# Patient Record
Sex: Male | Born: 1940
Health system: Southern US, Community
[De-identification: ages and names within clinical notes are randomized; demographics above are authoritative.]

## PROBLEM LIST (undated history)

## (undated) DIAGNOSIS — K219 Gastro-esophageal reflux disease without esophagitis: Secondary | ICD-10-CM

## (undated) DIAGNOSIS — M1711 Unilateral primary osteoarthritis, right knee: Secondary | ICD-10-CM

## (undated) DIAGNOSIS — C649 Malignant neoplasm of unspecified kidney, except renal pelvis: Secondary | ICD-10-CM

## (undated) DIAGNOSIS — E119 Type 2 diabetes mellitus without complications: Secondary | ICD-10-CM

## (undated) DIAGNOSIS — I219 Acute myocardial infarction, unspecified: Secondary | ICD-10-CM

## (undated) DIAGNOSIS — K5790 Diverticulosis of intestine, part unspecified, without perforation or abscess without bleeding: Secondary | ICD-10-CM

## (undated) DIAGNOSIS — G629 Polyneuropathy, unspecified: Secondary | ICD-10-CM

## (undated) DIAGNOSIS — Z7901 Long term (current) use of anticoagulants: Secondary | ICD-10-CM

## (undated) DIAGNOSIS — I480 Paroxysmal atrial fibrillation: Secondary | ICD-10-CM

## (undated) DIAGNOSIS — I499 Cardiac arrhythmia, unspecified: Secondary | ICD-10-CM

## (undated) DIAGNOSIS — Z9289 Personal history of other medical treatment: Secondary | ICD-10-CM

## (undated) DIAGNOSIS — M1990 Unspecified osteoarthritis, unspecified site: Secondary | ICD-10-CM

## (undated) DIAGNOSIS — I10 Essential (primary) hypertension: Secondary | ICD-10-CM

## (undated) DIAGNOSIS — I2510 Atherosclerotic heart disease of native coronary artery without angina pectoris: Secondary | ICD-10-CM

## (undated) DIAGNOSIS — M779 Enthesopathy, unspecified: Secondary | ICD-10-CM

## (undated) DIAGNOSIS — M1712 Unilateral primary osteoarthritis, left knee: Secondary | ICD-10-CM

## (undated) DIAGNOSIS — E785 Hyperlipidemia, unspecified: Secondary | ICD-10-CM

## (undated) DIAGNOSIS — D649 Anemia, unspecified: Secondary | ICD-10-CM

## (undated) DIAGNOSIS — J189 Pneumonia, unspecified organism: Secondary | ICD-10-CM

## (undated) DIAGNOSIS — I214 Non-ST elevation (NSTEMI) myocardial infarction: Secondary | ICD-10-CM

## (undated) DIAGNOSIS — R0602 Shortness of breath: Secondary | ICD-10-CM

## (undated) HISTORY — DX: Unilateral primary osteoarthritis, left knee: M17.12

## (undated) HISTORY — PX: OTHER SURGICAL HISTORY: SHX169

## (undated) HISTORY — DX: Hyperlipidemia, unspecified: E78.5

## (undated) HISTORY — PX: PILONIDAL CYST DRAINAGE: SHX743

## (undated) HISTORY — PX: JOINT REPLACEMENT: SHX530

## (undated) HISTORY — DX: Unspecified osteoarthritis, unspecified site: M19.90

---
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
## 1995-05-04 DIAGNOSIS — I219 Acute myocardial infarction, unspecified: Secondary | ICD-10-CM

## 1995-05-04 HISTORY — PX: CORONARY ANGIOPLASTY WITH STENT PLACEMENT: SHX49

## 1995-05-04 HISTORY — DX: Acute myocardial infarction, unspecified: I21.9

## 1995-05-04 HISTORY — PX: TOTAL SHOULDER REPLACEMENT: SUR1217

## 1999-04-16 ENCOUNTER — Ambulatory Visit (HOSPITAL_BASED_OUTPATIENT_CLINIC_OR_DEPARTMENT_OTHER): Admission: RE | Admit: 1999-04-16 | Discharge: 1999-04-16 | Payer: Self-pay | Admitting: General Surgery

## 2000-03-14 ENCOUNTER — Encounter: Payer: Self-pay | Admitting: Orthopedic Surgery

## 2000-03-16 ENCOUNTER — Encounter: Payer: Self-pay | Admitting: Orthopedic Surgery

## 2000-03-16 ENCOUNTER — Inpatient Hospital Stay (HOSPITAL_COMMUNITY): Admission: RE | Admit: 2000-03-16 | Discharge: 2000-03-17 | Payer: Self-pay | Admitting: Orthopedic Surgery

## 2001-07-20 ENCOUNTER — Ambulatory Visit (HOSPITAL_COMMUNITY): Admission: RE | Admit: 2001-07-20 | Discharge: 2001-07-20 | Payer: Self-pay | Admitting: Gastroenterology

## 2003-12-09 ENCOUNTER — Ambulatory Visit (HOSPITAL_COMMUNITY): Admission: RE | Admit: 2003-12-09 | Discharge: 2003-12-09 | Payer: Self-pay | Admitting: Orthopedic Surgery

## 2003-12-09 ENCOUNTER — Ambulatory Visit (HOSPITAL_BASED_OUTPATIENT_CLINIC_OR_DEPARTMENT_OTHER): Admission: RE | Admit: 2003-12-09 | Discharge: 2003-12-09 | Payer: Self-pay | Admitting: Orthopedic Surgery

## 2003-12-16 ENCOUNTER — Ambulatory Visit (HOSPITAL_COMMUNITY): Admission: RE | Admit: 2003-12-16 | Discharge: 2003-12-16 | Payer: Self-pay | Admitting: Orthopedic Surgery

## 2009-05-03 HISTORY — PX: CARDIAC CATHETERIZATION: SHX172

## 2009-05-03 HISTORY — PX: LUNG BIOPSY: SHX232

## 2009-07-18 ENCOUNTER — Encounter: Admission: RE | Admit: 2009-07-18 | Discharge: 2009-07-18 | Payer: Self-pay | Admitting: Internal Medicine

## 2009-08-13 ENCOUNTER — Ambulatory Visit (HOSPITAL_COMMUNITY): Admission: RE | Admit: 2009-08-13 | Discharge: 2009-08-13 | Payer: Self-pay | Admitting: Urology

## 2009-08-14 ENCOUNTER — Inpatient Hospital Stay (HOSPITAL_COMMUNITY): Admission: RE | Admit: 2009-08-14 | Discharge: 2009-08-19 | Payer: Self-pay | Admitting: Urology

## 2009-08-14 ENCOUNTER — Ambulatory Visit: Payer: Self-pay | Admitting: Surgery

## 2009-08-14 ENCOUNTER — Encounter: Payer: Self-pay | Admitting: Urology

## 2009-08-14 HISTORY — PX: NEPHRECTOMY: SHX65

## 2009-11-17 ENCOUNTER — Ambulatory Visit (HOSPITAL_COMMUNITY): Admission: RE | Admit: 2009-11-17 | Discharge: 2009-11-17 | Payer: Self-pay | Admitting: Urology

## 2010-03-05 ENCOUNTER — Ambulatory Visit: Payer: Self-pay | Admitting: Oncology

## 2010-03-23 LAB — URINALYSIS, MICROSCOPIC - CHCC
Bilirubin (Urine): NEGATIVE
Glucose: NEGATIVE g/dL
Ketones: NEGATIVE mg/dL
Specific Gravity, Urine: 1.02 (ref 1.003–1.035)
WBC, UA: NEGATIVE (ref 0–2)

## 2010-05-11 ENCOUNTER — Encounter
Admission: RE | Admit: 2010-05-11 | Discharge: 2010-05-11 | Payer: Self-pay | Source: Home / Self Care | Attending: Oncology | Admitting: Oncology

## 2010-05-12 ENCOUNTER — Ambulatory Visit: Payer: Self-pay | Admitting: Oncology

## 2010-05-22 ENCOUNTER — Other Ambulatory Visit: Payer: Self-pay | Admitting: Dermatology

## 2010-05-26 ENCOUNTER — Ambulatory Visit
Admission: RE | Admit: 2010-05-26 | Discharge: 2010-05-26 | Payer: Self-pay | Source: Home / Self Care | Attending: Oncology | Admitting: Oncology

## 2010-05-28 ENCOUNTER — Ambulatory Visit (HOSPITAL_COMMUNITY)
Admission: RE | Admit: 2010-05-28 | Discharge: 2010-05-29 | Payer: Self-pay | Source: Home / Self Care | Attending: Interventional Radiology | Admitting: Interventional Radiology

## 2010-05-28 LAB — CBC
HCT: 38.2 % — ABNORMAL LOW (ref 39.0–52.0)
Hemoglobin: 13.2 g/dL (ref 13.0–17.0)
MCV: 87.8 fL (ref 78.0–100.0)
RBC: 4.35 MIL/uL (ref 4.22–5.81)
WBC: 6 10*3/uL (ref 4.0–10.5)

## 2010-05-28 LAB — PROTIME-INR: INR: 1.03 (ref 0.00–1.49)

## 2010-05-28 LAB — APTT: aPTT: 35 seconds (ref 24–37)

## 2010-07-21 LAB — BASIC METABOLIC PANEL
CO2: 28 mEq/L (ref 19–32)
CO2: 28 mEq/L (ref 19–32)
Calcium: 7.2 mg/dL — ABNORMAL LOW (ref 8.4–10.5)
Calcium: 7.3 mg/dL — ABNORMAL LOW (ref 8.4–10.5)
Creatinine, Ser: 1.19 mg/dL (ref 0.4–1.5)
GFR calc Af Amer: >60 mL/min (ref >60)
GFR calc Af Amer: >60 mL/min (ref >60)
GFR calc non Af Amer: >60 mL/min (ref >60)
GFR calc non Af Amer: >60 mL/min (ref >60)
Glucose, Bld: 126 mg/dL — ABNORMAL HIGH (ref 70–99)
Potassium: 3.9 mEq/L (ref 3.5–5.1)
Sodium: 134 mEq/L — ABNORMAL LOW (ref 135–145)
Sodium: 138 mEq/L (ref 135–145)

## 2010-07-21 LAB — CBC
HCT: 24.9 % — ABNORMAL LOW (ref 39.0–52.0)
Hemoglobin: 8.1 g/dL — ABNORMAL LOW (ref 13.0–17.0)
Hemoglobin: 8.8 g/dL — ABNORMAL LOW (ref 13.0–17.0)
Hemoglobin: 9.3 g/dL — ABNORMAL LOW (ref 13.0–17.0)
MCHC: 32.5 g/dL (ref 30.0–36.0)
MCHC: 33 g/dL (ref 30.0–36.0)
MCV: 82.2 fL (ref 78.0–100.0)
RBC: 3.01 MIL/uL — ABNORMAL LOW (ref 4.22–5.81)
RBC: 3.28 MIL/uL — ABNORMAL LOW (ref 4.22–5.81)
RBC: 3.47 MIL/uL — ABNORMAL LOW (ref 4.22–5.81)
RDW: 17.4 % — ABNORMAL HIGH (ref 11.5–15.5)
RDW: 17.6 % — ABNORMAL HIGH (ref 11.5–15.5)
RDW: 17.7 % — ABNORMAL HIGH (ref 11.5–15.5)

## 2010-07-22 LAB — COMPREHENSIVE METABOLIC PANEL
ALT: 58 U/L — ABNORMAL HIGH (ref 0–53)
AST: 24 U/L (ref 0–37)
Albumin: 2.6 g/dL — ABNORMAL LOW (ref 3.5–5.2)
CO2: 27 mEq/L (ref 19–32)
Calcium: 9.3 mg/dL (ref 8.4–10.5)
Creatinine, Ser: 0.79 mg/dL (ref 0.4–1.5)
GFR calc Af Amer: >60 mL/min (ref >60)
GFR calc non Af Amer: >60 mL/min (ref >60)
Sodium: 138 mEq/L (ref 135–145)
Total Protein: 7.7 g/dL (ref 6.0–8.3)

## 2010-07-22 LAB — TYPE AND SCREEN

## 2010-07-22 LAB — CBC
Hemoglobin: 10.9 g/dL — ABNORMAL LOW (ref 13.0–17.0)
MCHC: 32.3 g/dL (ref 30.0–36.0)
MCHC: 32.9 g/dL (ref 30.0–36.0)
MCV: 79 fL (ref 78.0–100.0)
MCV: 81.8 fL (ref 78.0–100.0)
Platelets: 465 10*3/uL — ABNORMAL HIGH (ref 150–400)
RBC: 4.04 MIL/uL — ABNORMAL LOW (ref 4.22–5.81)
RDW: 17.9 % — ABNORMAL HIGH (ref 11.5–15.5)

## 2010-07-22 LAB — BASIC METABOLIC PANEL
CO2: 25 mEq/L (ref 19–32)
Calcium: 7.4 mg/dL — ABNORMAL LOW (ref 8.4–10.5)
Chloride: 105 mEq/L (ref 96–112)
GFR calc Af Amer: >60 mL/min (ref >60)
Potassium: 4.8 mEq/L (ref 3.5–5.1)
Sodium: 135 mEq/L (ref 135–145)

## 2010-07-22 LAB — POCT I-STAT 4, (NA,K, GLUC, HGB,HCT)
Glucose, Bld: 147 mg/dL — ABNORMAL HIGH (ref 70–99)
HCT: 28 % — ABNORMAL LOW (ref 39.0–52.0)
Sodium: 136 mEq/L (ref 135–145)

## 2010-07-22 LAB — ABO/RH: ABO/RH(D): A POS

## 2010-08-20 ENCOUNTER — Ambulatory Visit (HOSPITAL_COMMUNITY)
Admission: RE | Admit: 2010-08-20 | Discharge: 2010-08-20 | Disposition: A | Discharge status: Home / Self Care | Payer: Medicare Other | Source: Ambulatory Visit

## 2010-08-20 ENCOUNTER — Other Ambulatory Visit: Payer: Self-pay

## 2010-08-20 ENCOUNTER — Ambulatory Visit (HOSPITAL_COMMUNITY)
Admission: RE | Admit: 2010-08-20 | Discharge: 2010-08-20 | Disposition: A | Discharge status: Home / Self Care | Payer: Medicare Other | Source: Ambulatory Visit | Attending: Internal Medicine | Admitting: Internal Medicine

## 2010-08-20 DIAGNOSIS — C801 Malignant (primary) neoplasm, unspecified: Secondary | ICD-10-CM | POA: Insufficient documentation

## 2010-08-20 DIAGNOSIS — I080 Rheumatic disorders of both mitral and aortic valves: Secondary | ICD-10-CM | POA: Insufficient documentation

## 2010-08-20 DIAGNOSIS — Z01810 Encounter for preprocedural cardiovascular examination: Secondary | ICD-10-CM | POA: Insufficient documentation

## 2010-09-18 NOTE — Op Note (Signed)
[place]. [facility]  Patient:    Donald Gray, Donald Gray Visit Number: [id number] MRN: [id number]          Service Type: END Location: ENDO Attending Physician:  Dennison Bulla Ii Dictated by:   Verlin Grills, M.D. Proc. Date: 07/20/01 Admit Date:  07/20/2001 Discharge Date: 07/20/2001   CC:         Thora Lance, M.D.   Operative Report  PROCEDURE:  Screening colonoscopy.  INDICATIONS:  Donald Gray is a 70 year old male born 01/29/1941. Donald Gray is referred for his first screening colonoscopy with polypectomy to prevent colon cancer.  ENDOSCOPIST:  Charolett Bumpers III, M.D.  MEDICATIONS:  Versed 10 mg, Fentanyl 50 mcg.  ENDOSCOPE:  Olympus pediatric colonoscope.  DESCRIPTION OF PROCEDURE:  After obtaining informed consent, Donald Gray was placed in the left lateral decubitus position.  I administered intravenous Fentanyl and intravenous Versed to achieve conscious sedation for the procedure.  The patients blood pressure, oxygen saturation and cardiac rhythm were monitored throughout the procedure and documented in the medical record. Anal inspection was normal.  Digital rectal exam was normal.  The Olympus pediatric video colonoscope was introduced into the rectum and easily advanced to the cecum.  Colonic preparation for the exam today was excellent.  Rectum was normal.  Sigmoid colon and descending colon with left colonic diverticulosis.  Splenic flexure normal.  Transverse colon normal.  Hepatic flexure normal.  Ascending colon normal.  Cecum and ileocecal valve normal.  ASSESSMENT:  Left colonic diverticulosis; otherwise normal proctocolonoscopy of the cecum.  No endoscopic evidence for the presence of colorectal neoplasia.  RECOMMENDATIONS:  Repeat colonoscopy in 10 years. Dictated by:   Verlin Grills, M.D. Attending Physician:  Dennison Bulla Ii DD:  07/20/01 TD:  07/22/01 Job:  [id number]

## 2010-09-18 NOTE — Op Note (Signed)
Donald Gray, Donald Gray                     ACCOUNT NO.:  [id number]   MEDICAL RECORD NO.:  [id number]                   PATIENT TYPE:  AMB   LOCATION:  DSC                                  FACILITY:  [facility]   PHYSICIAN:  Robert A. Thurston Hole, M.D.              DATE OF BIRTH:  [birth year]   DATE OF PROCEDURE:  12/09/2003  DATE OF DISCHARGE:                                 OPERATIVE REPORT   PREOPERATIVE DIAGNOSIS:  Right knee medial meniscal tear.   POSTOPERATIVE DIAGNOSIS:  Right knee medial and lateral meniscal tears with  chondromalacia and synovitis.   OPERATION PERFORMED:  1. Right knee examination under anesthesia followed by arthroscopic partial     medial and lateral meniscectomies.  2. Right knee chondroplasty with partial synovectomy.   SURGEON:  Elana Alm. Thurston Hole, M.D.   ANESTHESIA:  Local with MAC.   OPERATIVE TIME:  30 minutes.   COMPLICATIONS:  None.   INDICATIONS FOR PROCEDURE:  Mr. Canizalez is a 70 year old gentleman who  has had significant pain in his knee for the past two months secondary to a  twisting injury.  Pain on exam consistent with medial meniscal tear  documented by MRI, who has failed conservative care and is now to undergo  arthroscopy.   DESCRIPTION OF PROCEDURE:  Mr. Gunby was brought to the operating room  on December 09, 2003 after a knee block was placed in the holding room.  He  was placed on the operating table in supine position.  His right knee was  examined under anesthesia.  Range of motion 0 to 125 degrees, 1 to 2+  crepitation, knee stable to ligamentous exam with normal patellar tracking.  The right knee was prepped using sterile DuraPrep and draped using sterile  technique.  Originally through an anterolateral portal, the arthroscope with  a pump attached was placed and through an anteromedial portal an  arthroscopic probe was placed.  On initial inspection of the medial  compartment, he had 25% grade 3 chondromalacia which  was debrided.  Medial  meniscus showed tearing of the posterior medial horn of which 50% was  resected back to a stable rim.  ACL and PCL were normal.  Lateral  compartment showed 15% grade 3 chondromalacia which was debrided.  Lateral  meniscal tear showed a posterior medial root tear 20% which was resected  back to a stable rim.  Patellofemoral joint showed 30 to 40% grade 3  chondromalacia on the femoral groove which was debrided, 25% changes on the  patella which was debrided.  The patella tracked normally.  Significant  synovitis in the medial and lateral gutters was debrided.  Otherwise they  were free of pathology.  After this was done, it was felt that all pathology  had been satisfactorily addressed.  The instruments were removed.  Portals  were closed with 3-0 nylon suture and injected with 0.25% Marcaine with  epinephrine and 4 mg of morphine.  Sterile dressing was applied.  The  patient was awakened and taken to recovery room in stable condition.   FOLLOW UP:  Mr. Pudlo will be followed as a outpatient on Vicodin and  Naprosyn.  See him back in the office in a week for suture removal anf  follow-up.                                               Robert A. Thurston Hole, M.D.    RAW/MEDQ  D:  12/09/2003  T:  12/09/2003  Job:  [id number]

## 2010-09-18 NOTE — Op Note (Signed)
[place]. [facility]  Patient:    Donald Gray, Donald Gray                  MRN: [id number] Proc. Date: 03/16/00 Adm. Date:  [id number] Attending:  Twana First                           Operative Report  PREOPERATIVE DIAGNOSIS:  Right shoulder degenerative joint disease.  POSTOPERATIVE DIAGNOSIS:  Right shoulder degenerative joint disease.  PROCEDURE:  Right total shoulder replacement using [employer] system with humeral component, 10 mm distal tip cemented with 45 mm x 15 mm/offset humeral head with #9 glenoid component - cemented.  SURGEON:  Elana Alm. Thurston Hole, M.D.  ASSISTANT:  Loreta Ave, M.D., Kirstin A. Shepperson, P.A.  ANESTHESIA:  General.  OPERATIVE TIME:  Two hours and 15 minutes.  COMPLICATIONS:  None.  DESCRIPTION OF PROCEDURE:  Mr. Bergland was brought to the operating room on March 16, 2000, placed on the operative table in the supine position. Initially prior to being placed under general anesthesia, he had a supraclavicular block placed for postoperative pain control.  He was then placed under general anesthesia.  He received vancomycin 1 g IV preoperatively for prophylaxis.  His right shoulder was examined under anesthesia.  He had forward flexion of 150, abduction of 140, internal and external rotation of 50 degrees with 2+ gravitation.  The shoulder was stable, ligamentous exam.  He was then placed in the beach-chair position and his shoulder and arm were prepped using sterile Betadine and draped using sterile technique.  Initially a 10 cm deltopectoral incision was made.  The underlying subcutaneous tissues were incised in line with the skin incision.  The cephalic vein was found and the delta pectoral interval was exposed carefully protecting the cephalic vein.  The underlying conjoint tendon was retracted, carefully protecting the musculocutaneous nerve and the underlying subscapularis tendon was exposed.  The  tendon was released 1 cm medial to the bicipital groove to developing superior and inferior limbs and tagging these with #1 Ti-Cron suture.  After this was done, then the anterior aspect of the shoulder joint was visualized.  The shoulder was subluxed anteriorly.  He was found to have significant grade 3 and 4 DJD in the humeral head.  Retractors were carefully placed while a humeral neck cut was made using the guide from the shoulder system and the appropriate amount of retroversion and inclination.  The humeral head was then removed.  The glenoid was exposed.  Retractors carefully placed.  Grade 4 changes noted in and around most of the glenoid surface. Sequential reamers were used to ream off the remaining articular surfaces on the glenoid.  The glenoid itself was sized.  A #9 was felt to be the appropriate size and a #9 glenoid jig was placed and these locking holes:  One superior, one inferior and two in the center were made using the standard technique.  After this was done, then the glenoid trial, #9 was placed with the PEGs in the locking holes with excellent fixation.  The proximal humerus was then exposed and sequential hand reamers were used to ream to a 12 mm size and then sequential broaching to a 10 mm size was done and placed with the humeral broach left in place as a trial, and then a 45 mm in diameter x 15 mm thickness with a 4 mm offset humeral head was placed on the  humeral neck.  The shoulder was reduced, taken through a range of motion of 170 degrees of forward flexion, 150 degrees of abduction with 80 degrees of internal and external rotation with excellent articulation with the glenoid trial component.  After this was done, then the humeral trial components were removed as well as the glenoid trial component.  At this point then, retractors were carefully placed back in the glenoid area and then the #9 glenoid component with cement backing was then placed down into  position and secured against the glenoid while the cement hardened with all excess cement being removed from around the edges.  After this was done then the humeral canal was tested and measured for a cement plug.  A #2 was found to be the appropriate size and this was placed and then the humeral canal was jet lavage irrigated with 2 L of saline solution and then filled with cement and then the 10 mm humeral component was placed down the humeral canal with excellent fit in excellent version, retroversion, and held in place again with excess cement being removed from around the edges.  After this was done and the cement hardened, then the humeral head, 45 mm in x 15 mm with a 4 mm offset was placed and hammered into position with an excellent Morse taper fit on the humeral neck and then the shoulder reduced again taken through excellent range of motion and found to be stable.  At this point it was felt that all the components were of excellent size, fit, and stability.  The wound was thoroughly irrigated.  The subscapularis tendon and anterior joint capsule was reattached to its lesser tuberosity attachments with #1 Tycron suture.  After this was done the shoulder could be brought up to 160 to 170 degrees of flexion, 150 degrees of abduction, and up to 60-80 degrees of external rotation and 90 degrees of internal rotation before any excessive tension was found on the subscapularis tendon repair.  The wound was again irrigated and then the delta pectoral fascia closed with 0 Vicryl, subcutaneous tissues closed with 2-0 Vicryl, skin closed with skin staples.  Sterile dressings were applied and a sling and then the patient awakened and taken to recovery room in stable condition.  Needle and sponge counts correct x 2 at the end of the case. DD:  03/16/00 TD:  03/16/00 Job: [id number]

## 2010-10-01 ENCOUNTER — Other Ambulatory Visit: Payer: Self-pay | Admitting: Internal Medicine

## 2010-10-01 ENCOUNTER — Ambulatory Visit (HOSPITAL_COMMUNITY): Payer: Medicare Other | Attending: Internal Medicine

## 2010-10-01 DIAGNOSIS — E871 Hypo-osmolality and hyponatremia: Secondary | ICD-10-CM | POA: Insufficient documentation

## 2010-10-01 LAB — BASIC METABOLIC PANEL
BUN: 22 mg/dL (ref 6–23)
Chloride: 89 mEq/L — ABNORMAL LOW (ref 96–112)
Creatinine, Ser: 1.17 mg/dL (ref 0.4–1.5)
GFR calc non Af Amer: >60 mL/min (ref >60)
Glucose, Bld: 103 mg/dL — ABNORMAL HIGH (ref 70–99)
Potassium: 3.8 mEq/L (ref 3.5–5.1)

## 2011-03-17 ENCOUNTER — Emergency Department (HOSPITAL_COMMUNITY): Payer: Medicare Other

## 2011-03-17 ENCOUNTER — Emergency Department (HOSPITAL_COMMUNITY)
Admission: EM | Admit: 2011-03-17 | Discharge: 2011-03-17 | Disposition: A | Discharge status: Home / Self Care | Payer: Medicare Other | Attending: Emergency Medicine | Admitting: Emergency Medicine

## 2011-03-17 ENCOUNTER — Encounter: Payer: Self-pay | Admitting: *Deleted

## 2011-03-17 DIAGNOSIS — M542 Cervicalgia: Secondary | ICD-10-CM | POA: Insufficient documentation

## 2011-03-17 DIAGNOSIS — Z7982 Long term (current) use of aspirin: Secondary | ICD-10-CM | POA: Insufficient documentation

## 2011-03-17 DIAGNOSIS — M25519 Pain in unspecified shoulder: Secondary | ICD-10-CM

## 2011-03-17 DIAGNOSIS — Z79899 Other long term (current) drug therapy: Secondary | ICD-10-CM | POA: Insufficient documentation

## 2011-03-17 HISTORY — DX: Atherosclerotic heart disease of native coronary artery without angina pectoris: I25.10

## 2011-03-17 HISTORY — DX: Essential (primary) hypertension: I10

## 2011-03-17 MED ORDER — IBUPROFEN 600 MG PO TABS: 600.0000 mg | ORAL_TABLET | Freq: Four times a day (QID) | ORAL | Status: AC | PRN
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
## 2011-03-17 NOTE — Progress Notes (Signed)
Chaplain Note:  Chaplain responded immediately to page received at 16:03 and provided spiritual comfort and support to pt.  At pt's request, chaplain phoned pt's wife to inform her that pt was being treated at [facility].  No further needs were expressed.  Patient was soon discharged.  Verdie Shire, chaplain resident [phone])
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
## 2011-08-02 ENCOUNTER — Encounter (INDEPENDENT_AMBULATORY_CARE_PROVIDER_SITE_OTHER): Payer: Self-pay | Admitting: Surgery

## 2011-08-16 ENCOUNTER — Encounter (INDEPENDENT_AMBULATORY_CARE_PROVIDER_SITE_OTHER): Payer: Self-pay | Admitting: Surgery

## 2011-08-17 ENCOUNTER — Encounter (INDEPENDENT_AMBULATORY_CARE_PROVIDER_SITE_OTHER): Payer: Self-pay | Admitting: Surgery

## 2011-08-17 ENCOUNTER — Ambulatory Visit (INDEPENDENT_AMBULATORY_CARE_PROVIDER_SITE_OTHER): Payer: Medicare Other | Admitting: Surgery

## 2011-08-17 ENCOUNTER — Encounter (INDEPENDENT_AMBULATORY_CARE_PROVIDER_SITE_OTHER): Payer: Self-pay | Admitting: General Surgery

## 2011-08-17 VITALS — BP 140/80 | HR 80 | Temp 98.2°F | Ht 71.0 in | Wt 218.8 lb

## 2011-08-17 DIAGNOSIS — K4020 Bilateral inguinal hernia, without obstruction or gangrene, not specified as recurrent: Secondary | ICD-10-CM | POA: Insufficient documentation

## 2011-08-17 NOTE — Patient Instructions (Signed)
We will obtain cardiac clearance from Dr. Katrinka Blazing and will coordinate scheduling with Dr. Lenoria Chime office.   Stop your aspirin 5 days prior to surgery.

## 2011-08-17 NOTE — Progress Notes (Signed)
Patient ID: Donald Gray, male   DOB: December 25, 1940, 71 y.o.   MRN: [id number]  Chief Complaint  Patient presents with  . Pre-op Exam    eval BIH    HPI Donald Gray is a 71 y.o. male.  Referred by Dr. Marcine Matar for evaluation of bilateral inguinal hernias. HPI Donald Gray is a 71 year old male who is status post right radical nephrectomy in 2011 for a large renal cell carcinoma. He had a pulmonary recurrence of his renal cell carcinoma and has undergone extensive treatment in [place]. Currently he has no evidence of disease. He has had a recent CT scan which showed no sign of recurrent disease. He recently was evaluated by Dr. Retta Diones for left testicular nodularity. He also has a left hydrocele. On examination as well as the recent CT scan he has bilateral inguinal hernias containing only fat. No sign of involvement of the intestine. He is now referred for surgical evaluation. Dr. Retta Diones is hoping to coordinate the procedure so that he can perform a left inguinal exploration and biopsy/possible left orchiectomy and left hydrocelectomy. Past Medical History  Diagnosis Date  . Hypertension   . Renal disorder   . Cancer     pt with left kidney ca with resultant nephrectomy and mets to the lungs.  . Coronary artery disease   . Arthritis   . Blood transfusion   . Hyperlipidemia   . Heart attack     Past Surgical History  Procedure Date  . Nephrectomy   . Cardiac surgery   . Knee surgery   . Pilonidal cyst drainage   . Shoulder surgery 1997    right shoulder  . Kidney surgery 12/14/2009    removed right kidney    Family History  Problem Relation Age of Onset  . Heart disease Father     Social History History  Substance Use Topics  . Smoking status: Former Games developer  . Smokeless tobacco: Former Neurosurgeon    Quit date: 08/16/1956  . Alcohol Use: Yes    Allergies  Allergen Reactions  . Penicillins   . Other     No steroids while on "IL2" therapy     Current Outpatient Prescriptions  Medication Sig Dispense Refill  . aspirin 325 MG tablet Take 325 mg by mouth daily.        Marland Kitchen atorvastatin (LIPITOR) 10 MG tablet Take 10 mg by mouth daily.        Marland Kitchen azelastine (ASTELIN) 137 MCG/SPRAY nasal spray Place 1 spray into the nose 2 (two) times daily as needed. For congestion; Use in each nostril as directed       . Coenzyme Q10 (CO Q 10) 100 MG CAPS Take 1 tablet by mouth daily.        . fexofenadine (ALLEGRA) 180 MG tablet Take 180 mg by mouth daily.        . fluticasone (FLONASE) 50 MCG/ACT nasal spray Place 2 sprays into the nose daily. Do not use while on "IL2"       . glucosamine-chondroitin 500-400 MG tablet Take 2 tablets by mouth daily.        Marland Kitchen lisinopril (PRINIVIL,ZESTRIL) 20 MG tablet Take 20 mg by mouth daily as needed. For raised BP while on "IL2"       . magnesium gluconate (MAGONATE) 500 MG tablet Take 500 mg by mouth 2 (two) times daily.        . metoprolol (TOPROL-XL) 50 MG 24 hr tablet Take 50 mg by  mouth daily.       . Multiple Vitamins-Minerals (MULTIVITAMINS THER. W/MINERALS) TABS Take 1 tablet by mouth daily.        . Omega-3 Fatty Acids (FISH OIL) 1200 MG CAPS Take 1 capsule by mouth daily.        Marland Kitchen oxymetazoline (NASAL) 0.05 % nasal spray Place 2 sprays into the nose 2 (two) times daily.        . vitamin C (ASCORBIC ACID) 500 MG tablet Take 500 mg by mouth daily.          Review of Systems Review of Systems  Constitutional: Negative for fever, chills and unexpected weight change.  HENT: Negative for hearing loss, congestion, sore throat, trouble swallowing and voice change.   Eyes: Negative for visual disturbance.  Respiratory: Negative for cough and wheezing.   Cardiovascular: Negative for chest pain, palpitations and leg swelling.  Gastrointestinal: Negative for nausea, vomiting, abdominal pain, diarrhea, constipation, blood in stool, abdominal distention, anal bleeding and rectal pain.  Genitourinary: Positive for  scrotal swelling and testicular pain. Negative for hematuria and difficulty urinating.  Musculoskeletal: Negative for arthralgias.  Skin: Negative for rash and wound.  Neurological: Negative for seizures, syncope, weakness and headaches.  Hematological: Negative for adenopathy. Does not bruise/bleed easily.  Psychiatric/Behavioral: Negative for confusion.    Blood pressure 140/80, pulse 80, temperature 98.2 F (36.8 C), temperature source Temporal, height 5\' 11"  (1.803 m), weight 218 lb 12.8 oz (99.247 kg), SpO2 98.00%.  Physical Exam Physical Exam WDWN in NAD HEENT:  EOMI, sclera anicteric Neck:  No masses, no thyromegaly Lungs:  CTA bilaterally; normal respiratory effort CV:  Regular rate and rhythm; no murmurs Abd:  +bowel sounds, soft, non-tender, no masses GU:  Bilateral descended testes; nodularity of the left paratesticular structures Bilateral inguinal hernias - left greater than right; reducible; relatively small Ext:  Well-perfused; no edema Skin:  Warm, dry; no sign of jaundice  Data Reviewed CT [facility]  Assessment    Bilateral inguinal hernias - reducible    Plan    Bilateral inguinal hernia repairs with mesh - will coordinate with Dr. Retta Diones for his portion of the procedure.  We will first obtain cardiac clearance from Dr. Katrinka Blazing, and then will coordinate the procedure with Urology.  The surgical procedure has been discussed with the patient.  Potential risks, benefits, alternative treatments, and expected outcomes have been explained.  All of the patient's questions at this time have been answered.  The likelihood of reaching the patient's treatment goal is good.  The patient understand the proposed surgical procedure and wishes to proceed.        Bridgette Wolden K. 08/17/2011, 5:45 PM

## 2011-08-24 ENCOUNTER — Telehealth (INDEPENDENT_AMBULATORY_CARE_PROVIDER_SITE_OTHER): Payer: Self-pay | Admitting: General Surgery

## 2011-08-24 NOTE — Telephone Encounter (Signed)
Called pt today to let him know that I have gotten his cardiac clearance from Dr Katrinka Blazing and he needed to stop his ASA 5 days before surgery and our scheduler will be calling him this week to get him schedule

## 2011-09-03 ENCOUNTER — Other Ambulatory Visit: Payer: Self-pay | Admitting: Urology

## 2011-09-10 ENCOUNTER — Encounter (HOSPITAL_COMMUNITY): Payer: Self-pay | Admitting: Pharmacy Technician

## 2011-09-15 ENCOUNTER — Encounter (HOSPITAL_COMMUNITY): Payer: Self-pay

## 2011-09-15 ENCOUNTER — Encounter (HOSPITAL_COMMUNITY)
Admission: RE | Admit: 2011-09-15 | Discharge: 2011-09-15 | Disposition: A | Discharge status: Home / Self Care | Payer: Medicare Other | Source: Ambulatory Visit | Attending: Surgery | Admitting: Surgery

## 2011-09-15 HISTORY — DX: Diverticulosis of intestine, part unspecified, without perforation or abscess without bleeding: K57.90

## 2011-09-15 HISTORY — DX: Gastro-esophageal reflux disease without esophagitis: K21.9

## 2011-09-15 HISTORY — DX: Shortness of breath: R06.02

## 2011-09-15 HISTORY — DX: Cardiac arrhythmia, unspecified: I49.9

## 2011-09-15 HISTORY — DX: Anemia, unspecified: D64.9

## 2011-09-15 LAB — BASIC METABOLIC PANEL
Calcium: 9 mg/dL (ref 8.4–10.5)
Chloride: 106 mEq/L (ref 96–112)
Creatinine, Ser: 1.63 mg/dL — ABNORMAL HIGH (ref 0.50–1.35)
GFR calc Af Amer: 48 mL/min — ABNORMAL LOW (ref >90)
Sodium: 137 mEq/L (ref 135–145)

## 2011-09-15 LAB — CBC
MCH: 30 pg (ref 26.0–34.0)
MCV: 88.1 fL (ref 78.0–100.0)
Platelets: 222 10*3/uL (ref 150–400)
RDW: 13 % (ref 11.5–15.5)
WBC: 6.1 10*3/uL (ref 4.0–10.5)

## 2011-09-15 LAB — SURGICAL PCR SCREEN
MRSA, PCR: NEGATIVE
Staphylococcus aureus: NEGATIVE

## 2011-09-15 NOTE — Pre-Procedure Instructions (Signed)
09/15/11 Received LOV note of 06/10/10 from [facility] and placed on chart

## 2011-09-15 NOTE — Progress Notes (Signed)
09/15/11 1202  OBSTRUCTIVE SLEEP APNEA  Have you ever been diagnosed with sleep apnea through a sleep study? No  Do you snore loudly (loud enough to be heard through closed doors)?  0  Do you often feel tired, fatigued, or sleepy during the daytime? 0  Has anyone observed you stop breathing during your sleep? 0  Do you have, or are you being treated for high blood pressure? 1  BMI more than 35 kg/m2? 0  Age over 71 years old? 1  Neck circumference greater than 40 cm/18 inches? 1  Gender: 1  Obstructive Sleep Apnea Score 4   Score 4 or greater  Updated health history

## 2011-09-15 NOTE — Pre-Procedure Instructions (Signed)
09/15/11 Correction of CReatinine was 1.63 on preop labs of 09/15/11.  Given to Victorino Dike, nurse at Dr Dahlstedt's office.

## 2011-09-15 NOTE — Patient Instructions (Signed)
[address] [place] [place]  09/15/2011   Your procedure is scheduled on:  09/22/11 1025am-1255pm  Report to [facility] at 0800 AM.  Call this number if you have problems the morning of surgery: [phone]   Remember:   Do not eat food:After Midnight.  May have clear liquids:until Midnight .   Take these medicines the morning of surgery with A SIP OF WATER:    Do not wear jewelry,  Do not wear lotions, powders, or perfumes..  Do not shave 48 hours prior to surgery. Men may shave face and neck.  Do not bring valuables to the hospital.  Contacts, dentures or bridgework may not be worn into surgery.  Leave suitcase in the car. After surgery it may be brought to your room.  For patients admitted to the hospital, checkout time is 11:00 AM the day of discharge.    Special Instructions: CHG Shower Use Special Wash: 1/2 bottle night before surgery and 1/2 bottle morning of surgery. shower chin to toes with CHG.  Wash face and private parts with regular soap.     Please read over the following fact sheets that you were given: MRSA Information, coughing and deep breathing exercises

## 2011-09-15 NOTE — Pre-Procedure Instructions (Signed)
EKG 06/28/11 on chart  LOV note- Dr Verdis Prime 08/18/11 on chart  [facility] Note on chart 07/29/11

## 2011-09-15 NOTE — Pre-Procedure Instructions (Signed)
EKG 06/28/11 on chart CHest CT 08/09/11 on chart and CT of abdomen and pelvis on chart.

## 2011-09-15 NOTE — Pre-Procedure Instructions (Signed)
09/15/11 Requested most recent office visit, ekg, stress test, echo and cxr from [facility].  Sent Medical Release form

## 2011-09-15 NOTE — Pre-Procedure Instructions (Signed)
09/15/11 Victorino Dike, nurse at office of Dr Retta Diones was given results of BMET of 09/15/11 of BUN 31, Creatinine 1.61 and Glucose 162.  Nurse stated she would report to Dr Retta Diones.  Also routed BMET results in EPIC to Dr Corliss Skains.

## 2011-09-17 NOTE — Pre-Procedure Instructions (Signed)
Discharge Summary 06/10/10 from [facility] for cardiac catheterization  Cath report on chart done 06/10/10

## 2011-09-22 ENCOUNTER — Ambulatory Visit (HOSPITAL_COMMUNITY): Payer: Medicare Other | Admitting: Anesthesiology

## 2011-09-22 ENCOUNTER — Encounter (HOSPITAL_COMMUNITY): Payer: Self-pay | Admitting: Anesthesiology

## 2011-09-22 ENCOUNTER — Ambulatory Visit (HOSPITAL_COMMUNITY)
Admission: RE | Admit: 2011-09-22 | Discharge: 2011-09-23 | Disposition: A | Discharge status: Home / Self Care | Payer: Medicare Other | Source: Ambulatory Visit | Attending: Surgery | Admitting: Surgery

## 2011-09-22 ENCOUNTER — Encounter (HOSPITAL_COMMUNITY)
Admission: RE | Disposition: A | Discharge status: Home / Self Care | Payer: Self-pay | Source: Ambulatory Visit | Attending: Surgery

## 2011-09-22 ENCOUNTER — Encounter (HOSPITAL_COMMUNITY): Payer: Self-pay | Admitting: *Deleted

## 2011-09-22 DIAGNOSIS — K4020 Bilateral inguinal hernia, without obstruction or gangrene, not specified as recurrent: Secondary | ICD-10-CM

## 2011-09-22 DIAGNOSIS — I252 Old myocardial infarction: Secondary | ICD-10-CM | POA: Insufficient documentation

## 2011-09-22 DIAGNOSIS — K219 Gastro-esophageal reflux disease without esophagitis: Secondary | ICD-10-CM | POA: Insufficient documentation

## 2011-09-22 DIAGNOSIS — L988 Other specified disorders of the skin and subcutaneous tissue: Secondary | ICD-10-CM | POA: Insufficient documentation

## 2011-09-22 DIAGNOSIS — N508 Other specified disorders of male genital organs: Secondary | ICD-10-CM | POA: Insufficient documentation

## 2011-09-22 DIAGNOSIS — Z905 Acquired absence of kidney: Secondary | ICD-10-CM | POA: Insufficient documentation

## 2011-09-22 DIAGNOSIS — I10 Essential (primary) hypertension: Secondary | ICD-10-CM | POA: Insufficient documentation

## 2011-09-22 DIAGNOSIS — E785 Hyperlipidemia, unspecified: Secondary | ICD-10-CM | POA: Insufficient documentation

## 2011-09-22 DIAGNOSIS — E119 Type 2 diabetes mellitus without complications: Secondary | ICD-10-CM | POA: Insufficient documentation

## 2011-09-22 DIAGNOSIS — Z85528 Personal history of other malignant neoplasm of kidney: Secondary | ICD-10-CM | POA: Insufficient documentation

## 2011-09-22 HISTORY — PX: INGUINAL HERNIA REPAIR: SUR1180

## 2011-09-22 HISTORY — PX: INGUINAL HERNIA REPAIR: SHX194

## 2011-09-22 HISTORY — PX: ORCHIECTOMY: SHX2116

## 2011-09-22 LAB — GLUCOSE, CAPILLARY: Glucose-Capillary: 109 mg/dL — ABNORMAL HIGH (ref 70–99)

## 2011-09-22 SURGERY — REPAIR, HERNIA, INGUINAL, BILATERAL, ADULT
Anesthesia: General | Site: Groin | Laterality: Left | Wound class: Clean

## 2011-09-22 MED ORDER — PROMETHAZINE HCL 25 MG/ML IJ SOLN: 6.2500 mg | INTRAMUSCULAR | Status: DC | PRN

## 2011-09-22 MED ORDER — DOCUSATE SODIUM 100 MG PO CAPS
100.0000 mg | ORAL_CAPSULE | Freq: Two times a day (BID) | ORAL | Status: DC
  Administered 2011-09-22 – 2011-09-23 (×2): 100 mg via ORAL
  Filled 2011-09-22 (×3): qty 1

## 2011-09-22 MED ORDER — EPHEDRINE SULFATE 50 MG/ML IJ SOLN
INTRAMUSCULAR | Status: DC | PRN
  Administered 2011-09-22: 5 mg via INTRAVENOUS
  Administered 2011-09-22 (×2): 7.5 mg via INTRAVENOUS

## 2011-09-22 MED ORDER — AZELASTINE HCL 0.1 % NA SOLN
1.0000 | Freq: Two times a day (BID) | NASAL | Status: DC | PRN
  Filled 2011-09-22: qty 30

## 2011-09-22 MED ORDER — PROPOFOL 10 MG/ML IV EMUL
INTRAVENOUS | Status: DC | PRN
  Administered 2011-09-22: 50 mg via INTRAVENOUS
  Administered 2011-09-22: 100 mg via INTRAVENOUS

## 2011-09-22 MED ORDER — CEFAZOLIN SODIUM 1-5 GM-% IV SOLN
1.0000 g | Freq: Four times a day (QID) | INTRAVENOUS | Status: DC
  Filled 2011-09-22 (×3): qty 50

## 2011-09-22 MED ORDER — OXYCODONE-ACETAMINOPHEN 5-325 MG PO TABS
1.0000 | ORAL_TABLET | ORAL | Status: DC | PRN
  Filled 2011-09-22: qty 2

## 2011-09-22 MED ORDER — BUPIVACAINE-EPINEPHRINE 0.25% -1:200000 IJ SOLN
INTRAMUSCULAR | Status: AC
  Filled 2011-09-22: qty 1

## 2011-09-22 MED ORDER — MAGNESIUM GLUCONATE 500 MG PO TABS
500.0000 mg | ORAL_TABLET | Freq: Two times a day (BID) | ORAL | Status: DC
  Administered 2011-09-22 – 2011-09-23 (×3): 500 mg via ORAL
  Filled 2011-09-22 (×4): qty 1

## 2011-09-22 MED ORDER — MORPHINE SULFATE 2 MG/ML IJ SOLN
2.0000 mg | INTRAMUSCULAR | Status: DC | PRN
  Administered 2011-09-22 – 2011-09-23 (×5): 2 mg via INTRAVENOUS
  Filled 2011-09-22 (×5): qty 1

## 2011-09-22 MED ORDER — VANCOMYCIN HCL IN DEXTROSE 1-5 GM/200ML-% IV SOLN
1000.0000 mg | INTRAVENOUS | Status: AC
  Administered 2011-09-22: 1000 mg via INTRAVENOUS

## 2011-09-22 MED ORDER — DIPHENHYDRAMINE HCL 50 MG/ML IJ SOLN
INTRAMUSCULAR | Status: AC
  Filled 2011-09-22: qty 1

## 2011-09-22 MED ORDER — LACTATED RINGERS IV SOLN: INTRAVENOUS | Status: DC

## 2011-09-22 MED ORDER — FENTANYL CITRATE 0.05 MG/ML IJ SOLN
INTRAMUSCULAR | Status: DC | PRN
  Administered 2011-09-22 (×2): 50 ug via INTRAVENOUS
  Administered 2011-09-22: 100 ug via INTRAVENOUS

## 2011-09-22 MED ORDER — KETOROLAC TROMETHAMINE 15 MG/ML IJ SOLN
15.0000 mg | Freq: Four times a day (QID) | INTRAMUSCULAR | Status: DC
  Administered 2011-09-22 – 2011-09-23 (×4): 15 mg via INTRAVENOUS
  Filled 2011-09-22 (×7): qty 1

## 2011-09-22 MED ORDER — ONDANSETRON HCL 4 MG/2ML IJ SOLN
INTRAMUSCULAR | Status: DC | PRN
  Administered 2011-09-22: 4 mg via INTRAVENOUS

## 2011-09-22 MED ORDER — METOPROLOL SUCCINATE ER 25 MG PO TB24
25.0000 mg | ORAL_TABLET | Freq: Two times a day (BID) | ORAL | Status: DC
  Administered 2011-09-22 – 2011-09-23 (×2): 25 mg via ORAL
  Filled 2011-09-22 (×4): qty 1

## 2011-09-22 MED ORDER — LISINOPRIL 20 MG PO TABS
20.0000 mg | ORAL_TABLET | Freq: Two times a day (BID) | ORAL | Status: DC
  Administered 2011-09-22 – 2011-09-23 (×3): 20 mg via ORAL
  Filled 2011-09-22 (×4): qty 1

## 2011-09-22 MED ORDER — ETOMIDATE 2 MG/ML IV SOLN
INTRAVENOUS | Status: DC | PRN
  Administered 2011-09-22: 10 mg via INTRAVENOUS

## 2011-09-22 MED ORDER — HYDROMORPHONE HCL PF 1 MG/ML IJ SOLN
0.2500 mg | INTRAMUSCULAR | Status: DC | PRN
  Administered 2011-09-22 (×2): 0.5 mg via INTRAVENOUS

## 2011-09-22 MED ORDER — LACTATED RINGERS IV SOLN
INTRAVENOUS | Status: DC
  Administered 2011-09-22: 1000 mL via INTRAVENOUS
  Administered 2011-09-22 (×2): via INTRAVENOUS

## 2011-09-22 MED ORDER — FLUTICASONE PROPIONATE 50 MCG/ACT NA SUSP
2.0000 | Freq: Every day | NASAL | Status: DC
  Administered 2011-09-22 – 2011-09-23 (×2): 2 via NASAL
  Filled 2011-09-22: qty 16

## 2011-09-22 MED ORDER — MEPERIDINE HCL 50 MG/ML IJ SOLN: 6.2500 mg | INTRAMUSCULAR | Status: DC | PRN

## 2011-09-22 MED ORDER — CISATRACURIUM BESYLATE (PF) 10 MG/5ML IV SOLN
INTRAVENOUS | Status: DC | PRN
  Administered 2011-09-22: 2 mg via INTRAVENOUS
  Administered 2011-09-22: 10 mg via INTRAVENOUS

## 2011-09-22 MED ORDER — KETOROLAC TROMETHAMINE 15 MG/ML IJ SOLN
INTRAMUSCULAR | Status: AC
  Filled 2011-09-22: qty 1

## 2011-09-22 MED ORDER — GLYCOPYRROLATE 0.2 MG/ML IJ SOLN
INTRAMUSCULAR | Status: DC | PRN
  Administered 2011-09-22: .8 mg via INTRAVENOUS

## 2011-09-22 MED ORDER — ONDANSETRON HCL 4 MG PO TABS: 4.0000 mg | ORAL_TABLET | Freq: Four times a day (QID) | ORAL | Status: DC | PRN

## 2011-09-22 MED ORDER — NEOSTIGMINE METHYLSULFATE 1 MG/ML IJ SOLN
INTRAMUSCULAR | Status: DC | PRN
  Administered 2011-09-22: 5 mg via INTRAVENOUS

## 2011-09-22 MED ORDER — KCL IN DEXTROSE-NACL 20-5-0.45 MEQ/L-%-% IV SOLN
INTRAVENOUS | Status: DC
  Administered 2011-09-22: 16:00:00 via INTRAVENOUS
  Filled 2011-09-22 (×2): qty 1000

## 2011-09-22 MED ORDER — BUPIVACAINE-EPINEPHRINE PF 0.25-1:200000 % IJ SOLN
INTRAMUSCULAR | Status: DC | PRN
  Administered 2011-09-22 (×2): 20 mL

## 2011-09-22 MED ORDER — KCL IN DEXTROSE-NACL 20-5-0.45 MEQ/L-%-% IV SOLN
INTRAVENOUS | Status: AC
  Filled 2011-09-22: qty 1000

## 2011-09-22 MED ORDER — ONDANSETRON HCL 4 MG/2ML IJ SOLN: 4.0000 mg | Freq: Four times a day (QID) | INTRAMUSCULAR | Status: DC | PRN

## 2011-09-22 MED ORDER — DIPHENHYDRAMINE HCL 50 MG/ML IJ SOLN
25.0000 mg | INTRAMUSCULAR | Status: DC | PRN
  Administered 2011-09-22: 25 mg via INTRAVENOUS

## 2011-09-22 MED ORDER — VANCOMYCIN HCL IN DEXTROSE 1-5 GM/200ML-% IV SOLN
INTRAVENOUS | Status: AC
  Filled 2011-09-22: qty 200

## 2011-09-22 MED ORDER — HYDROMORPHONE HCL PF 1 MG/ML IJ SOLN
INTRAMUSCULAR | Status: AC
  Filled 2011-09-22: qty 1

## 2011-09-22 MED ORDER — OXYMETAZOLINE HCL 0.05 % NA SOLN
2.0000 | Freq: Two times a day (BID) | NASAL | Status: DC
  Filled 2011-09-22: qty 15

## 2011-09-22 SURGICAL SUPPLY — 60 items
BANDAGE GAUZE ELAST BULKY 4 IN (GAUZE/BANDAGES/DRESSINGS) ×4 IMPLANT
BENZOIN TINCTURE PRP APPL 2/3 (GAUZE/BANDAGES/DRESSINGS) ×4 IMPLANT
BLADE HEX COATED 2.75 (ELECTRODE) ×8 IMPLANT
BLADE SURG 15 STRL LF DISP TIS (BLADE) ×3 IMPLANT
BLADE SURG 15 STRL SS (BLADE) ×1
CHLORAPREP W/TINT 26ML (MISCELLANEOUS) ×4 IMPLANT
CLOTH BEACON ORANGE TIMEOUT ST (SAFETY) ×8 IMPLANT
CLSR STERI-STRIP ANTIMIC 1/2X4 (GAUZE/BANDAGES/DRESSINGS) ×4 IMPLANT
COVER SURGICAL LIGHT HANDLE (MISCELLANEOUS) ×8 IMPLANT
DECANTER SPIKE VIAL GLASS SM (MISCELLANEOUS) ×4 IMPLANT
DERMABOND ADVANCED (GAUZE/BANDAGES/DRESSINGS)
DERMABOND ADVANCED .7 DNX12 (GAUZE/BANDAGES/DRESSINGS) IMPLANT
DISSECTOR ROUND CHERRY 3/8 STR (MISCELLANEOUS) ×4 IMPLANT
DRAIN PENROSE 18X1/2 LTX STRL (DRAIN) IMPLANT
DRAPE LAPAROTOMY TRNSV 102X78 (DRAPE) ×4 IMPLANT
DRAPE UTILITY XL STRL (DRAPES) ×4 IMPLANT
DRSG TEGADERM 4X4.75 (GAUZE/BANDAGES/DRESSINGS) ×4 IMPLANT
ELECT REM PT RETURN 9FT ADLT (ELECTROSURGICAL) ×8
ELECTRODE REM PT RTRN 9FT ADLT (ELECTROSURGICAL) ×6 IMPLANT
GAUZE SPONGE 4X4 16PLY XRAY LF (GAUZE/BANDAGES/DRESSINGS) IMPLANT
GLOVE BIO SURGEON STRL SZ7 (GLOVE) ×4 IMPLANT
GLOVE BIOGEL M 8.0 STRL (GLOVE) ×4 IMPLANT
GLOVE BIOGEL PI IND STRL 7.5 (GLOVE) ×3 IMPLANT
GLOVE BIOGEL PI INDICATOR 7.5 (GLOVE) ×1
GOWN PREVENTION PLUS XLARGE (GOWN DISPOSABLE) ×4 IMPLANT
GOWN STRL NON-REIN LRG LVL3 (GOWN DISPOSABLE) ×12 IMPLANT
GOWN STRL REIN XL XLG (GOWN DISPOSABLE) ×8 IMPLANT
KIT BASIN OR (CUSTOM PROCEDURE TRAY) ×8 IMPLANT
MESH ULTRAPRO 6X6 15CM15CM (Mesh General) ×4 IMPLANT
NEEDLE HYPO 22GX1.5 SAFETY (NEEDLE) IMPLANT
NEEDLE HYPO 25X1 1.5 SAFETY (NEEDLE) ×4 IMPLANT
NS IRRIG 1000ML POUR BTL (IV SOLUTION) ×8 IMPLANT
PACK BASIC VI WITH GOWN DISP (CUSTOM PROCEDURE TRAY) ×4 IMPLANT
PACK GENERAL/GYN (CUSTOM PROCEDURE TRAY) ×4 IMPLANT
PENCIL BUTTON HOLSTER BLD 10FT (ELECTRODE) ×4 IMPLANT
SPONGE GAUZE 4X4 12PLY (GAUZE/BANDAGES/DRESSINGS) ×4 IMPLANT
SPONGE LAP 18X18 X RAY DECT (DISPOSABLE) ×4 IMPLANT
STRIP CLOSURE SKIN 1/2X4 (GAUZE/BANDAGES/DRESSINGS) IMPLANT
SUPPORT SCROTAL LG STRP (MISCELLANEOUS) ×4 IMPLANT
SUT CHROMIC 3 0 SH 27 (SUTURE) ×8 IMPLANT
SUT MNCRL AB 4-0 PS2 18 (SUTURE) ×8 IMPLANT
SUT PROLENE 2 0 SH DA (SUTURE) ×8 IMPLANT
SUT SILK 2 0 SH (SUTURE) IMPLANT
SUT SILK 3 0 (SUTURE)
SUT SILK 3-0 18XBRD TIE 12 (SUTURE) IMPLANT
SUT VIC AB 0 CT1 27 (SUTURE) ×1
SUT VIC AB 0 CT1 27XBRD ANTBC (SUTURE) ×3 IMPLANT
SUT VIC AB 2-0 CT2 27 (SUTURE) IMPLANT
SUT VIC AB 2-0 SH 27 (SUTURE) ×2
SUT VIC AB 2-0 SH 27X BRD (SUTURE) ×6 IMPLANT
SUT VIC AB 2-0 UR5 27 (SUTURE) IMPLANT
SUT VIC AB 3-0 SH 27 (SUTURE)
SUT VIC AB 3-0 SH 27X BRD (SUTURE) IMPLANT
SUT VIC AB 3-0 SH 27XBRD (SUTURE) IMPLANT
SUT VICRYL 0 27 CT2 27 ABS (SUTURE) IMPLANT
SUT VICRYL 0 TIES 12 18 (SUTURE) IMPLANT
SUT VICRYL 0 UR6 27IN ABS (SUTURE) IMPLANT
SYR CONTROL 10ML LL (SYRINGE) ×4 IMPLANT
TOWEL OR 17X26 10 PK STRL BLUE (TOWEL DISPOSABLE) ×8 IMPLANT
WATER STERILE IRR 1500ML POUR (IV SOLUTION) ×4 IMPLANT

## 2011-09-22 NOTE — Op Note (Signed)
Hernia, Open, Procedure Note  Indications: The patient presented with a history of a bilateral, reducible inguinal hernia.    Pre-operative Diagnosis: bilateral reducible inguinal hernia Post-operative Diagnosis: same  Surgeon: Wynona Luna.   Assistants: none  Anesthesia: General endotracheal anesthesia  ASA Class: 3  Procedure Details  The patient was seen again in the Holding Room. The risks, benefits, complications, treatment options, and expected outcomes were discussed with the patient. The possibilities of reaction to medication, pulmonary aspiration, perforation of viscus, bleeding, recurrent infection, the need for additional procedures, and development of a complication requiring transfusion or further operation were discussed with the patient and/or family. The likelihood of success in repairing the hernia and returning the patient to their previous functional status is good.  There was concurrence with the proposed plan, and informed consent was obtained. The site of surgery was properly noted/marked. The patient was taken to the Operating Room, identified as Donald Gray, and the procedure verified as bilateral inguinal hernia repair. A Time Out was held and the above information confirmed.  The patient is also scheduled to undergo a left orchiectomy by Dr Marcine Matar of Urology. This will be dictated separately. The patient was placed in the supine position and underwent induction of anesthesia. The lower abdomen and groin was prepped with Chloraprep and draped in the standard fashion.  Dr. Retta Diones began the procedure on the left side.  He made the incision and performed the orchiectomy below the external inguinal ring.  After he completed the procedure, I began my portion of the case.  I opened the external oblique fascia along the direction of its fibers to the external ring.  The patient has a large direct hernia.  We reduced the stump of the spermatic cord  through the internal ring.  The direct hernia was reduced and we closed the floor of the inguinal canal with 0 Vicryl.  Ultrapro mesh was cut into an oval shape and secured with 2-0 Prolene beginning at the pubic tubercle, running it along the shelving edge inferiorly and the external oblique fashion superiorly.  The fascia was closed with 2-0 Vicryl.  The subcutaneous tissues were closed with 3-0 Vicryl and the skin was closed with 4-0 Monocryl.    We then turned our attention to the right side.  0.5% Marcaine with epinephrine was used to anesthetize the skin over the mid-portion of the right inguinal canal. An oblique incision was made. Dissection was carried down through the subcutaneous tissue with cautery to the external oblique fascia.  We opened the external oblique fascia along the direction of its fibers to the external ring.  The spermatic cord was circumferentially dissected bluntly and retracted with a Penrose drain.  The floor of the inguinal canal was inspected and showed a direct hernia.  We skeletonized the spermatic cord and no indirect hernia was noted.  We used the remainder of the piece of Ultrapro mesh, which was cut into a keyhole shape.  This was secured with 2-0 Prolene, beginning at the pubic tubercle, running this along the internal oblique fascia superiorly and the shelving edge inferiorly.  The tails of the mesh were sutured together behind the spermatic cord.  The mesh was tucked underneath the external oblique fascia laterally.  The external oblique fascia was reapproximated with 2-0 Vicryl.  3-0 Vicryl was used to close the subcutaneous tissues and 4-0 Monocryl was used to close the skin in subcuticular fashion.  Benzoin and steri-strips were used to seal the incision.  A clean dressing was applied.  The patient was then extubated and brought to the recovery room in stable condition.  All sponge, instrument, and needle counts were correct prior to closure and at the conclusion of the  case.   Estimated Blood Loss: Minimal                 Complications: None; patient tolerated the procedure well.         Disposition: PACU - hemodynamically stable.         Condition: stable  Wilmon Arms. Corliss Skains, MD, [facility]  09/22/2011 12:57 PM

## 2011-09-22 NOTE — Transfer of Care (Signed)
Immediate Anesthesia Transfer of Care Note  Patient: Donald Gray  Procedure(s) Performed: Procedure(s) (LRB): HERNIA REPAIR INGUINAL ADULT BILATERAL (Bilateral) INSERTION OF MESH (Bilateral) ORCHIECTOMY (Left)  Patient Location: PACU  Anesthesia Type: General  Level of Consciousness: awake, sedated and patient cooperative  Airway & Oxygen Therapy: Patient Spontanous Breathing and Patient connected to face mask oxygen  Post-op Assessment: Report given to PACU RN and Post -op Vital signs reviewed and stable  Post vital signs: Reviewed and stable  Complications: No apparent anesthesia complications

## 2011-09-22 NOTE — H&P (Signed)
Urology History and Physical Exam  CC: left scrotal swelling, bilateral inguinal hernias  HPI: 71 year old male with the following history:   This man underwent right radical nephrectomy 4/14/2011for a large renal cell carcinoma. Pathologic stage TII A. tumor, clear cell type with rhabdoid features, Fuhrman nuclear grade 4/4. His surgery started laparoscopically and was converted to open due to multiple renal veins. He had a posterior vena cava injury which was closed by vascular surgery.  Unfortunately, he did have evidence of pulmonary recurrence of his renal cell carcinoma. He underwent IL-2 therapy in [place] at the [facility]. It was found to be relatively ineffective, and he was then put on targeted therapy, and entered in a study. He has had no radiographic evidence of cancer recurrence.  He was seen in early March by our mid-level for left testicular nodularity. This is fairly new for him. This is worrisome, especially with his history of metastatic carcinoma. He has had no significant pain, but the nodularity remains. There has been slight growth of this. It is uncomfortable for the patient, and is associated with a hydrocele. He also has bilateral inguinal hernias that are symptomatic.  The patient has been followed up by me. I have recommended inguinal exploration, with possible testicular biopsy/orchiectomy. If there is a benign process, the testicle will be left in and the hydrocele will be repaired. He is aware of risks and complications of the procedure and desires to proceed. He has seen Dr. Ashley Royalty Tseui from general surgery, who will perform a bilateral inguinal hernia repair at the same time.      PMH: Past Medical History  Diagnosis Date  . Hypertension   . Cancer     pt with left kidney ca with resultant nephrectomy and mets to the lungs.  . Coronary artery disease   . Hyperlipidemia   . Heart attack     1995   . Dysrhythmia     atrial fib  hx of   . Shortness of breath     related to sutent and anti PD-1( anticancer drugs )   . Anemia     at time of cancer   . Blood transfusion     last one 2011   . GERD (gastroesophageal reflux disease)   . Arthritis     knees   . Diverticulosis   . H/O interleukin-2 treatment     2012 - for tx of metastatic renal cell carcinoma   . Diabetes mellitus     hx of pt not on meds at this time   . Hearing loss     hearing aids   . Renal disorder     hx of kidney cancer , CKD III    PSH: Past Surgical History  Procedure Date  . Nephrectomy   . Cardiac surgery   . Knee surgery   . Pilonidal cyst drainage   . Shoulder surgery 1997    right shoulder  . Kidney surgery 12/14/2009    removed right kidney  . Joint replacement     right shoulder replacement   . Tonsillectomy     and adenoidectomy   . Cardiac catheterization     05/2009   . Coronary angioplasty with stent placement     1997 at [facility] to RCA     Allergies: Allergies  Allergen Reactions  . Penicillins   . Other     No steroids while on "IL2" therapy    Medications: Prescriptions prior to admission  Medication Sig Dispense Refill  . atorvastatin (LIPITOR) 10 MG tablet Take 10 mg by mouth daily with breakfast.       . azelastine (ASTELIN) 137 MCG/SPRAY nasal spray Place 1 spray into the nose 2 (two) times daily as needed. For congestion; Use in each nostril as directed      . cetirizine (ZYRTEC) 10 MG tablet Take 10 mg by mouth daily. Pt takes zyrtec for 30 days then allegra for 30 days , pt alternates      . fluticasone (FLONASE) 50 MCG/ACT nasal spray Place 2 sprays into the nose daily. Do not use while on "IL2"      . lisinopril (PRINIVIL,ZESTRIL) 20 MG tablet Take 20 mg by mouth 2 (two) times daily. For raised BP while on "IL2", if b/p low per pt only takes one per day per MD      . metoprolol (TOPROL-XL) 50 MG 24 hr tablet Take 25 mg by mouth 2 (two) times daily. Pt takes only one per day if pulse < 60      .  aspirin 325 MG tablet Take 325 mg by mouth daily with breakfast.       . ciprofloxacin (CIPRO) 500 MG tablet Take 500 mg by mouth 2 (two) times daily. For 10 days      . Coenzyme Q10 (CO Q 10) 100 MG CAPS Take 1 tablet by mouth daily.       Marland Kitchen esomeprazole (NEXIUM) 20 MG capsule Take 20 mg by mouth daily before breakfast.      . fexofenadine (ALLEGRA) 180 MG tablet Take 180 mg by mouth daily with breakfast. Pt takes allegra for 30 days then zyrtec for 30 days      . glucosamine-chondroitin 500-400 MG tablet Take 2 tablets by mouth daily.       . magnesium gluconate (MAGONATE) 500 MG tablet Take 500 mg by mouth 2 (two) times daily.       . metroNIDAZOLE (FLAGYL) 500 MG tablet Take 500 mg by mouth 3 (three) times daily. For 10 days      . Multiple Vitamins-Minerals (MULTIVITAMINS THER. W/MINERALS) TABS Take 1 tablet by mouth daily.       . Omega-3 Fatty Acids (FISH OIL) 1200 MG CAPS Take 1 capsule by mouth daily.       Marland Kitchen oxymetazoline (NASAL) 0.05 % nasal spray Place 2 sprays into the nose 2 (two) times daily.       . vitamin C (ASCORBIC ACID) 500 MG tablet Take 500 mg by mouth daily.          Social History: History   Social History  . Marital Status: Married    Spouse Name: N/A    Number of Children: N/A  . Years of Education: N/A   Occupational History  . Not on file.   Social History Main Topics  . Smoking status: Former Games developer  . Smokeless tobacco: Never Used  . Alcohol Use: Yes     occasional glass of wine   . Drug Use: No  . Sexually Active: No   Other Topics Concern  . Not on file   Social History Narrative  . No narrative on file    Family History: Family History  Problem Relation Age of Onset  . Heart disease Father     Review of Systems: Positive: left testicular swelling, discomfort. Negative:  A further 10 point review of systems was negative except what is listed in the HPI.  Physical Exam: @VITALS2 @ General:  No acute distress.   Awake. Head:  Normocephalic.  Atraumatic. ENT:  EOMI.  Mucous membranes moist Neck:  Supple.  No lymphadenopathy. CV:  S1 present. S2 present. Regular rate. Pulmonary: Equal effort bilaterally.  Clear to auscultation bilaterally. Abdomen: Soft.  Non tender to palpation. Skin:  Normal turgor.  No visible rash. Extremity: No gross deformity of bilateral upper extremities.  No gross deformity of    bilateral lower extremities. Neurologic: Alert. Appropriate mood.  Penis:  circumcised.  No lesions. Urethra:   Orthotopic meatus. Scrotum: Left-sided hydrocele noted. No right hydrocele. There is significant peritesticular nodularity on the left without significant tenderness.right testicle was normal.   Studies:  No results found for this basename: HGB:2,WBC:2,PLT:2 in the last 72 hours  No results found for this basename: NA:2,K:2,CL:2,CO2:2,BUN:2,CREATININE:2,CALCIUM:2,MAGNESIUM:2,GFRNONAA:2,GFRAA:2 in the last 72 hours   No results found for this basename: PT:2,INR:2,APTT:2 in the last 72 hours   No components found with this basename: ABG:2    Assessment:  1. Left testicular/paratesticular mass. In this man with a history of metastatic renal cell carcinoma, I am worried of recurrence within the scrotum. This may be a benign process, however  2. Bilateral inguinal hernias  3. History of renal cell carcinoma,status post right radical nephrectomy 08/2009. Pathologic stage TII A. With rhabdoid features, Fuhrman nuclear grade 4/. There has been recurrence within his lungs, he has completed IL-2 therapy and is on target therapy at the present time with excellent success.  Plan: 1. Dr. Corliss Skains we will perform bilateral inguinal hernia repair  2. I will proceed with left inguinal exploration, possible hydrocelectomy, possible testicular biopsy, possible left radical orchiectomy.

## 2011-09-22 NOTE — Anesthesia Postprocedure Evaluation (Signed)
  Anesthesia Post-op Note  Patient: Donald Gray  Procedure(s) Performed: Procedure(s) (LRB): HERNIA REPAIR INGUINAL ADULT BILATERAL (Bilateral) INSERTION OF MESH (Bilateral) ORCHIECTOMY (Left)  Patient Location: PACU  Anesthesia Type: General  Level of Consciousness: awake and alert   Airway and Oxygen Therapy: Patient Spontanous Breathing  Post-op Pain: mild  Post-op Assessment: Post-op Vital signs reviewed, Patient's Cardiovascular Status Stable, Respiratory Function Stable, Patent Airway and No signs of Nausea or vomiting  Post-op Vital Signs: stable  Complications: No apparent anesthesia complications

## 2011-09-22 NOTE — Op Note (Signed)
Preoperative diagnosis: History of renal cell carcinoma with left paratesticular mass  Postoperative diagnosis: Same  Principal procedure: Left inguinal exploration, left radical orchiectomy  Surgeon: Hera Celaya  Assistant: Tsuei  Anesthesia: Gen. endotracheal  Specimens: Left testicle and cord  Complications: None  Indications: 71 year old male status post right radical orchiectomy 2 years ago for stage IIA renal cell carcinoma, rhabdoid features, Fuhrman nuclear grade 4/4. He is status post IL-2 therapy, and is currently receiving targeted therapy for pulmonary metastatic disease. He has had excellent response to this. Over the past couple of months he has had increasing size of the left paratesticular mass. This is minimally uncomfortable. There is a worry for neoplasm. Additionally, the patient has bilateral inguinal hernias. He is scheduled for hernia repair by Dr. Corliss Skains. The patient is aware of risks and complications of a possible orchiectomy. He understands these and desires to proceed.  Description of procedure: The patient was identified in the holding area and received preoperative IV antibiotics. The proper surgical site was marked. He was taken the operating room where general anesthetic was administered. He was placed in the recumbent position. Abdomen as well as penis and scrotum were prepped and draped. Timeout was then performed.  A 5 cm incision was made in the left inguinal region and carried down through the fatty layer with electrocautery. The cord was identified exiting the left external inguinal ring. The cord was then skeletonized, and a Penrose drain placed around it for tourniquet purposes. Dissection was carried down along the cord distally. The ilioinguinal nerve was not encountered. The tunica vaginalis was then delivered from the hemiscrotum on the left, and the gubernaculum dissected and cut. It was evident that there was a small hydrocele. This was opened. There was  a normal testicle and epididymis. Superior to this along the cord was a significant, approximate 5 cm rounded mass which was not associated with a hydrocele. At this point, because of the size of the mass in the possibility that this was related to his history of renal neoplasm, I decided not to perform an incisional biopsy, as this has been growing and uncomfortable. It was decided at this point to perform a radical orchiectomy. The hernia sac was dissected off the cord. I then placed approximately 8 cc of quarter percent plain Marcaine in the cord block on the left. The cord was then clamped proximally and 2 separate packages with Tresa Endo clamps. The cord was divided distal to these, and the cord stumps were then ligated with 2 separate #1 Vicryl ties on each tissue package. It was evident that hemostasis was present. The scrotum was everted, and no bleeding was seen. The specimen was sent as "left testicle and cord". The patient tolerated this part of the procedure well. Dr. Corliss Skains then commenced with bilateral hernia repair.

## 2011-09-22 NOTE — Interval H&P Note (Signed)
History and Physical Interval Note:  09/22/2011 9:35 AM  Donald Gray  has presented today for surgery, with the diagnosis of bilateral inguinal hernia  The various methods of treatment have been discussed with the patient and family. After consideration of risks, benefits and other options for treatment, the patient has consented to  Procedure(s) (LRB): HERNIA REPAIR INGUINAL ADULT BILATERAL (Bilateral) INSERTION OF MESH (Bilateral) ORCHIECTOMY (Left) as a surgical intervention .  The patients' history has been reviewed, patient examined, no change in status, stable for surgery.  I have reviewed the patients' chart and labs.  Questions were answered to the patient's satisfaction.     [name]

## 2011-09-22 NOTE — H&P (Signed)
HPI Donald Gray is a 71 y.o. male.  Referred by Dr. Marcine Matar for evaluation of bilateral inguinal hernias. HPI Donald Gray is a 71 year old male who is status post right radical nephrectomy in 2011 for a large renal cell carcinoma. He had a pulmonary recurrence of his renal cell carcinoma and has undergone extensive treatment in [place]. Currently he has no evidence of disease. He has had a recent CT scan which showed no sign of recurrent disease. He recently was evaluated by Dr. Retta Diones for left testicular nodularity. He also has a left hydrocele. On examination as well as the recent CT scan he has bilateral inguinal hernias containing only fat. No sign of involvement of the intestine. He is now referred for surgical evaluation. Dr. Retta Diones is hoping to coordinate the procedure so that he can perform a left inguinal exploration and biopsy/possible left orchiectomy and left hydrocelectomy. Past Medical History   Diagnosis  Date   .  Hypertension     .  Renal disorder     .  Cancer         pt with left kidney ca with resultant nephrectomy and mets to the lungs.   .  Coronary artery disease     .  Arthritis     .  Blood transfusion     .  Hyperlipidemia     .  Heart attack           Past Surgical History   Procedure  Date   .  Nephrectomy     .  Cardiac surgery     .  Knee surgery     .  Pilonidal cyst drainage     .  Shoulder surgery  1997       right shoulder   .  Kidney surgery  12/14/2009       removed right kidney         Family History   Problem  Relation  Age of Onset   .  Heart disease  Father          Social History History   Substance Use Topics   .  Smoking status:  Former Games developer   .  Smokeless tobacco:  Former Neurosurgeon       Quit date:  08/16/1956   .  Alcohol Use:  Yes         Allergies   Allergen  Reactions   .  Penicillins     .  Other         No steroids while on "IL2" therapy         Current Outpatient  Prescriptions   Medication  Sig  Dispense  Refill   .  aspirin 325 MG tablet  Take 325 mg by mouth daily.           Marland Kitchen  atorvastatin (LIPITOR) 10 MG tablet  Take 10 mg by mouth daily.           Marland Kitchen  azelastine (ASTELIN) 137 MCG/SPRAY nasal spray  Place 1 spray into the nose 2 (two) times daily as needed. For congestion; Use in each nostril as directed          .  Coenzyme Q10 (CO Q 10) 100 MG CAPS  Take 1 tablet by mouth daily.           .  fexofenadine (ALLEGRA) 180 MG tablet  Take 180 mg by mouth daily.           Marland Kitchen  fluticasone (FLONASE) 50 MCG/ACT nasal spray  Place 2 sprays into the nose daily. Do not use while on "IL2"          .  glucosamine-chondroitin 500-400 MG tablet  Take 2 tablets by mouth daily.           Marland Kitchen  lisinopril (PRINIVIL,ZESTRIL) 20 MG tablet  Take 20 mg by mouth daily as needed. For raised BP while on "IL2"          .  magnesium gluconate (MAGONATE) 500 MG tablet  Take 500 mg by mouth 2 (two) times daily.           .  metoprolol (TOPROL-XL) 50 MG 24 hr tablet  Take 50 mg by mouth daily.          .  Multiple Vitamins-Minerals (MULTIVITAMINS THER. W/MINERALS) TABS  Take 1 tablet by mouth daily.           .  Omega-3 Fatty Acids (FISH OIL) 1200 MG CAPS  Take 1 capsule by mouth daily.           Marland Kitchen  oxymetazoline (NASAL) 0.05 % nasal spray  Place 2 sprays into the nose 2 (two) times daily.           .  vitamin C (ASCORBIC ACID) 500 MG tablet  Take 500 mg by mouth daily.                Review of Systems Review of Systems  Constitutional: Negative for fever, chills and unexpected weight change.  HENT: Negative for hearing loss, congestion, sore throat, trouble swallowing and voice change.   Eyes: Negative for visual disturbance.  Respiratory: Negative for cough and wheezing.   Cardiovascular: Negative for chest pain, palpitations and leg swelling.  Gastrointestinal: Negative for nausea, vomiting, abdominal pain, diarrhea, constipation, blood in stool, abdominal distention, anal  bleeding and rectal pain.  Genitourinary: Positive for scrotal swelling and testicular pain. Negative for hematuria and difficulty urinating.  Musculoskeletal: Negative for arthralgias.  Skin: Negative for rash and wound.  Neurological: Negative for seizures, syncope, weakness and headaches.  Hematological: Negative for adenopathy. Does not bruise/bleed easily.  Psychiatric/Behavioral: Negative for confusion.      Blood pressure 140/80, pulse 80, temperature 98.2 F (36.8 C), temperature source Temporal, height 5\' 11"  (1.803 m), weight 218 lb 12.8 oz (99.247 kg), SpO2 98.00%.   Physical Exam Physical Exam WDWN in NAD HEENT:  EOMI, sclera anicteric Neck:  No masses, no thyromegaly Lungs:  CTA bilaterally; normal respiratory effort CV:  Regular rate and rhythm; no murmurs Abd:  +bowel sounds, soft, non-tender, no masses GU:  Bilateral descended testes; nodularity of the left paratesticular structures Bilateral inguinal hernias - left greater than right; reducible; relatively small Ext:  Well-perfused; no edema Skin:  Warm, dry; no sign of jaundice   Data Reviewed CT scan [facility]   Assessment    Bilateral inguinal hernias - reducible     Plan    Bilateral inguinal hernia repairs with mesh - will coordinate with Dr. Retta Diones for his portion of the procedure.  We will first obtain cardiac clearance from Dr. Katrinka Blazing, and then will coordinate the procedure with Urology.  The surgical procedure has been discussed with the patient.  Potential risks, benefits, alternative treatments, and expected outcomes have been explained.  All of the patient's questions at this time have been answered.  The likelihood of reaching the patient's treatment goal is good.  The patient understand the proposed surgical  procedure and wishes to proceed.       Wilmon Arms. Corliss Skains, MD, [facility]  09/22/2011 9:34 AM

## 2011-09-22 NOTE — Anesthesia Preprocedure Evaluation (Addendum)
Anesthesia Evaluation  Patient identified by MRN, date of birth, ID band Patient awake    Reviewed: Allergy & Precautions, H&P , NPO status , Patient's Chart, lab work & pertinent test results  Airway Mallampati: II TM Distance: >3 FB Neck ROM: Full    Dental No notable dental hx.    Pulmonary neg pulmonary ROS,  breath sounds clear to auscultation  Pulmonary exam normal       Cardiovascular hypertension, Pt. on medications + CAD and + Past MI negative cardio ROS  + dysrhythmias Atrial Fibrillation Rhythm:Regular Rate:Normal     Neuro/Psych negative neurological ROS  negative psych ROS   GI/Hepatic negative GI ROS, Neg liver ROS,   Endo/Other  negative endocrine ROSDiabetes mellitus-  Renal/GU negative Renal ROSH/o metastatic renal cell cancer. Mets to lungs  negative genitourinary   Musculoskeletal negative musculoskeletal ROS (+)   Abdominal   Peds negative pediatric ROS (+)  Hematology negative hematology ROS (+)   Anesthesia Other Findings   Reproductive/Obstetrics negative OB ROS                          Anesthesia Physical Anesthesia Plan  ASA: II  Anesthesia Plan: General   Post-op Pain Management:    Induction: Intravenous  Airway Management Planned:   Additional Equipment:   Intra-op Plan:   Post-operative Plan: Extubation in OR  Informed Consent: I have reviewed the patients History and Physical, chart, labs and discussed the procedure including the risks, benefits and alternatives for the proposed anesthesia with the patient or authorized representative who has indicated his/her understanding and acceptance.   Dental advisory given  Plan Discussed with: CRNA  Anesthesia Plan Comments:         Anesthesia Quick Evaluation

## 2011-09-23 MED ORDER — HYDROCODONE-ACETAMINOPHEN 5-325 MG PO TABS
1.0000 | ORAL_TABLET | Freq: Once | ORAL | Status: AC
  Administered 2011-09-23: 1 via ORAL
  Filled 2011-09-23: qty 1

## 2011-09-23 MED ORDER — HYDROCODONE-ACETAMINOPHEN 5-325 MG PO TABS: 1.0000 | ORAL_TABLET | ORAL | Status: AC | PRN

## 2011-09-23 NOTE — Discharge Summary (Signed)
Physician Discharge Summary  Patient ID: Donald Gray MRN: [id number] DOB/AGE: [birth year] 71 y.o.  Admit date: 09/22/2011 Discharge date: 09/23/2011  Admission Diagnoses: Bilateral inguinal hernias; left testicular mass  Discharge Diagnoses: Same Active Problems:  * No active hospital problems. *    Discharged Condition: good  Hospital Course: He underwent left orchiectomy and bilateral inguinal hernia repairs with mesh on 5/22.  Due to his medical comorbidities, he was kept overnight, but remained stable.  He is doing well on the day of discharge.  Consults: urology  Significant Diagnostic Studies: none  Treatments: surgery: see above  Discharge Exam: Blood pressure 117/63, pulse 79, temperature 99.1 F (37.3 C), temperature source Oral, resp. rate 18, height 5\' 11"  (1.803 m), weight 216 lb 8 oz (98.204 kg), SpO2 99.00%. Dressings dry - minimal swelling; some bruising in left hemiscrotum  Disposition: 01-Home or Self Care  Discharge Orders    Future Orders Please Complete By Expires   Diet general      Increase activity slowly      May walk up steps      May shower / Bathe      Driving Restrictions      Comments:   Do not drive while taking pain medications   Call MD for:  temperature >100.4      Call MD for:  persistant nausea and vomiting      Call MD for:  severe uncontrolled pain      Call MD for:  redness, tenderness, or signs of infection (pain, swelling, redness, odor or green/yellow discharge around incision site)        Medication List  As of 09/23/2011  8:29 AM   TAKE these medications         aspirin 325 MG tablet   Take 325 mg by mouth daily with breakfast.      atorvastatin 10 MG tablet   Commonly known as: LIPITOR   Take 10 mg by mouth daily with breakfast.      azelastine 137 MCG/SPRAY nasal spray   Commonly known as: ASTELIN   Place 1 spray into the nose 2 (two) times daily as needed. For congestion; Use in each nostril as directed     cetirizine 10 MG tablet   Commonly known as: ZYRTEC   Take 10 mg by mouth daily. Pt takes zyrtec for 30 days then allegra for 30 days , pt alternates      ciprofloxacin 500 MG tablet   Commonly known as: CIPRO   Take 500 mg by mouth 2 (two) times daily. For 10 days      Co Q 10 100 MG Caps   Take 1 tablet by mouth daily.      esomeprazole 20 MG capsule   Commonly known as: NEXIUM   Take 20 mg by mouth daily before breakfast.      fexofenadine 180 MG tablet   Commonly known as: ALLEGRA   Take 180 mg by mouth daily with breakfast. Pt takes allegra for 30 days then zyrtec for 30 days      Fish Oil 1200 MG Caps   Take 1 capsule by mouth daily.      fluticasone 50 MCG/ACT nasal spray   Commonly known as: FLONASE   Place 2 sprays into the nose daily. Do not use while on "IL2"      glucosamine-chondroitin 500-400 MG tablet   Take 2 tablets by mouth daily.      HYDROcodone-acetaminophen 5-325 MG per tablet  Commonly known as: NORCO   Take 1 tablet by mouth every 4 (four) hours as needed for pain.      lisinopril 20 MG tablet   Commonly known as: PRINIVIL,ZESTRIL   Take 20 mg by mouth 2 (two) times daily. For raised BP while on "IL2", if b/p low per pt only takes one per day per MD      magnesium gluconate 500 MG tablet   Commonly known as: MAGONATE   Take 500 mg by mouth 2 (two) times daily.      metoprolol succinate 50 MG 24 hr tablet   Commonly known as: TOPROL-XL   Take 25 mg by mouth 2 (two) times daily. Pt takes only one per day if pulse < 60      metroNIDAZOLE 500 MG tablet   Commonly known as: FLAGYL   Take 500 mg by mouth 3 (three) times daily. For 10 days      multivitamins ther. w/minerals Tabs   Take 1 tablet by mouth daily.      vitamin C 500 MG tablet   Commonly known as: ASCORBIC ACID   Take 500 mg by mouth daily.           Follow-up Information    Follow up with Jera Headings K., MD. Schedule an appointment as soon as possible for a visit in 3  weeks.   Contact information:   [employer] [address] [place] [address] [phone]          Signed: Wynona Luna. 09/23/2011, 8:29 AM

## 2011-09-23 NOTE — Progress Notes (Signed)
1 Day Post-Op  Subjective: Patient has some soreness - prefers to use Vicodin instead of Percocet (hallucinations) Tolerating regular diet  Objective: Vital signs in last 24 hours: Temp:  [96.8 F (36 C)-99.1 F (37.3 C)] 99.1 F (37.3 C) (05/23 0543) Pulse Rate:  [59-84] 79  (05/23 0543) Resp:  [15-20] 18  (05/23 0543) BP: (117-141)/(60-79) 117/63 mmHg (05/23 0543) SpO2:  [98 %-100 %] 99 % (05/23 0543) Weight:  [216 lb 8 oz (98.204 kg)] 216 lb 8 oz (98.204 kg) (05/22 1417) Last BM Date: 09/22/11  Intake/Output from previous day: 05/22 0701 - 05/23 0700 In: 2850 [P.O.:200; I.V.:2650] Out: 1575 [Urine:1575] Intake/Output this shift: Total I/O In: 779.2 [I.V.:779.2] Out: 250 [Urine:250]  General appearance: alert, cooperative and no distress GI: soft, non-tender; bowel sounds normal; no masses,  no organomegaly Dressings - dry, minimal swelling; scrotum with some bruising  Lab Results:  No results found for this basename: WBC:2,HGB:2,HCT:2,PLT:2 in the last 72 hours BMET No results found for this basename: NA:2,K:2,CL:2,CO2:2,GLUCOSE:2,BUN:2,CREATININE:2,CALCIUM:2 in the last 72 hours PT/INR No results found for this basename: LABPROT:2,INR:2 in the last 72 hours ABG No results found for this basename: PHART:2,PCO2:2,PO2:2,HCO3:2 in the last 72 hours  Studies/Results: No results found.  Anti-infectives: Anti-infectives     Start     Dose/Rate Route Frequency Ordered Stop   09/22/11 1400   ceFAZolin (ANCEF) IVPB 1 g/50 mL premix  Status:  Discontinued        1 g 100 mL/hr over 30 Minutes Intravenous Every 6 hours 09/22/11 1305 09/22/11 1343   09/22/11 0822   vancomycin (VANCOCIN) IVPB 1000 mg/200 mL premix        1,000 mg 200 mL/hr over 60 Minutes Intravenous 120 min pre-op 09/22/11 0822 09/22/11 1015          Assessment/Plan: s/p Procedure(s) (LRB): HERNIA REPAIR INGUINAL ADULT BILATERAL (Bilateral) INSERTION OF MESH (Bilateral) ORCHIECTOMY  (Left) Discharge PO Vicodin   LOS: 1 day    Donald Gray K. 09/23/2011

## 2011-09-23 NOTE — Discharge Instructions (Signed)
[facility], [place]   INGUINAL HERNIA REPAIR: POST OP INSTRUCTIONS  Always review your discharge instruction sheet given to you by the facility where your surgery was performed. IF YOU HAVE DISABILITY OR FAMILY LEAVE FORMS, YOU MUST BRING THEM TO THE OFFICE FOR PROCESSING.   DO NOT GIVE THEM TO YOUR DOCTOR.  1. A  prescription for pain medication may be given to you upon discharge.  Take your pain medication as prescribed, if needed.  If narcotic pain medicine is not needed, then you may take acetaminophen (Tylenol) or ibuprofen (Advil) as needed. 2. Take your usually prescribed medications unless otherwise directed. 3. If you need a refill on your pain medication, please contact your pharmacy.  They will contact our office to request authorization. Prescriptions will not be filled after 5 pm or on week-ends. 4. You should follow a light diet the first 24 hours after arrival home, such as soup and crackers, etc.  Be sure to include lots of fluids daily.  Resume your normal diet the day after surgery. 5. Most patients will experience some swelling and bruising in the groin and scrotum.  Ice packs and reclining will help.  Swelling and bruising can take several days to resolve.  Wearing an athletic supporter may decrease the swelling. 6. It is common to experience some constipation if taking pain medication after surgery.  Increasing fluid intake and taking a stool softener (such as Colace) will usually help or prevent this problem from occurring.  A mild laxative (Milk of Magnesia or Miralax) should be taken according to package directions if there are no bowel movements after 48 hours. 7. Unless discharge instructions indicate otherwise, you may remove your bandages 24-48 hours after surgery, and you may shower at that time.  You will have steri-strips (small skin tapes) in place directly over the incision.  These strips should be left on the skin for 7-10 days.  8. ACTIVITIES:  You may  resume regular (light) daily activities beginning the next day--such as daily self-care, walking, climbing stairs--gradually increasing activities as tolerated.  You may have sexual intercourse when it is comfortable.  Refrain from any heavy lifting or straining until approved by your doctor. a. You may drive when you are no longer taking prescription pain medication, you can comfortably wear a seatbelt, and you can safely maneuver your car and apply brakes. b. RETURN TO WORK:  2-3 weeks with light duty - no lifting over 15 lbs. 9. You should see your doctor in the office for a follow-up appointment approximately 2-3 weeks after your surgery.  Make sure that you call for this appointment within a day or two after you arrive home to insure a convenient appointment time. 10. OTHER INSTRUCTIONS:  __________________________________________________________________________________________________________________________________________________________________________________________  WHEN TO CALL YOUR DOCTOR: 1. Fever over 101.0 2. Inability to urinate 3. Nausea and/or vomiting 4. Extreme swelling or bruising 5. Continued bleeding from incision. 6. Increased pain, redness, or drainage from the incision  The clinic staff is available to answer your questions during regular business hours.  Please don't hesitate to call and ask to speak to one of the nurses for clinical concerns.  If you have a medical emergency, go to the nearest emergency room or call 911.  A surgeon from [facility] is always on call at the hospital   [address], [place], [place]  [address] ?  [address] [address], [place], [place]   [address] [phone]  FAX [phone] Web site: [email or web address]

## 2011-09-23 NOTE — Progress Notes (Signed)
Called pt at home and notified him and spouse that pt needs to purchase a Jock strap for scrotal support, and to wear except for when he takes shower per oncall MD, Dr. Luisa Hart.

## 2011-09-24 ENCOUNTER — Telehealth (INDEPENDENT_AMBULATORY_CARE_PROVIDER_SITE_OTHER): Payer: Self-pay | Admitting: General Surgery

## 2011-09-24 ENCOUNTER — Encounter (HOSPITAL_COMMUNITY): Payer: Self-pay | Admitting: Surgery

## 2011-09-24 NOTE — Telephone Encounter (Signed)
Called patient to check on him. He spoke with Dr Ezzard Standing last night, he was running a fever of 100.8 and having burning with urination. His fever is down today to 99. The burning is getting better. He will call us if he needs [place] and follow up at scheduled appt.

## 2011-10-11 ENCOUNTER — Encounter (INDEPENDENT_AMBULATORY_CARE_PROVIDER_SITE_OTHER): Payer: Self-pay | Admitting: Surgery

## 2011-10-11 ENCOUNTER — Ambulatory Visit (INDEPENDENT_AMBULATORY_CARE_PROVIDER_SITE_OTHER): Payer: Medicare Other | Admitting: Surgery

## 2011-10-11 VITALS — BP 130/72 | HR 81 | Temp 96.2°F | Ht 71.0 in | Wt 216.4 lb

## 2011-10-11 DIAGNOSIS — K4020 Bilateral inguinal hernia, without obstruction or gangrene, not specified as recurrent: Secondary | ICD-10-CM

## 2011-10-11 NOTE — Progress Notes (Signed)
Status post open bilateral inguinal hernia repairs with mesh on 09/22/11.  He also underwent a left orchiectomy by Dr. Retta Diones which was fortunately benign. The patient is doing quite well. He has some skin sensitivity between these 2 incisions. Otherwise he has minimal pain. Both incisions are healing well with no sign of infection. No seroma. He does have some firm scar tissue underneath each incision. This showed soften up over time. No sign of recurrent hernia.  Impression: Bilateral inguinal hernias (direct) status post open bilateral repairs with mesh.  Plan: The patient should limit his level of activity for another 3 weeks and then may resume full activity. Followup p.r.n.  Wilmon Arms. Corliss Skains, MD, [facility]  10/11/2011 11:48 AM

## 2011-10-11 NOTE — Patient Instructions (Signed)
Resume full activity in 3 weeks.

## 2011-10-21 ENCOUNTER — Encounter (INDEPENDENT_AMBULATORY_CARE_PROVIDER_SITE_OTHER): Payer: Medicare Other | Admitting: Surgery

## 2011-12-09 ENCOUNTER — Ambulatory Visit (INDEPENDENT_AMBULATORY_CARE_PROVIDER_SITE_OTHER): Payer: Medicare Other | Admitting: Internal Medicine

## 2011-12-09 ENCOUNTER — Encounter: Payer: Self-pay | Admitting: Internal Medicine

## 2011-12-09 VITALS — BP 90/60 | HR 69 | Temp 98.6°F | Ht 71.0 in | Wt 218.4 lb

## 2011-12-09 DIAGNOSIS — R0600 Dyspnea, unspecified: Secondary | ICD-10-CM

## 2011-12-09 DIAGNOSIS — R0609 Other forms of dyspnea: Secondary | ICD-10-CM

## 2011-12-09 DIAGNOSIS — R0989 Other specified symptoms and signs involving the circulatory and respiratory systems: Secondary | ICD-10-CM

## 2011-12-09 NOTE — Patient Instructions (Addendum)
Please have full PFT breathing test  After test done, please give office [phone] a call to review results Based on results, you might need CPST pulmonary stress test

## 2011-12-09 NOTE — Progress Notes (Signed)
Subjective:    Patient ID: Donald Gray, male    DOB: [birth year], 71 y.o.   MRN: [id number]  HPI  PCP is Lillia Mountain, MD Body mass index is 30.46 kg/(m^2).  reports that he quit smoking about 50 years ago. His smoking use included Cigarettes. He has a 2 pack-year smoking history. He has never used smokeless tobacco.  Oncologist - Sherrill and Dr Nelson Chimes at [facility] ([email or web address]) Surgeon - [place] Urologist - [place] Cardiologist - Dr Garnette Scheuermann  IOV 12/09/2011 71 year old male. Has family hx of asthma. Diagnosed with renal cancer right kidney - s/p nephrectomy 9cm mass March 2011. 6 months later noted to have pulmonary mets that grew in Jan 2012. Then in [facility] Jan - March 2012 underwent IL-2 Rx. Then tried Sutent and anti PD1 March - Dec 2012. This helped resolve pulmonary mets. Course Complicated by renal failure of left kidney; now largely resolved. Currently 10/26/11 - no evidence of recurrence on CT.   His main pulmonary complaint is dyspnea. Concern is that anti PD-1 is causing dyspnea. Dyspneic since dec 2012 and since then worse gradually. Dyspneic with 1/4 mile exertion walking dog, or walking 3 flights without stopping or outdoor activities. Relived by resting for 1-2 minutes. Unable to condition himself due to dyspnea. Denies associated chest pain, but does have mild associated ace inhibitor cough (being maintained on it for renal protoection despite cought). Denies, orthopnea, paroxysmal nocturnal dyspnea, sputum, hemoptysis. No wheezing except one time when he climbed 3 flights of stairs. Has hx of cardiac cath [facility] in early 2012 - "2 x 40% blockages" . PFts back then normal.   Has hx of sinus drainage and allergies during pollen season  Currently weight is 216# but was 185# at time of cancer diagnosis but baseline weight is 240#  Note: rash in groin - local derm. Rx prednisone ended few days ago    Walk test 12/09/2011 185 feet x 3 laps  Lab  Hgb 12.5gm% - 11/25/11  CT chest Jan 2012  Comparison is made with CT of the chest performed at at [facility]  urology 02/13/2010. Since that exam, multiple lesions in the right  lung are larger. Small left lung lesions are new. Index lesion in  the right lung apex previously measured 7 mm and now measures 12  mm. Right lateral upper lobe lesion previously measured 5 mm and  now measures 11 mm. Retrocardiac lesion in the right lower lobe  previously measured 10 mm and now measures 11 x 17 mm. Nodules in  the left upper lobe measure 5 mm (image 13), 5 mm (image 26).  Nodule in the left lower lobe measures 4 mm on image 40.  Findings are discussed with Dr. Truett Perna.   Addendum Ends  Clinical Data: Follow-up lung nodules.  CT CHEST WITHOUT CONTRAST  Technique: Multidetector CT imaging of the chest was performed  following the standard protocol without IV contrast.  Comparison: 07/18/2009  Findings: No pathologically enlarged mediastinal or axillary lymph  nodes. Hilar regions are difficult to definitively evaluate  without IV contrast. Extensive coronary artery calcification.  Heart size normal. No pericardial effusion. Bilateral  gynecomastia.  There are new pulmonary nodules bilaterally, measuring up to 1.1 x  1.7 cm in the medial right lower lobe (image 37). A tiny calcified  granuloma in the peripheral left upper lobe is again noted. No  pleural fluid. Airway is unremarkable.  Incidental imaging of the upper abdomen shows a small ventral  hernia containing  fat. Right nephrectomy. No worrisome lytic or  sclerotic lesions.  IMPRESSION:  New pulmonary metastatic disease.  CT chest October 26, 2011 (outside facility, images NA)  - per report - LUL granuloma unchanged from Jan 2012. No other pulmonary nodules. No metastatic disease  CT abd/pelvis October 26, 2011  - s/p nephrectomy. No evidence of cancer recurrence    Past Medical History  Diagnosis Date  . Hypertension   . Cancer       pt with left kidney ca with resultant nephrectomy and mets to the lungs.  . Coronary artery disease   . Hyperlipidemia   . Heart attack     1995   . Dysrhythmia     atrial fib hx of   . Shortness of breath     related to sutent and anti PD-1( anticancer drugs )   . Anemia     at time of cancer   . Blood transfusion     last one 2011   . GERD (gastroesophageal reflux disease)   . Arthritis     knees   . Diverticulosis   . H/O interleukin-2 treatment     2012 - for tx of metastatic renal cell carcinoma   . Diabetes mellitus     hx of pt not on meds at this time   . Hearing loss     hearing aids   . Renal disorder     hx of kidney cancer , CKD III     Family History  Problem Relation Age of Onset  . Heart disease Father      History   Social History  . Marital Status: Married    Spouse Name: N/A    Number of Children: N/A  . Years of Education: N/A   Occupational History  . Not on file.   Social History Main Topics  . Smoking status: Former Smoker -- 1.0 packs/day for 2 years    Types: Cigarettes    Quit date: 05/03/1961  . Smokeless tobacco: Never Used  . Alcohol Use: Yes     occasional glass of wine   . Drug Use: No  . Sexually Active: No   Other Topics Concern  . Not on file   Social History Narrative  . No narrative on file     Allergies  Allergen Reactions  . Penicillins   . Ambien (Zolpidem Tartrate)   . Other     No steroids while on "IL2" therapy     Outpatient Prescriptions Prior to Visit  Medication Sig Dispense Refill  . aspirin 325 MG tablet Take 325 mg by mouth daily with breakfast.       . atorvastatin (LIPITOR) 10 MG tablet Take 10 mg by mouth daily with breakfast.       . azelastine (ASTELIN) 137 MCG/SPRAY nasal spray Place 1 spray into the nose 2 (two) times daily as needed. For congestion; Use in each nostril as directed      . cetirizine (ZYRTEC) 10 MG tablet Take 10 mg by mouth daily. Pt takes zyrtec for 30 days then  allegra for 30 days , pt alternates      . Coenzyme Q10 (CO Q 10) 100 MG CAPS Take 1 tablet by mouth daily.       . fexofenadine (ALLEGRA) 180 MG tablet Take 180 mg by mouth daily with breakfast. Pt takes allegra for 30 days then zyrtec for 30 days      . fluticasone (FLONASE) 50 MCG/ACT nasal spray  Place 2 sprays into the nose daily. Do not use while on "IL2"      . glucosamine-chondroitin 500-400 MG tablet Take 2 tablets by mouth daily.       Marland Kitchen lisinopril (PRINIVIL,ZESTRIL) 20 MG tablet Take 20 mg by mouth 2 (two) times daily. For raised BP while on "IL2", if b/p low per pt only takes one per day per MD      . magnesium gluconate (MAGONATE) 500 MG tablet Take 500 mg by mouth 2 (two) times daily.       . metoprolol (TOPROL-XL) 50 MG 24 hr tablet Take 25 mg by mouth 2 (two) times daily. Pt takes only one per day if pulse < 60      . Multiple Vitamins-Minerals (MULTIVITAMINS THER. W/MINERALS) TABS Take 1 tablet by mouth daily.       . Omega-3 Fatty Acids (FISH OIL) 1200 MG CAPS Take 1 capsule by mouth daily.       . vitamin C (ASCORBIC ACID) 500 MG tablet Take 500 mg by mouth daily.       Marland Kitchen esomeprazole (NEXIUM) 20 MG capsule Take 20 mg by mouth daily before breakfast.          Review of Systems  Constitutional: Negative for fever and unexpected weight change.  HENT: Positive for rhinorrhea. Negative for ear pain, nosebleeds, congestion, sore throat, sneezing, trouble swallowing, dental problem, postnasal drip and sinus pressure.   Eyes: Negative for redness and itching.  Respiratory: Positive for shortness of breath and wheezing. Negative for cough and chest tightness.   Cardiovascular: Negative for palpitations and leg swelling.  Gastrointestinal: Negative for nausea and vomiting.  Genitourinary: Negative for dysuria.  Musculoskeletal: Positive for joint swelling.  Skin: Negative for rash.  Neurological: Negative for headaches.  Hematological: Does not bruise/bleed easily.   Psychiatric/Behavioral: Negative for dysphoric mood. The patient is not nervous/anxious.        Objective:   Physical Exam  Nursing note and vitals reviewed. Constitutional: He is oriented to person, place, and time. He appears well-developed and well-nourished. No distress.       Body mass index is 30.46 kg/(m^2).   HENT:  Head: Normocephalic and atraumatic.  Right Ear: External ear normal.  Left Ear: External ear normal.  Mouth/Throat: Oropharynx is clear and moist. No oropharyngeal exudate.       Mild post nasal drip +  Eyes: Conjunctivae and EOM are normal. Pupils are equal, round, and reactive to light. Right eye exhibits no discharge. Left eye exhibits no discharge. No scleral icterus.  Neck: Normal range of motion. Neck supple. No JVD present. No tracheal deviation present. No thyromegaly present.  Cardiovascular: Normal rate, regular rhythm and intact distal pulses.  Exam reveals no gallop and no friction rub.   No murmur heard. Pulmonary/Chest: Effort normal and breath sounds normal. No respiratory distress. He has no wheezes. He has no rales. He exhibits no tenderness.  Abdominal: Soft. Bowel sounds are normal. He exhibits no distension and no mass. There is no tenderness. There is no rebound and no guarding.  Musculoskeletal: Normal range of motion. He exhibits no edema and no tenderness.  Lymphadenopathy:    He has no cervical adenopathy.  Neurological: He is alert and oriented to person, place, and time. He has normal reflexes. No cranial nerve deficit. Coordination normal.  Skin: Skin is warm and dry. No rash noted. He is not diaphoretic. No erythema. No pallor.  Psychiatric: He has a normal mood and affect. His  behavior is normal. Judgment and thought content normal.          Assessment & Plan:

## 2011-12-19 DIAGNOSIS — R0600 Dyspnea, unspecified: Secondary | ICD-10-CM | POA: Insufficient documentation

## 2011-12-19 NOTE — Assessment & Plan Note (Signed)
Unclear cause of dyspnea. Per him anti PD1 expreimental chemo likely cause of dyspnea but I am not sure what the mechanisim would be because ct does not show fibrosis. HE could still have garden variety causes of dyspnea from deconditioning to asthma to weight issues. I will start with PFTs. If negative, do CPST bike pulmonary stress test. I will call him after PFT. I will at some poiint get in touch with his oncologist Dr Nelson Chimes at [facility] [place]

## 2011-12-23 ENCOUNTER — Ambulatory Visit (INDEPENDENT_AMBULATORY_CARE_PROVIDER_SITE_OTHER): Payer: Medicare Other | Admitting: Internal Medicine

## 2011-12-23 DIAGNOSIS — R0600 Dyspnea, unspecified: Secondary | ICD-10-CM

## 2011-12-23 DIAGNOSIS — R0989 Other specified symptoms and signs involving the circulatory and respiratory systems: Secondary | ICD-10-CM

## 2011-12-23 DIAGNOSIS — R0609 Other forms of dyspnea: Secondary | ICD-10-CM

## 2011-12-23 LAB — PULMONARY FUNCTION TEST

## 2011-12-23 NOTE — Progress Notes (Signed)
PFT done today. 

## 2011-12-26 ENCOUNTER — Telehealth: Payer: Self-pay | Admitting: Internal Medicine

## 2011-12-26 DIAGNOSIS — R0602 Shortness of breath: Secondary | ICD-10-CM

## 2011-12-26 NOTE — Telephone Encounter (Signed)
He was supposed to hae PFTs . What happened ?

## 2011-12-27 NOTE — Telephone Encounter (Signed)
Donald Gray is putting PFT results in your look at stack.

## 2011-12-29 NOTE — Telephone Encounter (Signed)
Can you guys scan it in (see if Dr Maple Hudson or Kriste Basque will sign it) so I can look at results from remote. I am not back in office for a while

## 2011-12-30 ENCOUNTER — Encounter: Payer: Self-pay | Admitting: Internal Medicine

## 2011-12-30 NOTE — Telephone Encounter (Signed)
PFTs normal. Please set him up for CPST bike pulmonary stress stest (I had told him this last ov) with exercise challenge for asthma. Both to be done by MR Chase. I will look at results and call him  Meanwile I have emailed his oncologist about this as well. Please let patient know  Thanks  MR

## 2011-12-30 NOTE — Telephone Encounter (Signed)
I have sent PFT down to be scanned for your review.Carron Curie, CMA

## 2012-01-04 ENCOUNTER — Telehealth: Payer: Self-pay | Admitting: Internal Medicine

## 2012-01-04 NOTE — Telephone Encounter (Signed)
appt for cpst scheduled@[facility]  01/19/12@11 :30am pt aware Tobe Sos

## 2012-01-04 NOTE — Telephone Encounter (Signed)
Pt is aware and order placed. I will forward message to [facility] to make sure they got the order. If [facility] did receive the order then please close the message. Thanks. Carron Curie, CMA

## 2012-01-04 NOTE — Telephone Encounter (Signed)
I do not see where Britt Boozer tried calling pt --  Called, spoke with pt's wife who states she "thinks" pt has already spoken with Victorino Dike.  York Spaniel he was informed of his PFT results and was scheduled for another test on 01/10/12.  Jenn, have you already spoken with pt about this?

## 2012-01-04 NOTE — Telephone Encounter (Signed)
Pt is aware of results. See phone note from 12-26-11.  Carron Curie, CMA

## 2012-01-04 NOTE — Telephone Encounter (Signed)
[email or web address].Jennifer Castillo, CMA  

## 2012-01-10 ENCOUNTER — Ambulatory Visit (HOSPITAL_COMMUNITY): Payer: Medicare Other | Attending: Internal Medicine

## 2012-01-10 DIAGNOSIS — R0602 Shortness of breath: Secondary | ICD-10-CM | POA: Insufficient documentation

## 2012-01-20 ENCOUNTER — Telehealth: Payer: Self-pay | Admitting: Internal Medicine

## 2012-01-20 NOTE — Telephone Encounter (Signed)
Spoke with pt and notified of recs per MR Pt verbalized understanding and denied any questions Ov with MR at 1:30 pm tommorrow

## 2012-01-20 NOTE — Telephone Encounter (Signed)
My apologies for delay. Pleaes apologize on my behalf  You can tell him that Dr Asim his oncologist and I have been in touch  REstult suggest that his metoprolol is likely too strong for him and contributing to shortness of breath but I really need to talk face to face with him. Please give him appt to see me tomorrow.

## 2012-01-20 NOTE — Telephone Encounter (Signed)
I spoke with pt and he is needing his cpst and pft results. He would like to pick these up once MR results on his cpst. This was done last week and results are in EPIC. Pt needs this to take to his doctor next week. Please advise Dr. Marchelle Gearing. thanks

## 2012-01-21 ENCOUNTER — Encounter: Payer: Self-pay | Admitting: Internal Medicine

## 2012-01-21 ENCOUNTER — Ambulatory Visit (INDEPENDENT_AMBULATORY_CARE_PROVIDER_SITE_OTHER): Payer: Medicare Other | Admitting: Internal Medicine

## 2012-01-21 VITALS — BP 132/80 | HR 72 | Temp 97.7°F | Ht 71.0 in | Wt 222.2 lb

## 2012-01-21 DIAGNOSIS — R000 Tachycardia, unspecified: Secondary | ICD-10-CM

## 2012-01-21 DIAGNOSIS — R0989 Other specified symptoms and signs involving the circulatory and respiratory systems: Secondary | ICD-10-CM

## 2012-01-21 DIAGNOSIS — R0609 Other forms of dyspnea: Secondary | ICD-10-CM

## 2012-01-21 DIAGNOSIS — R0600 Dyspnea, unspecified: Secondary | ICD-10-CM

## 2012-01-21 DIAGNOSIS — E669 Obesity, unspecified: Secondary | ICD-10-CM

## 2012-01-21 DIAGNOSIS — R0602 Shortness of breath: Secondary | ICD-10-CM

## 2012-01-21 NOTE — Progress Notes (Signed)
Subjective:    Patient ID: Donald Gray, male    DOB: [birth year], 71 y.o.   MRN: [id number]  HPI PCP is Lillia Mountain, MD Body mass index is 30.46 kg/(m^2).  reports that he quit smoking about 50 years ago. His smoking use included Cigarettes. He has a 2 pack-year smoking history. He has never used smokeless tobacco.  Oncologist - Sherrill and Dr Nelson Chimes at [facility] ([email or web address]) Surgeon - Tsuei Urologist - [place] Cardiologist - Dr Garnette Scheuermann  IOV 12/09/2011 71 year old male. Has family hx of asthma. Diagnosed with renal cancer right kidney - s/p nephrectomy 9cm mass March 2011. 6 months later noted to have pulmonary mets that grew in Jan 2012. Then in [facility] Jan - March 2012 underwent IL-2 Rx. Then tried Sutent and anti PD1 March - Dec 2012. This helped resolve pulmonary mets. Course Complicated by renal failure of left kidney; now largely resolved. Currently 10/26/11 - no evidence of recurrence on CT.   His main pulmonary complaint is dyspnea. Concern is that anti PD-1 is causing dyspnea. Dyspneic since dec 2012 and since then worse gradually. Dyspneic with 1/4 mile exertion walking dog, or walking 3 flights without stopping or outdoor activities. Relived by resting for 1-2 minutes. Unable to condition himself due to dyspnea. Denies associated chest pain, but does have mild associated ace inhibitor cough (being maintained on it for renal protoection despite cought). Denies, orthopnea, paroxysmal nocturnal dyspnea, sputum, hemoptysis. No wheezing except one time when he climbed 3 flights of stairs. Has hx of cardiac cath [facility] in early 2012 - "2 x 40% blockages" . PFts back then normal.   Has hx of sinus drainage and allergies during pollen season  Currently weight is 216# but was 185# at time of cancer diagnosis but baseline weight is 240#  Note: rash in groin - local derm. Rx prednisone ended few days ago    Walk test 12/09/2011 185 feet x 3 laps: no  desats  Lab  Hgb 12.5gm% - 11/25/11  CT chest Jan 2012  Comparison is made with CT of the chest performed at at [facility]  urology 02/13/2010. Since that exam, multiple lesions in the right  lung are larger. Small left lung lesions are new. Index lesion in  the right lung apex previously measured 7 mm and now measures 12  mm. Right lateral upper lobe lesion previously measured 5 mm and  now measures 11 mm. Retrocardiac lesion in the right lower lobe  previously measured 10 mm and now measures 11 x 17 mm. Nodules in  the left upper lobe measure 5 mm (image 13), 5 mm (image 26).  Nodule in the left lower lobe measures 4 mm on image 40.  Findings are discussed with Dr. Truett Perna.   Addendum Ends  Clinical Data: Follow-up lung nodules.  CT CHEST WITHOUT CONTRAST  Technique: Multidetector CT imaging of the chest was performed  following the standard protocol without IV contrast.  Comparison: 07/18/2009  Findings: No pathologically enlarged mediastinal or axillary lymph  nodes. Hilar regions are difficult to definitively evaluate  without IV contrast. Extensive coronary artery calcification.  Heart size normal. No pericardial effusion. Bilateral  gynecomastia.  There are new pulmonary nodules bilaterally, measuring up to 1.1 x  1.7 cm in the medial right lower lobe (image 37). A tiny calcified  granuloma in the peripheral left upper lobe is again noted. No  pleural fluid. Airway is unremarkable.  Incidental imaging of the upper abdomen shows a small ventral  hernia containing fat. Right nephrectomy. No worrisome lytic or  sclerotic lesions.  IMPRESSION:  New pulmonary metastatic disease.  CT chest October 26, 2011 (outside facility, images NA)  - per report - LUL granuloma unchanged from Jan 2012. No other pulmonary nodules. No metastatic disease  CT abd/pelvis October 26, 2011  - s/p nephrectomy. No evidence of cancer recurrence  REC Please have full PFT breathing test  After test  done, please give office [phone] a call to review results  Based on results, you might need CPST pulmonary stress test  OV 01/21/12  FU dyspnea  In interim, no change in dyspnea. No new health issues. I d/with Dr Nelson Chimes his oncologist and per him the PD-1 antibody drug causes pneumonitis which is absent in this patient as of June 2013 CT chest. His PFTs were normal. So, he underwent CPST on bike 01/10/12. Results are reviewed below:  CPX: The RER of 1.16 indicates a maximal effort. The peak VO2 is mildly reduced at 16.8 ml/kg/min (79% of the age/gender/weight matched sedentary norm). When adjusted to the patients ideal body weight of 180 lb (81.8 kg) the peak VO2 is 20.2 ml/kg (ibw)/min (83% of the IBW adjusted predicted).  This tendency to correct to normal suggests obesity might be playing a role in dyspnea. The VO2 a the ventilatory threshold is normal at 53% of the predicted peak VO2. At peak exercise the ventilation was 53% of the measured MVV indicating ventilatory reserve remained. Theree was an inadequate HR response to the exercise with a HR reserve of 28 bpm. The O2pulse (a surrogate for stroke volume) increased throughout the exercise reaching 14 ml/beat (100% of predicted). The results suggests chronotropic incompetence on account of beta blockade and also obesity causing dyspnea   Past, Family, Social reviewed: no change since last visit   Review of Systems  Constitutional: Negative for fever and unexpected weight change.  HENT: Negative for ear pain, nosebleeds, congestion, sore throat, rhinorrhea, sneezing, trouble swallowing, dental problem, postnasal drip and sinus pressure.   Eyes: Negative for redness and itching.  Respiratory: Negative for cough, chest tightness, shortness of breath and wheezing.   Cardiovascular: Negative for palpitations and leg swelling.  Gastrointestinal: Negative for nausea and vomiting.  Genitourinary: Negative for dysuria.  Musculoskeletal: Negative  for joint swelling.  Skin: Negative for rash.  Neurological: Negative for headaches.  Hematological: Does not bruise/bleed easily.  Psychiatric/Behavioral: Negative for dysphoric mood. The patient is not nervous/anxious.        Objective:   Physical Exam Nursing note and vitals reviewed. Constitutional: He is oriented to person, place, and time. He appears well-developed and well-nourished. No distress.       Body mass index is 30.46 kg/(m^2).   HENT:  Head: Normocephalic and atraumatic.  Right Ear: External ear normal.  Left Ear: External ear normal.  Mouth/Throat: Oropharynx is clear and moist. No oropharyngeal exudate.   Eyes: Conjunctivae and EOM are normal. Pupils are equal, round, and reactive to light. Right eye exhibits no discharge. Left eye exhibits no discharge. No scleral icterus.  Neck: Normal range of motion. Neck supple. No JVD present. No tracheal deviation present. No thyromegaly present.  Cardiovascular: Normal rate, regular rhythm and intact distal pulses.  Exam reveals no gallop and no friction rub.   No murmur heard. Pulmonary/Chest: Effort normal and breath sounds normal. No respiratory distress. He has no wheezes. He has no rales. He exhibits no tenderness.  Abdominal: Soft. Bowel sounds are normal. He exhibits no  distension and no mass. There is no tenderness. There is no rebound and no guarding.  Musculoskeletal: Normal range of motion. He exhibits no edema and no tenderness.  Lymphadenopathy:    He has no cervical adenopathy.  Neurological: He is alert and oriented to person, place, and time. He has normal reflexes. No cranial nerve deficit. Coordination normal.  Skin: Skin is warm and dry. No rash noted. He is not diaphoretic. No erythema. No pallor.  Psychiatric: He has a normal mood and affect. His behavior is normal. Judgment and thought content normal.           Assessment & Plan:

## 2012-01-21 NOTE — Patient Instructions (Addendum)
Your shortness of breath is due to weight, fitness and also lopressor being excessive My office will coordinate an appointment with Dr Garnette Scheuermann for you to cut down or eliminate your lopressor Please join your gym and work on fitness Please work on eating low glycemic foods - see the sheet I gave Return in 6 months - aim to lose weigh 1# per month

## 2012-01-23 DIAGNOSIS — E669 Obesity, unspecified: Secondary | ICD-10-CM | POA: Insufficient documentation

## 2012-01-23 NOTE — Assessment & Plan Note (Signed)
His dyspnea is on account of excessive beta blockade from lopressor and secondarily obesity. He will touch base with Dr Garnette Scheuermann of [facility] cardiologist to have his beta blocker down titrated. We discussed dietary management of obesity (Body mass index is 30.99 kg/(m^2).). HE understands this is an issue. I explored his diet and he is largely eating moderate and high glycemic foods. I showed him table of low glycemic foods and he says he will take an effort to eat soley low glycemic foods  > 50% of this 15 min visit spent in face to face counseling

## 2012-02-10 IMAGING — CR DG CHEST 1V PORT
1 series · 1 of 1 positions shown · non-contrast
Comparison: 05/28/2010 and earlier.

CLINICAL DATA: 69-year-old male status post right lung biopsy with
subsequent pneumothorax.

PORTABLE CHEST - 1 VIEW

[AP]
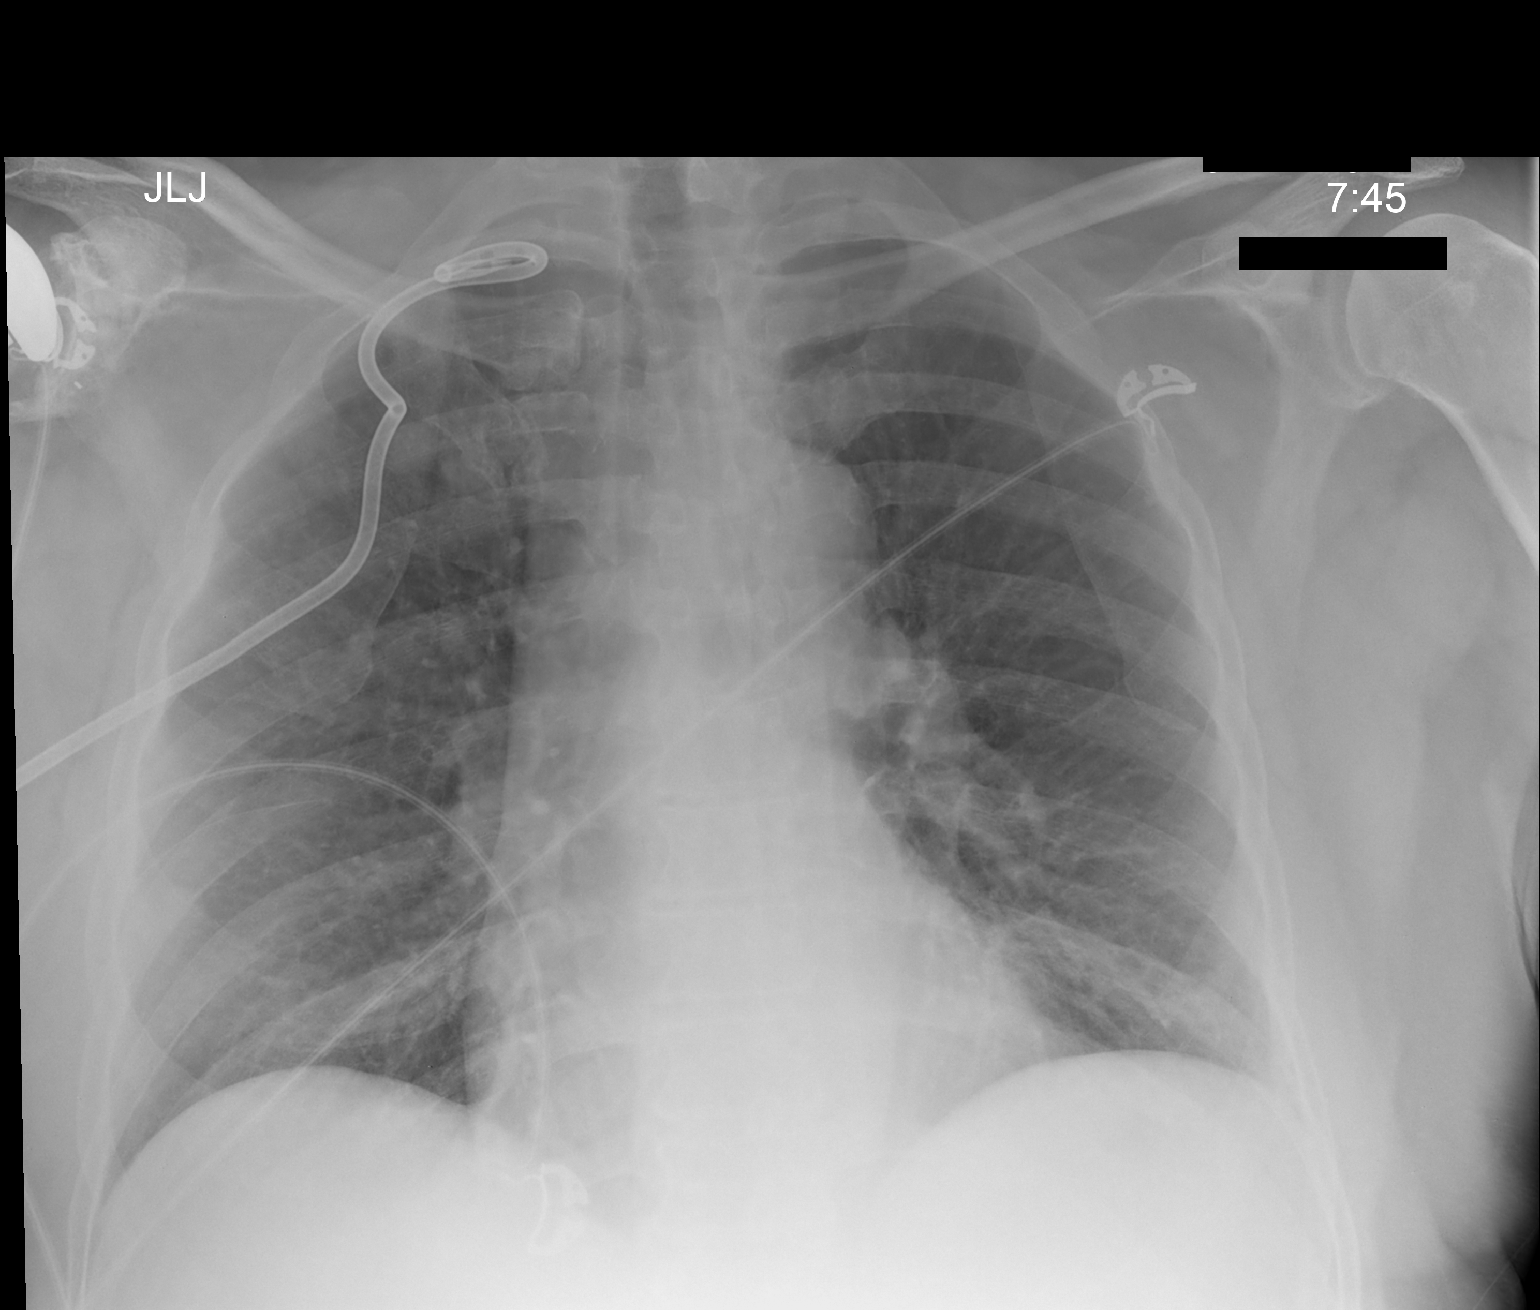

[1 of 1 positions shown; findings below may reference images not displayed]

FINDINGS: Portable semi upright AP view [id number] hours.  Pigtail right
apical pleural tube placed.  No pleural edge identified to suggest
residual pneumothorax, however, there is a symmetric increased
lucency along the medial right lower lung.  Lower lung volumes.
Stable cardiac size and mediastinal contours.  Decreased right
suprahilar opacity.  Left lung is stable.  Postoperative changes at
the right shoulder.
IMPRESSION: 1.  Right apical pigtail chest tube placed.  Significantly reduced
right pneumothorax, asymmetric lucency at the medial right lung
base does raise the possibility of some residual.
2.  Lower lung volumes.
3.  Decreased right suprahilar post-biopsy opacity.

## 2012-10-16 ENCOUNTER — Ambulatory Visit: Payer: Medicare Other | Admitting: Internal Medicine

## 2012-10-25 ENCOUNTER — Other Ambulatory Visit: Payer: Self-pay | Admitting: Gastroenterology

## 2012-10-31 ENCOUNTER — Encounter: Payer: Self-pay | Admitting: Physician Assistant

## 2012-10-31 ENCOUNTER — Other Ambulatory Visit: Payer: Self-pay | Admitting: Physician Assistant

## 2012-10-31 DIAGNOSIS — E119 Type 2 diabetes mellitus without complications: Secondary | ICD-10-CM

## 2012-10-31 DIAGNOSIS — D649 Anemia, unspecified: Secondary | ICD-10-CM

## 2012-10-31 DIAGNOSIS — I10 Essential (primary) hypertension: Secondary | ICD-10-CM

## 2012-10-31 DIAGNOSIS — N289 Disorder of kidney and ureter, unspecified: Secondary | ICD-10-CM

## 2012-10-31 DIAGNOSIS — C801 Malignant (primary) neoplasm, unspecified: Secondary | ICD-10-CM

## 2012-10-31 DIAGNOSIS — K219 Gastro-esophageal reflux disease without esophagitis: Secondary | ICD-10-CM

## 2012-10-31 DIAGNOSIS — K5790 Diverticulosis of intestine, part unspecified, without perforation or abscess without bleeding: Secondary | ICD-10-CM

## 2012-10-31 DIAGNOSIS — I2510 Atherosclerotic heart disease of native coronary artery without angina pectoris: Secondary | ICD-10-CM

## 2012-10-31 DIAGNOSIS — I252 Old myocardial infarction: Secondary | ICD-10-CM | POA: Insufficient documentation

## 2012-10-31 DIAGNOSIS — Z9221 Personal history of antineoplastic chemotherapy: Secondary | ICD-10-CM

## 2012-10-31 DIAGNOSIS — E785 Hyperlipidemia, unspecified: Secondary | ICD-10-CM

## 2012-10-31 DIAGNOSIS — M1990 Unspecified osteoarthritis, unspecified site: Secondary | ICD-10-CM

## 2012-10-31 DIAGNOSIS — M1712 Unilateral primary osteoarthritis, left knee: Secondary | ICD-10-CM

## 2012-10-31 DIAGNOSIS — I25119 Atherosclerotic heart disease of native coronary artery with unspecified angina pectoris: Secondary | ICD-10-CM | POA: Insufficient documentation

## 2012-10-31 DIAGNOSIS — I219 Acute myocardial infarction, unspecified: Secondary | ICD-10-CM

## 2012-10-31 NOTE — H&P (Signed)
TOTAL KNEE ADMISSION H&P  Patient is being admitted for left total knee arthroplasty.  Subjective:  Chief Complaint:left knee pain.  HPI: Donald Gray, 72 y.o. male, has a history of pain and functional disability in the left knee due to arthritis and has failed non-surgical conservative treatments for greater than 12 weeks to includeNSAID's and/or analgesics, corticosteriod injections, viscosupplementation injections, flexibility and strengthening excercises, supervised PT with diminished ADL's post treatment, use of assistive devices, weight reduction as appropriate and activity modification.  Onset of symptoms was gradual, starting 10 years ago with gradually worsening course since that time. The patient noted prior procedures on the knee to include  arthroscopy and menisectomy on the left knee(s).  Patient currently rates pain in the left knee(s) at 10 out of 10 with activity. Patient has night pain, worsening of pain with activity and weight bearing, pain that interferes with activities of daily living, crepitus and joint swelling.  Patient has evidence of subchondral sclerosis, periarticular osteophytes and joint space narrowing by imaging studies.  There is no active infection.  Patient Active Problem List   Diagnosis Date Noted  . Left knee DJD   . Renal disorder   . Diabetes   . H/O interleukin-2 treatment   . Diverticulosis   . Arthritis   . GERD (gastroesophageal reflux disease)   . Anemia   . Heart attack   . Hyperlipidemia   . Coronary artery disease   . Cancer   . Hypertension   . Obesity 01/23/2012  . Dyspnea 12/19/2011  . Bilateral inguinal hernia 08/17/2011   Past Medical History  Diagnosis Date  . Hypertension   . Cancer     pt with left kidney ca with resultant nephrectomy and mets to the lungs.  . Coronary artery disease   . Hyperlipidemia   . Heart attack     1995   . Dysrhythmia     atrial fib hx of   . Shortness of breath     related to sutent  and anti PD-1( anticancer drugs )   . Anemia     at time of cancer   . Blood transfusion     last one 2011   . GERD (gastroesophageal reflux disease)   . Arthritis     knees   . Diverticulosis   . H/O interleukin-2 treatment     2012 - for tx of metastatic renal cell carcinoma   . Diabetes     hx of pt not on meds at this time   . Hearing loss     hearing aids   . Renal disorder     hx of kidney cancer , CKD III  . Left knee DJD     Past Surgical History  Procedure Laterality Date  . Nephrectomy    . Cardiac surgery    . Knee surgery    . Pilonidal cyst drainage    . Shoulder surgery  1997    right shoulder  . Kidney surgery  12/14/2009    removed right kidney  . Joint replacement      right shoulder replacement   . Tonsillectomy      and adenoidectomy   . Cardiac catheterization      05/2009   . Coronary angioplasty with stent placement      1997 at [facility] to RCA   . Inguinal hernia repair  09/22/2011    Procedure: HERNIA REPAIR INGUINAL ADULT BILATERAL;  Surgeon: Matthew K. Tsuei, MD;  Location: [facility]   ORS;  Service: General;  Laterality: Bilateral;  . Orchiectomy  09/22/2011    Procedure: ORCHIECTOMY;  Surgeon: Stephen Dahlstedt, MD;  Location: [facility] ORS;  Service: Urology;  Laterality: Left;  Left Testicular Exploration, Inguinal Approach,  Testicular Orchiectomy,   . Hernia repair  09/22/11    BIH     (Not in a hospital admission) Allergies  Allergen Reactions  . Penicillins   . Ambien (Zolpidem Tartrate)   . Other     No steroids while on "IL2" therapy    Current outpatient prescriptions:aspirin 325 MG tablet, Take 325 mg by mouth daily with breakfast. , Disp: , Rfl: ;  atorvastatin (LIPITOR) 10 MG tablet, Take 10 mg by mouth daily with breakfast. , Disp: , Rfl: ;  azelastine (ASTELIN) 137 MCG/SPRAY nasal spray, Place 1 spray into the nose 2 (two) times daily as needed. For congestion; Use in each nostril as directed, Disp: , Rfl:  cetirizine (ZYRTEC) 10 MG tablet,  Take 10 mg by mouth daily. Pt takes zyrtec for 30 days then allegra for 30 days , pt alternates, Disp: , Rfl: ;  Coenzyme Q10 (CO Q 10) 100 MG CAPS, Take 1 tablet by mouth daily. , Disp: , Rfl: ;  fluticasone (FLONASE) 50 MCG/ACT nasal spray, Place 2 sprays into the nose daily. Do not use while on "IL2", Disp: , Rfl:  lisinopril (PRINIVIL,ZESTRIL) 20 MG tablet, Take 20 mg by mouth 2 (two) times daily. For raised BP while on "IL2", if b/p low per pt only takes one per day per MD, Disp: , Rfl: ;  magnesium gluconate (MAGONATE) 500 MG tablet, Take 500 mg by mouth 2 (two) times daily. , Disp: , Rfl: ;  Omega-3 Fatty Acids (FISH OIL) 1200 MG CAPS, Take 1 capsule by mouth daily. , Disp: , Rfl:  omeprazole (PRILOSEC) 20 MG capsule, Take 20 mg by mouth daily., Disp: , Rfl: ;  pregabalin (LYRICA) 75 MG capsule, Take 75 mg by mouth 2 (two) times daily., Disp: , Rfl: ;  vitamin C (ASCORBIC ACID) 500 MG tablet, Take 500 mg by mouth daily. , Disp: , Rfl: ;  fexofenadine (ALLEGRA) 180 MG tablet, Take 180 mg by mouth daily with breakfast. Pt takes allegra for 30 days then zyrtec for 30 days, Disp: , Rfl:  glucosamine-chondroitin 500-400 MG tablet, Take 2 tablets by mouth daily. , Disp: , Rfl:    History  Substance Use Topics  . Smoking status: Former Smoker -- 1.00 packs/day for 2 years    Types: Cigarettes    Quit date: 05/03/1961  . Smokeless tobacco: Never Used  . Alcohol Use: Yes     Comment: occasional glass of wine     Family History  Problem Relation Age of Onset  . Heart disease Father      Review of Systems  Constitutional: Negative.   HENT: Negative.   Eyes: Negative.   Respiratory: Positive for shortness of breath.   Cardiovascular: Negative.   Gastrointestinal: Negative.   Genitourinary: Negative.   Musculoskeletal: Positive for joint pain.  Skin: Negative.   Neurological: Negative.   Endo/Heme/Allergies: Negative.   Psychiatric/Behavioral: Negative.     Objective:  Physical Exam   Constitutional: He is oriented to person, place, and time. He appears well-developed and well-nourished.  HENT:  Head: Normocephalic and atraumatic.  Eyes: Conjunctivae are normal. Pupils are equal, round, and reactive to light.  Neck: Normal range of motion. Neck supple.  Cardiovascular: Normal rate, regular rhythm and normal heart sounds.     Respiratory: Effort normal and breath sounds normal.  GI: Soft. Bowel sounds are normal.  Genitourinary:  Not pertinent to current symptomatology therefore not examined.  Musculoskeletal:  Examination of his left knee reveals pain on the medial joint line positive medial McMurray's mild varus deformity range of motion from 0-120 degrees knee is stable with normal patella tracking. Exam of his right knee reveals pain medially with moderate varus deformity 1 to 2+ crepitation 1+ synovitis range of motion 0-120 degrees knee is stable with normal patella tracking.  Vascular exam: pulses 2+ and symmetric.  Neurological: He is alert and oriented to person, place, and time.  Skin: Skin is warm and dry.  Psychiatric: He has a normal mood and affect. His behavior is normal.    Vital signs in last 24 hours: Last recorded: 07/01 1100   BP: 133/84 Pulse: 87  Temp: 98.2 F (36.8 C)    Height: 5' 11" (1.803 m) SpO2: 95  Weight: 99.338 kg (219 lb)     Labs:   Estimated body mass index is 30.56 kg/(m^2) as calculated from the following:   Height as of this encounter: 5' 11" (1.803 m).   Weight as of this encounter: 99.338 kg (219 lb).   Imaging Review Plain radiographs demonstrate severe degenerative joint disease of the left knee(s). The overall alignment ismild varus. The bone quality appears to be good for age and reported activity level.  Assessment/Plan:  End stage arthritis, left knee   The patient history, physical examination, clinical judgment of the provider and imaging studies are consistent with end stage degenerative joint disease of  the left knee(s) and total knee arthroplasty is deemed medically necessary. The treatment options including medical management, injection therapy arthroscopy and arthroplasty were discussed at length. The risks and benefits of total knee arthroplasty were presented and reviewed. The risks due to aseptic loosening, infection, stiffness, patella tracking problems, thromboembolic complications and other imponderables were discussed. The patient acknowledged the explanation, agreed to proceed with the plan and consent was signed. Patient is being admitted for inpatient treatment for surgery, pain control, PT, OT, prophylactic antibiotics, VTE prophylaxis, progressive ambulation and ADL's and discharge planning. The patient is planning to be discharged home with home health services  Shondrika Hoque A. Saphia Vanderford, PA-C Physician Assistant [facility] [phone]  10/31/2012, 12:34 PM   

## 2012-11-07 ENCOUNTER — Encounter (HOSPITAL_COMMUNITY)
Admission: RE | Admit: 2012-11-07 | Discharge: 2012-11-07 | Disposition: A | Discharge status: Home / Self Care | Payer: Medicare Other | Source: Ambulatory Visit | Attending: Physician Assistant | Admitting: Physician Assistant

## 2012-11-07 ENCOUNTER — Encounter (HOSPITAL_COMMUNITY)
Admission: RE | Admit: 2012-11-07 | Discharge: 2012-11-07 | Disposition: A | Discharge status: Home / Self Care | Payer: Medicare Other | Source: Ambulatory Visit | Attending: Orthopedic Surgery | Admitting: Orthopedic Surgery

## 2012-11-07 ENCOUNTER — Encounter (HOSPITAL_COMMUNITY): Payer: Self-pay

## 2012-11-07 DIAGNOSIS — Z01812 Encounter for preprocedural laboratory examination: Secondary | ICD-10-CM | POA: Insufficient documentation

## 2012-11-07 DIAGNOSIS — Z01818 Encounter for other preprocedural examination: Secondary | ICD-10-CM | POA: Insufficient documentation

## 2012-11-07 LAB — CBC WITH DIFFERENTIAL/PLATELET
Basophils Absolute: 0 10*3/uL (ref 0.0–0.1)
Eosinophils Absolute: 0.2 10*3/uL (ref 0.0–0.7)
Eosinophils Relative: 4 % (ref 0–5)
HCT: 42 % (ref 39.0–52.0)
Lymphocytes Relative: 31 % (ref 12–46)
MCH: 30 pg (ref 26.0–34.0)
MCV: 86.8 fL (ref 78.0–100.0)
Monocytes Absolute: 0.5 10*3/uL (ref 0.1–1.0)
RDW: 13.8 % (ref 11.5–15.5)
WBC: 5.7 10*3/uL (ref 4.0–10.5)

## 2012-11-07 LAB — PROTIME-INR: INR: 0.98 (ref 0.00–1.49)

## 2012-11-07 LAB — URINALYSIS, ROUTINE W REFLEX MICROSCOPIC
Glucose, UA: NEGATIVE mg/dL
Hgb urine dipstick: NEGATIVE
Ketones, ur: NEGATIVE mg/dL
Protein, ur: NEGATIVE mg/dL

## 2012-11-07 LAB — COMPREHENSIVE METABOLIC PANEL
AST: 22 U/L (ref 0–37)
CO2: 22 mEq/L (ref 19–32)
Calcium: 10.3 mg/dL (ref 8.4–10.5)
Creatinine, Ser: 1.29 mg/dL (ref 0.50–1.35)
GFR calc Af Amer: 62 mL/min — ABNORMAL LOW (ref >90)
GFR calc non Af Amer: 54 mL/min — ABNORMAL LOW (ref >90)
Glucose, Bld: 112 mg/dL — ABNORMAL HIGH (ref 70–99)

## 2012-11-07 LAB — TYPE AND SCREEN: ABO/RH(D): A POS

## 2012-11-07 MED ORDER — CHLORHEXIDINE GLUCONATE 4 % EX LIQD: 60.0000 mL | Freq: Once | CUTANEOUS | Status: DC

## 2012-11-07 MED ORDER — POVIDONE-IODINE 7.5 % EX SOLN: Freq: Once | CUTANEOUS | Status: DC

## 2012-11-07 NOTE — Pre-Procedure Instructions (Signed)
[name]  11/07/2012   Your procedure is scheduled on:  11/13/12  Report to [facility] at 530 AM.  Call this number if you have problems the morning of surgery: [phone]   Remember:   Do not eat food or drink liquids after midnight.   Take these medicines the morning of surgery with A SIP OF WATER: zyrtec,allegra,flonase,   Do not wear jewelry, make-up or nail polish.  Do not wear lotions, powders, or perfumes. You may wear deodorant.  Do not shave 48 hours prior to surgery. Men may shave face and neck.  Do not bring valuables to the hospital.  [facility] is not responsible                   for any belongings or valuables.  Contacts, dentures or bridgework may not be worn into surgery.  Leave suitcase in the car. After surgery it may be brought to your room.  For patients admitted to the hospital, checkout time is 11:00 AM the day of  discharge.   Patients discharged the day of surgery will not be allowed to drive  home.  Name and phone number of your driver: family  Special Instructions: Shower using CHG 2 nights before surgery and the night before surgery.  If you shower the day of surgery use CHG.  Use special wash - you have one bottle of CHG for all showers.  You should use approximately 1/3 of the bottle for each shower.   Please read over the following fact sheets that you were given: Pain Booklet, Coughing and Deep Breathing, MRSA Information and Surgical Site Infection Prevention

## 2012-11-07 NOTE — Pre-Procedure Instructions (Signed)
KENTA LASTER  11/07/2012   Your procedure is scheduled on:  11/13/12  Report to [facility] at 530 AM.  Call this number if you have problems the morning of surgery: [phone]   Remember:   Do not eat food or drink liquids after midnight.   Take these medicines the morning of surgery with A SIP OF WATER: zyrtec,flonase,prilosec   Do not wear jewelry, make-up or nail polish.  Do not wear lotions, powders, or perfumes. You may wear deodorant.  Do not shave 48 hours prior to surgery. Men may shave face and neck.  Do not bring valuables to the hospital.  [facility] is not responsible                   for any belongings or valuables.  Contacts, dentures or bridgework may not be worn into surgery.  Leave suitcase in the car. After surgery it may be brought to your room.  For patients admitted to the hospital, checkout time is 11:00 AM the day of  discharge.   Patients discharged the day of surgery will not be allowed to drive  home.  Name and phone number of your driver: family  Special Instructions: Shower using CHG 2 nights before surgery and the night before surgery.  If you shower the day of surgery use CHG.  Use special wash - you have one bottle of CHG for all showers.  You should use approximately 1/3 of the bottle for each shower.   Please read over the following fact sheets that you were given: Pain Booklet, Coughing and Deep Breathing, Blood Transfusion Information, MRSA Information and Surgical Site Infection Prevention

## 2012-11-07 NOTE — Progress Notes (Signed)
Donald Gray called for ekg and cardiac clearance

## 2012-11-08 ENCOUNTER — Encounter: Payer: Self-pay | Admitting: Internal Medicine

## 2012-11-08 ENCOUNTER — Ambulatory Visit (INDEPENDENT_AMBULATORY_CARE_PROVIDER_SITE_OTHER): Payer: Medicare Other | Admitting: Internal Medicine

## 2012-11-08 VITALS — BP 118/80 | HR 80 | Ht 71.0 in | Wt 218.2 lb

## 2012-11-08 DIAGNOSIS — R0989 Other specified symptoms and signs involving the circulatory and respiratory systems: Secondary | ICD-10-CM

## 2012-11-08 DIAGNOSIS — R0609 Other forms of dyspnea: Secondary | ICD-10-CM

## 2012-11-08 DIAGNOSIS — R0600 Dyspnea, unspecified: Secondary | ICD-10-CM

## 2012-11-08 NOTE — Progress Notes (Signed)
Anesthesia chart review: Patient is a 72 year old male scheduled for left uni-versus total knee replacement on 11/13/2012 by Dr. Thurston Hole. History includes former smoker, CAD/MI 1995 with RCA stent '97 [facility]), paroxysmal afib, hypertension, hyperlipidemia, diet controlled diabetes mellitus type 2, osteoarthritis, diverticulosis, GERD, left renal cancer status post nephrectomy with metastasis to the lung s/p IL-2 therapy followed by targeted therapy '12 in [place], chronic kidney disease stage III, obesity, bilateral inguinal hernia repairs '13, right shoulder replacement.  Pulmonologist is Dr. Marchelle Gearing, last visit 11/08/12 who is aware of plans for surgery.  PCP is listed as Dr. Kirby Funk.  Cardiologist is Dr. Verdis Prime. He recommended a nuclear stress test prior to TKR.  This was performed on 10/27/2012 and showed no scintigraphic evidence of inducible myocardial ischemia. The observed defect is consistent with prior nontransmural infarction and diaphragmatic attenuation. His stress EF 65%. Compared to previous study there is no significant change. Low risk scan. Ultimately patient was cleared for surgery from a cardiology standpoint.  Cardiopulmonary exercise test conclusion on 01/21/2012 showed: Exercise testing with gas exchange demonstrates a mild functional limitation. There is some chronotropic incompetence (likely related to the patient's beta-blockade), there is no other clear cardiac/circulatory limitation. In addition, obesity felt to be playing a role in dyspnea. There is no clear ventilatory limitation to the exercise.  Her last echo was on 04/28/2006 showed mild LV systolic dysfunction with EF 45-55%, normal left atrial size, normal valvular function, and no pericardial effusion.  EKG on 10/24/12 [facility]) showed ST @ 101 bpm.  Chest x-ray on 11/07/2012 showed no worrisome focal or acute cardiopulmonary abnormalities identified.  CT of the chest without contrast on 10/17/12 showed no  suspicious pulmonary nodules. Tiny calcified granuloma in the left upper chest. Pleural spaces are clear. No enlarged thoracic lymph nodes. Normal heart size. Again noted significant three-vessel coronary arterial calcifications. Bony thorax is intact. No evidence of metastatic disease in the chest.  Preoperative labs noted.  Urine culture is still pending.    It appears both his pulmonologist and cardiologist are on board with plans for surgery.  If no acute changes then I would anticipate that he could proceed as planned.  Velna Ochs [facility]/Anesthesiology Phone [phone] 11/08/2012 5:19 PM

## 2012-11-08 NOTE — Patient Instructions (Addendum)
Agree with efforts to condition knee and then start aerobic program Continue dietary efforst at weight loss Return as needed and/or  if still short of breath despite above efforts in few to several months

## 2012-11-08 NOTE — Assessment & Plan Note (Signed)
Agree with efforts to condition knee and then start aerobic program Continue dietary efforst at weight loss Return as needed and/or  if still short of breath despite above efforts in few to several months 

## 2012-11-08 NOTE — Progress Notes (Signed)
Subjective:    Patient ID: Donald Gray, male    DOB: 12/22/1940, 72 y.o.   MRN: [id number]  HPI PCP is Lillia Mountain, MD Body mass index is 30.46 kg/(m^2).  reports that he quit smoking about 50 years ago. His smoking use included Cigarettes. He has a 2 pack-year smoking history. He has never used smokeless tobacco.  Oncologist - Sherrill and Dr Nelson Chimes at [facility] ([email or web address]) Surgeon - Tsuei Urologist - Dahlstedt Cardiologist - Dr Garnette Scheuermann  IOV 12/09/2011 72 year old male. Has family hx of asthma. Diagnosed with renal cancer right kidney - s/p nephrectomy 9cm mass March 2011. 6 months later noted to have pulmonary mets that grew in Jan 2012. Then in [facility] Jan - March 2012 underwent IL-2 Rx. Then tried Sutent and anti PD1 March - Dec 2012. This helped resolve pulmonary mets. Course Complicated by renal failure of left kidney; now largely resolved. Currently 10/26/11 - no evidence of recurrence on CT.   His main pulmonary complaint is dyspnea. Concern is that anti PD-1 is causing dyspnea. Dyspneic since dec 2012 and since then worse gradually. Dyspneic with 1/4 mile exertion walking dog, or walking 3 flights without stopping or outdoor activities. Relived by resting for 1-2 minutes. Unable to condition himself due to dyspnea. Denies associated chest pain, but does have mild associated ace inhibitor cough (being maintained on it for renal protoection despite cought). Denies, orthopnea, paroxysmal nocturnal dyspnea, sputum, hemoptysis. No wheezing except one time when he climbed 3 flights of stairs. Has hx of cardiac cath [facility] in early 2012 - "2 x 40% blockages" . PFts back then normal.   Has hx of sinus drainage and allergies during pollen season  Currently weight is 216# but was 185# at time of cancer diagnosis but baseline weight is 240#  Note: rash in groin - local derm. Rx prednisone ended few days ago    Walk test 12/09/2011 185 feet x 3 laps: no  desats  Lab  Hgb 12.5gm% - 11/25/11  CT chest Jan 2012  Comparison is made with CT of the chest performed at at [facility]  urology 02/13/2010. Since that exam, multiple lesions in the right  lung are larger. Small left lung lesions are new. Index lesion in  the right lung apex previously measured 7 mm and now measures 12  mm. Right lateral upper lobe lesion previously measured 5 mm and  now measures 11 mm. Retrocardiac lesion in the right lower lobe  previously measured 10 mm and now measures 11 x 17 mm. Nodules in  the left upper lobe measure 5 mm (image 13), 5 mm (image 26).  Nodule in the left lower lobe measures 4 mm on image 40.  Findings are discussed with Dr. Truett Perna.   Addendum Ends  Clinical Data: Follow-up lung nodules.  CT CHEST WITHOUT CONTRAST  Technique: Multidetector CT imaging of the chest was performed  following the standard protocol without IV contrast.  Comparison: 07/18/2009  Findings: No pathologically enlarged mediastinal or axillary lymph  nodes. Hilar regions are difficult to definitively evaluate  without IV contrast. Extensive coronary artery calcification.  Heart size normal. No pericardial effusion. Bilateral  gynecomastia.  There are new pulmonary nodules bilaterally, measuring up to 1.1 x  1.7 cm in the medial right lower lobe (image 37). A tiny calcified  granuloma in the peripheral left upper lobe is again noted. No  pleural fluid. Airway is unremarkable.  Incidental imaging of the upper abdomen shows a small ventral  hernia containing fat. Right nephrectomy. No worrisome lytic or  sclerotic lesions.  IMPRESSION:  New pulmonary metastatic disease.  CT chest October 26, 2011 (outside facility, images NA)  - per report - LUL granuloma unchanged from Jan 2012. No other pulmonary nodules. No metastatic disease  CT abd/pelvis October 26, 2011  - s/p nephrectomy. No evidence of cancer recurrence  REC Please have full PFT breathing test  After test  done, please give office [phone] a call to review results  Based on results, you might need CPST pulmonary stress test  OV 01/21/12  FU dyspnea  In interim, no change in dyspnea. No new health issues. I d/with Dr Nelson Chimes his oncologist and per him the PD-1 antibody drug causes pneumonitis which is absent in this patient as of June 2013 CT chest. His PFTs were normal. So, he underwent CPST on bike 01/10/12. Results are reviewed below:  CPX: The RER of 1.16 indicates a maximal effort. The peak VO2 is mildly reduced at 16.8 ml/kg/min (79% of the age/gender/weight matched sedentary norm). When adjusted to the patients ideal body weight of 180 lb (81.8 kg) the peak VO2 is 20.2 ml/kg (ibw)/min (83% of the IBW adjusted predicted).  This tendency to correct to normal suggests obesity might be playing a role in dyspnea. The VO2 a the ventilatory threshold is normal at 53% of the predicted peak VO2. At peak exercise the ventilation was 53% of the measured MVV indicating ventilatory reserve remained. Theree was an inadequate HR response to the exercise with a HR reserve of 28 bpm. The O2pulse (a surrogate for stroke volume) increased throughout the exercise reaching 14 ml/beat (100% of predicted). The results suggests chronotropic incompetence on account of beta blockade and also obesity causing dyspnea   Past, Family, Social reviewed: no change since last visit   REC Your shortness of breath is due to weight, fitness and also lopressor being excessive My office will coordinate an appointment with Dr Garnette Scheuermann for you to cut down or eliminate your lopressor Please join your gym and work on fitness Please work on eating low glycemic foods - see the sheet I gave Return in 6 months - aim to lose weigh 1# per month   OV 11/08/2012  Fu dyspnea and weight mgmt  HE really hsa nnot lost much weight but he says he has lost 2# per month following my dietary advice. He has had knee issues through with left knee  and severe pain and this has impaired ability to exercise. He is due for knee surgery tomorrow. He plans to do rehab and then aerobics once knee is fixed. This will also help him be more compliant with diet. No new issues. Dyspnea stil persists  Estimated body mass index is 30.45 kg/(m^2) as calculated from the following:   Height as of this encounter: 5\' 11"  (1.803 m).   Weight as of this encounter: 218 lb 3.2 oz (98.975 kg).   Past, Family, Social reviewed: no change since last visit  Review of Systems  Constitutional: Negative for fever and unexpected weight change.  HENT: Negative for ear pain, nosebleeds, congestion, sore throat, rhinorrhea, sneezing, trouble swallowing, dental problem, postnasal drip and sinus pressure.   Eyes: Negative for redness and itching.  Respiratory: Positive for shortness of breath. Negative for cough, chest tightness and wheezing.   Cardiovascular: Negative for palpitations and leg swelling.  Gastrointestinal: Negative for nausea and vomiting.  Genitourinary: Negative for dysuria.  Musculoskeletal: Negative for joint swelling.  Skin:  Negative for rash.  Neurological: Negative for headaches.  Hematological: Does not bruise/bleed easily.  Psychiatric/Behavioral: Negative for dysphoric mood. The patient is not nervous/anxious.        Objective:   Physical Exam  Physical Exam Nursing note and vitals reviewed. Constitutional: He is oriented to person, place, and time. He appears well-developed and well-nourished. No distress.  Body mass index is 30.45 kg/(m^2).    HENT:  Head: Normocephalic and atraumatic.  Right Ear: External ear normal.  Left Ear: External ear normal.  Mouth/Throat: Oropharynx is clear and moist. No oropharyngeal exudate.   Eyes: Conjunctivae and EOM are normal. Pupils are equal, round, and reactive to light. Right eye exhibits no discharge. Left eye exhibits no discharge. No scleral icterus.  Neck: Normal range of motion. Neck  supple. No JVD present. No tracheal deviation present. No thyromegaly present.  Cardiovascular: Normal rate, regular rhythm and intact distal pulses.  Exam reveals no gallop and no friction rub.   No murmur heard. Pulmonary/Chest: Effort normal and breath sounds normal. No respiratory distress. He has no wheezes. He has no rales. He exhibits no tenderness.  Abdominal: Soft. Bowel sounds are normal. He exhibits no distension and no mass. There is no tenderness. There is no rebound and no guarding.  Musculoskeletal: Normal range of motion. He exhibits no edema and no tenderness.  Lymphadenopathy:    He has no cervical adenopathy.  Neurological: He is alert and oriented to person, place, and time. He has normal reflexes. No cranial nerve deficit. Coordination normal.  Skin: Skin is warm and dry. No rash noted. He is not diaphoretic. No erythema. No pallor.  Psychiatric: He has a normal mood and affect. His behavior is normal. Judgment and thought content normal.             Assessment & Plan:

## 2012-11-09 LAB — URINE CULTURE
Colony Count: NO GROWTH
Culture: NO GROWTH

## 2012-11-12 MED ORDER — VANCOMYCIN HCL 10 G IV SOLR
1500.0000 mg | INTRAVENOUS | Status: AC
  Administered 2012-11-13: 1500 mg via INTRAVENOUS
  Filled 2012-11-12: qty 1500

## 2012-11-12 MED ORDER — LACTATED RINGERS IV SOLN: INTRAVENOUS | Status: DC

## 2012-11-13 ENCOUNTER — Inpatient Hospital Stay (HOSPITAL_COMMUNITY)
Admission: RE | Admit: 2012-11-13 | Discharge: 2012-11-14 | DRG: 470 | Disposition: A | Discharge status: Home Health | Payer: Medicare Other | Source: Ambulatory Visit | Attending: Orthopedic Surgery | Admitting: Orthopedic Surgery

## 2012-11-13 ENCOUNTER — Encounter (HOSPITAL_COMMUNITY)
Admission: RE | Disposition: A | Discharge status: Home Health | Payer: Self-pay | Source: Ambulatory Visit | Attending: Orthopedic Surgery

## 2012-11-13 ENCOUNTER — Encounter (HOSPITAL_COMMUNITY): Payer: Self-pay | Admitting: *Deleted

## 2012-11-13 ENCOUNTER — Encounter (HOSPITAL_COMMUNITY): Payer: Self-pay | Admitting: Vascular Surgery

## 2012-11-13 ENCOUNTER — Ambulatory Visit (HOSPITAL_COMMUNITY): Payer: Medicare Other | Admitting: Anesthesiology

## 2012-11-13 DIAGNOSIS — Z880 Allergy status to penicillin: Secondary | ICD-10-CM

## 2012-11-13 DIAGNOSIS — E119 Type 2 diabetes mellitus without complications: Secondary | ICD-10-CM | POA: Diagnosis present

## 2012-11-13 DIAGNOSIS — C801 Malignant (primary) neoplasm, unspecified: Secondary | ICD-10-CM | POA: Diagnosis present

## 2012-11-13 DIAGNOSIS — N289 Disorder of kidney and ureter, unspecified: Secondary | ICD-10-CM | POA: Diagnosis present

## 2012-11-13 DIAGNOSIS — Z7982 Long term (current) use of aspirin: Secondary | ICD-10-CM

## 2012-11-13 DIAGNOSIS — E785 Hyperlipidemia, unspecified: Secondary | ICD-10-CM | POA: Diagnosis present

## 2012-11-13 DIAGNOSIS — K219 Gastro-esophageal reflux disease without esophagitis: Secondary | ICD-10-CM | POA: Diagnosis present

## 2012-11-13 DIAGNOSIS — C7800 Secondary malignant neoplasm of unspecified lung: Secondary | ICD-10-CM | POA: Diagnosis present

## 2012-11-13 DIAGNOSIS — K5790 Diverticulosis of intestine, part unspecified, without perforation or abscess without bleeding: Secondary | ICD-10-CM | POA: Diagnosis present

## 2012-11-13 DIAGNOSIS — R0600 Dyspnea, unspecified: Secondary | ICD-10-CM | POA: Diagnosis present

## 2012-11-13 DIAGNOSIS — Z96619 Presence of unspecified artificial shoulder joint: Secondary | ICD-10-CM

## 2012-11-13 DIAGNOSIS — Z85528 Personal history of other malignant neoplasm of kidney: Secondary | ICD-10-CM

## 2012-11-13 DIAGNOSIS — M1710 Unilateral primary osteoarthritis, unspecified knee: Principal | ICD-10-CM | POA: Diagnosis present

## 2012-11-13 DIAGNOSIS — Z9861 Coronary angioplasty status: Secondary | ICD-10-CM

## 2012-11-13 DIAGNOSIS — H9190 Unspecified hearing loss, unspecified ear: Secondary | ICD-10-CM | POA: Diagnosis present

## 2012-11-13 DIAGNOSIS — Z905 Acquired absence of kidney: Secondary | ICD-10-CM

## 2012-11-13 DIAGNOSIS — I25119 Atherosclerotic heart disease of native coronary artery with unspecified angina pectoris: Secondary | ICD-10-CM | POA: Diagnosis present

## 2012-11-13 DIAGNOSIS — I10 Essential (primary) hypertension: Secondary | ICD-10-CM | POA: Diagnosis present

## 2012-11-13 DIAGNOSIS — I252 Old myocardial infarction: Secondary | ICD-10-CM

## 2012-11-13 DIAGNOSIS — I129 Hypertensive chronic kidney disease with stage 1 through stage 4 chronic kidney disease, or unspecified chronic kidney disease: Secondary | ICD-10-CM | POA: Diagnosis present

## 2012-11-13 DIAGNOSIS — Z8249 Family history of ischemic heart disease and other diseases of the circulatory system: Secondary | ICD-10-CM

## 2012-11-13 DIAGNOSIS — K5730 Diverticulosis of large intestine without perforation or abscess without bleeding: Secondary | ICD-10-CM | POA: Diagnosis present

## 2012-11-13 DIAGNOSIS — N183 Chronic kidney disease, stage 3 unspecified: Secondary | ICD-10-CM | POA: Diagnosis present

## 2012-11-13 DIAGNOSIS — M1990 Unspecified osteoarthritis, unspecified site: Secondary | ICD-10-CM | POA: Diagnosis present

## 2012-11-13 DIAGNOSIS — Z79899 Other long term (current) drug therapy: Secondary | ICD-10-CM

## 2012-11-13 DIAGNOSIS — Z87891 Personal history of nicotine dependence: Secondary | ICD-10-CM

## 2012-11-13 DIAGNOSIS — M1712 Unilateral primary osteoarthritis, left knee: Secondary | ICD-10-CM | POA: Diagnosis present

## 2012-11-13 DIAGNOSIS — Z9221 Personal history of antineoplastic chemotherapy: Secondary | ICD-10-CM

## 2012-11-13 DIAGNOSIS — Z6830 Body mass index (BMI) 30.0-30.9, adult: Secondary | ICD-10-CM

## 2012-11-13 DIAGNOSIS — I2510 Atherosclerotic heart disease of native coronary artery without angina pectoris: Secondary | ICD-10-CM | POA: Diagnosis present

## 2012-11-13 DIAGNOSIS — E669 Obesity, unspecified: Secondary | ICD-10-CM | POA: Diagnosis present

## 2012-11-13 DIAGNOSIS — K4020 Bilateral inguinal hernia, without obstruction or gangrene, not specified as recurrent: Secondary | ICD-10-CM | POA: Diagnosis present

## 2012-11-13 HISTORY — PX: TOTAL KNEE ARTHROPLASTY: SHX125

## 2012-11-13 HISTORY — PX: KNEE ARTHROSCOPY: SHX127

## 2012-11-13 LAB — GLUCOSE, CAPILLARY
Glucose-Capillary: 126 mg/dL — ABNORMAL HIGH (ref 70–99)
Glucose-Capillary: 133 mg/dL — ABNORMAL HIGH (ref 70–99)
Glucose-Capillary: 215 mg/dL — ABNORMAL HIGH (ref 70–99)
Glucose-Capillary: 227 mg/dL — ABNORMAL HIGH (ref 70–99)

## 2012-11-13 LAB — HEMOGLOBIN A1C
Hgb A1c MFr Bld: 6.4 % — ABNORMAL HIGH (ref <5.7)
Mean Plasma Glucose: 137 mg/dL — ABNORMAL HIGH (ref <117)

## 2012-11-13 SURGERY — ARTHROSCOPY, KNEE
Anesthesia: General | Site: Knee | Laterality: Left | Wound class: Clean

## 2012-11-13 MED ORDER — DOCUSATE SODIUM 100 MG PO CAPS
100.0000 mg | ORAL_CAPSULE | Freq: Two times a day (BID) | ORAL | Status: DC
  Administered 2012-11-13 – 2012-11-14 (×3): 100 mg via ORAL
  Filled 2012-11-13 (×4): qty 1

## 2012-11-13 MED ORDER — OXYCODONE HCL 5 MG PO TABS
5.0000 mg | ORAL_TABLET | ORAL | Status: DC | PRN
  Administered 2012-11-13 – 2012-11-14 (×3): 10 mg via ORAL
  Filled 2012-11-13 (×3): qty 2

## 2012-11-13 MED ORDER — ONDANSETRON HCL 4 MG/2ML IJ SOLN: 4.0000 mg | Freq: Four times a day (QID) | INTRAMUSCULAR | Status: DC | PRN

## 2012-11-13 MED ORDER — LIDOCAINE HCL (CARDIAC) 20 MG/ML IV SOLN
INTRAVENOUS | Status: DC | PRN
  Administered 2012-11-13: 80 mg via INTRAVENOUS

## 2012-11-13 MED ORDER — DIPHENHYDRAMINE HCL 12.5 MG/5ML PO ELIX
12.5000 mg | ORAL_SOLUTION | ORAL | Status: DC | PRN
  Administered 2012-11-13 (×2): 25 mg via ORAL
  Filled 2012-11-13 (×2): qty 10

## 2012-11-13 MED ORDER — ROCURONIUM BROMIDE 100 MG/10ML IV SOLN
INTRAVENOUS | Status: DC | PRN
  Administered 2012-11-13: 20 mg via INTRAVENOUS
  Administered 2012-11-13: 10 mg via INTRAVENOUS
  Administered 2012-11-13: 40 mg via INTRAVENOUS

## 2012-11-13 MED ORDER — AZELASTINE HCL 0.1 % NA SOLN
1.0000 | Freq: Two times a day (BID) | NASAL | Status: DC | PRN
  Filled 2012-11-13: qty 30

## 2012-11-13 MED ORDER — ASPIRIN EC 325 MG PO TBEC
325.0000 mg | DELAYED_RELEASE_TABLET | Freq: Two times a day (BID) | ORAL | Status: DC
  Administered 2012-11-13 – 2012-11-14 (×2): 325 mg via ORAL
  Filled 2012-11-13 (×4): qty 1

## 2012-11-13 MED ORDER — BISACODYL 5 MG PO TBEC
10.0000 mg | DELAYED_RELEASE_TABLET | Freq: Every day | ORAL | Status: DC
  Administered 2012-11-13: 10 mg via ORAL
  Filled 2012-11-13: qty 2

## 2012-11-13 MED ORDER — PHENOL 1.4 % MT LIQD: 1.0000 | OROMUCOSAL | Status: DC | PRN

## 2012-11-13 MED ORDER — HYDROMORPHONE HCL PF 1 MG/ML IJ SOLN
0.5000 mg | INTRAMUSCULAR | Status: DC | PRN
Start: 2012-11-13 — End: 2012-11-14
  Administered 2012-11-13 (×2): 1 mg via INTRAVENOUS
  Filled 2012-11-13 (×2): qty 1

## 2012-11-13 MED ORDER — DEXAMETHASONE 6 MG PO TABS
10.0000 mg | ORAL_TABLET | Freq: Three times a day (TID) | ORAL | Status: AC
  Administered 2012-11-13 – 2012-11-14 (×3): 10 mg via ORAL
  Filled 2012-11-13 (×3): qty 1

## 2012-11-13 MED ORDER — HYDROMORPHONE HCL PF 1 MG/ML IJ SOLN
INTRAMUSCULAR | Status: AC
  Administered 2012-11-13: 0.5 mg via INTRAVENOUS
  Filled 2012-11-13: qty 1

## 2012-11-13 MED ORDER — GLYCOPYRROLATE 0.2 MG/ML IJ SOLN
INTRAMUSCULAR | Status: DC | PRN
  Administered 2012-11-13 (×2): 0.3 mg via INTRAVENOUS

## 2012-11-13 MED ORDER — ALUM & MAG HYDROXIDE-SIMETH 200-200-20 MG/5ML PO SUSP: 30.0000 mL | ORAL | Status: DC | PRN

## 2012-11-13 MED ORDER — DEXAMETHASONE SODIUM PHOSPHATE 4 MG/ML IJ SOLN
INTRAMUSCULAR | Status: DC | PRN
  Administered 2012-11-13: 8 mg via INTRAVENOUS

## 2012-11-13 MED ORDER — ATORVASTATIN CALCIUM 10 MG PO TABS
10.0000 mg | ORAL_TABLET | Freq: Every day | ORAL | Status: DC
  Administered 2012-11-14: 10 mg via ORAL
  Filled 2012-11-13 (×2): qty 1

## 2012-11-13 MED ORDER — NEOSTIGMINE METHYLSULFATE 1 MG/ML IJ SOLN
INTRAMUSCULAR | Status: DC | PRN
  Administered 2012-11-13 (×2): 2 mg via INTRAVENOUS

## 2012-11-13 MED ORDER — SODIUM CHLORIDE 0.9 % IR SOLN
Status: DC | PRN
  Administered 2012-11-13: 3000 mL

## 2012-11-13 MED ORDER — EPHEDRINE SULFATE 50 MG/ML IJ SOLN
INTRAMUSCULAR | Status: DC | PRN
  Administered 2012-11-13: 10 mg via INTRAVENOUS

## 2012-11-13 MED ORDER — VANCOMYCIN HCL IN DEXTROSE 1-5 GM/200ML-% IV SOLN
1000.0000 mg | Freq: Two times a day (BID) | INTRAVENOUS | Status: AC
  Administered 2012-11-13: 1000 mg via INTRAVENOUS
  Filled 2012-11-13: qty 200

## 2012-11-13 MED ORDER — METOCLOPRAMIDE HCL 5 MG/ML IJ SOLN: 5.0000 mg | Freq: Three times a day (TID) | INTRAMUSCULAR | Status: DC | PRN

## 2012-11-13 MED ORDER — PHENYLEPHRINE HCL 10 MG/ML IJ SOLN
INTRAMUSCULAR | Status: DC | PRN
  Administered 2012-11-13 (×5): 80 ug via INTRAVENOUS

## 2012-11-13 MED ORDER — ONDANSETRON HCL 4 MG/2ML IJ SOLN
INTRAMUSCULAR | Status: DC | PRN
  Administered 2012-11-13: 4 mg via INTRAVENOUS

## 2012-11-13 MED ORDER — INSULIN ASPART 100 UNIT/ML ~~LOC~~ SOLN: 0.0000 [IU] | Freq: Every day | SUBCUTANEOUS | Status: DC

## 2012-11-13 MED ORDER — POTASSIUM CHLORIDE IN NACL 20-0.9 MEQ/L-% IV SOLN
INTRAVENOUS | Status: DC
  Administered 2012-11-13 – 2012-11-14 (×2): via INTRAVENOUS
  Filled 2012-11-13 (×4): qty 1000

## 2012-11-13 MED ORDER — VITAMIN C 500 MG PO TABS
500.0000 mg | ORAL_TABLET | Freq: Every day | ORAL | Status: DC
  Administered 2012-11-13: 500 mg via ORAL
  Filled 2012-11-13 (×2): qty 1

## 2012-11-13 MED ORDER — LACTATED RINGERS IV SOLN
INTRAVENOUS | Status: DC | PRN
  Administered 2012-11-13 (×2): via INTRAVENOUS

## 2012-11-13 MED ORDER — DEXAMETHASONE SODIUM PHOSPHATE 10 MG/ML IJ SOLN
10.0000 mg | Freq: Three times a day (TID) | INTRAMUSCULAR | Status: AC
  Filled 2012-11-13 (×3): qty 1

## 2012-11-13 MED ORDER — WHITE PETROLATUM GEL
Status: AC
  Administered 2012-11-13: 15:00:00
  Filled 2012-11-13: qty 5

## 2012-11-13 MED ORDER — FENTANYL CITRATE 0.05 MG/ML IJ SOLN
INTRAMUSCULAR | Status: DC | PRN
  Administered 2012-11-13: 100 ug via INTRAVENOUS
  Administered 2012-11-13: 50 ug via INTRAVENOUS
  Administered 2012-11-13: 100 ug via INTRAVENOUS
  Administered 2012-11-13: 50 ug via INTRAVENOUS
  Administered 2012-11-13: 100 ug via INTRAVENOUS
  Administered 2012-11-13 (×2): 50 ug via INTRAVENOUS
  Administered 2012-11-13: 100 ug via INTRAVENOUS
  Administered 2012-11-13: 50 ug via INTRAVENOUS

## 2012-11-13 MED ORDER — ACETAMINOPHEN 325 MG PO TABS: 650.0000 mg | ORAL_TABLET | Freq: Four times a day (QID) | ORAL | Status: DC | PRN

## 2012-11-13 MED ORDER — LIDOCAINE HCL 4 % MT SOLN
OROMUCOSAL | Status: DC | PRN
  Administered 2012-11-13: 4 mL via TOPICAL

## 2012-11-13 MED ORDER — VITAMIN D3 25 MCG (1000 UNIT) PO TABS
1000.0000 [IU] | ORAL_TABLET | Freq: Every day | ORAL | Status: DC
  Administered 2012-11-13 – 2012-11-14 (×2): 1000 [IU] via ORAL
  Filled 2012-11-13 (×2): qty 1

## 2012-11-13 MED ORDER — FAMOTIDINE 20 MG PO TABS
20.0000 mg | ORAL_TABLET | Freq: Two times a day (BID) | ORAL | Status: DC
  Administered 2012-11-13 – 2012-11-14 (×3): 20 mg via ORAL
  Filled 2012-11-13 (×4): qty 1

## 2012-11-13 MED ORDER — SODIUM CHLORIDE 0.9 % IV SOLN
INTRAVENOUS | Status: DC | PRN
  Administered 2012-11-13: 07:00:00 via INTRAVENOUS

## 2012-11-13 MED ORDER — ONDANSETRON HCL 4 MG/2ML IJ SOLN: 4.0000 mg | Freq: Once | INTRAMUSCULAR | Status: DC | PRN

## 2012-11-13 MED ORDER — SODIUM CHLORIDE 0.9 % IV SOLN
10.0000 mg | INTRAVENOUS | Status: DC | PRN
  Administered 2012-11-13: 25 ug/min via INTRAVENOUS

## 2012-11-13 MED ORDER — ADULT MULTIVITAMIN W/MINERALS CH
1.0000 | ORAL_TABLET | Freq: Every day | ORAL | Status: DC
  Administered 2012-11-13 – 2012-11-14 (×2): 1 via ORAL
  Filled 2012-11-13 (×2): qty 1

## 2012-11-13 MED ORDER — CELECOXIB 200 MG PO CAPS
200.0000 mg | ORAL_CAPSULE | Freq: Two times a day (BID) | ORAL | Status: DC
  Administered 2012-11-13 – 2012-11-14 (×3): 200 mg via ORAL
  Filled 2012-11-13 (×4): qty 1

## 2012-11-13 MED ORDER — HYDROMORPHONE HCL PF 1 MG/ML IJ SOLN
INTRAMUSCULAR | Status: AC
  Administered 2012-11-13: 1 mg
  Filled 2012-11-13: qty 1

## 2012-11-13 MED ORDER — FLUTICASONE PROPIONATE 50 MCG/ACT NA SUSP
2.0000 | Freq: Every day | NASAL | Status: DC
  Administered 2012-11-13 – 2012-11-14 (×2): 2 via NASAL
  Filled 2012-11-13: qty 16

## 2012-11-13 MED ORDER — MAGNESIUM GLUCONATE 500 MG PO TABS
500.0000 mg | ORAL_TABLET | Freq: Two times a day (BID) | ORAL | Status: DC
  Administered 2012-11-13: 500 mg via ORAL
  Administered 2012-11-13 – 2012-11-14 (×2): via ORAL
  Filled 2012-11-13 (×5): qty 1

## 2012-11-13 MED ORDER — BUPIVACAINE-EPINEPHRINE PF 0.25-1:200000 % IJ SOLN
INTRAMUSCULAR | Status: AC
  Filled 2012-11-13: qty 30

## 2012-11-13 MED ORDER — MIDAZOLAM HCL 5 MG/5ML IJ SOLN
INTRAMUSCULAR | Status: DC | PRN
  Administered 2012-11-13 (×2): 1 mg via INTRAVENOUS

## 2012-11-13 MED ORDER — LISINOPRIL 20 MG PO TABS
20.0000 mg | ORAL_TABLET | Freq: Two times a day (BID) | ORAL | Status: DC
  Administered 2012-11-13 – 2012-11-14 (×2): 20 mg via ORAL
  Filled 2012-11-13 (×4): qty 1

## 2012-11-13 MED ORDER — PROPOFOL 10 MG/ML IV BOLUS
INTRAVENOUS | Status: DC | PRN
  Administered 2012-11-13: 200 mg via INTRAVENOUS

## 2012-11-13 MED ORDER — 0.9 % SODIUM CHLORIDE (POUR BTL) OPTIME
TOPICAL | Status: DC | PRN
  Administered 2012-11-13: 1000 mL

## 2012-11-13 MED ORDER — PREGABALIN 50 MG PO CAPS
75.0000 mg | ORAL_CAPSULE | Freq: Every day | ORAL | Status: DC
  Filled 2012-11-13: qty 1

## 2012-11-13 MED ORDER — MENTHOL 3 MG MT LOZG: 1.0000 | LOZENGE | OROMUCOSAL | Status: DC | PRN

## 2012-11-13 MED ORDER — ZOLPIDEM TARTRATE 5 MG PO TABS: 5.0000 mg | ORAL_TABLET | Freq: Every evening | ORAL | Status: DC | PRN

## 2012-11-13 MED ORDER — METOCLOPRAMIDE HCL 10 MG PO TABS
5.0000 mg | ORAL_TABLET | Freq: Three times a day (TID) | ORAL | Status: DC | PRN
Start: 2012-11-13 — End: 2012-11-14

## 2012-11-13 MED ORDER — LORATADINE 10 MG PO TABS
10.0000 mg | ORAL_TABLET | Freq: Every day | ORAL | Status: DC
  Administered 2012-11-14: 10 mg via ORAL
  Filled 2012-11-13 (×3): qty 1

## 2012-11-13 MED ORDER — INSULIN ASPART 100 UNIT/ML ~~LOC~~ SOLN
0.0000 [IU] | Freq: Three times a day (TID) | SUBCUTANEOUS | Status: DC
  Administered 2012-11-13: 5 [IU] via SUBCUTANEOUS
  Administered 2012-11-14: 3 [IU] via SUBCUTANEOUS
  Administered 2012-11-14: 12:00:00 via SUBCUTANEOUS

## 2012-11-13 MED ORDER — HYDROMORPHONE HCL PF 1 MG/ML IJ SOLN
0.2500 mg | INTRAMUSCULAR | Status: DC | PRN
  Administered 2012-11-13: 0.5 mg via INTRAVENOUS

## 2012-11-13 MED ORDER — ACETAMINOPHEN 650 MG RE SUPP: 650.0000 mg | Freq: Four times a day (QID) | RECTAL | Status: DC | PRN

## 2012-11-13 MED ORDER — BUPIVACAINE-EPINEPHRINE 0.25% -1:200000 IJ SOLN
INTRAMUSCULAR | Status: DC | PRN
  Administered 2012-11-13: 30 mL

## 2012-11-13 MED ORDER — EPINEPHRINE 0.3 MG/0.3ML IJ SOAJ: 0.3000 mg | INTRAMUSCULAR | Status: DC | PRN

## 2012-11-13 MED ORDER — PREGABALIN 25 MG PO CAPS: 75.0000 mg | ORAL_CAPSULE | Freq: Two times a day (BID) | ORAL | Status: DC

## 2012-11-13 MED ORDER — ONDANSETRON HCL 4 MG PO TABS: 4.0000 mg | ORAL_TABLET | Freq: Four times a day (QID) | ORAL | Status: DC | PRN

## 2012-11-13 SURGICAL SUPPLY — 86 items
BANDAGE ELASTIC 4 VELCRO ST LF (GAUZE/BANDAGES/DRESSINGS) ×3 IMPLANT
BANDAGE ELASTIC 6 VELCRO ST LF (GAUZE/BANDAGES/DRESSINGS) ×3 IMPLANT
BANDAGE ESMARK 6X9 LF (GAUZE/BANDAGES/DRESSINGS) ×2 IMPLANT
BLADE CUDA 5.5 (BLADE) IMPLANT
BLADE CUTTER GATOR 3.5 (BLADE) IMPLANT
BLADE GREAT WHITE 4.2 (BLADE) ×3 IMPLANT
BLADE SURG 10 STRL SS (BLADE) ×3 IMPLANT
BLADE SURG 11 STRL SS (BLADE) ×3 IMPLANT
BNDG ESMARK 6X9 LF (GAUZE/BANDAGES/DRESSINGS) ×3
BOWL SMART MIX CTS (DISPOSABLE) ×3 IMPLANT
BUR OVAL 6.0 (BURR) IMPLANT
CAPT RP KNEE ×3 IMPLANT
CEMENT HV SMART SET (Cement) ×6 IMPLANT
CLOTH BEACON ORANGE TIMEOUT ST (SAFETY) ×6 IMPLANT
COVER BACK TABLE 24X17X13 BIG (DRAPES) IMPLANT
COVER SURGICAL LIGHT HANDLE (MISCELLANEOUS) ×3 IMPLANT
CUFF TOURNIQUET SINGLE 34IN LL (TOURNIQUET CUFF) ×3 IMPLANT
CUFF TOURNIQUET SINGLE 44IN (TOURNIQUET CUFF) IMPLANT
DRAPE ARTHROSCOPY W/POUCH 114 (DRAPES) IMPLANT
DRAPE EXTREMITY T 121X128X90 (DRAPE) ×3 IMPLANT
DRAPE INCISE IOBAN 66X45 STRL (DRAPES) IMPLANT
DRAPE PROXIMA HALF (DRAPES) ×3 IMPLANT
DRAPE U-SHAPE 47X51 STRL (DRAPES) ×3 IMPLANT
DRSG ADAPTIC 3X8 NADH LF (GAUZE/BANDAGES/DRESSINGS) ×3 IMPLANT
DRSG PAD ABDOMINAL 8X10 ST (GAUZE/BANDAGES/DRESSINGS) ×3 IMPLANT
DURAPREP 26ML APPLICATOR (WOUND CARE) ×6 IMPLANT
ELECT CAUTERY BLADE 6.4 (BLADE) ×3 IMPLANT
ELECT MENISCUS 165MM 90D (ELECTRODE) IMPLANT
ELECT REM PT RETURN 9FT ADLT (ELECTROSURGICAL) ×3
ELECTRODE REM PT RTRN 9FT ADLT (ELECTROSURGICAL) ×2 IMPLANT
EVACUATOR 1/8 PVC DRAIN (DRAIN) ×3 IMPLANT
FACESHIELD LNG OPTICON STERILE (SAFETY) ×3 IMPLANT
FILTER STRAW FLUID ASPIR (MISCELLANEOUS) IMPLANT
GAUZE XEROFORM 1X8 LF (GAUZE/BANDAGES/DRESSINGS) ×3 IMPLANT
GLOVE BIO SURGEON STRL SZ7 (GLOVE) ×3 IMPLANT
GLOVE BIOGEL PI IND STRL 7.0 (GLOVE) ×4 IMPLANT
GLOVE BIOGEL PI IND STRL 7.5 (GLOVE) ×2 IMPLANT
GLOVE BIOGEL PI IND STRL 8 (GLOVE) ×4 IMPLANT
GLOVE BIOGEL PI INDICATOR 7.0 (GLOVE) ×2
GLOVE BIOGEL PI INDICATOR 7.5 (GLOVE) ×1
GLOVE BIOGEL PI INDICATOR 8 (GLOVE) ×2
GLOVE BIOGEL PI ORTHO PRO 7.5 (GLOVE) ×1
GLOVE PI ORTHO PRO STRL 7.5 (GLOVE) ×2 IMPLANT
GLOVE SS BIOGEL STRL SZ 7.5 (GLOVE) ×2 IMPLANT
GLOVE SUPERSENSE BIOGEL SZ 7.5 (GLOVE) ×1
GOWN PREVENTION PLUS XLARGE (GOWN DISPOSABLE) ×9 IMPLANT
GOWN STRL NON-REIN LRG LVL3 (GOWN DISPOSABLE) ×9 IMPLANT
GOWN STRL REIN XL XLG (GOWN DISPOSABLE) ×3 IMPLANT
HANDPIECE INTERPULSE COAX TIP (DISPOSABLE) ×1
HOOD PEEL AWAY FACE SHEILD DIS (HOOD) ×12 IMPLANT
IMMOBILIZER KNEE 22 UNIV (SOFTGOODS) ×6 IMPLANT
KIT BASIN OR (CUSTOM PROCEDURE TRAY) ×3 IMPLANT
KIT ROOM TURNOVER OR (KITS) ×3 IMPLANT
MANIFOLD NEPTUNE II (INSTRUMENTS) ×3 IMPLANT
NEEDLE 18GX1X1/2 (RX/OR ONLY) (NEEDLE) IMPLANT
NS IRRIG 1000ML POUR BTL (IV SOLUTION) ×3 IMPLANT
PACK ARTHROSCOPY DSU (CUSTOM PROCEDURE TRAY) IMPLANT
PACK TOTAL JOINT (CUSTOM PROCEDURE TRAY) ×3 IMPLANT
PAD ARMBOARD 7.5X6 YLW CONV (MISCELLANEOUS) ×6 IMPLANT
PAD CAST 4YDX4 CTTN HI CHSV (CAST SUPPLIES) ×2 IMPLANT
PADDING CAST COTTON 4X4 STRL (CAST SUPPLIES) ×1
PADDING CAST COTTON 6X4 STRL (CAST SUPPLIES) ×3 IMPLANT
PENCIL BUTTON HOLSTER BLD 10FT (ELECTRODE) IMPLANT
RUBBERBAND STERILE (MISCELLANEOUS) ×3 IMPLANT
SET ARTHROSCOPY TUBING (MISCELLANEOUS) ×1
SET ARTHROSCOPY TUBING LN (MISCELLANEOUS) ×2 IMPLANT
SET HNDPC FAN SPRY TIP SCT (DISPOSABLE) ×2 IMPLANT
SPONGE GAUZE 4X4 12PLY (GAUZE/BANDAGES/DRESSINGS) ×3 IMPLANT
SPONGE LAP 4X18 X RAY DECT (DISPOSABLE) ×3 IMPLANT
STRIP CLOSURE SKIN 1/2X4 (GAUZE/BANDAGES/DRESSINGS) ×3 IMPLANT
SUCTION FRAZIER TIP 10 FR DISP (SUCTIONS) ×3 IMPLANT
SUT ETHIBOND NAB CT1 #1 30IN (SUTURE) ×3 IMPLANT
SUT ETHILON 4 0 PS 2 18 (SUTURE) IMPLANT
SUT MNCRL AB 3-0 PS2 18 (SUTURE) ×3 IMPLANT
SUT VIC AB 0 CT1 27 (SUTURE) ×2
SUT VIC AB 0 CT1 27XBRD ANBCTR (SUTURE) ×4 IMPLANT
SUT VIC AB 2-0 CT1 27 (SUTURE) ×1
SUT VIC AB 2-0 CT1 TAPERPNT 27 (SUTURE) ×2 IMPLANT
SYR 30ML SLIP (SYRINGE) ×3 IMPLANT
TOWEL OR 17X24 6PK STRL BLUE (TOWEL DISPOSABLE) ×3 IMPLANT
TOWEL OR 17X26 10 PK STRL BLUE (TOWEL DISPOSABLE) ×3 IMPLANT
TRAY FOLEY CATH 16FRSI W/METER (SET/KITS/TRAYS/PACK) ×3 IMPLANT
TUBE CONNECTING 12X1/4 (SUCTIONS) IMPLANT
WAND 90 DEG TURBOVAC W/CORD (SURGICAL WAND) IMPLANT
WATER STERILE IRR 1000ML POUR (IV SOLUTION) ×9 IMPLANT
WRAP KNEE MAXI GEL POST OP (GAUZE/BANDAGES/DRESSINGS) ×3 IMPLANT

## 2012-11-13 NOTE — Preoperative (Signed)
Beta Blockers   Reason not to administer Beta Blockers:Not Applicable 

## 2012-11-13 NOTE — Interval H&P Note (Signed)
History and Physical Interval Note:  11/13/2012 7:03 AM  Donald Gray  has presented today for surgery, with the diagnosis of DJD LEFT KNEE  The various methods of treatment have been discussed with the patient and family. After consideration of risks, benefits and other options for treatment, the patient has consented to  Procedure(s): UNICOMPARTMENTAL KNEE (Left) ARTHROSCOPY KNEE (Left) as a surgical intervention .  The patient's history has been reviewed, patient examined, no change in status, stable for surgery.  I have reviewed the patient's chart and labs.  Questions were answered to the patient's satisfaction.     Salvatore Marvel A

## 2012-11-13 NOTE — Anesthesia Preprocedure Evaluation (Addendum)
Anesthesia Evaluation  Patient identified by MRN, date of birth, ID band Patient awake    Reviewed: Allergy & Precautions, H&P , NPO status , Patient's Chart, lab work & pertinent test results  Airway       Dental   Pulmonary          Cardiovascular hypertension, + CAD and + Past MI + dysrhythmias     Neuro/Psych    GI/Hepatic GERD-  ,  Endo/Other  diabetes, Type obesity  Renal/GU Renal disease     Musculoskeletal   Abdominal   Peds  Hematology   Anesthesia Other Findings   Reproductive/Obstetrics                          Anesthesia Physical Anesthesia Plan  ASA: III  Anesthesia Plan: General   Post-op Pain Management:    Induction: Intravenous  Airway Management Planned: Oral ETT  Additional Equipment:   Intra-op Plan:   Post-operative Plan: Extubation in OR  Informed Consent: I have reviewed the patients History and Physical, chart, labs and discussed the procedure including the risks, benefits and alternatives for the proposed anesthesia with the patient or authorized representative who has indicated his/her understanding and acceptance.     Plan Discussed with: CRNA, Anesthesiologist and Surgeon  Anesthesia Plan Comments:         Anesthesia Quick Evaluation

## 2012-11-13 NOTE — Care Management Note (Addendum)
    Page 1 of 2   11/14/2012     2:26:53 PM   CARE MANAGEMENT NOTE 11/14/2012  Patient:  BLAYDEN, CONWELL   Account Number:  [id number]  Date Initiated:  11/13/2012  Documentation initiated by:  [facility]  Subjective/Objective Assessment:   admitted postop left total knee arthroplasty     Action/Plan:   plan return home with HHPT and HHOT   Anticipated DC Date:  11/14/2012   Anticipated DC Plan:  HOME W HOME HEALTH SERVICES      DC Planning Services  CM consult      Choice offered to / List presented to:     DME arranged  CPM  HOSPITAL BED      DME agency  [employer]     HH arranged  HH-2 PT  HH-3 OT      [facility] agency  [facility]   Status of service:  Completed, signed off Medicare Important Message given?   (If response is "NO", the following Medicare IM given date fields will be blank) Date Medicare IM given:   Date Additional Medicare IM given:    Discharge Disposition:  HOME W HOME HEALTH SERVICES  Per UR Regulation:    If discussed at Long Length of Stay Meetings, dates discussed:    Comments:  11/14/12 OT recommended HHOT. Contacted Debbie with [facility] and set up HHOT. Jacquelynn Cree RN, BSN, CCM   11/13/12 Patient set up with HHPT with Gordy Clement by MD office. Spoke with patient and his wife, no change in d/c plan. Requested hospital bed, checked with Harrold Donath from [employer], not covered by [employer] Patient agreeable to pay out of pocket to rent. No order needed for hospital bed due to patient paying out of pocket.Patient's wife able to assist after discharge. Jacquelynn Cree RN, BSN, CCM

## 2012-11-13 NOTE — Progress Notes (Signed)
Orthopedic Tech Progress Note Patient Details:  Donald Gray 10-22-1940 [id number] CPM applied to Left LE with appropriate settings. OHF applied to bed. CPM Left Knee CPM Left Knee: On Left Knee Flexion (Degrees): 60 Left Knee Extension (Degrees): 0   [name] 11/13/2012, 11:10 AM

## 2012-11-13 NOTE — H&P (View-Only) (Signed)
TOTAL KNEE ADMISSION H&P  Patient is being admitted for left total knee arthroplasty.  Subjective:  Chief Complaint:left knee pain.  HPI: Donald Gray, 72 y.o. male, has a history of pain and functional disability in the left knee due to arthritis and has failed non-surgical conservative treatments for greater than 12 weeks to includeNSAID's and/or analgesics, corticosteriod injections, viscosupplementation injections, flexibility and strengthening excercises, supervised PT with diminished ADL's post treatment, use of assistive devices, weight reduction as appropriate and activity modification.  Onset of symptoms was gradual, starting 10 years ago with gradually worsening course since that time. The patient noted prior procedures on the knee to include  arthroscopy and menisectomy on the left knee(s).  Patient currently rates pain in the left knee(s) at 10 out of 10 with activity. Patient has night pain, worsening of pain with activity and weight bearing, pain that interferes with activities of daily living, crepitus and joint swelling.  Patient has evidence of subchondral sclerosis, periarticular osteophytes and joint space narrowing by imaging studies.  There is no active infection.  Patient Active Problem List   Diagnosis Date Noted  . Left knee DJD   . Renal disorder   . Diabetes   . H/O interleukin-2 treatment   . Diverticulosis   . Arthritis   . GERD (gastroesophageal reflux disease)   . Anemia   . Heart attack   . Hyperlipidemia   . Coronary artery disease   . Cancer   . Hypertension   . Obesity 01/23/2012  . Dyspnea 12/19/2011  . Bilateral inguinal hernia 08/17/2011   Past Medical History  Diagnosis Date  . Hypertension   . Cancer     pt with left kidney ca with resultant nephrectomy and mets to the lungs.  . Coronary artery disease   . Hyperlipidemia   . Heart attack     1995   . Dysrhythmia     atrial fib hx of   . Shortness of breath     related to sutent  and anti PD-1( anticancer drugs )   . Anemia     at time of cancer   . Blood transfusion     last one 2011   . GERD (gastroesophageal reflux disease)   . Arthritis     knees   . Diverticulosis   . H/O interleukin-2 treatment     2012 - for tx of metastatic renal cell carcinoma   . Diabetes     hx of pt not on meds at this time   . Hearing loss     hearing aids   . Renal disorder     hx of kidney cancer , CKD III  . Left knee DJD     Past Surgical History  Procedure Laterality Date  . Nephrectomy    . Cardiac surgery    . Knee surgery    . Pilonidal cyst drainage    . Shoulder surgery  1997    right shoulder  . Kidney surgery  12/14/2009    removed right kidney  . Joint replacement      right shoulder replacement   . Tonsillectomy      and adenoidectomy   . Cardiac catheterization      05/2009   . Coronary angioplasty with stent placement      1997 at [facility] to RCA   . Inguinal hernia repair  09/22/2011    Procedure: HERNIA REPAIR INGUINAL ADULT BILATERAL;  Surgeon: Wilmon Arms. Corliss Skains, MD;  Location: [facility]  ORS;  Service: General;  Laterality: Bilateral;  . Orchiectomy  09/22/2011    Procedure: ORCHIECTOMY;  Surgeon: Marcine Matar, MD;  Location: [facility] ORS;  Service: Urology;  Laterality: Left;  Left Testicular Exploration, Inguinal Approach,  Testicular Orchiectomy,   . Hernia repair  09/22/11    BIH     (Not in a hospital admission) Allergies  Allergen Reactions  . Penicillins   . Ambien (Zolpidem Tartrate)   . Other     No steroids while on "IL2" therapy    Current outpatient prescriptions:aspirin 325 MG tablet, Take 325 mg by mouth daily with breakfast. , Disp: , Rfl: ;  atorvastatin (LIPITOR) 10 MG tablet, Take 10 mg by mouth daily with breakfast. , Disp: , Rfl: ;  azelastine (ASTELIN) 137 MCG/SPRAY nasal spray, Place 1 spray into the nose 2 (two) times daily as needed. For congestion; Use in each nostril as directed, Disp: , Rfl:  cetirizine (ZYRTEC) 10 MG tablet,  Take 10 mg by mouth daily. Pt takes zyrtec for 30 days then allegra for 30 days , pt alternates, Disp: , Rfl: ;  Coenzyme Q10 (CO Q 10) 100 MG CAPS, Take 1 tablet by mouth daily. , Disp: , Rfl: ;  fluticasone (FLONASE) 50 MCG/ACT nasal spray, Place 2 sprays into the nose daily. Do not use while on "IL2", Disp: , Rfl:  lisinopril (PRINIVIL,ZESTRIL) 20 MG tablet, Take 20 mg by mouth 2 (two) times daily. For raised BP while on "IL2", if b/p low per pt only takes one per day per MD, Disp: , Rfl: ;  magnesium gluconate (MAGONATE) 500 MG tablet, Take 500 mg by mouth 2 (two) times daily. , Disp: , Rfl: ;  Omega-3 Fatty Acids (FISH OIL) 1200 MG CAPS, Take 1 capsule by mouth daily. , Disp: , Rfl:  omeprazole (PRILOSEC) 20 MG capsule, Take 20 mg by mouth daily., Disp: , Rfl: ;  pregabalin (LYRICA) 75 MG capsule, Take 75 mg by mouth 2 (two) times daily., Disp: , Rfl: ;  vitamin C (ASCORBIC ACID) 500 MG tablet, Take 500 mg by mouth daily. , Disp: , Rfl: ;  fexofenadine (ALLEGRA) 180 MG tablet, Take 180 mg by mouth daily with breakfast. Pt takes allegra for 30 days then zyrtec for 30 days, Disp: , Rfl:  glucosamine-chondroitin 500-400 MG tablet, Take 2 tablets by mouth daily. , Disp: , Rfl:    History  Substance Use Topics  . Smoking status: Former Smoker -- 1.00 packs/day for 2 years    Types: Cigarettes    Quit date: 05/03/1961  . Smokeless tobacco: Never Used  . Alcohol Use: Yes     Comment: occasional glass of wine     Family History  Problem Relation Age of Onset  . Heart disease Father      Review of Systems  Constitutional: Negative.   HENT: Negative.   Eyes: Negative.   Respiratory: Positive for shortness of breath.   Cardiovascular: Negative.   Gastrointestinal: Negative.   Genitourinary: Negative.   Musculoskeletal: Positive for joint pain.  Skin: Negative.   Neurological: Negative.   Endo/Heme/Allergies: Negative.   Psychiatric/Behavioral: Negative.     Objective:  Physical Exam   Constitutional: He is oriented to person, place, and time. He appears well-developed and well-nourished.  HENT:  Head: Normocephalic and atraumatic.  Eyes: Conjunctivae are normal. Pupils are equal, round, and reactive to light.  Neck: Normal range of motion. Neck supple.  Cardiovascular: Normal rate, regular rhythm and normal heart sounds.  Respiratory: Effort normal and breath sounds normal.  GI: Soft. Bowel sounds are normal.  Genitourinary:  Not pertinent to current symptomatology therefore not examined.  Musculoskeletal:  Examination of his left knee reveals pain on the medial joint line positive medial McMurray's mild varus deformity range of motion from 0-120 degrees knee is stable with normal patella tracking. Exam of his right knee reveals pain medially with moderate varus deformity 1 to 2+ crepitation 1+ synovitis range of motion 0-120 degrees knee is stable with normal patella tracking.  Vascular exam: pulses 2+ and symmetric.  Neurological: He is alert and oriented to person, place, and time.  Skin: Skin is warm and dry.  Psychiatric: He has a normal mood and affect. His behavior is normal.    Vital signs in last 24 hours: Last recorded: 07/01 1100   BP: 133/84 Pulse: 87  Temp: 98.2 F (36.8 C)    Height: 5\' 11"  (1.803 m) SpO2: 95  Weight: 99.338 kg (219 lb)     Labs:   Estimated body mass index is 30.56 kg/(m^2) as calculated from the following:   Height as of this encounter: 5\' 11"  (1.803 m).   Weight as of this encounter: 99.338 kg (219 lb).   Imaging Review Plain radiographs demonstrate severe degenerative joint disease of the left knee(s). The overall alignment ismild varus. The bone quality appears to be good for age and reported activity level.  Assessment/Plan:  End stage arthritis, left knee   The patient history, physical examination, clinical judgment of the provider and imaging studies are consistent with end stage degenerative joint disease of  the left knee(s) and total knee arthroplasty is deemed medically necessary. The treatment options including medical management, injection therapy arthroscopy and arthroplasty were discussed at length. The risks and benefits of total knee arthroplasty were presented and reviewed. The risks due to aseptic loosening, infection, stiffness, patella tracking problems, thromboembolic complications and other imponderables were discussed. The patient acknowledged the explanation, agreed to proceed with the plan and consent was signed. Patient is being admitted for inpatient treatment for surgery, pain control, PT, OT, prophylactic antibiotics, VTE prophylaxis, progressive ambulation and ADL's and discharge planning. The patient is planning to be discharged home with home health services  Ineze Serrao A. Gwinda Passe Physician Assistant [facility] [phone]  10/31/2012, 12:34 PM

## 2012-11-13 NOTE — Progress Notes (Signed)
UR COMPLETED  

## 2012-11-13 NOTE — Anesthesia Procedure Notes (Signed)
Anesthesia Regional Block:  Femoral nerve block  Pre-Anesthetic Checklist: ,, timeout performed, Correct Patient, Correct Site, Correct Laterality, Correct Procedure, Correct Position, site marked, Risks and benefits discussed,  Surgical consent,  Pre-op evaluation,  At surgeon's request and post-op pain management  Laterality: Left  Prep: Maximum Sterile Barrier Precautions used, chloraprep and alcohol swabs       Needles:  Injection technique: Single-shot  Needle Type: Stimulator Needle - 80        Needle insertion depth: 7 cm   Additional Needles:  Procedures: nerve stimulator Femoral nerve block  Nerve Stimulator or Paresthesia:  Response: 0.5 mA, 0.1 ms, 7 cm  Additional Responses:   Narrative:  Start time: 11/13/2012 7:00 AM End time: 11/13/2012 7:08 AM Injection made incrementally with aspirations every 5 mL.  Performed by: Personally  Anesthesiologist: Maren Beach MD  Additional Notes: Pt accepts procedure and risks. 25cc 0.5% Marcaine w/ epi w/o difficulty or discomfort. Pt tolerated well. GES

## 2012-11-13 NOTE — Transfer of Care (Signed)
Immediate Anesthesia Transfer of Care Note  Patient: Donald Gray  Procedure(s) Performed: Procedure(s): ARTHROSCOPY KNEE (Left) TOTAL KNEE ARTHROPLASTY (Left)  Patient Location: PACU  Anesthesia Type:General and Regional  Level of Consciousness: awake, oriented, patient cooperative and responds to stimulation  Airway & Oxygen Therapy: Patient Spontanous Breathing and Patient connected to nasal cannula oxygen  Post-op Assessment: Report given to PACU RN and Post -op Vital signs reviewed and stable  Post vital signs: Reviewed and stable  Complications: No apparent anesthesia complications

## 2012-11-13 NOTE — Anesthesia Postprocedure Evaluation (Signed)
  Anesthesia Post-op Note  Patient: Donald Gray  Procedure(s) Performed: Procedure(s): ARTHROSCOPY KNEE (Left) TOTAL KNEE ARTHROPLASTY (Left)  Patient Location: PACU  Anesthesia Type:GA combined with regional for post-op pain  Level of Consciousness: awake, alert , oriented and patient cooperative  Airway and Oxygen Therapy: Patient Spontanous Breathing  Post-op Pain: mild  Post-op Assessment: Post-op Vital signs reviewed, Patient's Cardiovascular Status Stable, Respiratory Function Stable, Patent Airway, No signs of Nausea or vomiting and Pain level controlled  Post-op Vital Signs: stable  Complications: No apparent anesthesia complications

## 2012-11-13 NOTE — Op Note (Signed)
NAMEASEEL, UHDE NO.:  [id number]  MEDICAL RECORD NO.:  [id number]  LOCATION:  [id number]                        FACILITY:  [facility]  PHYSICIAN:  Elana Alm. Thurston Hole, M.D. DATE OF BIRTH:  11-19-1940  DATE OF PROCEDURE:  11/13/2012 DATE OF DISCHARGE:                              OPERATIVE REPORT   PREOPERATIVE DIAGNOSIS:  Left knee degenerative joint disease.  POSTOPERATIVE DIAGNOSIS:  Left knee degenerative joint disease.  PROCEDURES: 1. Left knee diagnostic arthroscopy. 2. Left total knee replacement using DePuy cemented total knee system     with #4 cemented femur, #4 cemented tibia with 12.5-mm polyethylene     RP tibial spacer and 35-mm polyethylene cemented patella.  SURGEON:  Elana Alm. Thurston Hole, M.D.  ASSISTANT:  Eulas Post, MD and Julien Girt, PA-C  ANESTHESIA:  General.  OPERATIVE TIME:  1 hour and 40 minutes.  COMPLICATIONS:  None.  DESCRIPTION OF PROCEDURE:  Mr. Rodenberg was brought to the operating room on November 13, 2012, after a femoral nerve block was placed on the holding room by anesthesia.  He was placed on the operating table in supine position.  He received antibiotics preoperatively for prophylaxis.  After being placed under general anesthesia, he had a Foley catheter placed under sterile conditions.  His left knee was examined.  Range of motion from -5 to 125 degrees, moderate varus deformity, knee stable, ligamentous exam with normal patellar tracking. Left leg was prepped using sterile DuraPrep and draped using sterile technique.  Time-out procedure was called and the correct left knee identified.  Initially, a diagnostic arthroscopy was performed in order to determine whether he was a candidate for a unicompartmental arthroplasty versus a total knee replacement.  Through an anterolateral portal, the arthroscope with a pump attached was placed.  On initial inspection of medial compartment, he was found to have 80-90% grade  3 and 10% grade 4 DJD.  Patellofemoral joint showed 50-75% grade 3 and 10% grade 4 chondromalacia.  Lateral compartment showed minimal damage. Because two of the three compartment showed significant DJD, he was not felt to be a candidate for unicompartment arthroplasty.  At this point, the arthroscope was removed.  The leg was then exsanguinated and a thigh tourniquet was elevated to 365 mm.  Initially, through a 10-12 cm longitudinal incision based over the patella, initial exposure was made. The underlying subcutaneous tissues were incised along with the skin incision.  A median arthrotomy was performed.  On initial inspection, the chondral DJD changes were noted.  The medial and lateral meniscal remnants were removed as well as the anterior cruciate ligament. Intramedullary drill was then drilled up the femoral canal for placement of distal femoral cutting jig, it was placed in the appropriate manner rotation and distal 11-mm cut was made.  The distal femur was incised. The #4 was found to be the appropriate size.  #4 cutting jig was placed in the appropriate anterior, posterior and chamfer cuts were made. Proximal tibia was then exposed.  The tibial spines were removed with an oscillating saw.  Extramedullary tibial alignment guide was then placed and 6 mm was resected off the medial or lower side using this alignment and tibial jig.  After this  was done, spacer blocks were placed in flexion and extension.  The 12.5-mm blocks gave the excellent stability and excellent correction of his flexion and varus deformities.  At this point, the #4 tibial baseplate tray was placed on the cut tibial surface with an excellent fit and the keel cut was made.  The PCL box cutter was placed on the distal femur and these cuts were made.  At this point with the #4 femoral trial placed and the #4 tibial base plate trial, 14.7-WG polyethylene RP tibial spacer was then placed and knee reduced  taken through range of motion from 0-125 degrees with excellent stability and excellent correction of his flexion and varus deformities and normal patellar tracking.  At this point, a resurfacing 9-mm cut was made on the patella and three locking holes placed for a 35-mm polyethylene patellar trial and again, patellofemoral tracking was evaluated and found to be normal.  At this point, it was felt that all trial components were of excellent size, fit, and stability.  The trial components were then removed.  The knee was then jet lavaged, irrigated with saline.  The proximal tibia was then exposed.  The #4 tibial baseplate with cement backing was hammered into position with an excellent fit with excess cement being removed from around the edges. The #4 femoral component with cement backing was hammered in position also with an excellent fit with excess cement being removed from around the edges.  The 12.5-mm polyethylene RP tibial spacer was then placed on the tibial baseplate, the knee reduced, taken through a range of motion. He had full range of motion with excellent stability and excellent correction of his flexion and varus deformities.  The 35-mm polyethylene cement backed patella was then placed in its position and held there with a clamp.  After the cement hardened, again patellofemoral tracking was evaluated and found to be normal.  At this point, it was felt that all the components were of excellent size, fit, and stability.  The wound was further irrigated with saline and then the arthrotomy was closed with 0 Vicryl in the subvastus region of the exposure with a running 0 Vicryl suture and the rest of the arthrotomy was closed with running #1 Ethibond suture.  This was done over two medium Hemovac drains.  Knee was brought through a full range of motion and again found to be stable leg lengths equal.  At this point, subcutaneous tissues were closed with 0 and 2-0 Vicryl,  subcuticular layer closed with 4-0 Monocryl.  Sterile dressings were applied and a long-leg splint. Tourniquet was then released.  The patient was awakened, extubated, and taken to the recovery room in stable condition.  Needle and sponge counts were correct x2 at the end of the case.     Gwendalyn Mcgonagle A. Thurston Hole, M.D.     RAW/MEDQ  D:  11/13/2012  T:  11/13/2012  Job:  [id number]

## 2012-11-13 NOTE — Evaluation (Signed)
Physical Therapy Evaluation Patient Details Name: Donald Gray MRN: [id number] DOB: [birth year] Today's Date: 11/13/2012 Time: 5621-3086 PT Time Calculation (min): 29 min  PT Assessment / Plan / Recommendation History of Present Illness  Patient is a 72 yo male s/p Lt. TKA.  Patient with h/o HOH, CAD, MI, DM.  Clinical Impression  Patient presents with problems listed below.  Will benefit from acute PT to maximize independence prior to return home with wife.    PT Assessment  Patient needs continued PT services    Follow Up Recommendations  Home health PT;Supervision/Assistance - 24 hour    Does the patient have the potential to tolerate intense rehabilitation      Barriers to Discharge        Equipment Recommendations  None recommended by PT    Recommendations for Other Services     Frequency 7X/week    Precautions / Restrictions Precautions Precautions: Knee Precaution Booklet Issued: Yes (comment) Precaution Comments: Reviewed precautions with patient and wife who is a Engineer, civil (consulting). Required Braces or Orthoses: Knee Immobilizer - Left Knee Immobilizer - Left: On when out of bed or walking;Discontinue once straight leg raise with < 10 degree lag Restrictions Weight Bearing Restrictions: Yes RLE Weight Bearing: Weight bearing as tolerated   Pertinent Vitals/Pain       Mobility  Bed Mobility Bed Mobility: Supine to Sit;Sitting - Scoot to Edge of Bed Supine to Sit: 4: Min assist;With rails;HOB flat Sitting - Scoot to Edge of Bed: 4: Min guard Details for Bed Mobility Assistance: Instructed patient and wife on donning KI on LLE.  Verbal cues for technique.  Assist to move LLE off of bed and to raise trunk to sitting.  Patient with good sitting balance. Transfers Transfers: Sit to Stand;Stand to Sit;Stand Pivot Transfers Sit to Stand: 4: Min assist;From elevated surface;With upper extremity assist;From bed Stand to Sit: 4: Min assist;With upper extremity assist;With  armrests;To chair/3-in-1 Stand Pivot Transfers: 4: Min assist Details for Transfer Assistance: Verbal cues for hand placement.  Assist to rise to standing.  Patient able to take several steps to pivot to chair.  Assist to control descent into chair. Ambulation/Gait Ambulation/Gait Assistance: Not tested (comment)    Exercises Total Joint Exercises Ankle Circles/Pumps: AROM;Both;10 reps;Seated   PT Diagnosis: Difficulty walking;Acute pain  PT Problem List: Decreased strength;Decreased range of motion;Decreased activity tolerance;Decreased balance;Decreased mobility;Decreased knowledge of use of DME;Decreased knowledge of precautions;Pain PT Treatment Interventions: DME instruction;Gait training;Stair training;Functional mobility training;Therapeutic exercise;Patient/family education     PT Goals(Current goals can be found in the care plan section) Acute Rehab PT Goals Patient Stated Goal: To be able to go home. PT Goal Formulation: With patient/family Time For Goal Achievement: 11/20/12 Potential to Achieve Goals: Good  Visit Information  Last PT Received On: 11/13/12 Assistance Needed: +1 History of Present Illness: Patient is a 72 yo male s/p Lt. TKA.  Patient with h/o HOH, CAD, MI, DM.       Prior Functioning  Home Living Family/patient expects to be discharged to:: Private residence Living Arrangements: Spouse/significant other Available Help at Discharge: Family;Available 24 hours/day Type of Home: House Home Access: Stairs to enter [employer] of Steps: 2 Entrance Stairs-Rails: None Home Layout: Two level;Able to live on main level with bedroom/bathroom Home Equipment: Dan Humphreys - 2 wheels;Hospital bed;Bedside commode;Shower seat;Cane - single point Prior Function Level of Independence: Independent with assistive device(s) (Used cane pta.) Communication Communication: HOH (hearing aids)    Cognition  Cognition Arousal/Alertness: Awake/alert Behavior During  Therapy: WFL for tasks assessed/performed Overall Cognitive Status: Within Functional Limits for tasks assessed    Extremity/Trunk Assessment Upper Extremity Assessment Upper Extremity Assessment: Overall WFL for tasks assessed Lower Extremity Assessment Lower Extremity Assessment: LLE deficits/detail LLE Deficits / Details: Able to assist with moving LLE off of bed. LLE: Unable to fully assess due to pain   Balance    End of Session PT - End of Session Equipment Utilized During Treatment: Gait belt;Left knee immobilizer Activity Tolerance: Patient tolerated treatment well Patient left: in chair;with call bell/phone within reach;with family/visitor present Nurse Communication: Mobility status CPM Left Knee CPM Left Knee: Off (off at 15:20)  GP     Vena Austria 11/13/2012, 4:30 PM Durenda Hurt. Renaldo Fiddler, [facility] Acute Rehab Services Pager [phone]

## 2012-11-13 NOTE — Brief Op Note (Signed)
11/13/2012  9:23 AM  PATIENT:  Loanne Drilling  72 y.o. male  PRE-OPERATIVE DIAGNOSIS:  DJD LEFT KNEE  POST-OPERATIVE DIAGNOSIS:  DJD left knee  PROCEDURE:  Procedure(s): ARTHROSCOPY KNEE (Left) TOTAL KNEE ARTHROPLASTY (Left)  SURGEON:  Surgeon(s) and Role:    * Nilda Simmer, MD - Primary    * Eulas Post, MD - Assisting  PHYSICIAN ASSISTANT: Julien Girt PA-C  ASSISTANTS: Teryl Lucy MD, Kirstin Shepperson PA-C  ANESTHESIA:   general  EBL:  Total I/O In: 2000 [I.V.:2000] Out: 100 [Urine:100]  BLOOD ADMINISTERED:none  DRAINS: (2) Hemovact drain(s) in the left knee with  Suction Clamped   LOCAL MEDICATIONS USED:  NONE  SPECIMEN:  No Specimen  DISPOSITION OF SPECIMEN:  N/A  COUNTS:  YES  TOURNIQUET:  * Missing tourniquet times found for documented tourniquets in log:  [id number] *  DICTATION: .Note written in EPIC  PLAN OF CARE: Admit to inpatient   PATIENT DISPOSITION:  PACU - hemodynamically stable.   Delay start of Pharmacological VTE agent (>24hrs) due to surgical blood loss or risk of bleeding: no

## 2012-11-13 NOTE — Progress Notes (Signed)
Patient ID: Donald Gray, male   DOB: 08/20/1940, 72 y.o.   MRN: [id number] 11/13/2012 I spoke to his nurse.  [facility] Diabetes.  Sliding scale orderd.  [name] Physician Assistant [facility] [phone]  11/13/2012, 2:45 PM

## 2012-11-13 NOTE — Progress Notes (Signed)
Pt arrived to room 5N14 around 1030.  Pt is alert and oriented.  Pt has CMP to left leg and ace-warp with hemovac in place.  Oriented pt and wife to room and unit.  Call bell is within reach.  Will continue to monitor.

## 2012-11-14 ENCOUNTER — Encounter (HOSPITAL_COMMUNITY): Payer: Self-pay | Admitting: Orthopedic Surgery

## 2012-11-14 LAB — CBC
MCHC: 34 g/dL (ref 30.0–36.0)
Platelets: 158 10*3/uL (ref 150–400)
RDW: 13.9 % (ref 11.5–15.5)
WBC: 9 10*3/uL (ref 4.0–10.5)

## 2012-11-14 LAB — BASIC METABOLIC PANEL
BUN: 26 mg/dL — ABNORMAL HIGH (ref 6–23)
GFR calc Af Amer: 67 mL/min — ABNORMAL LOW (ref >90)
GFR calc non Af Amer: 57 mL/min — ABNORMAL LOW (ref >90)
Potassium: 5.2 mEq/L — ABNORMAL HIGH (ref 3.5–5.1)
Sodium: 135 mEq/L (ref 135–145)

## 2012-11-14 LAB — GLUCOSE, CAPILLARY
Glucose-Capillary: 170 mg/dL — ABNORMAL HIGH (ref 70–99)
Glucose-Capillary: 160 mg/dL — ABNORMAL HIGH (ref 70–99)

## 2012-11-14 MED ORDER — OXYCODONE HCL 5 MG PO TABS: ORAL_TABLET | ORAL | Status: DC

## 2012-11-14 MED ORDER — ACETAMINOPHEN 325 MG PO TABS: 650.0000 mg | ORAL_TABLET | Freq: Four times a day (QID) | ORAL | Status: DC | PRN

## 2012-11-14 MED ORDER — BISACODYL 5 MG PO TBEC: DELAYED_RELEASE_TABLET | ORAL | Status: DC

## 2012-11-14 MED ORDER — ASPIRIN 325 MG PO TABS: ORAL_TABLET | ORAL | Status: DC

## 2012-11-14 NOTE — Progress Notes (Addendum)
Inpatient Diabetes Program Recommendations  AACE/ADA: New Consensus Statement on Inpatient Glycemic Control (2013)  Target Ranges:  Prepandial:   less than 140 mg/dL      Peak postprandial:   less than 180 mg/dL (1-2 hours)      Critically ill patients:  140 - 180 mg/dL   Reason for Visit: Referral received.  Note A1C=6.4% and patient has diet controlled Diabetes.  CBG's were elevated during hospitalization.  Likely due to Decadron that patient received post-op.  Based on A1C, CBG's should improve.  Patient should follow-up with PCP.      Spoke with patient and wife.  He has been on humalog at home prior due to steroids.  He wants to make sure it is available if needed.  Called and left message with Alona Bene at Dr. Jone Baseman office regarding request.  Strips and lancets have been called into [facility].  He states that the scale that he used in the past for the Humalog was 141-190 mg/dL=1 unit, 409-811 mg/dL=2 units, 914-782 mg/dL=3 units, 956-213 mg/dL=4 units.  Asked Dr. Jone Baseman nurse to call patient regarding request.  Discussed with patient and wife.

## 2012-11-14 NOTE — Progress Notes (Addendum)
Physical Therapy Treatment Patient Details Name: Donald Gray MRN: [id number] DOB: 10/11/1940 Today's Date: 11/14/2012 Time: 7829-5621 PT Time Calculation (min): 53 min  PT Assessment / Plan / Recommendation  PT Comments   Pt able to ambulate this session and perform stairs.  Pt continues to have left knee buckling therefore highly recommend to continue with KI until cleared by MD for safe ambulation.  Spent several minutes educating in great detail on proper technique with transfers, step negotiation and (A) OT with shower transfer.  Pt with great detail questions and answered thoroughly.  However HHPT will need to address HEP at this time if pt leaves in AM.  Will review steps with wife if pt not d/c AM.  Pt wanted to speak with CM and PT notified CM.   Follow Up Recommendations  Home health PT;Supervision/Assistance - 24 hour     Does the patient have the potential to tolerate intense rehabilitation     Barriers to Discharge        Equipment Recommendations  None recommended by PT    Recommendations for Other Services    Frequency 7X/week   Progress towards PT Goals Progress towards PT goals: Progressing toward goals  Plan Current plan remains appropriate    Precautions / Restrictions Precautions Precautions: Knee Precaution Booklet Issued: No Precaution Comments: Reviewed precautions with pt. Required Braces or Orthoses: Knee Immobilizer - Left Knee Immobilizer - Left: On when out of bed or walking;Discontinue once straight leg raise with < 10 degree lag Restrictions Weight Bearing Restrictions: Yes RLE Weight Bearing: Weight bearing as tolerated   Pertinent Vitals/Pain Does not rate but c/o chronic nerve pain in left knee   Mobility  Bed Mobility Bed Mobility: Not assessed Transfers Transfers: Sit to Stand;Stand to Sit;Stand Pivot Transfers Sit to Stand: 4: Min guard;With upper extremity assist;From chair/3-in-1 Stand to Sit: 4: Min guard;With upper extremity  assist;To chair/3-in-1 Details for Transfer Assistance: Minguard for safety. Cues for technique. Ambulation/Gait Ambulation/Gait Assistance: 4: Min guard Ambulation Distance (Feet): 80 Feet Assistive device: Rolling walker Ambulation/Gait Assistance Details: Minguard for safety with cues for step squence and continues to have mild left knee buckling with weightbearing on left side Gait Pattern: Step-to pattern;Decreased stance time - left;Antalgic;Trunk flexed Gait velocity: decreased Stairs: Yes Stairs Assistance: 4: Min assist;3: Mod assist Stairs Assistance Details (indicate cue type and reason): (A) to maintain balance due to occasional left sided knee buckling Stair Management Technique: Backwards;With walker Number of Stairs: 2    Exercises     PT Diagnosis:    PT Problem List:   PT Treatment Interventions:     PT Goals (current goals can now be found in the care plan section) Acute Rehab PT Goals Patient Stated Goal: To be able to go home. PT Goal Formulation: With patient/family Time For Goal Achievement: 11/20/12 Potential to Achieve Goals: Good  Visit Information  Last PT Received On: 11/14/12 Assistance Needed: +1 PT/OT Co-Evaluation/Treatment: Yes History of Present Illness: Patient is a 72 yo male s/p Lt. TKA.  Patient with h/o HOH, CAD, MI, DM.    Subjective Data  Subjective: "I think I can get in the shower this way." Patient Stated Goal: To be able to go home.   Cognition  Cognition Arousal/Alertness: Awake/alert Behavior During Therapy: WFL for tasks assessed/performed Overall Cognitive Status: Impaired/Different from baseline Area of Impairment: Safety/judgement Safety/Judgement: Decreased awareness of safety    Balance     End of Session PT - End of Session Equipment Utilized  During Treatment: Gait belt;Left knee immobilizer Activity Tolerance: Patient tolerated treatment well Patient left: in chair;with call bell/phone within reach;with  family/visitor present Nurse Communication: Mobility status CPM Left Knee CPM Left Knee: Off   GP     Donald Gray 11/14/2012, 11:27 AM  Jake Shark, PT DPT [phone]

## 2012-11-14 NOTE — Evaluation (Addendum)
Occupational Therapy Evaluation Patient Details Name: Donald Gray MRN: [id number] DOB: [birth year] Today's Date: 11/14/2012 Time: 4098-1191 OT Time Calculation (min): 52 min  OT Assessment / Plan / Recommendation History of present illness Patient is a 72 yo male s/p Lt. TKA.  Patient with h/o HOH, CAD, MI, DM.   Clinical Impression   Pt presents with below problem list. OT provided education and practiced ADLs during session. OT signing off due to discharge already in. Pt will have 24/7 assist from wife at home. Recommending HHOT upon d/c.     OT Assessment  All further OT needs can be met in the next venue of care    Follow Up Recommendations  Home health OT;Supervision/Assistance - 24 hour    Barriers to Discharge      Equipment Recommendations  3 in 1 bedside comode    Recommendations for Other Services    Frequency       Precautions / Restrictions Precautions Precautions: Knee Precaution Booklet Issued: No Precaution Comments: Reviewed precautions with pt. Required Braces or Orthoses: Knee Immobilizer - Left Knee Immobilizer - Left: On when out of bed or walking;Discontinue once straight leg raise with < 10 degree lag Restrictions Weight Bearing Restrictions: Yes RLE Weight Bearing: Weight bearing as tolerated   Pertinent Vitals/Pain Pain in knee, but not rated. Repositioned. Increased activity.     ADL  Eating/Feeding: Independent Where Assessed - Eating/Feeding: Chair Grooming: Set up Where Assessed - Grooming: Supported sitting Upper Body Bathing: Set up Where Assessed - Upper Body Bathing: Supported sitting Lower Body Bathing: Minimal assistance Where Assessed - Lower Body Bathing: Supported sit to stand Upper Body Dressing: Set up Where Assessed - Upper Body Dressing: Supported sitting Lower Body Dressing: Minimal assistance Where Assessed - Lower Body Dressing: Supported sit to Pharmacist, hospital: Hydrographic surveyor Method: Sit to  Barista: Raised toilet seat with arms (or 3-in-1 over toilet) Tub/Shower Transfer: Simulated;Moderate assistance (very unsafe) Tub/Shower Transfer Method: Science writer: Walk in shower;Other (comment) (3 in 1) Equipment Used: Gait belt;Rolling walker;Sock aid;Reacher;Long-handled sponge;Long-handled shoe horn Transfers/Ambulation Related to ADLs: Minguard for ambulation. Minguard for transfers.  ADL Comments: OT educated on AE to assist with LB dressing as pt is unable to reach left foot. Pt practiced with sockaid and reacher. OT educated to have wife with him when pulling up pants/underwear and walker in front as well as a chair or bed behind him. Also, educated to have walker in front and wife with him when performing hygiene after toileting. Pt attempted to practice shower transfer, but leaving his walker and stepping over threshold to 3 in 1 (NOT SAFE) even with OT telling him not to do so. OT explained NOT to do this at home and to avoid getting in shower at all due to the walker not fitting in sideways or any way according to patient. Explained HHOT will address this, but until then to sponge bathe.     OT Diagnosis: Acute pain  OT Problem List: Decreased strength;Decreased range of motion;Decreased activity tolerance;Impaired balance (sitting and/or standing);Decreased safety awareness;Decreased knowledge of use of DME or AE;Decreased knowledge of precautions;Pain OT Treatment Interventions:     OT Goals(Current goals can be found in the care plan section) Acute Rehab OT Goals Patient Stated Goal: To be able to go home.  Visit Information  Last OT Received On: 11/14/12 Assistance Needed: +1 PT/OT Co-Evaluation/Treatment: Yes History of Present Illness: Patient is a 72 yo male s/p Lt.  TKA.  Patient with h/o HOH, CAD, MI, DM.       Prior Functioning     Home Living Family/patient expects to be discharged to:: Private  residence Living Arrangements: Spouse/significant other Available Help at Discharge: Family;Available 24 hours/day Type of Home: House Home Access: Stairs to enter [employer] of Steps: 2 Entrance Stairs-Rails: None Home Layout: Two level;Able to live on main level with bedroom/bathroom Home Equipment: Dan Humphreys - 2 wheels;Hospital bed;Bedside commode;Shower seat;Cane - single point Prior Function Level of Independence: Independent with assistive device(s) (used cane pta) Communication Communication: HOH (hearing aids)         Vision/Perception     Cognition  Cognition Arousal/Alertness: Awake/alert Behavior During Therapy: WFL for tasks assessed/performed Overall Cognitive Status: Impaired/Different from baseline Area of Impairment: Safety/judgement Safety/Judgement: Decreased awareness of safety    Extremity/Trunk Assessment Upper Extremity Assessment Upper Extremity Assessment: Overall WFL for tasks assessed     Mobility Bed Mobility Bed Mobility: Not assessed Transfers Transfers: Sit to Stand;Stand to Sit Sit to Stand: 4: Min guard;With upper extremity assist;From chair/3-in-1 Stand to Sit: 4: Min guard;With upper extremity assist;To chair/3-in-1 Details for Transfer Assistance: Minguard for safety. Cues for technique.     Exercise     Balance     End of Session OT - End of Session Equipment Utilized During Treatment: Gait belt;Rolling walker Activity Tolerance: Patient tolerated treatment well Patient left: in chair;with call bell/phone within reach CPM Left Knee CPM Left Knee: Off  GO     Earlie Raveling OTR/L [phone] 11/14/2012, 10:44 AM

## 2012-11-14 NOTE — Discharge Summary (Signed)
Patient ID: Donald Gray MRN: [id number] DOB/AGE: 1940-09-27 72 y.o.  Admit date: 11/13/2012 Discharge date: 11/14/2012  Admission Diagnoses:  Principal Problem:   Left knee DJD Active Problems:   Bilateral inguinal hernia   Dyspnea   Obesity   Renal disorder   Diabetes   H/O interleukin-2 treatment   Diverticulosis   Arthritis   GERD (gastroesophageal reflux disease)   Hyperlipidemia   Coronary artery disease   Cancer   Hypertension   Discharge Diagnoses:  Same  Past Medical History  Diagnosis Date  . Hypertension   . Cancer     pt with left kidney ca with resultant nephrectomy and mets to the lungs.  . Coronary artery disease   . Hyperlipidemia   . Heart attack     1995   . Shortness of breath     related to sutent and anti PD-1( anticancer drugs )   . Anemia     at time of cancer   . Blood transfusion     last one 2011   . GERD (gastroesophageal reflux disease)   . Arthritis     knees   . Diverticulosis   . H/O interleukin-2 treatment     2012 - for tx of metastatic renal cell carcinoma   . Diabetes     hx of pt not on meds at this time   . Hearing loss     hearing aids   . Renal disorder     hx of kidney cancer , CKD III  . Left knee DJD   . Dysrhythmia     atrial fib hx of n   hank smith    Surgeries: Procedure(s): ARTHROSCOPY KNEE TOTAL KNEE ARTHROPLASTY on 11/13/2012   Consultants:    Discharged Condition: Improved  Hospital Course: Donald Gray is an 72 y.o. male who was admitted 11/13/2012 for operative treatment ofLeft knee DJD. Patient has severe unremitting pain that affects sleep, daily activities, and work/hobbies. After pre-op clearance the patient was taken to the operating room on 11/13/2012 and underwent  Procedure(s): ARTHROSCOPY KNEE TOTAL KNEE ARTHROPLASTY.    Patient was given perioperative antibiotics: Anti-infectives   Start     Dose/Rate Route Frequency Ordered Stop   11/13/12 1930  vancomycin (VANCOCIN)  IVPB 1000 mg/200 mL premix     1,000 mg 200 mL/hr over 60 Minutes Intravenous Every 12 hours 11/13/12 1103 11/13/12 2132   11/13/12 0600  vancomycin (VANCOCIN) 1,500 mg in sodium chloride 0.9 % 500 mL IVPB     1,500 mg 250 mL/hr over 120 Minutes Intravenous On call to O.R. 11/12/12 1433 11/13/12 0728       Patient was given sequential compression devices, early ambulation, and chemoprophylaxis to prevent DVT.  Patient benefited maximally from hospital stay and there were no complications.    Recent vital signs: Patient Vitals for the past 24 hrs:  BP Temp Pulse Resp SpO2  11/14/12 0509 115/56 mmHg 98.5 F (36.9 C) 84 18 99 %  11/14/12 0400 - - - 17 -  11/14/12 0149 102/53 mmHg 98.3 F (36.8 C) 88 16 97 %  11/14/12 0012 108/60 mmHg - - - -  11/14/12 0000 - - - 17 -  11/13/12 2109 100/53 mmHg 98.6 F (37 C) 93 16 97 %  11/13/12 2000 - - - 17 -  11/13/12 1411 135/69 mmHg 98.1 F (36.7 C) 94 18 98 %  11/13/12 1045 139/75 mmHg 98.1 F (36.7 C) 92 18 97 %  11/13/12  1030 123/66 mmHg 97.8 F (36.6 C) 96 - 99 %  11/13/12 1015 119/69 mmHg - 97 14 97 %  11/13/12 0951 126/72 mmHg 97.6 F (36.4 C) - - -     Recent laboratory studies:  Recent Labs  11/14/12 0545  WBC 9.0  HGB 10.5*  HCT 30.9*  PLT 158  NA 135  K 5.2*  CL 104  CO2 22  BUN 26*  CREATININE 1.22  GLUCOSE 176*  CALCIUM 8.4     Discharge Medications:     Medication List    STOP taking these medications       Co Q 10 100 MG Caps     Fish Oil 1200 MG Caps     glucosamine-chondroitin 500-400 MG tablet      TAKE these medications       acetaminophen 325 MG tablet  Commonly known as:  TYLENOL  Take 2 tablets (650 mg total) by mouth every 6 (six) hours as needed for pain or fever.     aspirin 325 MG tablet  1 tablet 2 times a day for 1 month to prevent blood clots.  After one month OK to resume daily dose     atorvastatin 10 MG tablet  Commonly known as:  LIPITOR  Take 10 mg by mouth daily with  breakfast.     azelastine 137 MCG/SPRAY nasal spray  Commonly known as:  ASTELIN  Place 1 spray into the nose 2 (two) times daily as needed. For congestion; Use in each nostril as directed     bisacodyl 5 MG EC tablet  Commonly known as:  DULCOLAX  Take 2 tablets every night with dinner until bowel movement.  LAXITIVE.  Restart if two days since last bowel movement     cetirizine 10 MG tablet  Commonly known as:  ZYRTEC  Take 10 mg by mouth daily. Pt takes zyrtec for 30 days then allegra for 30 days , pt alternates     cholecalciferol 1000 UNITS tablet  Commonly known as:  VITAMIN D  Take 1,000 Units by mouth daily.     EPINEPHrine 0.3 mg/0.3 mL Soaj  Commonly known as:  EPI-PEN  Inject 0.3 mg into the muscle once.     fexofenadine 180 MG tablet  Commonly known as:  ALLEGRA  Take 180 mg by mouth daily with breakfast. Pt takes allegra for 30 days then zyrtec for 30 days     fluticasone 50 MCG/ACT nasal spray  Commonly known as:  FLONASE  Place 2 sprays into the nose daily. Do not use while on "IL2"     lisinopril 20 MG tablet  Commonly known as:  PRINIVIL,ZESTRIL  Take 20 mg by mouth 2 (two) times daily. For raised BP while on "IL2", if b/p low per pt only takes one per day per MD     magnesium gluconate 500 MG tablet  Commonly known as:  MAGONATE  Take 500 mg by mouth 2 (two) times daily.     multivitamin with minerals Tabs  Take 1 tablet by mouth daily.     oxyCODONE 5 MG immediate release tablet  Commonly known as:  Oxy IR/ROXICODONE  1-2 tablets every 4-6 hrs as needed for pain     pregabalin 75 MG capsule  Commonly known as:  LYRICA  Take 75 mg by mouth at bedtime and may repeat dose one time if needed.     ranitidine 75 MG tablet  Commonly known as:  ZANTAC  Take 75  mg by mouth daily as needed for heartburn.     vitamin C 500 MG tablet  Commonly known as:  ASCORBIC ACID  Take 500 mg by mouth daily.        Diagnostic Studies: Dg Chest 2 View  11/07/2012    *RADIOLOGY REPORT*  Clinical Data: Unicompartmental degenerative disease of the left knee.  Hypertension, coronary artery disease post myocardial infarction 1995.  Shortness of breath.  Diabetes.  CHEST - 2 VIEW  Comparison: 08/20/2010  Findings: Heart and mediastinal contours are within normal limits. The lung fields appear clear with no signs of focal infiltrate or congestive failure.  No pleural fluid or significant peribronchial cuffing is seen. Right pulmonary metastasis seen on the prior exam are no longer apparent.  The patient has undergone right shoulder arthroplasty. Degenerative changes of the lower cervical spine are noted.  IMPRESSION: No worrisome focal or acute cardiopulmonary abnormality identified.   Original Report Authenticated By: Rhodia Albright, M.D.    Disposition: 01-Home or Self Care      Discharge Orders   Future Appointments Provider Department Dept Phone   11/28/2012 10:30 AM Adam Gus Rankin, DO [facility] NEUROLOGY [place] Otto [phone]   Future Orders Complete By Expires     CPM  As directed     Comments:      Continuous passive motion machine (CPM):      Use the CPM from 0 to 90 for 6 hours per day.       You may break it up into 2 or 3 sessions per day.      Use CPM for 2 weeks or until you are told to stop.    Call MD / Call 911  As directed     Comments:      If you experience chest pain or shortness of breath, CALL 911 and be transported to the hospital emergency room.  If you develope a fever above 101 F, pus (white drainage) or increased drainage or redness at the wound, or calf pain, call your surgeon's office.    Change dressing  As directed     Comments:      Change the dressing daily with sterile 4 x 4 inch gauze dressing and apply TED hose.  You may clean the incision with alcohol prior to redressing.    Constipation Prevention  As directed     Comments:      Drink plenty of fluids.  Prune juice may be helpful.  You may use a stool softener, such  as Colace (over the counter) 100 mg twice a day.  Use MiraLax (over the counter) for constipation as needed.    Diet - low sodium heart healthy  As directed     Discharge instructions  As directed     Comments:      Total Knee Replacement Care After Refer to this sheet in the next few weeks. These discharge instructions provide you with general information on caring for yourself after you leave the hospital. Your caregiver may also give you specific instructions. Your treatment has been planned according to the most current medical practices available, but unavoidable complications sometimes occur. If you have any problems or questions after discharge, please call your caregiver. Regaining a near full range of motion of your knee within the first 3 to 6 weeks after surgery is critical. HOME CARE INSTRUCTIONS  You may resume a normal diet and activities as directed.  Perform exercises as directed.  Place yellow foam block, yellow  side up under heel at all times except when in CPM or when walking.  DO NOT modify, tear, cut, or change in any way. You will receive physical therapy daily  Take showers instead of baths until informed otherwise.  Change bandages (dressings)daily Do not take over-the-counter or prescription medicines for pain, discomfort, or fever. Eat a well-balanced diet.  Avoid lifting or driving until you are instructed otherwise.  Make an appointment to see your caregiver for stitches (suture) or staple removal as directed.  If you have been sent home with a continuous passive motion machine (CPM machine), 0-90 degrees 6 hrs a day   2 hrs a shift SEEK MEDICAL CARE IF: You have swelling of your calf or leg.  You develop shortness of breath or chest pain.  You have redness, swelling, or increasing pain in the wound.  There is pus or any unusual drainage coming from the surgical site.  You notice a bad smell coming from the surgical site or dressing.  The surgical site breaks open  after sutures or staples have been removed.  There is persistent bleeding from the suture or staple line.  You are getting worse or are not improving.  You have any other questions or concerns.  SEEK IMMEDIATE MEDICAL CARE IF:  You have a fever.  You develop a rash.  You have difficulty breathing.  You develop any reaction or side effects to medicines given.  Your knee motion is decreasing rather than improving.  MAKE SURE YOU:  Understand these instructions.  Will watch your condition.  Will get help right away if you are not doing well or get worse.    Do not put a pillow under the knee. Place it under the heel.  As directed     Comments:      Place yellow foam block, yellow side up under heel at all times except when in CPM or when walking.  DO NOT modify, tear, cut, or change in any way the yellow foam block.    Increase activity slowly as tolerated  As directed     TED hose  As directed     Comments:      Use stockings (TED hose) for 2 weeks on both leg(s).  You may remove them at night for sleeping.       Follow-up Information   Follow up with Nilda Simmer, MD On 11/28/2012. (appt time 10:15 am)    Contact information:   [address] [place] [address] [phone]        Signed: Pascal Lux 11/14/2012, 8:53 AM

## 2012-11-28 ENCOUNTER — Ambulatory Visit: Payer: Medicare Other | Admitting: Neurology

## 2012-12-06 ENCOUNTER — Other Ambulatory Visit: Payer: Self-pay

## 2013-03-08 ENCOUNTER — Other Ambulatory Visit: Payer: Self-pay

## 2013-04-05 ENCOUNTER — Ambulatory Visit
Admission: RE | Admit: 2013-04-05 | Discharge: 2013-04-05 | Disposition: A | Discharge status: Home / Self Care | Payer: Medicare Other | Source: Ambulatory Visit | Attending: Internal Medicine | Admitting: Internal Medicine

## 2013-04-05 ENCOUNTER — Other Ambulatory Visit: Payer: Self-pay | Admitting: Internal Medicine

## 2013-04-05 DIAGNOSIS — M79609 Pain in unspecified limb: Secondary | ICD-10-CM

## 2013-04-10 ENCOUNTER — Other Ambulatory Visit (HOSPITAL_COMMUNITY): Payer: Self-pay | Admitting: Orthopedic Surgery

## 2013-04-11 NOTE — Pre-Procedure Instructions (Signed)
JARAMIAH BOSSARD  04/11/2013   Your procedure is scheduled on:  04/13/13  Report to [facility]  2 * 3 at 1230 AM.  Call this number if you have problems the morning of surgery: [phone]   Remember:   Do not eat food or drink liquids after midnight.   Take these medicines the morning of surgery with A SIP OF WATER: nasal spray,flonase,oxycotin,zantac   Do not wear jewelry, make-up or nail polish.  Do not wear lotions, powders, or perfumes. You may wear deodorant.  Do not shave 48 hours prior to surgery. Men may shave face and neck.  Do not bring valuables to the hospital.  [facility] is not responsible                  for any belongings or valuables.               Contacts, dentures or bridgework may not be worn into surgery.  Leave suitcase in the car. After surgery it may be brought to your room.  For patients admitted to the hospital, discharge time is determined by your                treatment team.               Patients discharged the day of surgery will not be allowed to drive  home.  Name and phone number of your driver: family  Special Instructions: Shower using CHG 2 nights before surgery and the night before surgery.  If you shower the day of surgery use CHG.  Use special wash - you have one bottle of CHG for all showers.  You should use approximately 1/3 of the bottle for each shower.   Please read over the following fact sheets that you were given: Pain Booklet, Coughing and Deep Breathing and Surgical Site Infection Prevention

## 2013-04-12 ENCOUNTER — Encounter (HOSPITAL_COMMUNITY): Payer: Self-pay | Admitting: Pharmacy Technician

## 2013-04-12 ENCOUNTER — Encounter (HOSPITAL_COMMUNITY)
Admission: RE | Admit: 2013-04-12 | Discharge: 2013-04-12 | Disposition: A | Discharge status: Home / Self Care | Payer: Medicare Other | Source: Ambulatory Visit | Attending: Orthopedic Surgery | Admitting: Orthopedic Surgery

## 2013-04-12 ENCOUNTER — Encounter (HOSPITAL_COMMUNITY): Payer: Self-pay

## 2013-04-12 LAB — COMPREHENSIVE METABOLIC PANEL
ALT: 29 U/L (ref 0–53)
Alkaline Phosphatase: 85 U/L (ref 39–117)
CO2: 23 mEq/L (ref 19–32)
Calcium: 9.6 mg/dL (ref 8.4–10.5)
Creatinine, Ser: 1.24 mg/dL (ref 0.50–1.35)
GFR calc Af Amer: 65 mL/min — ABNORMAL LOW (ref >90)
GFR calc non Af Amer: 56 mL/min — ABNORMAL LOW (ref >90)
Glucose, Bld: 136 mg/dL — ABNORMAL HIGH (ref 70–99)
Potassium: 4.7 mEq/L (ref 3.5–5.1)
Sodium: 134 mEq/L — ABNORMAL LOW (ref 135–145)
Total Protein: 7.1 g/dL (ref 6.0–8.3)

## 2013-04-12 LAB — CBC
HCT: 40.5 % (ref 39.0–52.0)
Hemoglobin: 14 g/dL (ref 13.0–17.0)
MCH: 29 pg (ref 26.0–34.0)
MCHC: 34.6 g/dL (ref 30.0–36.0)
MCV: 84 fL (ref 78.0–100.0)

## 2013-04-12 LAB — PROTIME-INR
INR: 0.9 (ref 0.00–1.49)
Prothrombin Time: 12 seconds (ref 11.6–15.2)

## 2013-04-12 MED ORDER — CLINDAMYCIN PHOSPHATE 900 MG/50ML IV SOLN
900.0000 mg | INTRAVENOUS | Status: AC
  Administered 2013-04-13: 900 mg via INTRAVENOUS
  Filled 2013-04-12: qty 50

## 2013-04-12 NOTE — Progress Notes (Signed)
Anesthesia Chart Review:  See my note from 11/07/12 prior to left TKA.  He is now scheduled for right great toe cheilectomy, debride tendon and abscess, apply Stimulan beads on 04/13/13 by Dr. Lajoyce Corners.  Procedure is posted for GA.  Prior cardiology and oncology notes mentioned in my previous note can be found under the Media tab, under Correspondence from 11/13/12 and under Correspondence entitled [facility].  Preoperative labs noted.  He tolerated TKA earlier this year.  Anticipate that he can proceed as planned.  Velna Ochs [facility]/Anesthesiology Phone [phone] 04/12/2013 2:42 PM

## 2013-04-13 ENCOUNTER — Inpatient Hospital Stay (HOSPITAL_COMMUNITY)
Admission: RE | Admit: 2013-04-13 | Discharge: 2013-04-16 | DRG: 502 | Disposition: A | Discharge status: Home / Self Care | Payer: Medicare Other | Source: Ambulatory Visit | Attending: Orthopedic Surgery | Admitting: Orthopedic Surgery

## 2013-04-13 ENCOUNTER — Encounter (HOSPITAL_COMMUNITY)
Admission: RE | Disposition: A | Discharge status: Home / Self Care | Payer: Self-pay | Source: Ambulatory Visit | Attending: Orthopedic Surgery

## 2013-04-13 ENCOUNTER — Encounter (HOSPITAL_COMMUNITY): Payer: Self-pay | Admitting: Certified Registered"

## 2013-04-13 ENCOUNTER — Ambulatory Visit (HOSPITAL_COMMUNITY): Payer: Medicare Other | Admitting: Anesthesiology

## 2013-04-13 ENCOUNTER — Encounter (HOSPITAL_COMMUNITY): Payer: Medicare Other | Admitting: Vascular Surgery

## 2013-04-13 DIAGNOSIS — L02619 Cutaneous abscess of unspecified foot: Secondary | ICD-10-CM | POA: Diagnosis present

## 2013-04-13 DIAGNOSIS — E119 Type 2 diabetes mellitus without complications: Secondary | ICD-10-CM | POA: Diagnosis present

## 2013-04-13 DIAGNOSIS — Z96619 Presence of unspecified artificial shoulder joint: Secondary | ICD-10-CM

## 2013-04-13 DIAGNOSIS — Z880 Allergy status to penicillin: Secondary | ICD-10-CM

## 2013-04-13 DIAGNOSIS — M2020 Hallux rigidus, unspecified foot: Secondary | ICD-10-CM | POA: Diagnosis present

## 2013-04-13 DIAGNOSIS — L03039 Cellulitis of unspecified toe: Secondary | ICD-10-CM | POA: Diagnosis present

## 2013-04-13 DIAGNOSIS — I252 Old myocardial infarction: Secondary | ICD-10-CM

## 2013-04-13 DIAGNOSIS — Z87891 Personal history of nicotine dependence: Secondary | ICD-10-CM

## 2013-04-13 DIAGNOSIS — H9190 Unspecified hearing loss, unspecified ear: Secondary | ICD-10-CM | POA: Diagnosis present

## 2013-04-13 DIAGNOSIS — I129 Hypertensive chronic kidney disease with stage 1 through stage 4 chronic kidney disease, or unspecified chronic kidney disease: Secondary | ICD-10-CM | POA: Diagnosis present

## 2013-04-13 DIAGNOSIS — E785 Hyperlipidemia, unspecified: Secondary | ICD-10-CM | POA: Diagnosis present

## 2013-04-13 DIAGNOSIS — L02611 Cutaneous abscess of right foot: Secondary | ICD-10-CM

## 2013-04-13 DIAGNOSIS — M66879 Spontaneous rupture of other tendons, unspecified ankle and foot: Principal | ICD-10-CM | POA: Diagnosis present

## 2013-04-13 DIAGNOSIS — Z8249 Family history of ischemic heart disease and other diseases of the circulatory system: Secondary | ICD-10-CM

## 2013-04-13 DIAGNOSIS — K219 Gastro-esophageal reflux disease without esophagitis: Secondary | ICD-10-CM | POA: Diagnosis present

## 2013-04-13 DIAGNOSIS — Z96659 Presence of unspecified artificial knee joint: Secondary | ICD-10-CM

## 2013-04-13 DIAGNOSIS — N183 Chronic kidney disease, stage 3 unspecified: Secondary | ICD-10-CM | POA: Diagnosis present

## 2013-04-13 HISTORY — PX: CHEILECTOMY: SHX1336

## 2013-04-13 LAB — GLUCOSE, CAPILLARY
Glucose-Capillary: 88 mg/dL (ref 70–99)
Glucose-Capillary: 170 mg/dL — ABNORMAL HIGH (ref 70–99)

## 2013-04-13 SURGERY — CHEILECTOMY
Anesthesia: General | Site: Foot | Laterality: Right

## 2013-04-13 MED ORDER — LISINOPRIL 20 MG PO TABS
20.0000 mg | ORAL_TABLET | Freq: Every day | ORAL | Status: DC
Start: 2013-04-13 — End: 2013-04-16
  Administered 2013-04-13 – 2013-04-16 (×4): 20 mg via ORAL
  Filled 2013-04-13 (×4): qty 1

## 2013-04-13 MED ORDER — SODIUM CHLORIDE 0.9 % IV SOLN
INTRAVENOUS | Status: DC | PRN
  Administered 2013-04-13 (×2): via INTRAVENOUS

## 2013-04-13 MED ORDER — VANCOMYCIN HCL 500 MG IV SOLR
INTRAVENOUS | Status: AC
  Filled 2013-04-13: qty 1000

## 2013-04-13 MED ORDER — PANTOPRAZOLE SODIUM 40 MG PO TBEC
40.0000 mg | DELAYED_RELEASE_TABLET | Freq: Every day | ORAL | Status: DC
  Administered 2013-04-13 – 2013-04-16 (×4): 40 mg via ORAL
  Filled 2013-04-13 (×4): qty 1

## 2013-04-13 MED ORDER — OXYCODONE HCL 5 MG PO TABS: 5.0000 mg | ORAL_TABLET | Freq: Once | ORAL | Status: DC | PRN

## 2013-04-13 MED ORDER — LORATADINE 10 MG PO TABS
10.0000 mg | ORAL_TABLET | Freq: Every day | ORAL | Status: DC
  Filled 2013-04-13: qty 1

## 2013-04-13 MED ORDER — CLINDAMYCIN HCL 300 MG PO CAPS
300.0000 mg | ORAL_CAPSULE | Freq: Three times a day (TID) | ORAL | Status: DC
  Filled 2013-04-13 (×2): qty 1

## 2013-04-13 MED ORDER — HYDROCODONE-ACETAMINOPHEN 5-325 MG PO TABS
1.0000 | ORAL_TABLET | ORAL | Status: DC | PRN
  Filled 2013-04-13: qty 2

## 2013-04-13 MED ORDER — MAGNESIUM CITRATE PO SOLN: 1.0000 | Freq: Once | ORAL | Status: AC | PRN

## 2013-04-13 MED ORDER — HYDROMORPHONE HCL PF 1 MG/ML IJ SOLN
0.5000 mg | INTRAMUSCULAR | Status: DC | PRN
  Administered 2013-04-13 – 2013-04-14 (×3): 1 mg via INTRAVENOUS
  Filled 2013-04-13 (×3): qty 1

## 2013-04-13 MED ORDER — ONDANSETRON HCL 4 MG PO TABS: 4.0000 mg | ORAL_TABLET | Freq: Four times a day (QID) | ORAL | Status: DC | PRN

## 2013-04-13 MED ORDER — ONDANSETRON HCL 4 MG/2ML IJ SOLN
INTRAMUSCULAR | Status: DC | PRN
  Administered 2013-04-13: 4 mg via INTRAVENOUS

## 2013-04-13 MED ORDER — VANCOMYCIN HCL 500 MG IV SOLR
INTRAVENOUS | Status: DC | PRN
  Administered 2013-04-13: 500 mg

## 2013-04-13 MED ORDER — PROPOFOL 10 MG/ML IV BOLUS
INTRAVENOUS | Status: DC | PRN
  Administered 2013-04-13: 160 mg via INTRAVENOUS

## 2013-04-13 MED ORDER — OXYCODONE HCL 5 MG/5ML PO SOLN: 5.0000 mg | Freq: Once | ORAL | Status: DC | PRN

## 2013-04-13 MED ORDER — METOCLOPRAMIDE HCL 5 MG/ML IJ SOLN: 5.0000 mg | Freq: Three times a day (TID) | INTRAMUSCULAR | Status: DC | PRN

## 2013-04-13 MED ORDER — ATORVASTATIN CALCIUM 10 MG PO TABS
10.0000 mg | ORAL_TABLET | Freq: Every day | ORAL | Status: DC
  Administered 2013-04-14 – 2013-04-15 (×2): 10 mg via ORAL
  Filled 2013-04-13 (×4): qty 1

## 2013-04-13 MED ORDER — FLUTICASONE PROPIONATE 50 MCG/ACT NA SUSP
2.0000 | Freq: Every day | NASAL | Status: DC | PRN
  Administered 2013-04-14: 2 via NASAL
  Filled 2013-04-13: qty 16

## 2013-04-13 MED ORDER — GENTAMICIN SULFATE 40 MG/ML IJ SOLN
INTRAMUSCULAR | Status: AC
  Filled 2013-04-13: qty 4

## 2013-04-13 MED ORDER — MIDAZOLAM HCL 5 MG/5ML IJ SOLN
INTRAMUSCULAR | Status: DC | PRN
  Administered 2013-04-13: 2 mg via INTRAVENOUS

## 2013-04-13 MED ORDER — HYDROMORPHONE HCL PF 1 MG/ML IJ SOLN
INTRAMUSCULAR | Status: AC
  Administered 2013-04-13: 0.5 mg via INTRAVENOUS
  Filled 2013-04-13: qty 1

## 2013-04-13 MED ORDER — LORATADINE 10 MG PO TABS
10.0000 mg | ORAL_TABLET | Freq: Every day | ORAL | Status: DC
  Administered 2013-04-13 – 2013-04-16 (×4): 10 mg via ORAL
  Filled 2013-04-13 (×4): qty 1

## 2013-04-13 MED ORDER — GENTAMICIN SULFATE 40 MG/ML IJ SOLN
INTRAMUSCULAR | Status: DC | PRN
  Administered 2013-04-13: 160 mg

## 2013-04-13 MED ORDER — LIDOCAINE HCL (CARDIAC) 20 MG/ML IV SOLN
INTRAVENOUS | Status: DC | PRN
  Administered 2013-04-13: 80 mg via INTRAVENOUS

## 2013-04-13 MED ORDER — CLINDAMYCIN PHOSPHATE 600 MG/50ML IV SOLN
600.0000 mg | Freq: Four times a day (QID) | INTRAVENOUS | Status: AC
  Administered 2013-04-13 – 2013-04-16 (×11): 600 mg via INTRAVENOUS
  Filled 2013-04-13 (×12): qty 50

## 2013-04-13 MED ORDER — AZELASTINE HCL 0.1 % NA SOLN
1.0000 | Freq: Two times a day (BID) | NASAL | Status: DC | PRN
  Administered 2013-04-14: 1 via NASAL
  Filled 2013-04-13: qty 30

## 2013-04-13 MED ORDER — ASPIRIN EC 325 MG PO TBEC
325.0000 mg | DELAYED_RELEASE_TABLET | Freq: Every day | ORAL | Status: DC
  Administered 2013-04-13 – 2013-04-16 (×4): 325 mg via ORAL
  Filled 2013-04-13 (×4): qty 1

## 2013-04-13 MED ORDER — METOCLOPRAMIDE HCL 10 MG PO TABS: 5.0000 mg | ORAL_TABLET | Freq: Three times a day (TID) | ORAL | Status: DC | PRN

## 2013-04-13 MED ORDER — OXYCODONE-ACETAMINOPHEN 5-325 MG PO TABS
1.0000 | ORAL_TABLET | ORAL | Status: DC | PRN
  Administered 2013-04-13: 2 via ORAL
  Administered 2013-04-14: 1 via ORAL
  Administered 2013-04-14 (×2): 2 via ORAL
  Administered 2013-04-15 – 2013-04-16 (×3): 1 via ORAL
  Filled 2013-04-13 (×2): qty 1
  Filled 2013-04-13 (×4): qty 2
  Filled 2013-04-13: qty 1

## 2013-04-13 MED ORDER — DOCUSATE SODIUM 100 MG PO CAPS
100.0000 mg | ORAL_CAPSULE | Freq: Two times a day (BID) | ORAL | Status: DC
  Administered 2013-04-13 – 2013-04-16 (×6): 100 mg via ORAL
  Filled 2013-04-13 (×7): qty 1

## 2013-04-13 MED ORDER — SODIUM CHLORIDE 0.9 % IV SOLN
INTRAVENOUS | Status: DC
  Administered 2013-04-13: 20 mL/h via INTRAVENOUS

## 2013-04-13 MED ORDER — FENTANYL CITRATE 0.05 MG/ML IJ SOLN
INTRAMUSCULAR | Status: DC | PRN
  Administered 2013-04-13 (×2): 50 ug via INTRAVENOUS

## 2013-04-13 MED ORDER — ONDANSETRON HCL 4 MG/2ML IJ SOLN: 4.0000 mg | Freq: Four times a day (QID) | INTRAMUSCULAR | Status: DC | PRN

## 2013-04-13 MED ORDER — LACTATED RINGERS IV SOLN
INTRAVENOUS | Status: DC
  Administered 2013-04-13: 13:00:00 via INTRAVENOUS

## 2013-04-13 MED ORDER — MAGNESIUM GLUCONATE 500 MG PO TABS
500.0000 mg | ORAL_TABLET | Freq: Two times a day (BID) | ORAL | Status: DC
  Administered 2013-04-13 – 2013-04-16 (×6): 500 mg via ORAL
  Filled 2013-04-13 (×7): qty 1

## 2013-04-13 MED ORDER — PHENYLEPHRINE HCL 10 MG/ML IJ SOLN
INTRAMUSCULAR | Status: DC | PRN
  Administered 2013-04-13 (×4): 80 ug via INTRAVENOUS

## 2013-04-13 MED ORDER — BISACODYL 5 MG PO TBEC
5.0000 mg | DELAYED_RELEASE_TABLET | Freq: Every day | ORAL | Status: DC | PRN
  Administered 2013-04-14 – 2013-04-15 (×2): 5 mg via ORAL
  Filled 2013-04-13 (×2): qty 1

## 2013-04-13 MED ORDER — HYDROMORPHONE HCL PF 1 MG/ML IJ SOLN
0.2500 mg | INTRAMUSCULAR | Status: DC | PRN
  Administered 2013-04-13: 0.5 mg via INTRAVENOUS

## 2013-04-13 MED ORDER — 0.9 % SODIUM CHLORIDE (POUR BTL) OPTIME
TOPICAL | Status: DC | PRN
  Administered 2013-04-13: 1000 mL

## 2013-04-13 MED ORDER — PROMETHAZINE HCL 25 MG/ML IJ SOLN: 6.2500 mg | INTRAMUSCULAR | Status: DC | PRN

## 2013-04-13 SURGICAL SUPPLY — 52 items
BLADE AVERAGE 25X9 (BLADE) IMPLANT
BLADE MINI RND TIP GREEN BEAV (BLADE) IMPLANT
BNDG COHESIVE 1X5 TAN STRL LF (GAUZE/BANDAGES/DRESSINGS) IMPLANT
BNDG COHESIVE 4X5 TAN STRL (GAUZE/BANDAGES/DRESSINGS) ×2 IMPLANT
BNDG COHESIVE 6X5 TAN STRL LF (GAUZE/BANDAGES/DRESSINGS) IMPLANT
BNDG ESMARK 4X9 LF (GAUZE/BANDAGES/DRESSINGS) ×2 IMPLANT
BNDG GAUZE ELAST 4 BULKY (GAUZE/BANDAGES/DRESSINGS) ×2 IMPLANT
BNDG GAUZE STRTCH 6 (GAUZE/BANDAGES/DRESSINGS) IMPLANT
CLOTH BEACON ORANGE TIMEOUT ST (SAFETY) ×2 IMPLANT
CORDS BIPOLAR (ELECTRODE) ×2 IMPLANT
COTTON STERILE ROLL (GAUZE/BANDAGES/DRESSINGS) IMPLANT
COVER SURGICAL LIGHT HANDLE (MISCELLANEOUS) ×2 IMPLANT
CUFF TOURNIQUET SINGLE 18IN (TOURNIQUET CUFF) IMPLANT
CUFF TOURNIQUET SINGLE 24IN (TOURNIQUET CUFF) IMPLANT
DRAPE OEC MINIVIEW 54X84 (DRAPES) IMPLANT
DRAPE U-SHAPE 47X51 STRL (DRAPES) ×2 IMPLANT
DRSG ADAPTIC 3X8 NADH LF (GAUZE/BANDAGES/DRESSINGS) IMPLANT
DRSG EMULSION OIL 3X3 NADH (GAUZE/BANDAGES/DRESSINGS) ×2 IMPLANT
DURAPREP 26ML APPLICATOR (WOUND CARE) ×2 IMPLANT
ELECT REM PT RETURN 9FT ADLT (ELECTROSURGICAL) ×2
ELECTRODE REM PT RTRN 9FT ADLT (ELECTROSURGICAL) ×1 IMPLANT
GLOVE BIOGEL PI IND STRL 9 (GLOVE) ×1 IMPLANT
GLOVE BIOGEL PI INDICATOR 9 (GLOVE) ×1
GLOVE SURG ORTHO 9.0 STRL STRW (GLOVE) ×2 IMPLANT
GOWN PREVENTION PLUS XLARGE (GOWN DISPOSABLE) ×2 IMPLANT
GOWN SRG XL XLNG 56XLVL 4 (GOWN DISPOSABLE) ×1 IMPLANT
GOWN STRL NON-REIN XL XLG LVL4 (GOWN DISPOSABLE) ×1
KIT BASIN OR (CUSTOM PROCEDURE TRAY) ×2 IMPLANT
KIT ROOM TURNOVER OR (KITS) ×2 IMPLANT
KIT STIMULAN RAPID CURE  10CC (Orthopedic Implant) ×1 IMPLANT
KIT STIMULAN RAPID CURE 10CC (Orthopedic Implant) ×1 IMPLANT
MANIFOLD NEPTUNE II (INSTRUMENTS) ×2 IMPLANT
NEEDLE HYPO 25GX1X1/2 BEV (NEEDLE) IMPLANT
NS IRRIG 1000ML POUR BTL (IV SOLUTION) ×2 IMPLANT
PACK ORTHO EXTREMITY (CUSTOM PROCEDURE TRAY) ×2 IMPLANT
PAD ABD 8X10 STRL (GAUZE/BANDAGES/DRESSINGS) ×2 IMPLANT
PAD ARMBOARD 7.5X6 YLW CONV (MISCELLANEOUS) ×4 IMPLANT
PAD CAST 4YDX4 CTTN HI CHSV (CAST SUPPLIES) IMPLANT
PADDING CAST COTTON 4X4 STRL (CAST SUPPLIES)
SPECIMEN JAR SMALL (MISCELLANEOUS) ×2 IMPLANT
SPONGE GAUZE 4X4 12PLY (GAUZE/BANDAGES/DRESSINGS) ×2 IMPLANT
SUCTION FRAZIER TIP 10 FR DISP (SUCTIONS) IMPLANT
SUT ETHILON 2 0 FS 18 (SUTURE) IMPLANT
SUT PROLENE 2 0 CT 1 (SUTURE) ×2 IMPLANT
SUT VIC AB 2-0 CT1 27 (SUTURE) ×1
SUT VIC AB 2-0 CT1 TAPERPNT 27 (SUTURE) ×1 IMPLANT
SUT VIC AB 2-0 FS1 27 (SUTURE) IMPLANT
SYR CONTROL 10ML LL (SYRINGE) IMPLANT
TOWEL OR 17X24 6PK STRL BLUE (TOWEL DISPOSABLE) ×2 IMPLANT
TOWEL OR 17X26 10 PK STRL BLUE (TOWEL DISPOSABLE) ×2 IMPLANT
TUBE CONNECTING 12X1/4 (SUCTIONS) IMPLANT
WATER STERILE IRR 1000ML POUR (IV SOLUTION) ×2 IMPLANT

## 2013-04-13 NOTE — Anesthesia Postprocedure Evaluation (Signed)
  Anesthesia Post-op Note  Patient: Donald Gray  Procedure(s) Performed: Procedure(s) with comments: Right Great Toe Cheilectomy, Debride Tendon and Abscess, Apply Stimulan Beads (Right) - Right Great Toe Cheilectomy, Debride Tendon and Abscess, Apply Stimulan Beads  Patient Location: PACU  Anesthesia Type:General  Level of Consciousness: awake  Airway and Oxygen Therapy: Patient Spontanous Breathing  Post-op Pain: mild  Post-op Assessment: Post-op Vital signs reviewed  Post-op Vital Signs: stable  Complications: No apparent anesthesia complications

## 2013-04-13 NOTE — Progress Notes (Signed)
Patient underwent debridement for infection of the right great toe MTP joint with chilectomy repair the EHL tendon and placement of antibiotic beads. Will plan to keep the patient in over the weekend for IV antibiotics anticipate discharge to home on Monday.

## 2013-04-13 NOTE — H&P (Signed)
Donald Gray is an 72 y.o. male.   Chief Complaint: Infection right foot with rupture of EHL HPI: Patient is a 72 year old gentleman with diabetes peripheral vascular disease status post blunt trauma to the right foot who sustained an open wound infection and rupture of the EHL. Now has an abscess with the open wound.  Past Medical History  Diagnosis Date  . Hypertension   . Cancer     pt with left kidney ca with resultant nephrectomy and mets to the lungs.  . Coronary artery disease   . Hyperlipidemia   . Heart attack     1995   . Shortness of breath     related to sutent and anti PD-1( anticancer drugs )   . Anemia     at time of cancer   . Blood transfusion     last one 2011   . GERD (gastroesophageal reflux disease)   . Arthritis     knees   . Diverticulosis   . H/O interleukin-2 treatment     2012 - for tx of metastatic renal cell carcinoma   . Diabetes     hx of pt not on meds at this time   . Hearing loss     hearing aids   . Renal disorder     hx of kidney cancer , CKD III  . Left knee DJD   . Dysrhythmia     atrial fib hx of n   hank smith    Past Surgical History  Procedure Laterality Date  . Nephrectomy    . Cardiac surgery    . Knee surgery    . Pilonidal cyst drainage    . Shoulder surgery  1997    right shoulder  . Kidney surgery  12/14/2009    removed right kidney  . Joint replacement      right shoulder replacement   . Tonsillectomy      and adenoidectomy   . Cardiac catheterization      05/2009   . Coronary angioplasty with stent placement      1997 at [facility] to RCA   . Inguinal hernia repair  09/22/2011    Procedure: HERNIA REPAIR INGUINAL ADULT BILATERAL;  Surgeon: Wilmon Arms. Corliss Skains, MD;  Location: [facility] ORS;  Service: General;  Laterality: Bilateral;  . Orchiectomy  09/22/2011    Procedure: ORCHIECTOMY;  Surgeon: Marcine Matar, MD;  Location: [facility] ORS;  Service: Urology;  Laterality: Left;  Left Testicular Exploration, Inguinal Approach,   Testicular Orchiectomy,   . Hernia repair  09/22/11    BIH  . Total knee arthroplasty Left 11/13/2012    Dr Thurston Hole  . Knee arthroscopy Left 11/13/2012    Procedure: ARTHROSCOPY KNEE;  Surgeon: Nilda Simmer, MD;  Location: [facility] OR;  Service: Orthopedics;  Laterality: Left;  . Total knee arthroplasty Left 11/13/2012    Procedure: TOTAL KNEE ARTHROPLASTY;  Surgeon: Nilda Simmer, MD;  Location: [facility] OR;  Service: Orthopedics;  Laterality: Left;    Family History  Problem Relation Age of Onset  . Heart disease Father    Social History:  reports that he quit smoking about 51 years ago. His smoking use included Cigarettes. He has a 2 pack-year smoking history. He has never used smokeless tobacco. He reports that he drinks alcohol. He reports that he does not use illicit drugs.  Allergies:  Allergies  Allergen Reactions  . Penicillins Swelling  . Ambien [Zolpidem Tartrate] Other (See Comments)    Causes  hallucinations.     No prescriptions prior to admission    Results for orders placed during the hospital encounter of 04/12/13 (from the past 48 hour(s))  APTT     Status: None   Collection Time    04/12/13 11:48 AM      Result Value Range   aPTT 33  24 - 37 seconds  CBC     Status: Abnormal   Collection Time    04/12/13 11:48 AM      Result Value Range   WBC 4.7  4.0 - 10.5 K/uL   RBC 4.82  4.22 - 5.81 MIL/uL   Hemoglobin 14.0  13.0 - 17.0 g/dL   HCT 09.8  11.9 - 14.7 %   MCV 84.0  78.0 - 100.0 fL   MCH 29.0  26.0 - 34.0 pg   MCHC 34.6  30.0 - 36.0 g/dL   RDW 82.9 (*) 56.2 - 13.0 %   Platelets 203  150 - 400 K/uL  COMPREHENSIVE METABOLIC PANEL     Status: Abnormal   Collection Time    04/12/13 11:48 AM      Result Value Range   Sodium 134 (*) 135 - 145 mEq/L   Potassium 4.7  3.5 - 5.1 mEq/L   Chloride 100  96 - 112 mEq/L   CO2 23  19 - 32 mEq/L   Glucose, Bld 136 (*) 70 - 99 mg/dL   BUN 27 (*) 6 - 23 mg/dL   Creatinine, Ser 8.65  0.50 - 1.35 mg/dL   Calcium 9.6  8.4  - 78.4 mg/dL   Total Protein 7.1  6.0 - 8.3 g/dL   Albumin 3.8  3.5 - 5.2 g/dL   AST 23  0 - 37 U/L   ALT 29  0 - 53 U/L   Alkaline Phosphatase 85  39 - 117 U/L   Total Bilirubin 0.3  0.3 - 1.2 mg/dL   GFR calc non Af Amer 56 (*) >90 mL/min   GFR calc Af Amer 65 (*) >90 mL/min   Comment: (NOTE)     The eGFR has been calculated using the CKD EPI equation.     This calculation has not been validated in all clinical situations.     eGFR's persistently <90 mL/min signify possible Chronic Kidney     Disease.  PROTIME-INR     Status: None   Collection Time    04/12/13 11:48 AM      Result Value Range   Prothrombin Time 12.0  11.6 - 15.2 seconds   INR 0.90  0.00 - 1.49   No results found.  Review of Systems  All other systems reviewed and are negative.    There were no vitals taken for this visit. Physical Exam  On examination patient has a palpable dorsalis pedis pulse there is an open wound with purulent drainage the EHL is retracted there is no active extension of the great toe. He has hallux rigidus with very minimal range of motion of the great toe. Assessment/Plan Assessment: Open wound with abscess dorsum of the right foot with rupture the EHL and hallux rigidus of the great toe MTP joint.  Plan: Will plan for cheilectomy debridement of the tendon rupture possible tendon repair possible placement of antibiotic beads debridement of the abscess of the open wound. Risks and benefits were discussed including persistent infection nonhealing of the skin need for additional surgery. Patient states he understands and wished to proceed at this time.  DUDA,MARCUS V  04/13/2013, 6:44 AM

## 2013-04-13 NOTE — Anesthesia Procedure Notes (Signed)
Procedure Name: LMA Insertion Date/Time: 04/13/2013 3:00 PM Performed by: Arlice Colt B Pre-anesthesia Checklist: Patient identified, Emergency Drugs available, Suction available, Patient being monitored and Timeout performed Patient Re-evaluated:Patient Re-evaluated prior to inductionOxygen Delivery Method: Circle system utilized Preoxygenation: Pre-oxygenation with 100% oxygen Intubation Type: IV induction LMA: LMA inserted LMA Size: 5.0 Number of attempts: 1 Placement Confirmation: positive ETCO2 and breath sounds checked- equal and bilateral Tube secured with: Tape Dental Injury: Teeth and Oropharynx as per pre-operative assessment

## 2013-04-13 NOTE — Anesthesia Preprocedure Evaluation (Addendum)
Anesthesia Evaluation  Patient identified by MRN, date of birth, ID band Patient awake    Reviewed: Allergy & Precautions, H&P , NPO status , Patient's Chart, lab work & pertinent test results  Airway       Dental   Pulmonary former smoker,  breath sounds clear to auscultation        Cardiovascular hypertension, + CAD and + Past MI + dysrhythmias Rhythm:Regular     Neuro/Psych    GI/Hepatic GERD-  ,  Endo/Other  diabetes  Renal/GU Renal disease     Musculoskeletal   Abdominal   Peds  Hematology   Anesthesia Other Findings   Reproductive/Obstetrics                          Anesthesia Physical Anesthesia Plan  ASA: III  Anesthesia Plan: General   Post-op Pain Management:    Induction: Intravenous  Airway Management Planned: LMA  Additional Equipment:   Intra-op Plan:   Post-operative Plan: Extubation in OR  Informed Consent: I have reviewed the patients History and Physical, chart, labs and discussed the procedure including the risks, benefits and alternatives for the proposed anesthesia with the patient or authorized representative who has indicated his/her understanding and acceptance.   Dental advisory given  Plan Discussed with: CRNA and Surgeon  Anesthesia Plan Comments:        Anesthesia Quick Evaluation

## 2013-04-13 NOTE — Op Note (Signed)
OPERATIVE REPORT  DATE OF SURGERY: 04/13/2013  PATIENT:  Donald Gray,  72 y.o. male  PRE-OPERATIVE DIAGNOSIS:  Abscess, Rupture EHL and Hallux Rigidus Right Great Toe  POST-OPERATIVE DIAGNOSIS:  Abscess, Rupture EHL and Hallux Rigidus Right Great Toe  PROCEDURE:  Procedure(s): Right Great Toe Cheilectomy,  Debride Tendon and Abscess,  Apply Stimulan Beads Repair EHL tendon  SURGEON:  Surgeon(s): Nadara Mustard, MD  ANESTHESIA:   general  EBL:  min ML  SPECIMEN:  No Specimen  TOURNIQUET:  * No tourniquets in log *  PROCEDURE DETAILS: Patient is a 72 year old gentleman with chronic hallux rigidus of the right great toe. Patient presented with ulcer and abscess and drainage from the EHL tendon do to the bony spurs hallux rigidus infection rupture chronically of the EHL patient presents at this time for surgical intervention. Risks and benefits were discussed including persistent infection nonhealing of the skin nonhealing of the tendon need for fusion of the great toe MTP joint. Patient and his wife state understand and wish to proceed at this time. Description of procedure patient was brought to the operating room and underwent a general anesthetic. After adequate levels of anesthesia were obtained patient's right lower extremity was prepped using DuraPrep and draped into a sterile field. A medial longitudinal incision was made over the MTP joint this was carried down to the retinaculum which was elevated a cheilectomy was performed patient had completely eburnated bone of the distal first metatarsal head as well as the base of the proximal phalanx. He underwent a excision of the bony spurs circumferentially around the joint a cheilectomy and ostectomy. There is no purulence no signs of any deep infection. Examination dorsally patient had a chronic rupture of the EHL tendon there was calcification of the tendon and the tendon was debrided and repaired with Prolene suture. The wounds  irrigated throughout the case. Antibiotic beads were then packed in the area of the infection there was no purulent abscess in the trainings sinus tract was debrided this appeared to be coming from the EHL tendon which appeared to be chronically ruptured from the bony spurs from the MTP joint. The antibiotic beads were placed with 5 cc of antibiotic beads with 500 mg vancomycin and 160 mg of gentamicin. The retinaculum was closed using 2-0 Vicryl the skin was closed using 2-0 nylon the wound was covered Adaptic orthopedic sponges AB dressing Kerlix and Coban. Patient was extubated taken the PACU in stable condition.  PLAN OF CARE: Admit to inpatient   PATIENT DISPOSITION:  PACU - hemodynamically stable.   Nadara Mustard, MD 04/13/2013 5:14 PM

## 2013-04-13 NOTE — Progress Notes (Signed)
Orthopedic Tech Progress Note Patient Details:  Donald Gray 27-Oct-1940 [id number]  Ortho Devices Type of Ortho Device: Postop shoe/boot Ortho Device/Splint Location: rle Ortho Device/Splint Interventions: Application   Nikki Dom 04/13/2013, 5:46 PM

## 2013-04-13 NOTE — Transfer of Care (Signed)
Immediate Anesthesia Transfer of Care Note  Patient: Donald Gray  Procedure(s) Performed: Procedure(s) with comments: Right Great Toe Cheilectomy, Debride Tendon and Abscess, Apply Stimulan Beads (Right) - Right Great Toe Cheilectomy, Debride Tendon and Abscess, Apply Stimulan Beads  Patient Location: PACU  Anesthesia Type:General  Level of Consciousness: awake, alert , oriented and patient cooperative  Airway & Oxygen Therapy: Patient Spontanous Breathing  Post-op Assessment: Report given to PACU RN and Post -op Vital signs reviewed and stable  Post vital signs: Reviewed and stable  Complications: No apparent anesthesia complications

## 2013-04-14 LAB — GLUCOSE, CAPILLARY
Glucose-Capillary: 92 mg/dL (ref 70–99)
Glucose-Capillary: 109 mg/dL — ABNORMAL HIGH (ref 70–99)

## 2013-04-14 NOTE — Evaluation (Signed)
Physical Therapy Evaluation Patient Details Name: Donald Gray MRN: [id number] DOB: 1941-04-26 Today's Date: 04/14/2013 Time: 4098-1191 PT Time Calculation (min): 23 min  PT Assessment / Plan / Recommendation History of Present Illness  Pt underwent I&D, tendon repair and antibiotic bead placement R foot due abscess.  Clinical Impression  Patient is s/p I&D R foot surgery resulting in functional limitations due to the deficits listed below (see PT Problem List).  Patient will benefit from skilled PT to increase their independence and safety with mobility to allow discharge to the venue listed below.       PT Assessment  Patient needs continued PT services    Follow Up Recommendations  No PT follow up    Does the patient have the potential to tolerate intense rehabilitation      Barriers to Discharge        Equipment Recommendations  None recommended by PT    Recommendations for Other Services     Frequency Min 3X/week    Precautions / Restrictions Restrictions RLE Weight Bearing: Touchdown weight bearing Other Position/Activity Restrictions: post op shoe   Pertinent Vitals/Pain 6/10      Mobility  Bed Mobility Bed Mobility: Supine to Sit Supine to Sit: 6: Modified independent (Device/Increase time) Transfers Transfers: Sit to Stand;Stand to Sit Sit to Stand: 4: Min guard;From bed;With upper extremity assist Stand to Sit: 5: Supervision;To chair/3-in-1;With upper extremity assist;With armrests Ambulation/Gait Ambulation/Gait Assistance: 4: Min guard Ambulation Distance (Feet): 25 Feet Assistive device: Rolling walker Gait Pattern: Step-to pattern;Antalgic Gait velocity: decreased    Exercises     PT Diagnosis: Difficulty walking;Acute pain  PT Problem List: Decreased activity tolerance;Decreased mobility;Decreased knowledge of precautions;Pain PT Treatment Interventions: DME instruction;Gait training;Stair training;Functional mobility  training;Therapeutic activities;Patient/family education     PT Goals(Current goals can be found in the care plan section) Acute Rehab PT Goals Patient Stated Goal: home PT Goal Formulation: With patient Time For Goal Achievement: 04/21/13 Potential to Achieve Goals: Good  Visit Information  Last PT Received On: 04/14/13 Assistance Needed: +1 History of Present Illness: Pt underwent I&D, tendon repair and antibiotic bead placement R foot due abscess.       Prior Functioning  Home Living Family/patient expects to be discharged to:: Private residence Living Arrangements: Spouse/significant other Available Help at Discharge: Family;Available 24 hours/day Type of Home: House Home Access: Stairs to enter [employer] of Steps: 2 Entrance Stairs-Rails: None Home Layout: Two level;Able to live on main level with bedroom/bathroom Alternate Level Stairs-Rails: Right Home Equipment: Walker - 2 wheels;Bedside commode;Shower seat;Cane - single point Prior Function Level of Independence: Independent with assistive device(s) Communication Communication: No difficulties    Cognition  Cognition Arousal/Alertness: Awake/alert Behavior During Therapy: WFL for tasks assessed/performed Overall Cognitive Status: Within Functional Limits for tasks assessed    Extremity/Trunk Assessment     Balance    End of Session PT - End of Session Equipment Utilized During Treatment: Gait belt Activity Tolerance: Patient tolerated treatment well Patient left: in chair;with call bell/phone within reach Nurse Communication: Mobility status  GP     Ilda Foil 04/14/2013, 12:13 PM  Aida Raider, PT  Office # [phone] Pager [phone]

## 2013-04-14 NOTE — Progress Notes (Signed)
Subjective: 1 Day Post-Op Procedure(s) (LRB): Right Great Toe Cheilectomy, Debride Tendon and Abscess, Apply Stimulan Beads (Right) Patient reports pain as mild.  No complaints.  Objective: Vital signs in last 24 hours: Temp:  [96.1 F (35.6 C)-98.4 F (36.9 C)] 98.3 F (36.8 C) (12/13 0423) Pulse Rate:  [75-90] 80 (12/13 0423) Resp:  [12-19] 18 (12/13 0423) BP: (94-134)/(52-85) 94/52 mmHg (12/13 0423) SpO2:  [90 %-100 %] 99 % (12/13 0423) Weight:  [96.3 kg (212 lb 4.9 oz)] 96.3 kg (212 lb 4.9 oz) (12/12 1811)  Intake/Output from previous day: 12/12 0701 - 12/13 0700 In: 900 [I.V.:900] Out: 20 [Blood:20] Intake/Output this shift: Total I/O In: 420 [P.O.:420] Out: -    Recent Labs  04/12/13 1148  HGB 14.0    Recent Labs  04/12/13 1148  WBC 4.7  RBC 4.82  HCT 40.5  PLT 203    Recent Labs  04/12/13 1148  NA 134*  K 4.7  CL 100  CO2 23  BUN 27*  CREATININE 1.24  GLUCOSE 136*  CALCIUM 9.6    Recent Labs  04/12/13 1148  INR 0.90    Neurovascular intact Incision: moderate drainage and dressing intact moderate dried blood no evidence of active bleeding Compartment soft   Assessment/Plan: 1 Day Post-Op Procedure(s) (LRB): Right Great Toe Cheilectomy, Debride Tendon and Abscess, Apply Stimulan Beads (Right) Up with therapy KVO IV fluids Continue IV and Po antibx  Bryer Cozzolino 04/14/2013, 10:53 AM

## 2013-04-14 NOTE — Progress Notes (Deleted)
Subjective: 1 Day Post-Op Procedure(s) (LRB): Right Great Toe Cheilectomy, Debride Tendon and Abscess, Apply Stimulan Beads (Right) Patient reports pain as moderate.    Objective: Vital signs in last 24 hours: Temp:  [96.1 F (35.6 C)-98.4 F (36.9 C)] 98.3 F (36.8 C) (12/13 0423) Pulse Rate:  [75-90] 80 (12/13 0423) Resp:  [12-19] 18 (12/13 0423) BP: (94-134)/(52-85) 94/52 mmHg (12/13 0423) SpO2:  [90 %-100 %] 99 % (12/13 0423) Weight:  [96.3 kg (212 lb 4.9 oz)] 96.3 kg (212 lb 4.9 oz) (12/12 1811)  Intake/Output from previous day: 12/12 0701 - 12/13 0700 In: 900 [I.V.:900] Out: 20 [Blood:20] Intake/Output this shift: Total I/O In: 420 [P.O.:420] Out: -    Recent Labs  04/12/13 1148  HGB 14.0    Recent Labs  04/12/13 1148  WBC 4.7  RBC 4.82  HCT 40.5  PLT 203    Recent Labs  04/12/13 1148  NA 134*  K 4.7  CL 100  CO2 23  BUN 27*  CREATININE 1.24  GLUCOSE 136*  CALCIUM 9.6    Recent Labs  04/12/13 1148  INR 0.90    Sensation intact distally Intact pulses distally Dorsiflexion/Plantar flexion intact Incision: scant drainage  Assessment/Plan: 1 Day Post-Op Procedure(s) (LRB): Right Great Toe Cheilectomy, Debride Tendon and Abscess, Apply Stimulan Beads (Right) Plan for discharge tomorrow Reports too much pain to go home today.  Inga Noller Y 04/14/2013, 10:49 AM

## 2013-04-15 LAB — GLUCOSE, CAPILLARY
Glucose-Capillary: 100 mg/dL — ABNORMAL HIGH (ref 70–99)
Glucose-Capillary: 91 mg/dL (ref 70–99)

## 2013-04-15 NOTE — Progress Notes (Signed)
Subjective: 2 Days Post-Op Procedure(s) (LRB): Right Great Toe Cheilectomy, Debride Tendon and Abscess, Apply Stimulan Beads (Right) Patient reports pain as mild.    Objective: Vital signs in last 24 hours: Temp:  [97.3 F (36.3 C)-98.3 F (36.8 C)] 97.3 F (36.3 C) (12/14 0603) Pulse Rate:  [73-86] 73 (12/14 0603) Resp:  [17-18] 17 (12/14 0603) BP: (97-133)/(60-65) 108/65 mmHg (12/14 0603) SpO2:  [96 %-100 %] 98 % (12/14 0603)  Intake/Output from previous day: 12/13 0701 - 12/14 0700 In: 1530 [P.O.:1380; IV Piggyback:150] Out: -  Intake/Output this shift:     Recent Labs  04/12/13 1148  HGB 14.0    Recent Labs  04/12/13 1148  WBC 4.7  RBC 4.82  HCT 40.5  PLT 203    Recent Labs  04/12/13 1148  NA 134*  K 4.7  CL 100  CO2 23  BUN 27*  CREATININE 1.24  GLUCOSE 136*  CALCIUM 9.6    Recent Labs  04/12/13 1148  INR 0.90    Incision: scant drainage Toes well perfused  Assessment/Plan: 2 Days Post-Op Procedure(s) (LRB): Right Great Toe Cheilectomy, Debride Tendon and Abscess, Apply Stimulan Beads (Right) Continue IV antibiotics today Possible D/C tomorrow  Doneen Poisson Y 04/15/2013, 8:51 AM

## 2013-04-16 LAB — GLUCOSE, CAPILLARY: Glucose-Capillary: 104 mg/dL — ABNORMAL HIGH (ref 70–99)

## 2013-04-16 MED ORDER — OXYCODONE-ACETAMINOPHEN 5-325 MG PO TABS: 1.0000 | ORAL_TABLET | ORAL | Status: DC | PRN

## 2013-04-16 NOTE — Discharge Summary (Signed)
Physician Discharge Summary  Patient ID: Donald Gray MRN: [id number] DOB/AGE: Feb 27, 1941 72 y.o.  Admit date: 04/13/2013 Discharge date: 04/16/2013  Admission Diagnoses: Hallux rigidus infection EHL rupture right great toe  Discharge Diagnoses: Hallux rigidus infection EHL rupture of right great toe Active Problems:   Abscess, toe   Discharged Condition: stable  Hospital Course: Patient's hospital course was essentially unremarkable. He underwent chilectomy debridement of the infection repair the EHL tendon and placement of antibiotic beads. Postoperatively he progressed well and was discharged to home in stable condition.  Consults: None  Significant Diagnostic Studies: labs: Routine labs  Treatments: surgery: See operative note  Discharge Exam: Blood pressure 109/65, pulse 78, temperature 97.5 F (36.4 C), temperature source Oral, resp. rate 18, height 5\' 11"  (1.803 m), weight 96.3 kg (212 lb 4.9 oz), SpO2 98.00%. Incision/Wound: dressing clean dry and intact  Disposition: 06-Home-Health Care Svc   Future Appointments Provider Department Dept Phone   04/24/2013 10:00 AM Alvan Dame, DPM [facility] [phone]   05/04/2013 9:00 AM Glendale Chard, MD [facility] [place] [phone]       Medication List    ASK your doctor about these medications       aspirin 325 MG EC tablet  Take 325 mg by mouth daily.     atorvastatin 10 MG tablet  Commonly known as:  LIPITOR  Take 10 mg by mouth daily with breakfast. Patient is presently holding this medication til the second week in January per doctor instructions.     azelastine 137 MCG/SPRAY nasal spray  Commonly known as:  ASTELIN  Place 1 spray into the nose 2 (two) times daily as needed. For congestion; Use in each nostril as directed     bisacodyl 5 MG EC tablet  Commonly known as:  DULCOLAX  Take 2 tablets every night with dinner until bowel movement.  LAXITIVE.  Restart if two  days since last bowel movement     cetirizine 10 MG tablet  Commonly known as:  ZYRTEC  Take 10 mg by mouth daily. Pt takes zyrtec for 30 days then allegra for 30 days , pt alternates     cholecalciferol 1000 UNITS tablet  Commonly known as:  VITAMIN D  Take 1,000 Units by mouth daily.     clindamycin 300 MG capsule  Commonly known as:  CLEOCIN  Take 300 mg by mouth 3 (three) times daily.     EPINEPHrine 0.3 mg/0.3 mL Soaj injection  Commonly known as:  EPI-PEN  Inject 0.3 mg into the muscle once.     fexofenadine 180 MG tablet  Commonly known as:  ALLEGRA  Take 180 mg by mouth daily with breakfast. Pt takes allegra for 30 days then zyrtec for 30 days     fluticasone 50 MCG/ACT nasal spray  Commonly known as:  FLONASE  Place 2 sprays into the nose daily as needed for allergies. Do not use while on "IL2"     HYDROcodone-acetaminophen 5-325 MG per tablet  Commonly known as:  NORCO/VICODIN  Take 1 tablet by mouth every 6 (six) hours as needed for moderate pain.     lisinopril 20 MG tablet  Commonly known as:  PRINIVIL,ZESTRIL  Take 20 mg by mouth daily. For raised BP while on "IL2", if b/p low per pt only takes one per day per MD     magnesium gluconate 500 MG tablet  Commonly known as:  MAGONATE  Take 500 mg by mouth 2 (two) times  daily.     multivitamin with minerals Tabs tablet  Take 1 tablet by mouth daily.     omeprazole 20 MG capsule  Commonly known as:  PRILOSEC  Take 20 mg by mouth daily.     ranitidine 75 MG tablet  Commonly known as:  ZANTAC  Take 75 mg by mouth daily as needed for heartburn.     vitamin C 500 MG tablet  Commonly known as:  ASCORBIC ACID  Take 500 mg by mouth daily.           Follow-up Information   Follow up with Adaora Mchaney V, MD In 1 week.   Specialty:  Orthopedic Surgery   Contact information:   [address] [place] [address] [phone]       Signed: Nadara Mustard 04/16/2013, 6:43 AM

## 2013-04-16 NOTE — Care Management Note (Signed)
CARE MANAGEMENT NOTE 04/16/2013  Patient:  Donald Gray, Donald Gray   Account Number:  [id number]  Date Initiated:  04/16/2013  Documentation initiated by:  Vance Peper  Subjective/Objective Assessment:   72 yr old male s/p right great toe cheilectomy , I&D of abscess.     Action/Plan:   No HH needs identified. Patient has own rolling walker.   Anticipated DC Date:  04/16/2013   Anticipated DC Plan:  HOME/SELF CARE      DC Planning Services  CM consult      PAC Choice  NA   Choice offered to / List presented to:          [facility] arranged  NA      HH agency  NA   Status of service:  Completed, signed off Medicare Important Message given?   (If response is "NO", the following Medicare IM given date fields will be blank) Date Medicare IM given:   Date Additional Medicare IM given:    Discharge Disposition:  HOME/SELF CARE

## 2013-04-16 NOTE — Progress Notes (Signed)
Physical Therapy Treatment Patient Details Name: Donald Gray MRN: [id number] DOB: [birth year] Today's Date: 04/16/2013 Time: 7829-5621 PT Time Calculation (min): 23 min  PT Assessment / Plan / Recommendation  History of Present Illness Pt underwent I&D, tendon repair and antibiotic bead placement R foot due abscess.   PT Comments   Session focused on stair training today.  Pt requesting to use crutches due to he is familiar with them as he used them in July when he had LTKA.  Pt requires cueing to reinforce WBing restriction as he appears to be putting more than TDWBing through RLE with activity.     Follow Up Recommendations  No PT follow up     Does the patient have the potential to tolerate intense rehabilitation     Barriers to Discharge        Equipment Recommendations  None recommended by PT    Recommendations for Other Services    Frequency Min 3X/week   Progress towards PT Goals Progress towards PT goals: Progressing toward goals  Plan Current plan remains appropriate    Precautions / Restrictions Restrictions RLE Weight Bearing: Touchdown weight bearing       Mobility  Bed Mobility Bed Mobility: Supine to Sit;Sitting - Scoot to Edge of Bed;Sit to Supine Supine to Sit: 6: Modified independent (Device/Increase time);HOB flat Sitting - Scoot to Edge of Bed: 6: Modified independent (Device/Increase time) Sit to Supine: 6: Modified independent (Device/Increase time);HOB flat Transfers Transfers: Sit to Stand;Stand to Sit Sit to Stand: 4: Min guard;With upper extremity assist;With armrests;From bed;From chair/3-in-1 Stand to Sit: 4: Min guard;With upper extremity assist;With armrests;To bed;To chair/3-in-1 Details for Transfer Assistance: cues to reinforce TDWBing RLE, safe use of crutches with sit<>stand, & safest hand placement Ambulation/Gait Ambulation/Gait Assistance: 4: Min guard Ambulation Distance (Feet): 30 Feet (20' with RW + 10' with  crutches) Assistive device: Rolling walker;Crutches Ambulation/Gait Assistance Details: Ambulated from bed into hallway with RW with cues to reinforce TDWBing however pt appears to be bearing more weight thru RLE than supposed to, then once in ortho gym pt states he would like to use crutches for stair negotiation.  He states he used crutches in July when he had a LTKA.  Pt steady on crutches but still requires cues to reinforce TDWBing.   Gait Pattern: Step-to pattern Gait velocity: decreased General Gait Details: Appears to be putting more than TDWBing RLE Stairs: Yes Stairs Assistance: 4: Min guard Stairs Assistance Details (indicate cue type and reason): cues for sequencing, technique, & to reinforce TDWBing.  Pt appears to be placing more weight than TDWBing Stair Management Technique: Step to pattern;Forwards;With crutches Number of Stairs: 5 Wheelchair Mobility Wheelchair Mobility: No      PT Goals (current goals can now be found in the care plan section) Acute Rehab PT Goals Patient Stated Goal: home PT Goal Formulation: With patient Time For Goal Achievement: 04/21/13 Potential to Achieve Goals: Good  Visit Information  Last PT Received On: 04/16/13 Assistance Needed: +1 History of Present Illness: Pt underwent I&D, tendon repair and antibiotic bead placement R foot due abscess.    Subjective Data  Patient Stated Goal: home   Cognition  Cognition Arousal/Alertness: Awake/alert Behavior During Therapy: WFL for tasks assessed/performed Overall Cognitive Status: Within Functional Limits for tasks assessed    Balance     End of Session PT - End of Session Equipment Utilized During Treatment: Gait belt Activity Tolerance: Patient tolerated treatment well Patient left: with call bell/phone within reach;in bed  Nurse Communication: Mobility status   GP     Lara Mulch 04/16/2013, 2:38 PM   Verdell Face, [place] [phone] 04/16/2013

## 2013-04-16 NOTE — Progress Notes (Signed)
Utilization review completed.  

## 2013-04-17 ENCOUNTER — Encounter (HOSPITAL_COMMUNITY): Payer: Self-pay | Admitting: Orthopedic Surgery

## 2013-04-24 ENCOUNTER — Ambulatory Visit: Payer: Self-pay

## 2013-05-04 ENCOUNTER — Encounter: Payer: Self-pay | Admitting: Neurology

## 2013-05-04 ENCOUNTER — Ambulatory Visit (INDEPENDENT_AMBULATORY_CARE_PROVIDER_SITE_OTHER): Payer: Medicare PPO | Admitting: Neurology

## 2013-05-04 VITALS — BP 138/68 | HR 104 | Temp 97.7°F | Ht 71.0 in | Wt 207.5 lb

## 2013-05-04 DIAGNOSIS — R209 Unspecified disturbances of skin sensation: Secondary | ICD-10-CM

## 2013-05-04 DIAGNOSIS — M2140 Flat foot [pes planus] (acquired), unspecified foot: Secondary | ICD-10-CM

## 2013-05-04 DIAGNOSIS — R29898 Other symptoms and signs involving the musculoskeletal system: Secondary | ICD-10-CM

## 2013-05-04 LAB — TSH: TSH: 0.38 u[IU]/mL (ref 0.35–5.50)

## 2013-05-04 LAB — VITAMIN B12: Vitamin B-12: 1277 pg/mL — ABNORMAL HIGH (ref 211–911)

## 2013-05-04 NOTE — Patient Instructions (Signed)
1.  Check TSH, vitamin B12, vitamin B6, copper, SPEP/UPEP 2.  EMG of the left leg 3.  Return to clinic in 22-month

## 2013-05-04 NOTE — Progress Notes (Signed)
[facility] Clinic Note - Initial Visit   Date: 05/04/2013    Donald Gray MRN: [id number] DOB: [birth year]   Dear Dr Laurann Montana:  Thank you for your kind referral of Donald Gray for consultation of paresthesias. Although his history is well known to you, please allow [place] to reiterate it for the purpose of our medical record. The patient was accompanied to the clinic by self.   History of Present Illness: Donald Gray is a 73 y.o. left-handed Caucasian male with history of hypertension, hyperlipidemia, renal cell carcinoma in remission (completed immunotherapy clinical trial 2011), CAD s/p stent (1995), GERD, diabetes mellitus (diet-controlled) presenting for evaluation of numbness of his feet.  Around 2009, he developed numbness of the soles of his feet which has slowly progressed to involve his ankles.  Symptoms are constant.  Denies any alleviating or exacerbating factors.  In April 2014, Dr. Laroy Apple (non-surgical orthopaedics physician) performed an EMG which per patient's report, was unremakable.  Over the past 47-months, his symptoms worsened because he started noticing that his balance was unsteady.  He has not had any falls.  Around the same time, he developed left lateral knee achy pain which would radiate into the side of his calf.  He also had associated muscle cramps, which resolved.   He reports noticing numbness over the tip of the left index finger which is also constant.  No associated tingling or burning pain.  He tried Lyrica which did not help.  There was also concern that his lipitor may be contributing to symptoms so this was stopped in April, but there has been no change in symptoms.    He has intermittent "hurt" of his low back.  He has previously seen a chiropractor and used a TENS unit.    Denies dry eyes, dry mouth, early satiety, bowel/bladder symptoms, myalgias, or weakness.    Of note, he underwent right foot  surgery in December 2014 for abscess and cellulitis.  Out-side paper records, electronic medical record, and images have been reviewed where available and summarized as:  Lab Results  Component Value Date   HGBA1C 6.4* 11/13/2012     Past Medical History  Diagnosis Date  . Hypertension   . Cancer     pt with left kidney ca with resultant nephrectomy and mets to the lungs.  . Coronary artery disease   . Hyperlipidemia   . Heart attack     1995   . Shortness of breath     related to sutent and anti PD-1( anticancer drugs )   . Anemia     at time of cancer   . Blood transfusion     last one 2011   . GERD (gastroesophageal reflux disease)   . Arthritis     knees   . Diverticulosis   . H/O interleukin-2 treatment     2012 - for tx of metastatic renal cell carcinoma   . Diabetes     hx of pt not on meds at this time   . Hearing loss     hearing aids   . Renal disorder     hx of kidney cancer , CKD III  . Left knee DJD   . Dysrhythmia     atrial fib hx of n   hank smith  . Medical history non-contributory     Past Surgical History  Procedure Laterality Date  . Nephrectomy    . Cardiac surgery    . Knee  surgery    . Pilonidal cyst drainage    . Shoulder surgery  1997    right shoulder  . Kidney surgery  12/14/2009    removed right kidney  . Joint replacement      right shoulder replacement   . Tonsillectomy      and adenoidectomy   . Cardiac catheterization      05/2009   . Coronary angioplasty with stent placement      1997 at [facility] to RCA   . Inguinal hernia repair  09/22/2011    Procedure: HERNIA REPAIR INGUINAL ADULT BILATERAL;  Surgeon: Imogene Burn. Georgette Dover, MD;  Location: [facility];  Service: General;  Laterality: Bilateral;  . Orchiectomy  09/22/2011    Procedure: ORCHIECTOMY;  Surgeon: Franchot Gallo, MD;  Location: [facility];  Service: Urology;  Laterality: Left;  Left Testicular Exploration, Inguinal Approach,  Testicular Orchiectomy,   . Hernia repair  09/22/11      BIH  . Total knee arthroplasty Left 11/13/2012    Dr Noemi Chapel  . Knee arthroscopy Left 11/13/2012    Procedure: ARTHROSCOPY KNEE;  Surgeon: Lorn Junes, MD;  Location: [place];  Service: Orthopedics;  Laterality: Left;  . Total knee arthroplasty Left 11/13/2012    Procedure: TOTAL KNEE ARTHROPLASTY;  Surgeon: Lorn Junes, MD;  Location: [place];  Service: Orthopedics;  Laterality: Left;  . Cheilectomy Right 04/13/2013    Procedure: Right Great Toe Cheilectomy, Debride Tendon and Abscess, Apply Stimulan Beads;  Surgeon: Newt Minion, MD;  Location: [facility];  Service: Orthopedics;  Laterality: Right;  Right Great Toe Cheilectomy, Debride Tendon and Abscess, Apply Stimulan Beads     Medications:  Current Outpatient Prescriptions on File Prior to Visit  Medication Sig Dispense Refill  . aspirin 325 MG EC tablet Take 325 mg by mouth daily.      Marland Kitchen azelastine (ASTELIN) 137 MCG/SPRAY nasal spray Place 1 spray into the nose 2 (two) times daily as needed. For congestion; Use in each nostril as directed      . cetirizine (ZYRTEC) 10 MG tablet Take 10 mg by mouth daily. Pt takes zyrtec for 30 days then allegra for 30 days , pt alternates      . cholecalciferol (VITAMIN D) 1000 UNITS tablet Take 1,000 Units by mouth daily.      . fluticasone (FLONASE) 50 MCG/ACT nasal spray Place 2 sprays into the nose daily as needed for allergies. Do not use while on "IL2"      . lisinopril (PRINIVIL,ZESTRIL) 20 MG tablet Take 20 mg by mouth daily. For raised BP while on "IL2", if b/p low per pt only takes one per day per MD      . magnesium gluconate (MAGONATE) 500 MG tablet Take 500 mg by mouth 2 (two) times daily.       . Multiple Vitamin (MULTIVITAMIN WITH MINERALS) TABS Take 1 tablet by mouth daily.      Marland Kitchen omeprazole (PRILOSEC) 20 MG capsule Take 20 mg by mouth daily.      . vitamin C (ASCORBIC ACID) 500 MG tablet Take 500 mg by mouth daily.       Marland Kitchen atorvastatin (LIPITOR) 10 MG tablet Take 10 mg by mouth daily  with breakfast. Patient is presently holding this medication til the second week in January per doctor instructions.      Marland Kitchen EPINEPHrine (EPI-PEN) 0.3 mg/0.3 mL SOAJ Inject 0.3 mg into the muscle once.      . fexofenadine (ALLEGRA) 180  MG tablet Take 180 mg by mouth daily with breakfast. Pt takes allegra for 30 days then zyrtec for 30 days      . ranitidine (ZANTAC) 75 MG tablet Take 75 mg by mouth daily as needed for heartburn.       No current facility-administered medications on file prior to visit.    Allergies:  Allergies  Allergen Reactions  . Penicillins Swelling  . Ambien [Zolpidem Tartrate] Other (See Comments)    Causes hallucinations.     Family History: Family History  Problem Relation Age of Onset  . Heart disease Father     Social History: History   Social History  . Marital Status: Married    Spouse Name: N/A    Number of Children: N/A  . Years of Education: N/A   Occupational History  . Not on file.   Social History Main Topics  . Smoking status: Former Smoker -- 1.00 packs/day for 2 years    Types: Cigarettes    Quit date: 05/03/1961  . Smokeless tobacco: Never Used  . Alcohol Use: Yes     Comment: occasional glass of wine   . Drug Use: No  . Sexual Activity: No   Other Topics Concern  . Not on file   Social History Narrative  . No narrative on file    Review of Systems:  CONSTITUTIONAL: No fevers, chills, night sweats, or weight loss.   EYES: No visual changes or eye pain ENT: No hearing changes.  No history of nose bleeds.   RESPIRATORY: No cough, wheezing and shortness of breath.   CARDIOVASCULAR: Negative for chest pain, and palpitations.   GI: Negative for abdominal discomfort, blood in stools or black stools.  No recent change in bowel habits.   GU:  No history of incontinence.   MUSCLOSKELETAL: No history of joint pain or swelling.  No myalgias.   SKIN: Negative for lesions, rash, and itching.   HEMATOLOGY/ONCOLOGY: Negative for  prolonged bleeding, bruising easily, and swollen nodes.  +history of cancer.   ENDOCRINE: Negative for cold or heat intolerance, polydipsia or goiter.   PSYCH:  No depression or anxiety symptoms.   NEURO: As Above.   Vital Signs:  BP 138/68  Pulse 104  Temp(Src) 97.7 F (36.5 C) (Oral)  Ht 5\' 11"  (1.803 m)  Wt 207 lb 8 oz (94.121 kg)  BMI 28.95 kg/m2   General Medical Exam:   General:  Well appearing, comfortable.   Eyes/ENT: see cranial nerve examination.   Neck: No masses appreciated.  Full range of motion without tenderness.   Respiratory:  Clear to auscultation, good air entry bilaterally.   Cardiac:  Regular rate and rhythm, no murmur.   GI:  Soft, non-tender, non-distended abdomen.  Bowel sounds normal. No masses, organomegaly.   Back:  No pain to palpation of spinous processes.   Extremities:  No deformities, edema, or skin discoloration. Good capillary refill.   Skin:  Left foot with erythema and mild edema over surgical scar.  Incision is clean, dry, and intact  Neurological Exam: MENTAL STATUS including orientation to time, place, person, recent and remote memory, attention span and concentration, language, and fund of knowledge is normal.  Speech is not dysarthric.  CRANIAL NERVES: II:  No visual field defects.  Unremarkable fundi.   III-IV-VI: Pupils equal round and reactive to light.  Normal conjugate, extra-ocular eye movements in all directions of gaze.  No nystagmus.  No ptosis.   V:  Normal facial sensation.  Marland Kitchen  VII:  Normal facial symmetry and movements.   VIII:  Normal hearing and vestibular function.   IX-X:  Normal palatal movement.   XI:  Normal shoulder shrug and head rotation.   XII:  Normal tongue strength and range of motion, no deviation or fasciculation.  MOTOR:  No atrophy, fasciculations or abnormal movements.  No pronator drift.  Tone is normal.    Right Upper Extremity:    Left Upper Extremity:    Deltoid  5/5   Deltoid  5/5   Biceps  5/5    Biceps  5/5   Triceps  5/5   Triceps  5/5   Wrist extensors  5/5   Wrist extensors  5/5   Wrist flexors  5/5   Wrist flexors  5/5   Finger extensors  5/5   Finger extensors  5/5   Finger flexors  5/5   Finger flexors  5/5   Dorsal interossei  5/5   Dorsal interossei  5/5   Abductor pollicis  5/5   Abductor pollicis  5/5   Tone (Ashworth scale)  0  Tone (Ashworth scale)  0   Right Lower Extremity:    Left Lower Extremity:    Hip flexors  5/5   Hip flexors  5/5   Hip extensors  5/5   Hip extensors  5/5   Knee flexors  5/5   Knee flexors  5/5   Knee extensors  5/5   Knee extensors  5/5   Dorsiflexors  5-/5   Dorsiflexors  5-/5   Plantarflexors  5/5   Plantarflexors  5/5   Inversion 5-/5  Inversion 5-/5  Eversion 5-/5  Eversion 5-/5  Toe extensors  5-/5   Toe extensors  4+/5   Toe flexors  5/5   Toe flexors  5/5   Tone (Ashworth scale)  0  Tone (Ashworth scale)  0   MSRs:  Right                                                                 Left brachioradialis 1+  brachioradialis 1+  biceps 1+  biceps 1+  triceps 1+  triceps 1+  patellar 1+  patellar 1+  ankle jerk 0  ankle jerk 0  Hoffman no  Hoffman no  plantar response mute  plantar response mute   SENSORY:  Reduced vibration to 80% at the ankles and great toe bilaterally.  Normal and symmetric perception of light touch, pinprick, and proprioception.  Romberg's sign absent.   COORDINATION/GAIT: Normal finger-to- nose-finger and heel-to-shin.  Intact rapid alternating movements bilaterally.  Able to rise from a chair without using arms.  Gait narrow based and stable. He is unsteady with tandem gait and unable to perform stressed gait.   IMPRESSION: Mr. Gratz is a 73 year-old gentleman presenting for evaluation of paresthesias of his feet.  On exam, there is diminished vibration distal to ankles, absent ankle jerks, and weakness in inversion, eversion, dorsiflexion, and toe extension (left > right), with preserved strength  of plantar flexion and toe flexion.  Based on his exam and history, he may have a mild distal predominant large fiber neuropathy and L5-radiculopathy.  I would like to obtain and EMG to better localize his symptoms.  Additionally, labs for treatable  causes of neuropathy will be checked. I explained that diabetes is the most common cause of neuropathy and that the natural course of neuropathy is that it is slowly progressive.  To prevent further progression, tight glycemic control is recommended. Fortunately, his diabetes has been well-managed by diet alone.  It would be reasonable to restart his lipitor since an adequate trial off medications has not changed his symptoms and my clinical suspicion for statin-induced myopathy or neuropathy is low.   PLAN/RECOMMENDATIONS:  1.  Check TSH, vitamin B12, vitamin B6, copper, SPEP/UPEP with IFE 2.  EMG of the left leg 3.  Return to clinic in 32-month  The duration of this appointment visit was 45 minutes of face-to-face time with the patient.  Greater than 50% of this time was spent in counseling, explanation of diagnosis, planning of further management, and coordination of care.   Thank you for allowing me to participate in patient's care.  If I can answer any additional questions, I would be pleased to do so.    Sincerely,    Donika K. Posey Pronto, DO

## 2013-05-06 LAB — COPPER, SERUM: Copper: 93 ug/dL (ref 70–175)

## 2013-05-07 NOTE — Progress Notes (Signed)
faxed

## 2013-05-08 LAB — PROTEIN ELECTROPHORESIS, SERUM
Albumin ELP: 59.9 % (ref 55.8–66.1)
Alpha-1-Globulin: 8.1 % — ABNORMAL HIGH (ref 2.9–4.9)
Alpha-2-Globulin: 10.6 % (ref 7.1–11.8)
BETA GLOBULIN: 6.2 % (ref 4.7–7.2)
Beta 2: 3 % — ABNORMAL LOW (ref 3.2–6.5)
GAMMA GLOBULIN: 12.2 % (ref 11.1–18.8)
TOTAL PROTEIN, SERUM ELECTROPHOR: 6.8 g/dL (ref 6.0–8.3)

## 2013-05-08 LAB — UIFE/LIGHT CHAINS/TP QN, 24-HR UR
ALPHA 2 UR: DETECTED — AB
Albumin, U: DETECTED
Alpha 1, Urine: DETECTED — AB
BETA UR: DETECTED — AB
Free Kappa Lt Chains,Ur: 9.94 mg/dL — ABNORMAL HIGH (ref 0.14–2.42)
Free Kappa/Lambda Ratio: 15.78 ratio — ABNORMAL HIGH (ref 2.04–10.37)
Free Lambda Lt Chains,Ur: 0.63 mg/dL (ref 0.02–0.67)
GAMMA UR: DETECTED — AB
Total Protein, Urine: 14.7 mg/dL

## 2013-05-09 LAB — VITAMIN B6: Vitamin B6: 32.3 ng/mL — ABNORMAL HIGH (ref 2.1–21.7)

## 2013-05-10 ENCOUNTER — Encounter: Payer: Self-pay | Admitting: Neurology

## 2013-05-10 ENCOUNTER — Ambulatory Visit (INDEPENDENT_AMBULATORY_CARE_PROVIDER_SITE_OTHER): Payer: Medicare PPO | Admitting: Neurology

## 2013-05-10 DIAGNOSIS — G608 Other hereditary and idiopathic neuropathies: Principal | ICD-10-CM

## 2013-05-10 DIAGNOSIS — G5730 Lesion of lateral popliteal nerve, unspecified lower limb: Secondary | ICD-10-CM

## 2013-05-10 DIAGNOSIS — G589 Mononeuropathy, unspecified: Secondary | ICD-10-CM

## 2013-05-10 DIAGNOSIS — IMO0002 Reserved for concepts with insufficient information to code with codable children: Secondary | ICD-10-CM

## 2013-05-10 DIAGNOSIS — G6289 Other specified polyneuropathies: Secondary | ICD-10-CM

## 2013-05-10 DIAGNOSIS — G5732 Lesion of lateral popliteal nerve, left lower limb: Secondary | ICD-10-CM

## 2013-05-10 DIAGNOSIS — M5417 Radiculopathy, lumbosacral region: Secondary | ICD-10-CM

## 2013-05-10 NOTE — Procedures (Signed)
[facility]  [place], [place]  [place], [place] [address] Tel: [phone] Fax:  [phone] Test Date:  05/10/2013  Patient: Donald Gray DOB: 03/30/1941 Physician: Narda Amber, DO  Sex: Male Height: 5\' 11"  Ref Phys:   ID#: [id number] Temp: 32.6C Technician:    Patient Complaints: This is a 73 year-old gentleman presenting with paresthesias of his feet and sharp pain in his left leg.  NCV & EMG Findings: Extensive evaluation of the left lower extremity and additional studies of the right reveals: 1. Absent sural and superficial peroneal responses bilaterally.  2. The peroneal motor response is absent when recorded at extensor digitorum brevis bilaterally.  Peroneal motor responses recorded at the tibialis anterior are reduced bilaterally.  However, on the left leg, there is a peroneal motor amplitude reduction of 69% below the fibular head and at the popliteal fossa consistent with a conduction block between these sites.   3. On needle electrode examination there is chronic motor axon loss changes affecting all the muscles below the knee with milder changes noted in the gluteus medius, rectus femoris, and abductor longus.  Similar findings were seen on the right lower extremity. There is no evidence of active or ongoing denervation.   Impression: 1. There is electrophysiological evidence of a chronic sensoriomotor length-dependent, predominately axonal polyneuropathy affecting bilateral lower extremities; moderate in degree electrically. 2. Superimposed multilevel intraspinal canal lesion (i.e. radiculopathy) affecting L5 > S1 nerve roots/segments cannot be excluded, but it thought to be less likely based on the pattern of axon loss seen on needle electrode examination. 3. Superimposed chronic left common peroneal neuropathy at the fibular head.      ___________________________ Narda Amber, DO    Nerve Conduction Studies Anti Sensory Summary Table   Site NR Peak (ms) Norm Peak (ms) P-T Amp (V) Norm P-T Amp  Left Sup Peroneal Anti Sensory (Ant Lat Mall)  12 cm NR  <4.6  >3  Right Sup Peroneal Anti Sensory (Ant Lat Mall)  12 cm NR  <4.6  >3  Left Sural Anti Sensory (Lat Mall)  Calf NR  <4.6  >3  Right Sural Anti Sensory (Lat Mall)  Calf NR  <4.6  >3   Motor Summary Table  Site NR Onset (ms) Norm Onset (ms) O-P Amp (mV) Norm O-P Amp Site1 Site2 Delta-0 (ms) Dist (cm) Vel (m/s) Norm Vel (m/s)  Left Peroneal Motor (Ext Dig Brev)  Ankle NR  <6.0  >2.5 B Fib Ankle  0.0  >40  B Fib NR     Poplt B Fib  0.0  >40  Poplt NR            Right Peroneal Motor (Ext Dig Brev)  Ankle NR  <6.0  >2.5 B Fib Ankle  0.0  >40  B Fib NR     Poplt B Fib  0.0  >40  Poplt NR            Left Peroneal TA Motor (Tib Ant)  Fib Head    5.2 <4.5 2.3 >3 Poplit Fib Head 1.8 10.0 56 >40  Poplit    7.0  0.7         Right Peroneal TA Motor (Tib Ant)  Fib Head    4.3 <4.5 2.8 >3 Poplit Fib Head 1.9 10.0 53 >40  Poplit    6.2  2.6         Left Tibial Motor (Abd Hall Brev)  Ankle NR  <6.0  >4 Knee  Ankle  0.0  >40  Knee NR            Right Tibial Motor (Abd Hall Brev)  Ankle    4.5 <6.0 0.8 >4 Knee Ankle 16.0 0.0  >40  Knee    20.5  0.1          H Reflex Studies  NR H-Lat (ms) Lat Norm (ms) L-R H-Lat (ms)  Left Tibial (Gastroc)  NR  <35   Right Tibial (Gastroc)  NR  <35    EMG  Side Muscle Ins Act Fibs Psw Fasc Number Recrt Dur Dur. Amp Amp. Poly Poly. Comment  Right RectFemoris Nml Nml Nml Nml Nml Nml Nml Nml Nml Nml Nml Nml N/A  Right AntTibialis Nml Nml Nml Nml 1- Rapid Some 1+ Some 1+ Nml Nml N/A  Right Gastroc Nml Nml Nml Nml 1- Mod-R Few 1+ Nml Nml Nml Nml N/A  Right Flex Dig Long Nml Nml Nml Nml 1- Rapid Some 1+ Nml Nml Nml Nml N/A  Right BicepsFemS Nml Nml Nml Nml Nml Nml Nml Nml Nml Nml Nml Nml N/A  Right GluteusMed Nml Nml Nml Nml 1- Mod-R Few 1+ Nml Nml Nml Nml N/A  Left AntTibialis Nml Nml Nml Nml 2- Rapid Some 1+ Some 1+ Few 1+ N/A  Left  BicepsFemS Nml Nml Nml Nml Nml Nml Nml Nml Nml Nml Nml Nml N/A  Left Gastroc Nml Nml Nml Nml 2- Rapid Some 1+ Some 1+ Few 1+ N/A  Left Flex Dig Long Nml Nml Nml Nml 2- Rapid Some 1+ Some 1+ Nml Nml N/A  Left ExtHallLong Nml Nml Nml Nml 2- Rapid Some 1+ Some 1+ Nml Nml N/A  Left Fibularis Long Nml Nml Nml Nml 1- Rapid Some 1+ Some 1+ Nml Nml N/A  Left VastusLat Nml Nml Nml Nml 1- Mod Few 1+ Few 1+ Few 1+ N/A  Left GluteusMed Nml Nml Nml Nml 1- Mod Few 1+ Nml Nml Nml Nml N/A      Waveforms:

## 2013-05-10 NOTE — Progress Notes (Signed)
See procedure note for EMG results.  Erica Osuna K. Dorthy Hustead, DO  

## 2013-05-18 ENCOUNTER — Encounter: Payer: Self-pay | Admitting: Neurology

## 2013-05-18 ENCOUNTER — Ambulatory Visit (INDEPENDENT_AMBULATORY_CARE_PROVIDER_SITE_OTHER): Payer: Medicare PPO | Admitting: Neurology

## 2013-05-18 VITALS — BP 120/60 | HR 86 | Ht 71.0 in | Wt 203.0 lb

## 2013-05-18 DIAGNOSIS — G6289 Other specified polyneuropathies: Secondary | ICD-10-CM

## 2013-05-18 DIAGNOSIS — G5732 Lesion of lateral popliteal nerve, left lower limb: Secondary | ICD-10-CM

## 2013-05-18 DIAGNOSIS — G589 Mononeuropathy, unspecified: Secondary | ICD-10-CM

## 2013-05-18 DIAGNOSIS — IMO0002 Reserved for concepts with insufficient information to code with codable children: Secondary | ICD-10-CM

## 2013-05-18 DIAGNOSIS — M5417 Radiculopathy, lumbosacral region: Secondary | ICD-10-CM

## 2013-05-18 DIAGNOSIS — G5730 Lesion of lateral popliteal nerve, unspecified lower limb: Secondary | ICD-10-CM

## 2013-05-18 DIAGNOSIS — G608 Other hereditary and idiopathic neuropathies: Principal | ICD-10-CM

## 2013-05-18 NOTE — Patient Instructions (Addendum)
1.  Fall precautions discussed 2.  Encouraged to avoid trauma, crossing the legs, or compression to the left knee  3.  Return to clinic in 59-months

## 2013-05-18 NOTE — Progress Notes (Signed)
Follow-up Visit   Date: 05/18/2013    Donald Gray MRN: [id number] DOB: 1940/08/12   Interim History: Donald Gray is a 73 y.o. left-handed Caucasian male with history of hypertension, hyperlipidemia, renal cell carcinoma in remission (completed immunotherapy clinical trial 2011), CAD s/p stent (1995), GERD, diabetes mellitus (diet-controlled) returning to the clinic for follow-up of parethesias of his feet.    There has been no interval change in symptoms.  He had EMG which showed peripheral neuropathy and left peroneal mononeuropathy (see below).  He reports that his right foot is healing well after debridement for cellulitis.   History of present illness: Around 2009, he developed numbness of the soles of his feet which has slowly progressed to involve his ankles. Symptoms are constant. Denies any alleviating or exacerbating factors. In April 2014, Dr. Laroy Apple (non-surgical orthopaedics physician) performed an EMG which per patient's report, was unremakable.   Over the past 61-months, his symptoms worsened because he started noticing that his balance was unsteady. He has not had any falls. Around the same time, he developed left lateral knee achy pain which would radiate into the side of his calf. He also had associated muscle cramps, which resolved. He reports noticing numbness over the tip of the left index finger which is also constant. No associated tingling or burning pain. He tried Lyrica which did not help. There was also concern that his lipitor may be contributing to symptoms so this was stopped in April, but there has been no change in symptoms.  He has intermittent "hurt" of his low back. He has previously seen a chiropractor and used a TENS unit.     Medications:  Current Outpatient Prescriptions on File Prior to Visit  Medication Sig Dispense Refill  . aspirin 325 MG EC tablet Take 325 mg by mouth daily.      Marland Kitchen atorvastatin (LIPITOR) 10 MG tablet  Take 10 mg by mouth daily with breakfast. Patient is presently holding this medication til the second week in January per doctor instructions.      Marland Kitchen azelastine (ASTELIN) 137 MCG/SPRAY nasal spray Place 1 spray into the nose 2 (two) times daily as needed. For congestion; Use in each nostril as directed      . cetirizine (ZYRTEC) 10 MG tablet Take 10 mg by mouth daily. Pt takes zyrtec for 30 days then allegra for 30 days , pt alternates      . cholecalciferol (VITAMIN D) 1000 UNITS tablet Take 1,000 Units by mouth daily.      . Coenzyme Q10 (COQ10) 100 MG CAPS Take 100 mg by mouth daily.      . cyanocobalamin 100 MCG tablet Take 100 mcg by mouth daily.      Marland Kitchen EPINEPHrine (EPI-PEN) 0.3 mg/0.3 mL SOAJ Inject 0.3 mg into the muscle once.      . fexofenadine (ALLEGRA) 180 MG tablet Take 180 mg by mouth daily with breakfast. Pt takes allegra for 30 days then zyrtec for 30 days      . fluticasone (FLONASE) 50 MCG/ACT nasal spray Place 2 sprays into the nose daily as needed for allergies. Do not use while on "IL2"      . lisinopril (PRINIVIL,ZESTRIL) 20 MG tablet Take 20 mg by mouth daily. For raised BP while on "IL2", if b/p low per pt only takes one per day per MD      . magnesium gluconate (MAGONATE) 500 MG tablet Take 500 mg by mouth 2 (two) times daily.       Marland Kitchen  Multiple Vitamin (MULTIVITAMIN WITH MINERALS) TABS Take 1 tablet by mouth daily.      Marland Kitchen omeprazole (PRILOSEC) 20 MG capsule Take 20 mg by mouth daily.      . Pyridoxine HCl (VITAMIN B-6 PO) Take by mouth daily.      . ranitidine (ZANTAC) 75 MG tablet Take 75 mg by mouth daily as needed for heartburn.      . Thiamine HCl (VITAMIN B-1 PO) Take by mouth daily.      . vitamin C (ASCORBIC ACID) 500 MG tablet Take 500 mg by mouth daily.        No current facility-administered medications on file prior to visit.    Allergies:  Allergies  Allergen Reactions  . Penicillins Swelling  . Ambien [Zolpidem Tartrate] Other (See Comments)    Causes  hallucinations.      Review of Systems:  CONSTITUTIONAL: No fevers, chills, night sweats, or weight loss.   EYES: No visual changes or eye pain ENT: No hearing changes.  No history of nose bleeds.   RESPIRATORY: No cough, wheezing and shortness of breath.   CARDIOVASCULAR: Negative for chest pain, and palpitations.   GI: Negative for abdominal discomfort, blood in stools or black stools.  No recent change in bowel habits.   GU:  No history of incontinence.   MUSCLOSKELETAL: No history of joint pain or swelling.  No myalgias.   SKIN: Negative for lesions, rash, and itching.   ENDOCRINE: Negative for cold or heat intolerance, polydipsia or goiter.   PSYCH:  No depression or anxiety symptoms.   NEURO: As Above.   Vital Signs:  BP 120/60  Pulse 86  Ht 5\' 11"  (1.803 m)  Wt 203 lb (92.08 kg)  BMI 28.33 kg/m2  Neurological Exam: MENTAL STATUS including orientation to time, place, person, recent and remote memory, attention span and concentration, language, and fund of knowledge is normal.  Speech is not dysarthric.  CRANIAL NERVES: No visual field defects. Pupils equal round and reactive to light.  Normal conjugate, extra-ocular eye movements in all directions of gaze.  No ptosis. Normal facial sensation.  Face is symmetric. Palate elevates symmetrically.  Tongue is midline.  MOTOR:  Motor strength is 5/5 in all extremities, except 4/5 in dorsiflexion, toe extension/flexion, inversion and eversion.  No atrophy, fasciculations or abnormal movements.   Tone is normal.    MSRs:  Reflexes are 2+/4 in upper extremities, 1+ at patella, and absent at the Achilles.    SENSORY:  Vibration reduced to 30% at ankles and barely present at the great toe.  Proprioception impaired at great toe.    COORDINATION/GAIT:  Normal finger-to- nose-finger and heel-to-shin.  Intact rapid alternating movements bilaterally.  Gait narrow based and stable.    Data: EMG 05/10/2012 of bilateral legs: 1. There is  electrophysiological evidence of a chronic sensoriomotor length-dependent, predominately axonal polyneuropathy affecting bilateral lower extremities; moderate in degree electrically. 2. Superimposed multilevel intraspinal canal lesion (i.e. radiculopathy) affecting L5 > S1 nerve roots/segments cannot be excluded, but it thought to be less likely based on the pattern of axon loss seen on needle electrode examination. 3. Superimposed chronic left common peroneal neuropathy at the fibular head.   Component     Latest Ref Rng 05/04/2013  TSH     0.35 - 5.50 uIU/mL 0.38  Vitamin B-12     211 - 911 pg/mL 1277 (H)  Copper     70 - 175 mcg/dL 93  Vitamin B6  2.1 - 21.7 ng/mL 32.3 (H)   Lab Results  Component Value Date   HGBA1C 6.4* 11/13/2012     IMPRESSION/PLAN: 1. Distal and symmetric large peripheral neuropathy, mild  - likely due to pre-diabetes, which is well controlled by diet  - normal neuropathy labs except elevated vitamin B6  - recommended to stop taking B6 supplements  - Fall precautions discussed  2. Left peroneal mononeuropathy at the fibular head  -  Encouraged to avoid trauma, crossing the legs, or compression to the left knee   -  If no improvement at next visit, consider imaging the left leg 3. Return to clinic in 31-months   The duration of this appointment visit was 30 minutes of face-to-face time with the patient.  Greater than 50% of this time was spent in counseling, explanation of diagnosis, planning of further management, and coordination of care.   Thank you for allowing me to participate in patient's care.  If I can answer any additional questions, I would be pleased to do so.    Sincerely,    Donika K. Posey Pronto, DO

## 2013-05-31 ENCOUNTER — Encounter: Payer: Medicare Other | Admitting: Neurology

## 2013-06-08 ENCOUNTER — Ambulatory Visit: Payer: Medicare Other | Admitting: Neurology

## 2013-08-16 ENCOUNTER — Telehealth: Payer: Self-pay | Admitting: Neurology

## 2013-08-16 ENCOUNTER — Ambulatory Visit: Payer: Medicare PPO | Admitting: Neurology

## 2013-08-16 NOTE — Telephone Encounter (Signed)
Pt no showed today's 3 month follow up appt with Dr. Posey Pronto. No show letter mailed to pt / Sherri S.

## 2013-10-05 ENCOUNTER — Inpatient Hospital Stay (HOSPITAL_COMMUNITY)
Admission: EM | Admit: 2013-10-05 | Discharge: 2013-10-09 | DRG: 282 | Disposition: A | Discharge status: Home / Self Care | Payer: Medicare PPO | Attending: Internal Medicine | Admitting: Internal Medicine

## 2013-10-05 ENCOUNTER — Emergency Department (HOSPITAL_COMMUNITY): Payer: Medicare PPO

## 2013-10-05 ENCOUNTER — Encounter (HOSPITAL_COMMUNITY): Payer: Self-pay | Admitting: Emergency Medicine

## 2013-10-05 DIAGNOSIS — Z96659 Presence of unspecified artificial knee joint: Secondary | ICD-10-CM

## 2013-10-05 DIAGNOSIS — I480 Paroxysmal atrial fibrillation: Secondary | ICD-10-CM | POA: Diagnosis present

## 2013-10-05 DIAGNOSIS — I2510 Atherosclerotic heart disease of native coronary artery without angina pectoris: Secondary | ICD-10-CM | POA: Diagnosis present

## 2013-10-05 DIAGNOSIS — Z6828 Body mass index (BMI) 28.0-28.9, adult: Secondary | ICD-10-CM

## 2013-10-05 DIAGNOSIS — I252 Old myocardial infarction: Secondary | ICD-10-CM

## 2013-10-05 DIAGNOSIS — Z96619 Presence of unspecified artificial shoulder joint: Secondary | ICD-10-CM

## 2013-10-05 DIAGNOSIS — R778 Other specified abnormalities of plasma proteins: Secondary | ICD-10-CM

## 2013-10-05 DIAGNOSIS — E785 Hyperlipidemia, unspecified: Secondary | ICD-10-CM | POA: Diagnosis present

## 2013-10-05 DIAGNOSIS — C649 Malignant neoplasm of unspecified kidney, except renal pelvis: Secondary | ICD-10-CM | POA: Diagnosis present

## 2013-10-05 DIAGNOSIS — Z9861 Coronary angioplasty status: Secondary | ICD-10-CM

## 2013-10-05 DIAGNOSIS — E119 Type 2 diabetes mellitus without complications: Secondary | ICD-10-CM | POA: Diagnosis present

## 2013-10-05 DIAGNOSIS — N182 Chronic kidney disease, stage 2 (mild): Secondary | ICD-10-CM | POA: Diagnosis present

## 2013-10-05 DIAGNOSIS — K219 Gastro-esophageal reflux disease without esophagitis: Secondary | ICD-10-CM | POA: Diagnosis present

## 2013-10-05 DIAGNOSIS — I129 Hypertensive chronic kidney disease with stage 1 through stage 4 chronic kidney disease, or unspecified chronic kidney disease: Secondary | ICD-10-CM | POA: Diagnosis present

## 2013-10-05 DIAGNOSIS — J45909 Unspecified asthma, uncomplicated: Secondary | ICD-10-CM | POA: Diagnosis present

## 2013-10-05 DIAGNOSIS — Z79899 Other long term (current) drug therapy: Secondary | ICD-10-CM

## 2013-10-05 DIAGNOSIS — M129 Arthropathy, unspecified: Secondary | ICD-10-CM | POA: Diagnosis present

## 2013-10-05 DIAGNOSIS — E669 Obesity, unspecified: Secondary | ICD-10-CM | POA: Diagnosis present

## 2013-10-05 DIAGNOSIS — Z85118 Personal history of other malignant neoplasm of bronchus and lung: Secondary | ICD-10-CM

## 2013-10-05 DIAGNOSIS — I5031 Acute diastolic (congestive) heart failure: Secondary | ICD-10-CM

## 2013-10-05 DIAGNOSIS — Z7901 Long term (current) use of anticoagulants: Secondary | ICD-10-CM

## 2013-10-05 DIAGNOSIS — I10 Essential (primary) hypertension: Secondary | ICD-10-CM | POA: Diagnosis present

## 2013-10-05 DIAGNOSIS — Z85528 Personal history of other malignant neoplasm of kidney: Secondary | ICD-10-CM

## 2013-10-05 DIAGNOSIS — Z9221 Personal history of antineoplastic chemotherapy: Secondary | ICD-10-CM

## 2013-10-05 DIAGNOSIS — Z87891 Personal history of nicotine dependence: Secondary | ICD-10-CM

## 2013-10-05 DIAGNOSIS — Z7982 Long term (current) use of aspirin: Secondary | ICD-10-CM

## 2013-10-05 DIAGNOSIS — R799 Abnormal finding of blood chemistry, unspecified: Secondary | ICD-10-CM

## 2013-10-05 DIAGNOSIS — R7989 Other specified abnormal findings of blood chemistry: Secondary | ICD-10-CM

## 2013-10-05 DIAGNOSIS — M1990 Unspecified osteoarthritis, unspecified site: Secondary | ICD-10-CM | POA: Diagnosis present

## 2013-10-05 DIAGNOSIS — R0609 Other forms of dyspnea: Secondary | ICD-10-CM

## 2013-10-05 DIAGNOSIS — I959 Hypotension, unspecified: Secondary | ICD-10-CM | POA: Diagnosis present

## 2013-10-05 DIAGNOSIS — R0989 Other specified symptoms and signs involving the circulatory and respiratory systems: Secondary | ICD-10-CM

## 2013-10-05 DIAGNOSIS — R0600 Dyspnea, unspecified: Secondary | ICD-10-CM

## 2013-10-05 DIAGNOSIS — I214 Non-ST elevation (NSTEMI) myocardial infarction: Secondary | ICD-10-CM | POA: Diagnosis present

## 2013-10-05 DIAGNOSIS — I25119 Atherosclerotic heart disease of native coronary artery with unspecified angina pectoris: Secondary | ICD-10-CM | POA: Diagnosis present

## 2013-10-05 DIAGNOSIS — I4891 Unspecified atrial fibrillation: Principal | ICD-10-CM | POA: Diagnosis present

## 2013-10-05 HISTORY — DX: Non-ST elevation (NSTEMI) myocardial infarction: I21.4

## 2013-10-05 HISTORY — DX: Type 2 diabetes mellitus without complications: E11.9

## 2013-10-05 HISTORY — DX: Paroxysmal atrial fibrillation: I48.0

## 2013-10-05 HISTORY — DX: Long term (current) use of anticoagulants: Z79.01

## 2013-10-05 HISTORY — DX: Malignant neoplasm of unspecified kidney, except renal pelvis: C64.9

## 2013-10-05 HISTORY — DX: Acute myocardial infarction, unspecified: I21.9

## 2013-10-05 HISTORY — DX: Personal history of other medical treatment: Z92.89

## 2013-10-05 HISTORY — DX: Pneumonia, unspecified organism: J18.9

## 2013-10-05 LAB — PRO B NATRIURETIC PEPTIDE: PRO B NATRI PEPTIDE: 2582 pg/mL — AB (ref 0–125)

## 2013-10-05 LAB — CBC
HCT: 44.8 % (ref 39.0–52.0)
Hemoglobin: 15.7 g/dL (ref 13.0–17.0)
MCH: 30.7 pg (ref 26.0–34.0)
MCHC: 35 g/dL (ref 30.0–36.0)
MCV: 87.7 fL (ref 78.0–100.0)
Platelets: 185 10*3/uL (ref 150–400)
RBC: 5.11 MIL/uL (ref 4.22–5.81)
RDW: 14.4 % (ref 11.5–15.5)
WBC: 6.3 10*3/uL (ref 4.0–10.5)

## 2013-10-05 LAB — BASIC METABOLIC PANEL
BUN: 26 mg/dL — ABNORMAL HIGH (ref 6–23)
CHLORIDE: 102 meq/L (ref 96–112)
CO2: 22 meq/L (ref 19–32)
Calcium: 9.4 mg/dL (ref 8.4–10.5)
Creatinine, Ser: 1.26 mg/dL (ref 0.50–1.35)
GFR calc Af Amer: 64 mL/min — ABNORMAL LOW (ref >90)
GFR calc non Af Amer: 55 mL/min — ABNORMAL LOW (ref >90)
GLUCOSE: 130 mg/dL — AB (ref 70–99)
POTASSIUM: 4.6 meq/L (ref 3.7–5.3)
SODIUM: 138 meq/L (ref 137–147)

## 2013-10-05 LAB — I-STAT TROPONIN, ED: Troponin i, poc: 0.46 ng/mL (ref 0.00–0.08)

## 2013-10-05 LAB — TSH: TSH: 1.55 u[IU]/mL (ref 0.350–4.500)

## 2013-10-05 LAB — TROPONIN I
TROPONIN I: 0.88 ng/mL — AB (ref <0.30)
Troponin I: 0.58 ng/mL (ref <0.30)

## 2013-10-05 LAB — MAGNESIUM: Magnesium: 2.1 mg/dL (ref 1.5–2.5)

## 2013-10-05 MED ORDER — RIVAROXABAN 20 MG PO TABS
20.0000 mg | ORAL_TABLET | Freq: Every day | ORAL | Status: DC
  Filled 2013-10-05: qty 1

## 2013-10-05 MED ORDER — FAMOTIDINE 10 MG PO TABS
10.0000 mg | ORAL_TABLET | Freq: Every day | ORAL | Status: DC
Start: 2013-10-05 — End: 2013-10-09
  Administered 2013-10-05 – 2013-10-09 (×4): 10 mg via ORAL
  Filled 2013-10-05 (×5): qty 1

## 2013-10-05 MED ORDER — METOPROLOL TARTRATE 1 MG/ML IV SOLN: 2.5000 mg | INTRAVENOUS | Status: AC

## 2013-10-05 MED ORDER — ASPIRIN EC 325 MG PO TBEC
325.0000 mg | DELAYED_RELEASE_TABLET | Freq: Every day | ORAL | Status: DC
  Administered 2013-10-06 – 2013-10-07 (×2): 325 mg via ORAL
  Filled 2013-10-05 (×2): qty 1

## 2013-10-05 MED ORDER — VITAMIN D3 25 MCG (1000 UNIT) PO TABS
1000.0000 [IU] | ORAL_TABLET | Freq: Every day | ORAL | Status: DC
  Administered 2013-10-05 – 2013-10-09 (×5): 1000 [IU] via ORAL
  Filled 2013-10-05 (×5): qty 1

## 2013-10-05 MED ORDER — NITROGLYCERIN 0.4 MG SL SUBL: 0.4000 mg | SUBLINGUAL_TABLET | SUBLINGUAL | Status: DC | PRN

## 2013-10-05 MED ORDER — HEPARIN BOLUS VIA INFUSION
3000.0000 [IU] | Freq: Once | INTRAVENOUS | Status: AC
  Administered 2013-10-05: 3000 [IU] via INTRAVENOUS
  Filled 2013-10-05: qty 3000

## 2013-10-05 MED ORDER — LORATADINE 10 MG PO TABS
10.0000 mg | ORAL_TABLET | Freq: Every day | ORAL | Status: DC
  Administered 2013-10-05 – 2013-10-09 (×5): 10 mg via ORAL
  Filled 2013-10-05 (×5): qty 1

## 2013-10-05 MED ORDER — LISINOPRIL 20 MG PO TABS
20.0000 mg | ORAL_TABLET | Freq: Every day | ORAL | Status: DC
  Administered 2013-10-06: 20 mg via ORAL
  Filled 2013-10-05 (×3): qty 1

## 2013-10-05 MED ORDER — FLUTICASONE PROPIONATE 50 MCG/ACT NA SUSP
2.0000 | Freq: Every day | NASAL | Status: DC | PRN
  Filled 2013-10-05: qty 16

## 2013-10-05 MED ORDER — ATORVASTATIN CALCIUM 10 MG PO TABS
10.0000 mg | ORAL_TABLET | Freq: Every day | ORAL | Status: DC
  Administered 2013-10-06 – 2013-10-09 (×3): 10 mg via ORAL
  Filled 2013-10-05 (×6): qty 1

## 2013-10-05 MED ORDER — OMEGA-3-ACID ETHYL ESTERS 1 G PO CAPS
2.0000 g | ORAL_CAPSULE | Freq: Every day | ORAL | Status: DC
  Administered 2013-10-05 – 2013-10-09 (×5): 2 g via ORAL
  Filled 2013-10-05 (×5): qty 2

## 2013-10-05 MED ORDER — MAGNESIUM GLUCONATE 500 MG PO TABS
500.0000 mg | ORAL_TABLET | Freq: Two times a day (BID) | ORAL | Status: DC
  Administered 2013-10-05 – 2013-10-09 (×7): 500 mg via ORAL
  Filled 2013-10-05 (×10): qty 1

## 2013-10-05 MED ORDER — VITAMIN C 500 MG PO TABS
500.0000 mg | ORAL_TABLET | Freq: Every day | ORAL | Status: DC
  Administered 2013-10-05 – 2013-10-09 (×5): 500 mg via ORAL
  Filled 2013-10-05 (×5): qty 1

## 2013-10-05 MED ORDER — METOPROLOL TARTRATE 25 MG PO TABS
25.0000 mg | ORAL_TABLET | Freq: Two times a day (BID) | ORAL | Status: DC
  Administered 2013-10-05 – 2013-10-09 (×7): 25 mg via ORAL
  Filled 2013-10-05 (×9): qty 1

## 2013-10-05 MED ORDER — HEPARIN (PORCINE) IN NACL 100-0.45 UNIT/ML-% IJ SOLN
1450.0000 [IU]/h | INTRAMUSCULAR | Status: DC
  Administered 2013-10-05: 1450 [IU]/h via INTRAVENOUS
  Filled 2013-10-05 (×2): qty 250

## 2013-10-05 NOTE — Progress Notes (Signed)
On Call Cardiology Note  I was contacted by the patient's RN regarding the second troponin having increased from 0.46 to 0.88.  Patient is asymptomatic at this time but remains in atrial fibrillation (HR 100-120 bpm).  Xarelto had been previously ordered for atrial fibrillation.  However, in setting of rising troponin, will d/c Xarelto and initiate heparin infusion in the event that the patient were to need an invasive procedure (e.g. LHC) during this hospitalization.  Pt is in agreement with this plan.

## 2013-10-05 NOTE — ED Notes (Signed)
Pt meal tray at bedside, pt eating.

## 2013-10-05 NOTE — ED Provider Notes (Signed)
CSN: [id number]     Arrival date & time 10/05/13  1343 History   First MD Initiated Contact with Patient 10/05/13 1348     Chief Complaint  Patient presents with  . Atrial Fibrillation     (Consider location/radiation/quality/duration/timing/severity/associated sxs/prior Treatment) HPI Comments: Patient is a 73 year old male with history of MI with stent 16 years ago, A. fib 05/09/2004. He presents today with complaints of irregular heartbeat and and breaking out in a sweat. He stated that this occurred last night shortly after returning home from [facility]. He has a mobile home which he was returning to storage when his symptoms began. He denies having any chest pain.   He has a history of renal cancer for which he had a nephrectomy approximately 4 years ago. Prior to undergoing treatment for this he had a heart cath which she states revealed no significant lesions. Cardiologist is Dr. Pernell Dupre.  Patient is a 73 y.o. male presenting with atrial fibrillation. The history is provided by the patient.  Atrial Fibrillation This is a recurrent problem. The current episode started yesterday. The problem occurs constantly. The problem has not changed since onset.Pertinent negatives include no chest pain and no shortness of breath. Nothing aggravates the symptoms. Nothing relieves the symptoms. He has tried nothing for the symptoms. The treatment provided no relief.    Past Medical History  Diagnosis Date  . Hypertension   . Cancer     pt with left kidney ca with resultant nephrectomy and mets to the lungs.  . Coronary artery disease   . Hyperlipidemia   . Heart attack     1995   . Shortness of breath     related to sutent and anti PD-1( anticancer drugs )   . Anemia     at time of cancer   . Blood transfusion     last one 2011   . GERD (gastroesophageal reflux disease)   . Arthritis     knees   . Diverticulosis   . H/O interleukin-2 treatment     2012 - for tx of metastatic renal  cell carcinoma   . Diabetes     hx of pt not on meds at this time   . Hearing loss     hearing aids   . Renal disorder     hx of kidney cancer , CKD III  . Left knee DJD   . Dysrhythmia     atrial fib hx of n   [facility]  . Medical history non-contributory    Past Surgical History  Procedure Laterality Date  . Nephrectomy    . Cardiac surgery    . Knee surgery    . Pilonidal cyst drainage    . Shoulder surgery  1997    right shoulder  . Kidney surgery  12/14/2009    removed right kidney  . Joint replacement      right shoulder replacement   . Tonsillectomy      and adenoidectomy   . Cardiac catheterization      05/2009   . Coronary angioplasty with stent placement      1997 at [facility] to RCA   . Inguinal hernia repair  09/22/2011    Procedure: HERNIA REPAIR INGUINAL ADULT BILATERAL;  Surgeon: Imogene Burn. Georgette Dover, MD;  Location: [facility];  Service: General;  Laterality: Bilateral;  . Orchiectomy  09/22/2011    Procedure: ORCHIECTOMY;  Surgeon: Franchot Gallo, MD;  Location: [facility];  Service: Urology;  Laterality: Left;  Left Testicular Exploration, Inguinal Approach,  Testicular Orchiectomy,   . Hernia repair  09/22/11    BIH  . Total knee arthroplasty Left 11/13/2012    Dr Noemi Chapel  . Knee arthroscopy Left 11/13/2012    Procedure: ARTHROSCOPY KNEE;  Surgeon: Lorn Junes, MD;  Location: [place];  Service: Orthopedics;  Laterality: Left;  . Total knee arthroplasty Left 11/13/2012    Procedure: TOTAL KNEE ARTHROPLASTY;  Surgeon: Lorn Junes, MD;  Location: [place];  Service: Orthopedics;  Laterality: Left;  . Cheilectomy Right 04/13/2013    Procedure: Right Great Toe Cheilectomy, Debride Tendon and Abscess, Apply Stimulan Beads;  Surgeon: Newt Minion, MD;  Location: [place];  Service: Orthopedics;  Laterality: Right;  Right Great Toe Cheilectomy, Debride Tendon and Abscess, Apply Stimulan Beads   Family History  Problem Relation Age of Onset  . Heart disease Father     Died, 31   . Thyroid disease Mother     Living, 64  . Thyroid disease Brother   . Dementia Mother   . Hypertension Father   . Healthy Daughter   . Skin cancer Son    History  Substance Use Topics  . Smoking status: Former Smoker -- 1.00 packs/day for 2 years    Types: Cigarettes    Quit date: 05/03/1961  . Smokeless tobacco: Never Used  . Alcohol Use: Yes     Comment: occasional glass of wine     Review of Systems  Respiratory: Negative for shortness of breath.   Cardiovascular: Negative for chest pain.  All other systems reviewed and are negative.     Allergies  Penicillins; Ambien; and Codeine  Home Medications   Prior to Admission medications   Medication Sig Start Date End Date Taking? Authorizing Provider  aspirin 325 MG EC tablet Take 325 mg by mouth daily.   Yes Historical Provider, MD  atorvastatin (LIPITOR) 10 MG tablet Take 10 mg by mouth daily with breakfast. Patient is presently holding this medication til the second week in January per doctor instructions.   Yes Historical Provider, MD  azelastine (ASTELIN) 137 MCG/SPRAY nasal spray Place 1 spray into the nose 2 (two) times daily as needed. For congestion; Use in each nostril as directed   Yes Historical Provider, MD  cholecalciferol (VITAMIN D) 1000 UNITS tablet Take 1,000 Units by mouth daily.   Yes Historical Provider, MD  Coenzyme Q10 (COQ10) 100 MG CAPS Take 100 mg by mouth daily.   Yes Historical Provider, MD  cyanocobalamin 100 MCG tablet Take 100 mcg by mouth daily.   Yes Historical Provider, MD  EPINEPHrine (EPI-PEN) 0.3 mg/0.3 mL SOAJ Inject 0.3 mg into the muscle once.   Yes Historical Provider, MD  fexofenadine (ALLEGRA) 180 MG tablet Take 180 mg by mouth daily with breakfast. Pt takes allegra for 30 days then zyrtec for 30 days   Yes Historical Provider, MD  fluticasone (FLONASE) 50 MCG/ACT nasal spray Place 2 sprays into the nose daily as needed for allergies. Do not use while on "IL2"   Yes Historical  Provider, MD  lisinopril (PRINIVIL,ZESTRIL) 20 MG tablet Take 20 mg by mouth daily. For raised BP while on "IL2", if b/p low per pt only takes one per day per MD   Yes Historical Provider, MD  magnesium gluconate (MAGONATE) 500 MG tablet Take 500 mg by mouth 2 (two) times daily.    Yes Historical Provider, MD  Multiple Vitamin (MULTIVITAMIN WITH MINERALS) TABS Take 1  tablet by mouth daily.   Yes Historical Provider, MD  nitroGLYCERIN (NITROSTAT) 0.4 MG SL tablet Place 0.4 mg under the tongue every 5 (five) minutes as needed for chest pain.   Yes Historical Provider, MD  Omega-3 Fatty Acids (FISH OIL) 1200 MG CAPS Take 1,200 mg by mouth daily.   Yes Historical Provider, MD  Polyethyl Glycol-Propyl Glycol (SYSTANE) 0.4-0.3 % SOLN Place 1 drop into both eyes daily as needed (dry eyes).   Yes Historical Provider, MD  ranitidine (ZANTAC) 75 MG tablet Take 150 mg by mouth daily as needed for heartburn.    Yes Historical Provider, MD  vitamin C (ASCORBIC ACID) 500 MG tablet Take 500 mg by mouth daily.    Yes Historical Provider, MD   BP 120/79  Pulse 77  Temp(Src) 97.8 F (36.6 C) (Oral)  Resp 21  Ht 5\' 11"  (1.803 m)  Wt 202 lb (91.627 kg)  BMI 28.19 kg/m2  SpO2 99% Physical Exam  Nursing note and vitals reviewed. Constitutional: He is oriented to person, place, and time. He appears well-developed and well-nourished. No distress.  HENT:  Head: Normocephalic and atraumatic.  Mouth/Throat: Oropharynx is clear and moist.  Neck: Normal range of motion. Neck supple.  Cardiovascular: Normal rate, regular rhythm and normal heart sounds.   No murmur heard. Pulmonary/Chest: Effort normal and breath sounds normal. No respiratory distress. He has no wheezes.  Abdominal: Soft. Bowel sounds are normal. He exhibits no distension. There is no tenderness.  Musculoskeletal: Normal range of motion. He exhibits no edema.  Neurological: He is alert and oriented to person, place, and time.  Skin: Skin is warm  and dry. He is not diaphoretic.    ED Course  Procedures (including critical care time) Labs Review Labs Reviewed  I-STAT TROPOININ, ED - Abnormal; Notable for the following:    Troponin i, poc 0.46 (*)    All other components within normal limits  CBC  BASIC METABOLIC PANEL    Imaging Review Dg Chest Port 1 View  10/05/2013   CLINICAL DATA:  Atrial fibrillation.  EXAM: PORTABLE CHEST - 1 VIEW  COMPARISON:  November 07, 2012.  FINDINGS: The heart size and mediastinal contours are within normal limits. Both lungs are clear. No pneumothorax or pleural effusion is noted. Status post right shoulder arthroplasty.  IMPRESSION: No acute cardiopulmonary abnormality seen.   Electronically Signed   By: Sabino Dick M.D.   On: 10/05/2013 14:14     Date: 10/05/2013  Rate: 98  Rhythm: atrial fibrillation  QRS Axis: normal  Intervals: normal  ST/T Wave abnormalities: normal  Conduction Disutrbances:none  Narrative Interpretation:   Old EKG Reviewed: changes noted    MDM   Final diagnoses:  None    Patient presents with complaints of palpitations and was found to be in atrial fibrillation. Is also noted to have an elevated troponin of 0.46 with normal renal function. He has a history of stent placement approximately 16 years ago. I've spoken with cardiology who will evaluate patient in the ER.    Veryl Speak, MD 10/05/13 [phone]

## 2013-10-05 NOTE — ED Notes (Signed)
Family at bedside. 

## 2013-10-05 NOTE — ED Notes (Signed)
Attempted report 

## 2013-10-05 NOTE — ED Notes (Signed)
Cardiology at bedside.

## 2013-10-05 NOTE — ED Notes (Signed)
Per EMS- Pt coming from doctor's office where he went for low BP , was found to be in AFIB, rate 114. Denies any CP. Reports he just feels tired and has a small h/a. Pt is a x 4. BP 116/73.

## 2013-10-05 NOTE — Progress Notes (Signed)
ANTICOAGULATION CONSULT NOTE - Initial Consult  Pharmacy Consult for Heparin Indication: atrial fibrillation  Allergies  Allergen Reactions  . Penicillins Swelling  . Ambien [Zolpidem Tartrate] Other (See Comments)    Causes hallucinations.   . Codeine     hallucinations    Patient Measurements: Height: 5\' 11"  (180.3 cm) Weight: 200 lb (90.719 kg) IBW/kg (Calculated) : 75.3 Heparin Dosing Weight: 91 kg  Vital Signs: Temp: 98.1 F (36.7 C) (06/05 1835) Temp src: Oral (06/05 1835) BP: 132/82 mmHg (06/05 1835) Pulse Rate: 88 (06/05 1835)  Labs:  Recent Labs  10/05/13 1350 10/05/13 1717  HGB 15.7  --   HCT 44.8  --   PLT 185  --   CREATININE 1.26  --   TROPONINI  --  0.88*   Estimated Creatinine Clearance: 60.2 ml/min (by C-G formula based on Cr of 1.26).  Medical History: Past Medical History  Diagnosis Date  . Hypertension   . Renal cell carcinoma     a. s/p nephrectomy  . Coronary artery disease     a. s/p MI with stent in 2000  . Hyperlipidemia   . Shortness of breath     a. related to chemo drugs  . Anemia   . GERD (gastroesophageal reflux disease)   . Arthritis   . Diverticulosis   . Diabetes   . Left knee DJD   . PAF (paroxysmal atrial fibrillation)    Medications:  Prescriptions prior to admission  Medication Sig Dispense Refill  . aspirin 325 MG EC tablet Take 325 mg by mouth daily.      Marland Kitchen atorvastatin (LIPITOR) 10 MG tablet Take 10 mg by mouth daily with breakfast. Patient is presently holding this medication til the second week in January per doctor instructions.      Marland Kitchen azelastine (ASTELIN) 137 MCG/SPRAY nasal spray Place 1 spray into the nose 2 (two) times daily as needed. For congestion; Use in each nostril as directed      . cholecalciferol (VITAMIN D) 1000 UNITS tablet Take 1,000 Units by mouth daily.      . Coenzyme Q10 (COQ10) 100 MG CAPS Take 100 mg by mouth daily.      . cyanocobalamin 100 MCG tablet Take 100 mcg by mouth daily.       Marland Kitchen EPINEPHrine (EPI-PEN) 0.3 mg/0.3 mL SOAJ Inject 0.3 mg into the muscle once.      . fexofenadine (ALLEGRA) 180 MG tablet Take 180 mg by mouth daily with breakfast. Pt takes allegra for 30 days then zyrtec for 30 days      . fluticasone (FLONASE) 50 MCG/ACT nasal spray Place 2 sprays into the nose daily as needed for allergies. Do not use while on "IL2"      . lisinopril (PRINIVIL,ZESTRIL) 20 MG tablet Take 20 mg by mouth daily. For raised BP while on "IL2", if b/p low per pt only takes one per day per MD      . magnesium gluconate (MAGONATE) 500 MG tablet Take 500 mg by mouth 2 (two) times daily.       . Multiple Vitamin (MULTIVITAMIN WITH MINERALS) TABS Take 1 tablet by mouth daily.      . nitroGLYCERIN (NITROSTAT) 0.4 MG SL tablet Place 0.4 mg under the tongue every 5 (five) minutes as needed for chest pain.      . Omega-3 Fatty Acids (FISH OIL) 1200 MG CAPS Take 1,200 mg by mouth daily.      Vladimir Faster Glycol-Propyl Glycol (SYSTANE) 0.4-0.3 %  SOLN Place 1 drop into both eyes daily as needed (dry eyes).      . ranitidine (ZANTAC) 75 MG tablet Take 150 mg by mouth daily as needed for heartburn.       . vitamin C (ASCORBIC ACID) 500 MG tablet Take 500 mg by mouth daily.        Assessment: 73yo male admitted with palpitations.  He has a past medical history of CAD and other co-morbidities noted above.  Rivaroxaban was ordered but was not given upon discussion with his nurse.  We have been asked to start IV heparin while cardiac work-up is ongoing.  He has a positive troponin, H/H is stable as is platelets.  No noted bleeding complications.  Goal of Therapy:  Heparin level 0.3-0.7 units/ml Monitor platelets by anticoagulation protocol: Yes   Plan:  1.  IV Heparin 3000 units bolus and then continuous IV Heparin at 1450 units/hr 2.  Check Heparin level with AM labs and daily. 3.  Monitor for bleeding complications.  Rober Minion, PharmD., MS Clinical Pharmacist Pager:  [phone] Thank  you for allowing pharmacy to be part of this patients care team. 10/05/2013,9:29 PM

## 2013-10-05 NOTE — H&P (Signed)
CARDIOLOGY H&P   Patient ID: Donald Gray MRN: [id number] DOB/AGE: December 19, 1940 73 y.o.  Admit date: 10/05/2013  Primary Physician   Irven Shelling, MD Primary Cardiologist  Palpitations  Reason for Consultation  Dr. Tamala Julian   HPI:  Donald Gray is a 73 y.o. male with a history of asthma, DM, obesity, CAD s/p remote stent (~1995), renal cancer s/p nephrectomy ~4 years ago, in remission (completed immunotherapy clinical trial 2011), GERD and one episode of atrial fibrillation (2006) who was sent from PCPs office to [facility] today for atrial fibrillation and found to have elevated troponin (0.46).  He has a history of renal cell cancer with pulmonary mets for which he had a nephrectomy approximately 4 years ago and completed therapy with IL-2 in a chemotherapy trial. Prior to undergoing treatment for this [place] significant lesions. He states that he had a normal nuclear stress test last year. Patient had an episode of atrial fibrillation in 2006 after a vacation in which he ate lots of rich foods and drank many cups of coffee. This lasted only hours and he was placed on Coumadin anticoagulation for 6 months which was later discontinued as the dysrhythmia did not recur. He has had no problems since. He had a cardiopulmonary exercise test for evaluation of chronic dyspnea in 2013 which demonstrated mild functional limitation and some chronotropic incompetence thought to be related to the patient's beta-blockade. There was no clear cardiac/circulatory limitation. Obesity was felt to be playing a role in his dyspnea. The dyspnea resolved with termination of chemo and BB.  Around 5 PM last night he noticed feeling weak and diaphoretic. He also noted some fluttering in his chest. His wife took his blood pressure which had SBPs in the 90s and the monitor noted that he was out of rhythm. The next morning he went to his primary care doctor's office  to be evaluated and was found to be in atrial fibrillation and was sent to the ED. He denies chest pain, shortness of breath, orthopnea, PND, lightheadedness or nausea/vomiting  He does say that he feels weak with moving  Deneis palpitations all the time. Marland Kitchen He denies recent exertional chest pain or shortness of breath. She denies recent alcohol or caffeine use. No recent fevers, chills, coughs or colds. No blood in the stool or urine.  Currently his heart rates are in the 110 (afib on monitor) and he is feeling ok.     Past Medical History  Diagnosis Date  . Hypertension   . Renal cell carcinoma     a. s/p nephrectomy  . Coronary artery disease     a. s/p MI with stent in 2000  . Hyperlipidemia   . Shortness of breath     a. related to chemo drugs  . Anemia   . GERD (gastroesophageal reflux disease)   . Arthritis   . Diverticulosis   . Diabetes   . Left knee DJD   . PAF (paroxysmal atrial fibrillation)      Past Surgical History  Procedure Laterality Date  . Nephrectomy    . Cardiac surgery    . Knee surgery    . Pilonidal cyst drainage    . Shoulder surgery  1997    right shoulder  . Kidney surgery  12/14/2009    removed right kidney  . Joint replacement      right shoulder replacement   . Tonsillectomy  and adenoidectomy   . Cardiac catheterization      05/2009   . Coronary angioplasty with stent placement      1997 at [facility] to RCA   . Inguinal hernia repair  09/22/2011    Procedure: HERNIA REPAIR INGUINAL ADULT BILATERAL;  Surgeon: Imogene Burn. Georgette Dover, MD;  Location: [facility];  Service: General;  Laterality: Bilateral;  . Orchiectomy  09/22/2011    Procedure: ORCHIECTOMY;  Surgeon: Franchot Gallo, MD;  Location: [facility];  Service: Urology;  Laterality: Left;  Left Testicular Exploration, Inguinal Approach,  Testicular Orchiectomy,   . Hernia repair  09/22/11    [facility]  . Total knee arthroplasty Left 11/13/2012    Dr Noemi Chapel  . Knee arthroscopy Left 11/13/2012     Procedure: ARTHROSCOPY KNEE;  Surgeon: Lorn Junes, MD;  Location: [place];  Service: Orthopedics;  Laterality: Left;  . Total knee arthroplasty Left 11/13/2012    Procedure: TOTAL KNEE ARTHROPLASTY;  Surgeon: Lorn Junes, MD;  Location: [place];  Service: Orthopedics;  Laterality: Left;  . Cheilectomy Right 04/13/2013    Procedure: Right Great Toe Cheilectomy, Debride Tendon and Abscess, Apply Stimulan Beads;  Surgeon: Newt Minion, MD;  Location: [place];  Service: Orthopedics;  Laterality: Right;  Right Great Toe Cheilectomy, Debride Tendon and Abscess, Apply Stimulan Beads    Allergies  Allergen Reactions  . Penicillins Swelling  . Ambien [Zolpidem Tartrate] Other (See Comments)    Causes hallucinations.   . Codeine     hallucinations     I have reviewed the patient's current medications       Prior to Admission medications   Medication Sig Start Date End Date Taking? Authorizing Provider  aspirin 325 MG EC tablet Take 325 mg by mouth daily.   Yes Historical Provider, MD  atorvastatin (LIPITOR) 10 MG tablet Take 10 mg by mouth daily with breakfast. Patient is presently holding this medication til the second week in January per doctor instructions.   Yes Historical Provider, MD  azelastine (ASTELIN) 137 MCG/SPRAY nasal spray Place 1 spray into the nose 2 (two) times daily as needed. For congestion; Use in each nostril as directed   Yes Historical Provider, MD  cholecalciferol (VITAMIN D) 1000 UNITS tablet Take 1,000 Units by mouth daily.   Yes Historical Provider, MD  Coenzyme Q10 (COQ10) 100 MG CAPS Take 100 mg by mouth daily.   Yes Historical Provider, MD  cyanocobalamin 100 MCG tablet Take 100 mcg by mouth daily.   Yes Historical Provider, MD  EPINEPHrine (EPI-PEN) 0.3 mg/0.3 mL SOAJ Inject 0.3 mg into the muscle once.   Yes Historical Provider, MD  fexofenadine (ALLEGRA) 180 MG tablet Take 180 mg by mouth daily with breakfast. Pt takes allegra for 30 days then zyrtec for 30  days   Yes Historical Provider, MD  fluticasone (FLONASE) 50 MCG/ACT nasal spray Place 2 sprays into the nose daily as needed for allergies. Do not use while on "IL2"   Yes Historical Provider, MD  lisinopril (PRINIVIL,ZESTRIL) 20 MG tablet Take 20 mg by mouth daily. For raised BP while on "IL2", if b/p low per pt only takes one per day per MD   Yes Historical Provider, MD  magnesium gluconate (MAGONATE) 500 MG tablet Take 500 mg by mouth 2 (two) times daily.    Yes Historical Provider, MD  Multiple Vitamin (MULTIVITAMIN WITH MINERALS) TABS Take 1 tablet by mouth daily.   Yes Historical Provider, MD  nitroGLYCERIN (NITROSTAT) 0.4 MG SL  tablet Place 0.4 mg under the tongue every 5 (five) minutes as needed for chest pain.   Yes Historical Provider, MD  Omega-3 Fatty Acids (FISH OIL) 1200 MG CAPS Take 1,200 mg by mouth daily.   Yes Historical Provider, MD  Polyethyl Glycol-Propyl Glycol (SYSTANE) 0.4-0.3 % SOLN Place 1 drop into both eyes daily as needed (dry eyes).   Yes Historical Provider, MD  ranitidine (ZANTAC) 75 MG tablet Take 150 mg by mouth daily as needed for heartburn.    Yes Historical Provider, MD  vitamin C (ASCORBIC ACID) 500 MG tablet Take 500 mg by mouth daily.    Yes Historical Provider, MD     History   Social History  . Marital Status: Married    Spouse Name: N/A    Number of Children: N/A  . Years of Education: N/A   Occupational History  . Not on file.   Social History Main Topics  . Smoking status: Former Smoker -- 1.00 packs/day for 2 years    Types: Cigarettes    Quit date: 05/03/1961  . Smokeless tobacco: Never Used  . Alcohol Use: Yes     Comment: occasional glass of wine   . Drug Use: No  . Sexual Activity: No   Other Topics Concern  . Not on file   Social History Narrative   Retired English as a second language teacher.   Lives with wife.    Family Status  Relation Status Death Age  . Father Deceased     heart disease  . Mother Alive   . Sister Alive     throid disease  .  Brother Alive     thyroid disease   Family History  Problem Relation Age of Onset  . Heart disease Father     Died, 44  . Thyroid disease Mother     Living, 53  . Thyroid disease Brother   . Dementia Mother   . Hypertension Father   . Healthy Daughter   . Skin cancer Son      ROS:  Full 14 point review of systems complete and found to be negative unless listed above.  Physical Exam: Blood pressure 137/81, pulse 28, temperature 97.8 F (36.6 C), temperature source Oral, resp. rate 21, height 5\' 11"  (1.803 m), weight 202 lb (91.627 kg), SpO2 95.00%.  General: Well developed, well nourished, male in no acute distress. obese Head: Eyes PERRLA, No xanthomas.   Normocephalic and atraumatic, oropharynx without edema or exudate. Dentition:  Lungs: CTAB Heart: irreg irreg. Tachycardic.  Mprmal S1, S2  No S3  No significant murmurs. Neck: No carotid bruits. No lymphadenopathy.  JVD. Abdomen: Bowel sounds present, abdomen soft and non-tender without masses or hernias noted. Msk:  No spine or cva tenderness. No weakness, no joint deformities or effusions. Extremities: No clubbing or cyanosis.  edema.  Neuro: Alert and oriented X 3. No focal deficits noted. Psych:  Good affect, responds appropriately Skin: No rashes or lesions noted.  Labs:   Lab Results  Component Value Date   WBC 6.3 10/05/2013   HGB 15.7 10/05/2013   HCT 44.8 10/05/2013   MCV 87.7 10/05/2013   PLT 185 10/05/2013      Recent Labs Lab 10/05/13 1350  NA 138  K 4.6  CL 102  CO2 22  BUN 26*  CREATININE 1.26  CALCIUM 9.4  GLUCOSE 130*    Recent Labs  10/05/13 1406  TROPIPOC 0.46*   TSH  Date/Time Value Ref Range Status  05/04/2013  9:53 AM  0.38  0.35 - 5.50 uIU/mL Final   Vitamin B-12  Date/Time Value Ref Range Status  05/04/2013  9:53 AM 1277* 211 - 911 pg/mL Final    Echo: none  ECG:  Atrial fibrillation with controlled VR  Radiology:  Dg Chest Port 1 View  10/05/2013   CLINICAL DATA:  Atrial  fibrillation.  EXAM: PORTABLE CHEST - 1 VIEW  COMPARISON:  November 07, 2012.  FINDINGS: The heart size and mediastinal contours are within normal limits. Both lungs are clear. No pneumothorax or pleural effusion is noted. Status post right shoulder arthroplasty.  IMPRESSION: No acute cardiopulmonary abnormality seen.   Electronically Signed   By: Sabino Dick M.D.   On: 10/05/2013 14:14    ASSESSMENT AND PLAN:    Principal Problem:   PAF (paroxysmal atrial fibrillation) Active Problems:   Obesity   Arthritis   Hyperlipidemia   Coronary artery disease   Hypertension   Renal cell carcinoma  Donald Gray is a 73 y.o. male with a history of asthma, DM, obesity, CAD s/p remote stent (~1995), renal cancer s/p nephrectomy ~4 years ago, in remission (completed immunotherapy clinical trial 2011), GERD and one episode of atrial fibrillation (2006) who was sent from PCPs office to [facility] today for atrial fibrillation and found to have elevated troponin (0.46).  Atrial fibrillation with RVR- currently in afib, VR is mildly increased.  -- CHADSVASC score- 4 (HTN, DM, age >47, CAD). Patient, with no bleeding and fall risk, should be on anticoagulation. Plan:   -- Admit to tele.  Follow HR and BP with movement  May need to titrate meds.   -- Start Xarelto 20mg  qd, kidney function okay  -- Check TSH, ECHO and BNP  NSTEMI- troponin ~0.46. Known CAD with stent placement in 1995. [place]. (no records) -- No CP or SOB. Possibly NSTEMI type II 2/2 atrial fibrillation Patient was hypotensive yesterday  May represent some subendocaridal ischemia.   -- Will cycle enzymes and serial ECGs -- Continue asa, statin.  Follow HR  May add Ca blocker or b blocker  DM- diet controlled. Will order a HgA1c. -- Continue Lisinopril   HTN- continue lisinopril   HLD- continue statin. lipid panel  RCC- s/p nephrectomy ~4 years ago, in remission (completed immunotherapy clinical trial 2011).     Signed: Perry Mount, PA-C 10/05/2013 4:24 PM  Pager [phone]  Patinet seen and examined  I have amended note above to reflect my findings.  WIll continue to follow response on telemetry.    Fay Records

## 2013-10-06 DIAGNOSIS — I517 Cardiomegaly: Secondary | ICD-10-CM

## 2013-10-06 LAB — BASIC METABOLIC PANEL
BUN: 27 mg/dL — ABNORMAL HIGH (ref 6–23)
CALCIUM: 9.2 mg/dL (ref 8.4–10.5)
CO2: 24 mEq/L (ref 19–32)
CREATININE: 1.43 mg/dL — AB (ref 0.50–1.35)
Chloride: 107 mEq/L (ref 96–112)
GFR, EST AFRICAN AMERICAN: 55 mL/min — AB (ref >90)
GFR, EST NON AFRICAN AMERICAN: 47 mL/min — AB (ref >90)
Glucose, Bld: 102 mg/dL — ABNORMAL HIGH (ref 70–99)
Potassium: 5.4 mEq/L — ABNORMAL HIGH (ref 3.7–5.3)
Sodium: 142 mEq/L (ref 137–147)

## 2013-10-06 LAB — HEPARIN LEVEL (UNFRACTIONATED): Heparin Unfractionated: 0.5 IU/mL (ref 0.30–0.70)

## 2013-10-06 LAB — HEMOGLOBIN A1C
Hgb A1c MFr Bld: 6.4 % — ABNORMAL HIGH (ref <5.7)
Mean Plasma Glucose: 137 mg/dL — ABNORMAL HIGH (ref <117)

## 2013-10-06 LAB — CBC
HCT: 42.8 % (ref 39.0–52.0)
Hemoglobin: 14.6 g/dL (ref 13.0–17.0)
MCH: 30 pg (ref 26.0–34.0)
MCHC: 34.1 g/dL (ref 30.0–36.0)
MCV: 88.1 fL (ref 78.0–100.0)
PLATELETS: 181 10*3/uL (ref 150–400)
RBC: 4.86 MIL/uL (ref 4.22–5.81)
RDW: 14.7 % (ref 11.5–15.5)
WBC: 6.2 10*3/uL (ref 4.0–10.5)

## 2013-10-06 LAB — LIPID PANEL
CHOL/HDL RATIO: 3.1 ratio
Cholesterol: 134 mg/dL (ref 0–200)
HDL: 43 mg/dL (ref >39)
LDL Cholesterol: 76 mg/dL (ref 0–99)
Triglycerides: 73 mg/dL (ref <150)
VLDL: 15 mg/dL (ref 0–40)

## 2013-10-06 LAB — TROPONIN I: TROPONIN I: 0.69 ng/mL — AB (ref <0.30)

## 2013-10-06 MED ORDER — ENOXAPARIN SODIUM 100 MG/ML ~~LOC~~ SOLN
1.0000 mg/kg | Freq: Two times a day (BID) | SUBCUTANEOUS | Status: DC
  Administered 2013-10-06 (×2): 90 mg via SUBCUTANEOUS
  Filled 2013-10-06 (×4): qty 1

## 2013-10-06 NOTE — Progress Notes (Addendum)
ANTICOAGULATION CONSULT NOTE - Initial Consult  Pharmacy Consult for Heparin Indication: atrial fibrillation  Allergies  Allergen Reactions  . Penicillins Swelling  . Ambien [Zolpidem Tartrate] Other (See Comments)    Causes hallucinations.   . Codeine     hallucinations    Patient Measurements: Height: 5\' 11"  (180.3 cm) Weight: 201 lb (91.173 kg) IBW/kg (Calculated) : 75.3 Heparin Dosing Weight: 91 kg  Vital Signs: Temp: 98 F (36.7 C) (06/06 0500) BP: 110/58 mmHg (06/06 0500) Pulse Rate: 77 (06/06 0500)  Labs:  Recent Labs  10/05/13 1350 10/05/13 1717 10/05/13 2250 10/06/13 0442  HGB 15.7  --   --  14.6  HCT 44.8  --   --  42.8  PLT 185  --   --  181  HEPARINUNFRC  --   --   --  0.50  CREATININE 1.26  --   --  1.43*  TROPONINI  --  0.88* 0.58* 0.69*   Estimated Creatinine Clearance: 53.2 ml/min (by C-G formula based on Cr of 1.43).  Medical History: Past Medical History  Diagnosis Date  . Hypertension   . Renal cell carcinoma     a. s/p nephrectomy  . Coronary artery disease     a. s/p MI with stent in 2000  . Hyperlipidemia   . Shortness of breath     a. related to chemo drugs  . Anemia   . GERD (gastroesophageal reflux disease)   . Diverticulosis   . PAF (paroxysmal atrial fibrillation)   . Myocardial infarction 1997  . Pneumonia ~ 2007  . Type II diabetes mellitus     "treating w/diet and weight loss"  . History of blood transfusion     "w/nephrectomy; w/shoulder replacement; w/knee replacement"  . Arthritis     "fingers, toes" (10/05/2013)  . Left knee DJD    Medications:  Prescriptions prior to admission  Medication Sig Dispense Refill  . aspirin 325 MG EC tablet Take 325 mg by mouth daily.      Marland Kitchen atorvastatin (LIPITOR) 10 MG tablet Take 10 mg by mouth daily with breakfast. Patient is presently holding this medication til the second week in January per doctor instructions.      Marland Kitchen azelastine (ASTELIN) 137 MCG/SPRAY nasal spray Place 1 spray  into the nose 2 (two) times daily as needed. For congestion; Use in each nostril as directed      . cholecalciferol (VITAMIN D) 1000 UNITS tablet Take 1,000 Units by mouth daily.      . Coenzyme Q10 (COQ10) 100 MG CAPS Take 100 mg by mouth daily.      . cyanocobalamin 100 MCG tablet Take 100 mcg by mouth daily.      Marland Kitchen EPINEPHrine (EPI-PEN) 0.3 mg/0.3 mL SOAJ Inject 0.3 mg into the muscle once.      . fexofenadine (ALLEGRA) 180 MG tablet Take 180 mg by mouth daily with breakfast. Pt takes allegra for 30 days then zyrtec for 30 days      . fluticasone (FLONASE) 50 MCG/ACT nasal spray Place 2 sprays into the nose daily as needed for allergies. Do not use while on "IL2"      . lisinopril (PRINIVIL,ZESTRIL) 20 MG tablet Take 20 mg by mouth daily. For raised BP while on "IL2", if b/p low per pt only takes one per day per MD      . magnesium gluconate (MAGONATE) 500 MG tablet Take 500 mg by mouth 2 (two) times daily.       Marland Kitchen  Multiple Vitamin (MULTIVITAMIN WITH MINERALS) TABS Take 1 tablet by mouth daily.      . nitroGLYCERIN (NITROSTAT) 0.4 MG SL tablet Place 0.4 mg under the tongue every 5 (five) minutes as needed for chest pain.      . Omega-3 Fatty Acids (FISH OIL) 1200 MG CAPS Take 1,200 mg by mouth daily.      Vladimir Faster Glycol-Propyl Glycol (SYSTANE) 0.4-0.3 % SOLN Place 1 drop into both eyes daily as needed (dry eyes).      . ranitidine (ZANTAC) 75 MG tablet Take 150 mg by mouth daily as needed for heartburn.       . vitamin C (ASCORBIC ACID) 500 MG tablet Take 500 mg by mouth daily.        Assessment: 73yo male admitted with palpitations.  He has a past medical history of CAD and other co-morbidities noted above.  Rivaroxaban was ordered but was not given upon discussion with his nurse.  Changing to lovenox today then eventually to xarelto.   Goal of Therapy:  Anti-Xa = 0.6-1.2 Monitor platelets by anticoagulation protocol: Yes   Plan:   Dc heparin  Lovenox 90mg  SQ q12 CBC q3days F/u  with starting Xarelto

## 2013-10-06 NOTE — Progress Notes (Signed)
       Patient Name: Donald Gray Date of Encounter: 10/06/2013    SUBJECTIVE: [name] is well known to me. He had remote MI. He had cath at [facility] by [facility] in 2011 that revealed non obstructive disease. He had a low risk nuclear last fall at [facility]. Presents now with A fib and noted irregular rhythm several days prior to admission,. He denies chest p[ain despite the elevated troponins.  TELEMETRY:   A fib with controlled rate Filed Vitals:   10/05/13 1835 10/05/13 2100 10/06/13 0500 10/06/13 1011  BP: 132/82 105/52 110/58 106/69  Pulse: 88 102 77 76  Temp: 98.1 F (36.7 C) 97.9 F (36.6 C) 98 F (36.7 C)   TempSrc: Oral     Resp: 16 18 18    Height: 5\' 11"  (1.803 m)     Weight: 200 lb (90.719 kg)  201 lb (91.173 kg)   SpO2: 99% 97% 99%     Intake/Output Summary (Last 24 hours) at 10/06/13 1022 Last data filed at 10/06/13 0900  Gross per 24 hour  Intake    480 ml  Output    300 ml  Net    180 ml   LABS: Basic Metabolic Panel:  Recent Labs  10/05/13 1350 10/05/13 1717 10/06/13 0442  NA 138  --  142  K 4.6  --  5.4*  CL 102  --  107  CO2 22  --  24  GLUCOSE 130*  --  102*  BUN 26*  --  27*  CREATININE 1.26  --  1.43*  CALCIUM 9.4  --  9.2  MG  --  2.1  --    CBC:  Recent Labs  10/05/13 1350 10/06/13 0442  WBC 6.3 6.2  HGB 15.7 14.6  HCT 44.8 42.8  MCV 87.7 88.1  PLT 185 181   Cardiac Enzymes:  Recent Labs  10/05/13 1717 10/05/13 2250 10/06/13 0442  TROPONINI 0.88* 0.58* 0.69*    Fasting Lipid Panel:  Recent Labs  10/06/13 0442  CHOL 134  HDL 43  LDLCALC 76  TRIG 73  CHOLHDL 3.1   ECG: non - ischemic with AF and controlled rate.  Radiology/Studies:  No CHF on CXR  Physical Exam: Blood pressure 106/69, pulse 76, temperature 98 F (36.7 C), temperature source Oral, resp. rate 18, height 5\' 11"  (1.803 m), weight 201 lb (91.173 kg), SpO2 99.00%. Weight change:   Wt Readings from Last 3 Encounters:  10/06/13 201  lb (91.173 kg)  05/18/13 203 lb (92.08 kg)  05/04/13 207 lb 8 oz (94.121 kg)    IIRR but no murmur or gallop.  ASSESSMENT:  1. A fib of unknown duration. Better rate control today 2. Elevated troponin, due to demand ischemia rather than ACS 3. CAD with low risk nuclear within past year  Plan:  1. Convert to lovenox then to Xarelto 2. Control rate 3.  Ambulate 4. Possibly home is AM if asymptomatic 5. Cardioversion is 4 weeks. 6. If anginal complaints, will consider OP cath.    Signed, Belva Crome III 10/06/2013, 10:22 AM

## 2013-10-07 ENCOUNTER — Encounter (HOSPITAL_COMMUNITY): Payer: Self-pay | Admitting: Cardiology

## 2013-10-07 DIAGNOSIS — I214 Non-ST elevation (NSTEMI) myocardial infarction: Secondary | ICD-10-CM | POA: Diagnosis present

## 2013-10-07 DIAGNOSIS — Z7901 Long term (current) use of anticoagulants: Secondary | ICD-10-CM

## 2013-10-07 DIAGNOSIS — I4891 Unspecified atrial fibrillation: Secondary | ICD-10-CM | POA: Diagnosis present

## 2013-10-07 HISTORY — DX: Non-ST elevation (NSTEMI) myocardial infarction: I21.4

## 2013-10-07 HISTORY — DX: Long term (current) use of anticoagulants: Z79.01

## 2013-10-07 LAB — BASIC METABOLIC PANEL
BUN: 27 mg/dL — AB (ref 6–23)
CHLORIDE: 104 meq/L (ref 96–112)
CO2: 22 meq/L (ref 19–32)
Calcium: 9 mg/dL (ref 8.4–10.5)
Creatinine, Ser: 1.3 mg/dL (ref 0.50–1.35)
GFR calc Af Amer: 61 mL/min — ABNORMAL LOW (ref >90)
GFR calc non Af Amer: 53 mL/min — ABNORMAL LOW (ref >90)
Glucose, Bld: 155 mg/dL — ABNORMAL HIGH (ref 70–99)
Potassium: 4.7 mEq/L (ref 3.7–5.3)
Sodium: 140 mEq/L (ref 137–147)

## 2013-10-07 LAB — CBC
HCT: 41.4 % (ref 39.0–52.0)
Hemoglobin: 13.8 g/dL (ref 13.0–17.0)
MCH: 29.2 pg (ref 26.0–34.0)
MCHC: 33.3 g/dL (ref 30.0–36.0)
MCV: 87.7 fL (ref 78.0–100.0)
Platelets: 173 10*3/uL (ref 150–400)
RBC: 4.72 MIL/uL (ref 4.22–5.81)
RDW: 14.7 % (ref 11.5–15.5)
WBC: 6.6 10*3/uL (ref 4.0–10.5)

## 2013-10-07 MED ORDER — RIVAROXABAN 20 MG PO TABS
20.0000 mg | ORAL_TABLET | Freq: Every day | ORAL | Status: DC
  Administered 2013-10-07 – 2013-10-08 (×2): 20 mg via ORAL
  Filled 2013-10-07 (×4): qty 1

## 2013-10-07 MED ORDER — SODIUM CHLORIDE 0.9 % IV SOLN
INTRAVENOUS | Status: DC
  Administered 2013-10-08: 12:00:00 via INTRAVENOUS

## 2013-10-07 NOTE — Progress Notes (Signed)
ANTICOAGULATION CONSULT NOTE - Initial Consult  Pharmacy Consult for xarelto Indication: atrial fibrillation  Allergies  Allergen Reactions  . Penicillins Swelling  . Ambien [Zolpidem Tartrate] Other (See Comments)    Causes hallucinations.   . Codeine     hallucinations    Patient Measurements: Height: 5\' 11"  (180.3 cm) Weight: 203 lb (92.08 kg) IBW/kg (Calculated) : 75.3 Heparin Dosing Weight: 91 kg  Vital Signs: Temp: 97.8 F (36.6 C) (06/07 0500) BP: 102/64 mmHg (06/07 0500) Pulse Rate: 77 (06/07 0500)  Labs:  Recent Labs  10/05/13 1350 10/05/13 1717 10/05/13 2250 10/06/13 0442 10/07/13 0403  HGB 15.7  --   --  14.6 13.8  HCT 44.8  --   --  42.8 41.4  PLT 185  --   --  181 173  HEPARINUNFRC  --   --   --  0.50  --   CREATININE 1.26  --   --  1.43*  --   TROPONINI  --  0.88* 0.58* 0.69*  --    Estimated Creatinine Clearance: 53.4 ml/min (by C-G formula based on Cr of 1.43).  Medical History: Past Medical History  Diagnosis Date  . Hypertension   . Renal cell carcinoma     a. s/p nephrectomy  . Coronary artery disease     a. s/p MI with stent in 2000  . Hyperlipidemia   . Shortness of breath     a. related to chemo drugs  . Anemia   . GERD (gastroesophageal reflux disease)   . Diverticulosis   . PAF (paroxysmal atrial fibrillation)   . Myocardial infarction 1997  . Pneumonia ~ 2007  . Type II diabetes mellitus     "treating w/diet and weight loss"  . History of blood transfusion     "w/nephrectomy; w/shoulder replacement; w/knee replacement"  . Arthritis     "fingers, toes" (10/05/2013)  . Left knee DJD   . Anticoagulated- to go to Xarelto for a fib 10/07/2013  . NSTEMI (non-ST elevated myocardial infarction), type 2 due to demand ischemia 10/07/2013   Medications:  Prescriptions prior to admission  Medication Sig Dispense Refill  . aspirin 325 MG EC tablet Take 325 mg by mouth daily.      Marland Kitchen atorvastatin (LIPITOR) 10 MG tablet Take 10 mg by mouth  daily with breakfast. Patient is presently holding this medication til the second week in January per doctor instructions.      Marland Kitchen azelastine (ASTELIN) 137 MCG/SPRAY nasal spray Place 1 spray into the nose 2 (two) times daily as needed. For congestion; Use in each nostril as directed      . cholecalciferol (VITAMIN D) 1000 UNITS tablet Take 1,000 Units by mouth daily.      . Coenzyme Q10 (COQ10) 100 MG CAPS Take 100 mg by mouth daily.      . cyanocobalamin 100 MCG tablet Take 100 mcg by mouth daily.      Marland Kitchen EPINEPHrine (EPI-PEN) 0.3 mg/0.3 mL SOAJ Inject 0.3 mg into the muscle once.      . fexofenadine (ALLEGRA) 180 MG tablet Take 180 mg by mouth daily with breakfast. Pt takes allegra for 30 days then zyrtec for 30 days      . fluticasone (FLONASE) 50 MCG/ACT nasal spray Place 2 sprays into the nose daily as needed for allergies. Do not use while on "IL2"      . lisinopril (PRINIVIL,ZESTRIL) 20 MG tablet Take 20 mg by mouth daily. For raised BP while on "IL2",  if b/p low per pt only takes one per day per MD      . magnesium gluconate (MAGONATE) 500 MG tablet Take 500 mg by mouth 2 (two) times daily.       . Multiple Vitamin (MULTIVITAMIN WITH MINERALS) TABS Take 1 tablet by mouth daily.      . nitroGLYCERIN (NITROSTAT) 0.4 MG SL tablet Place 0.4 mg under the tongue every 5 (five) minutes as needed for chest pain.      . Omega-3 Fatty Acids (FISH OIL) 1200 MG CAPS Take 1,200 mg by mouth daily.      Vladimir Faster Glycol-Propyl Glycol (SYSTANE) 0.4-0.3 % SOLN Place 1 drop into both eyes daily as needed (dry eyes).      . ranitidine (ZANTAC) 75 MG tablet Take 150 mg by mouth daily as needed for heartburn.       . vitamin C (ASCORBIC ACID) 500 MG tablet Take 500 mg by mouth daily.        Assessment: 73yo male admitted with palpitations.  He has a past medical history of CAD and other co-morbidities noted above.  Xarelto ordered today for anticoagulation. Last dose of lovenox was last PM. We'll start xarelto  today.   Goal of Therapy:  Therapeutic anticoagulation Monitor platelets by anticoagulation protocol: Yes   Plan:   Dc lovenox Xarelto 20mg  PO qday

## 2013-10-07 NOTE — Progress Notes (Signed)
Donald Gray is well known to Dr. Tamala Julian. He had remote MI. He had cath at [facility] by [facility] in 2011 that revealed non obstructive disease. He had a low risk nuclear last fall at [facility].  Presents now with A fib and noted irregular rhythm several days prior to admission,. He denies chest p[ain despite the elevated troponins    Subjective: No chest pain,  Moderate dyspnea on exertion.  Objective: Vital signs in last 24 hours: Temp:  [97.4 F (36.3 C)-97.9 F (36.6 C)] 97.8 F (36.6 C) (06/07 0500) Pulse Rate:  [76-88] 77 (06/07 0500) Resp:  [16-20] 16 (06/07 0500) BP: (102-125)/(60-70) 102/64 mmHg (06/07 0500) SpO2:  [97 %-98 %] 98 % (06/07 0500) Weight:  [203 lb (92.08 kg)] 203 lb (92.08 kg) (06/07 0500) Weight change: 1 lb (0.454 kg) Last BM Date: 10/06/13 Intake/Output from previous day: +420 wt 203 06/06 0701 - 06/07 0700 In: 720 [P.O.:720] Out: 300 [Urine:300] Intake/Output this shift:    PE: General:Pleasant affect, NAD, concerned that he was an observation, pt admitted 2 days ago will change to inpt status.   Skin:Warm and dry, brisk capillary refill HEENT:normocephalic, sclera clear, mucus membranes moist Neck:supple, no JVD  Heart:irreg irreg  without murmur, gallup, rub or click Lungs:clear without rales, rhonchi, or wheezes WUX:LKGM, non tender, + BS, do not palpate liver spleen or masses Ext:no lower ext edema, 2+ pedal pulses, 2+ radial pulses Neuro:alert and oriented, MAE, follows commands, + facial symmetry   Lab Results:  Recent Labs  10/06/13 0442 10/07/13 0403  WBC 6.2 6.6  HGB 14.6 13.8  HCT 42.8 41.4  PLT 181 173   BMET  Recent Labs  10/05/13 1350 10/06/13 0442  NA 138 142  K 4.6 5.4*  CL 102 107  CO2 22 24  GLUCOSE 130* 102*  BUN 26* 27*  CREATININE 1.26 1.43*  CALCIUM 9.4 9.2    Recent Labs  10/05/13 2250 10/06/13 0442  TROPONINI 0.58* 0.69*    Lab Results  Component Value Date   CHOL 134 10/06/2013   HDL  43 10/06/2013   LDLCALC 76 10/06/2013   TRIG 73 10/06/2013   CHOLHDL 3.1 10/06/2013   Lab Results  Component Value Date   HGBA1C 6.4* 10/06/2013     Lab Results  Component Value Date   TSH 1.550 10/05/2013    Hepatic Function Panel No results found for this basename: PROT, ALBUMIN, AST, ALT, ALKPHOS, BILITOT, BILIDIR, IBILI,  in the last 72 hours  Recent Labs  10/06/13 0442  CHOL 134   No results found for this basename: PROTIME,  in the last 72 hours     Studies/Results: Dg Chest Port 1 View  10/05/2013   CLINICAL DATA:  Atrial fibrillation.  EXAM: PORTABLE CHEST - 1 VIEW  COMPARISON:  November 07, 2012.  FINDINGS: The heart size and mediastinal contours are within normal limits. Both lungs are clear. No pneumothorax or pleural effusion is noted. Status post right shoulder arthroplasty.  IMPRESSION: No acute cardiopulmonary abnormality seen.   Electronically Signed   By: Sabino Dick M.D.   On: 10/05/2013 14:14   2 D Echo: Left ventricle: The cavity size was normal. Systolic function was normal. The estimated ejection fraction was in the range of 50% to 55%. Wall motion was normal; there were no regional wall motion abnormalities. - Left atrium: The atrium was mildly dilated. - Right ventricle: False tendon in RV apex of no clinical significance.   Medications:  I have reviewed the patient's current medications. Scheduled Meds: . aspirin  325 mg Oral Daily  . atorvastatin  10 mg Oral Q breakfast  . cholecalciferol  1,000 Units Oral Daily  . enoxaparin (LOVENOX) injection  1 mg/kg Subcutaneous Q12H  . famotidine  10 mg Oral Daily  . lisinopril  20 mg Oral Daily  . loratadine  10 mg Oral Daily  . magnesium gluconate  500 mg Oral BID  . metoprolol tartrate  25 mg Oral BID  . omega-3 acid ethyl esters  2 g Oral Daily  . vitamin C  500 mg Oral Daily   Continuous Infusions:  PRN Meds:.fluticasone, nitroGLYCERIN  Assessment/Plan: Principal Problem:   PAF (paroxysmal atrial  fibrillation)- rate controlled at rest but HR up to 125bpm with hall ambulation. Given ongoing symptoms, we will plan TEE cardioversion for tomorrow Active Problems:   Coronary artery disease-- no chest pain, occ SOB. Trivial marker elevation   NSTEMI (non-ST elevated myocardial infarction), type 2 due to demand ischemia pk troponin 0.88   Anticoagulated- continue lovenox. Convert to Eliquis or Xarelto on discharge.   Obesity   Diabetes- glucose low in hosp.   Arthritis   Hyperlipidemia   Hypertension- with addition of BB his BP is lower, along with increased Cr. Will stop lisinopril    Renal cell carcinoma   Atrial fibrillation   K+ 5.4-recheck today   CKD  Cr 1.43 recheck today, with increase since admission related to ? Decreased cardiac output.   LOS: 2 days   Time spent with pt. : 15 minutes. Cecilie Kicks  Nurse Practitioner Certified Pager [phone] or after 5pm and on weekends call [phone] 10/07/2013, 7:48 AM  '

## 2013-10-08 ENCOUNTER — Encounter (HOSPITAL_COMMUNITY): Payer: Medicare PPO | Admitting: Anesthesiology

## 2013-10-08 ENCOUNTER — Encounter (HOSPITAL_COMMUNITY)
Admission: EM | Disposition: A | Discharge status: Home / Self Care | Payer: Medicare PPO | Source: Home / Self Care | Attending: Internal Medicine

## 2013-10-08 ENCOUNTER — Encounter (HOSPITAL_COMMUNITY): Payer: Self-pay

## 2013-10-08 ENCOUNTER — Inpatient Hospital Stay (HOSPITAL_COMMUNITY): Payer: Medicare PPO | Admitting: Anesthesiology

## 2013-10-08 DIAGNOSIS — I4891 Unspecified atrial fibrillation: Secondary | ICD-10-CM

## 2013-10-08 DIAGNOSIS — I059 Rheumatic mitral valve disease, unspecified: Secondary | ICD-10-CM

## 2013-10-08 HISTORY — PX: CARDIOVERSION: SHX1299

## 2013-10-08 HISTORY — PX: TEE WITHOUT CARDIOVERSION: SHX5443

## 2013-10-08 LAB — BASIC METABOLIC PANEL
BUN: 26 mg/dL — ABNORMAL HIGH (ref 6–23)
CO2: 25 meq/L (ref 19–32)
CREATININE: 1.36 mg/dL — AB (ref 0.50–1.35)
Calcium: 9.1 mg/dL (ref 8.4–10.5)
Chloride: 106 mEq/L (ref 96–112)
GFR calc Af Amer: 58 mL/min — ABNORMAL LOW (ref >90)
GFR calc non Af Amer: 50 mL/min — ABNORMAL LOW (ref >90)
GLUCOSE: 99 mg/dL (ref 70–99)
Potassium: 5 mEq/L (ref 3.7–5.3)
Sodium: 142 mEq/L (ref 137–147)

## 2013-10-08 LAB — PROTIME-INR
INR: 1.26 (ref 0.00–1.49)
Prothrombin Time: 15.5 seconds — ABNORMAL HIGH (ref 11.6–15.2)

## 2013-10-08 SURGERY — ECHOCARDIOGRAM, TRANSESOPHAGEAL
Anesthesia: General

## 2013-10-08 MED ORDER — PROPOFOL 10 MG/ML IV BOLUS
INTRAVENOUS | Status: DC | PRN
  Administered 2013-10-08: 50 mg via INTRAVENOUS

## 2013-10-08 MED ORDER — MIDAZOLAM HCL 5 MG/ML IJ SOLN
INTRAMUSCULAR | Status: AC
  Filled 2013-10-08: qty 2

## 2013-10-08 MED ORDER — ADULT MULTIVITAMIN W/MINERALS CH
1.0000 | ORAL_TABLET | Freq: Every day | ORAL | Status: DC
  Administered 2013-10-08 – 2013-10-09 (×2): 1 via ORAL
  Filled 2013-10-08 (×2): qty 1

## 2013-10-08 MED ORDER — BUTAMBEN-TETRACAINE-BENZOCAINE 2-2-14 % EX AERO
INHALATION_SPRAY | CUTANEOUS | Status: DC | PRN
  Administered 2013-10-08: 2 via TOPICAL

## 2013-10-08 MED ORDER — FENTANYL CITRATE 0.05 MG/ML IJ SOLN
INTRAMUSCULAR | Status: AC
  Filled 2013-10-08: qty 2

## 2013-10-08 MED ORDER — MIDAZOLAM HCL 10 MG/2ML IJ SOLN
INTRAMUSCULAR | Status: DC | PRN
  Administered 2013-10-08 (×2): 2 mg via INTRAVENOUS

## 2013-10-08 MED ORDER — FENTANYL CITRATE 0.05 MG/ML IJ SOLN
INTRAMUSCULAR | Status: DC | PRN
  Administered 2013-10-08 (×2): 25 ug via INTRAVENOUS

## 2013-10-08 NOTE — Anesthesia Preprocedure Evaluation (Addendum)
Anesthesia Evaluation  Patient identified by MRN, date of birth, ID band Patient awake    Reviewed: Allergy & Precautions, H&P , NPO status , Patient's Chart, lab work & pertinent test results  History of Anesthesia Complications Negative for: history of anesthetic complications  Airway Mallampati: III TM Distance: >3 FB Neck ROM: Full    Dental  (+) Teeth Intact, Dental Advisory Given   Pulmonary shortness of breath, former smoker,    Pulmonary exam normal       Cardiovascular hypertension, Pt. on medications and Pt. on home beta blockers + CAD and + Past MI Rhythm:Irregular     Neuro/Psych negative neurological ROS  negative psych ROS   GI/Hepatic GERD-  Medicated,  Endo/Other  diabetes  Renal/GU Renal cell Ca     Musculoskeletal   Abdominal   Peds  Hematology   Anesthesia Other Findings   Reproductive/Obstetrics                          Anesthesia Physical Anesthesia Plan  ASA: III  Anesthesia Plan: General   Post-op Pain Management:    Induction: Intravenous  Airway Management Planned: Mask  Additional Equipment:   Intra-op Plan:   Post-operative Plan:   Informed Consent: I have reviewed the patients History and Physical, chart, labs and discussed the procedure including the risks, benefits and alternatives for the proposed anesthesia with the patient or authorized representative who has indicated his/her understanding and acceptance.   Dental advisory given  Plan Discussed with: CRNA, Anesthesiologist and Surgeon  Anesthesia Plan Comments:         Anesthesia Quick Evaluation

## 2013-10-08 NOTE — Progress Notes (Signed)
  Echocardiogram Echocardiogram Transesophageal has been performed.  Basilia Jumbo 10/08/2013, 1:32 PM

## 2013-10-08 NOTE — Anesthesia Postprocedure Evaluation (Signed)
  Anesthesia Post-op Note  Patient: Donald Gray  Procedure(s) Performed: Procedure(s): TRANSESOPHAGEAL ECHOCARDIOGRAM (TEE) (N/A) CARDIOVERSION (N/A)  Patient Location: Endoscopy Unit  Anesthesia Type:General  Level of Consciousness: awake, alert  and oriented  Airway and Oxygen Therapy: Patient Spontanous Breathing and Patient connected to nasal cannula oxygen  Post-op Pain: none  Post-op Assessment: Post-op Vital signs reviewed, Patient's Cardiovascular Status Stable, Respiratory Function Stable, Patent Airway, No signs of Nausea or vomiting and Pain level controlled  Post-op Vital Signs: Reviewed and stable  Last Vitals:  Filed Vitals:   10/08/13 1330  BP: 112/73  Pulse: 84  Temp:   Resp: 18    Complications: No apparent anesthesia complications

## 2013-10-08 NOTE — Progress Notes (Signed)
       Patient Name: Donald Gray Date of Encounter: 10/08/2013    SUBJECTIVE:Dyspnea on exertion. No orthopnea or chest pain.  TELEMETRY:  A fib with moderate rate control. Filed Vitals:   10/07/13 1009 10/07/13 1400 10/07/13 2100 10/08/13 0602  BP: 100/72 97/53 106/77 95/55  Pulse: 110 84 84 88  Temp:  97.7 F (36.5 C) 98.3 F (36.8 C) 97.7 F (36.5 C)  TempSrc:  Oral    Resp:  16 18 16   Height:      Weight:      SpO2:  100% 99% 99%    Intake/Output Summary (Last 24 hours) at 10/08/13 0837 Last data filed at 10/07/13 2100  Gross per 24 hour  Intake   1320 ml  Output      0 ml  Net   1320 ml   LABS: Basic Metabolic Panel:  Recent Labs  10/05/13 1350 10/05/13 1717  10/07/13 0830 10/08/13 0315  NA 138  --   < > 140 142  K 4.6  --   < > 4.7 5.0  CL 102  --   < > 104 106  CO2 22  --   < > 22 25  GLUCOSE 130*  --   < > 155* 99  BUN 26*  --   < > 27* 26*  CREATININE 1.26  --   < > 1.30 1.36*  CALCIUM 9.4  --   < > 9.0 9.1  MG  --  2.1  --   --   --   < > = values in this interval not displayed. CBC:  Recent Labs  10/06/13 0442 10/07/13 0403  WBC 6.2 6.6  HGB 14.6 13.8  HCT 42.8 41.4  MCV 88.1 87.7  PLT 181 173   Cardiac Enzymes:  Recent Labs  10/05/13 1717 10/05/13 2250 10/06/13 0442  TROPONINI 0.88* 0.58* 0.69*   BNP: No components found with this basename: POCBNP,  Hemoglobin A1C:  Recent Labs  10/06/13 0442  HGBA1C 6.4*   Fasting Lipid Panel:  Recent Labs  10/06/13 0442  CHOL 134  HDL 43  LDLCALC 76  TRIG 73  CHOLHDL 3.1   ECHO: Study Conclusions  - Left ventricle: The cavity size was normal. Systolic function was normal. The estimated ejection fraction was in the range of 50% to 55%. Wall motion was normal; there were no regional wall motion abnormalities. - Left atrium: The atrium was mildly dilated. - Right ventricle: False tendon in RV apex of no clinical significance.   Radiology/Studies:  No new  data  Physical Exam: Blood pressure 95/55, pulse 88, temperature 97.7 F (36.5 C), temperature source Oral, resp. rate 16, height 5\' 11"  (1.803 m), weight 203 lb (92.08 kg), SpO2 99.00%. Weight change:   Wt Readings from Last 3 Encounters:  10/07/13 203 lb (92.08 kg)  10/07/13 203 lb (92.08 kg)  05/18/13 203 lb (92.08 kg)   Neck: flat jugulars Cardiac: IIRR Chest: clear  ASSESSMENT:  1. A fib with poor rate control and soft BP on meds to control rate. 2. Elevated troponins felt due to demand not ACS. 3. CKD, stage 2 4. CAD assymptomatic   Plan:  1. TEE cardioversion today and if done early, home this evening. 2. Switch Enoxaparin to Xarelto after cardioversion.  Signed, Belva Crome III 10/08/2013, 8:37 AM

## 2013-10-08 NOTE — H&P (View-Only) (Signed)
       Patient Name: Donald Gray Date of Encounter: 10/08/2013    SUBJECTIVE:Dyspnea on exertion. No orthopnea or chest pain.  TELEMETRY:  A fib with moderate rate control. Filed Vitals:   10/07/13 1009 10/07/13 1400 10/07/13 2100 10/08/13 0602  BP: 100/72 97/53 106/77 95/55  Pulse: 110 84 84 88  Temp:  97.7 F (36.5 C) 98.3 F (36.8 C) 97.7 F (36.5 C)  TempSrc:  Oral    Resp:  16 18 16   Height:      Weight:      SpO2:  100% 99% 99%    Intake/Output Summary (Last 24 hours) at 10/08/13 0837 Last data filed at 10/07/13 2100  Gross per 24 hour  Intake   1320 ml  Output      0 ml  Net   1320 ml   LABS: Basic Metabolic Panel:  Recent Labs  10/05/13 1350 10/05/13 1717  10/07/13 0830 10/08/13 0315  NA 138  --   < > 140 142  K 4.6  --   < > 4.7 5.0  CL 102  --   < > 104 106  CO2 22  --   < > 22 25  GLUCOSE 130*  --   < > 155* 99  BUN 26*  --   < > 27* 26*  CREATININE 1.26  --   < > 1.30 1.36*  CALCIUM 9.4  --   < > 9.0 9.1  MG  --  2.1  --   --   --   < > = values in this interval not displayed. CBC:  Recent Labs  10/06/13 0442 10/07/13 0403  WBC 6.2 6.6  HGB 14.6 13.8  HCT 42.8 41.4  MCV 88.1 87.7  PLT 181 173   Cardiac Enzymes:  Recent Labs  10/05/13 1717 10/05/13 2250 10/06/13 0442  TROPONINI 0.88* 0.58* 0.69*   BNP: No components found with this basename: POCBNP,  Hemoglobin A1C:  Recent Labs  10/06/13 0442  HGBA1C 6.4*   Fasting Lipid Panel:  Recent Labs  10/06/13 0442  CHOL 134  HDL 43  LDLCALC 76  TRIG 73  CHOLHDL 3.1   ECHO: Study Conclusions  - Left ventricle: The cavity size was normal. Systolic function was normal. The estimated ejection fraction was in the range of 50% to 55%. Wall motion was normal; there were no regional wall motion abnormalities. - Left atrium: The atrium was mildly dilated. - Right ventricle: False tendon in RV apex of no clinical significance.   Radiology/Studies:  No new  data  Physical Exam: Blood pressure 95/55, pulse 88, temperature 97.7 F (36.5 C), temperature source Oral, resp. rate 16, height 5\' 11"  (1.803 m), weight 203 lb (92.08 kg), SpO2 99.00%. Weight change:   Wt Readings from Last 3 Encounters:  10/07/13 203 lb (92.08 kg)  10/07/13 203 lb (92.08 kg)  05/18/13 203 lb (92.08 kg)   Neck: flat jugulars Cardiac: IIRR Chest: clear  ASSESSMENT:  1. A fib with poor rate control and soft BP on meds to control rate. 2. Elevated troponins felt due to demand not ACS. 3. CKD, stage 2 4. CAD assymptomatic   Plan:  1. TEE cardioversion today and if done early, home this evening. 2. Switch Enoxaparin to Xarelto after cardioversion.  Signed, Belva Crome III 10/08/2013, 8:37 AM

## 2013-10-08 NOTE — CV Procedure (Signed)
   Transesophageal Echocardiogram Note  Donald Gray [id number] [birth year]  Procedure: Transesophageal Echocardiogram Indications: a-fib  Procedure Details Consent: Obtained Time Out: Verified patient identification, verified procedure, site/side was marked, verified correct patient position, special equipment/implants available, Radiology Safety Procedures followed,  medications/allergies/relevent history reviewed, required imaging and test results available.  Performed  Medications: Fentanyl: 50 mcg Versed: 4 mg  Left Ventrical:  The cavity size was normal. Systolic function was normal. The estimated ejection fraction was in the range of 50% to 55%. Wall motion was normal; there were no regional wall motion abnormalities.  Mitral Valve: Moderate MR, multiple small jets.   Aortic Valve: Normal, no AI.  Tricuspid Valve: Normal, mild TR.  Pulmonic Valve: Normal, no PR.  Left Atrium/ Left atrial appendage: Dilated LA, no thrombus in the LA or LAA. Normal emptying and filling velocities.    Atrial septum: Lipomatous hypertrophy of the interatrial septum.  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    Complications: No apparent complications Patient did tolerate procedure well.   Cardioversion Note  Donald Gray [id number] 24-Nov-1940  Procedure: DC Cardioversion Indications: a-fib  Procedure Details Consent: Obtained Time Out: Verified patient identification, verified procedure, site/side was marked, verified correct patient position, special equipment/implants available, Radiology Safety Procedures followed,  medications/allergies/relevent history reviewed, required imaging and test results available.  Performed  The patient has been on adequate anticoagulation.  The patient received IV Propofol by anesthesia staff for sedation.  Synchronous cardioversion was performed at 150 joules.  The cardioversion was  successful.   Complications: No apparent complications Patient did tolerate procedure well.   Dorothy Spark, MD, [facility] 10/08/2013, 12:25 PM

## 2013-10-08 NOTE — Care Management Note (Signed)
    Page 1 of 1   10/09/2013     10:32:07 AM CARE MANAGEMENT NOTE 10/09/2013  Patient:  Donald Gray, Donald Gray   Account Number:  [id number]  Date Initiated:  10/08/2013  Documentation initiated by:  GRAVES-BIGELOW,Luvinia Lucy  Subjective/Objective Assessment:   Pt admitted for afib.     Action/Plan:   CM did benefits check for xarelto. Pt will need Rx for 30 day free no refills and the original Rx with refills. CM will provide pt with 30 day free card.   Anticipated DC Date:  10/09/2013   Anticipated DC Plan:  [place]  CM consult  Medication Assistance      Choice offered to / List presented to:             Status of service:  In process, will continue to follow Medicare Important Message given?  YES (If response is "NO", the following Medicare IM given date fields will be blank) Date Medicare IM given:  10/09/2013 Date Additional Medicare IM given:    Discharge Disposition:  HOME/SELF CARE  Per UR Regulation:  Reviewed for med. necessity/level of care/duration of stay  If discussed at [place] of Stay Meetings, dates discussed:    Comments:   10-09-13 [place], RN,BSN [phone] IM signed and given to pt- copy placed on shadow chart.   xarelto- covered, no auth, tier 3, $45 for 30 day retail, $125 for 90 day mail order patient can use most major retail pharmacies

## 2013-10-08 NOTE — Transfer of Care (Signed)
Immediate Anesthesia Transfer of Care Note  Patient: Donald Gray  Procedure(s) Performed: Procedure(s): TRANSESOPHAGEAL ECHOCARDIOGRAM (TEE) (N/A) CARDIOVERSION (N/A)  Patient Location: Endoscopy Unit  Anesthesia Type:General  Level of Consciousness: awake, alert  and oriented  Airway & Oxygen Therapy: Patient Spontanous Breathing and Patient connected to nasal cannula oxygen  Post-op Assessment: Report given to PACU RN  Post vital signs: Reviewed and stable  Complications: No apparent anesthesia complications

## 2013-10-08 NOTE — Interval H&P Note (Signed)
History and Physical Interval Note:  10/08/2013 12:25 PM  Donald Gray  has presented today for surgery, with the diagnosis of afib   The various methods of treatment have been discussed with the patient and family. After consideration of risks, benefits and other options for treatment, the patient has consented to  Procedure(s): TRANSESOPHAGEAL ECHOCARDIOGRAM (TEE) (N/A) CARDIOVERSION (N/A) as a surgical intervention .  The patient's history has been reviewed, patient examined, no change in status, stable for surgery.  I have reviewed the patient's chart and labs.  Questions were answered to the patient's satisfaction.     Dorothy Spark

## 2013-10-09 ENCOUNTER — Encounter (HOSPITAL_COMMUNITY): Payer: Self-pay | Admitting: Cardiology

## 2013-10-09 DIAGNOSIS — I214 Non-ST elevation (NSTEMI) myocardial infarction: Secondary | ICD-10-CM

## 2013-10-09 DIAGNOSIS — I5031 Acute diastolic (congestive) heart failure: Secondary | ICD-10-CM

## 2013-10-09 MED ORDER — METOPROLOL TARTRATE 25 MG PO TABS: 25.0000 mg | ORAL_TABLET | Freq: Two times a day (BID) | ORAL | Status: DC

## 2013-10-09 MED ORDER — RIVAROXABAN 20 MG PO TABS: 20.0000 mg | ORAL_TABLET | Freq: Every day | ORAL | Status: DC

## 2013-10-09 NOTE — Progress Notes (Signed)
See discharge note.

## 2013-10-09 NOTE — Discharge Summary (Signed)
He has maintained NSR overnight. No chest pain or other complaints.  Will d/c aspirin on Xarelto. F/U and if recurrent AF will need antiarrhythmic therapy. OV in 2 weeks with extender and me in 2-3 months.

## 2013-10-09 NOTE — Discharge Summary (Signed)
Physician Discharge Summary       Patient ID: Donald Gray MRN: [id number] DOB/AGE: 1940/11/27 73 y.o.  Admit date: 10/05/2013 Discharge date: 10/09/2013  Discharge Diagnoses:  Principal Problem:   PAF (paroxysmal atrial fibrillation), 10/08/13 TEE and DCCV to SR Active Problems:   Coronary artery disease, hx stent 1995   NSTEMI (non-ST elevated myocardial infarction), type 2 due to demand ischemia   Atrial fibrillation with RVR   Anticoagulated- to go to Xarelto for a fib   Obesity   Diabetes, diet controlled   Arthritis   Hyperlipidemia   Hypertension   Renal cell carcinoma   Atrial fibrillation   Discharged Condition: good  Primary Cardiologist:  Dr. Tamala Gray Procedures: TEE and DCCV 10/08/13 by Dr. Rhona Gray Course: Donald Gray is a 73 y.o. male with a history of asthma, DM, obesity, CAD s/p remote stent (~1995), renal cancer s/p nephrectomy ~4 years ago, in remission (completed immunotherapy clinical trial 2011), GERD and one episode of atrial fibrillation (2006) who was sent from PCPs office to [facility] 10/05/13 for atrial fibrillation with RVR and found to have elevated troponin (0.46).   He has a history of renal cell cancer with pulmonary mets for which he had a nephrectomy approximately 4 years ago and completed therapy with IL-2 in a chemotherapy trial. Prior to undergoing treatment for this [place] which revealed [place] significant lesions. He states that he had a normal nuclear stress test last year. Patient had an episode of atrial fibrillation in 2006 after a vacation in which he ate lots of rich foods and drank many cups of coffee. This lasted only hours and he was placed on Coumadin anticoagulation for 6 months which was later discontinued as the dysrhythmia did not recur. He has had no problems since. He had a cardiopulmonary exercise test for evaluation of chronic dyspnea in 2013 which demonstrated mild functional limitation and some  chronotropic incompetence thought to be related to the patient's beta-blockade. There was no clear cardiac/circulatory limitation. Obesity was felt to be playing a role in his dyspnea. The dyspnea resolved with termination of chemo and BB.   Around 5 PM night prior to admit he noticed feeling weak and diaphoretic. He also noted some fluttering in his chest. His wife took his blood pressure which had SBPs in the 90s and the monitor noted that he was out of rhythm. The next morning he went to his primary care doctor's office to be evaluated and was found to be in atrial fibrillation and was sent to the ED. He denied chest pain, shortness of breath, orthopnea, PND, lightheadedness or nausea/vomiting He did state that he feels weak with moving Denied palpitations all the time. Marland Kitchen He denied recent exertional chest pain or shortness of breath. He denied recent alcohol or caffeine use. No recent fevers, chills, coughs or colds. No blood in the stool or urine.   Pt was admitted po lopressor added with some rate control.  Though pt continued to be symptomatic with SOB while in A fib.  He also developed hypotension and ACE was stopped.  Pt underwent TEE and DCCV which was successful to conversion to SR.  Pt feels much better this AM.  ASA was stopped and pt on Xarelto.   Pt Chads2Vasc score of 4.  Pt also with NSTEMI, type 2 due to demand ischemia.  Stable at discharge. Seen and evaluated by Dr. Tamala Gray.  Copy of Labs and discharge summary given  to pt to take to [facility] to oncologist.    Consults: None  Significant Diagnostic Studies:  BMET    Component Value Date/Time   NA 142 10/08/2013 0315   K 5.0 10/08/2013 0315   CL 106 10/08/2013 0315   CO2 25 10/08/2013 0315   GLUCOSE 99 10/08/2013 0315   BUN 26* 10/08/2013 0315   CREATININE 1.36* 10/08/2013 0315   CALCIUM 9.1 10/08/2013 0315   GFRNONAA 50* 10/08/2013 0315   GFRAA 58* 10/08/2013 0315    CBC    Component Value Date/Time   WBC 6.6 10/07/2013 0403   RBC 4.72  10/07/2013 0403   HGB 13.8 10/07/2013 0403   HCT 41.4 10/07/2013 0403   PLT 173 10/07/2013 0403   MCV 87.7 10/07/2013 0403   MCH 29.2 10/07/2013 0403   MCHC 33.3 10/07/2013 0403   RDW 14.7 10/07/2013 0403   LYMPHSABS 1.8 11/07/2012 1039   MONOABS 0.5 11/07/2012 1039   EOSABS 0.2 11/07/2012 1039   BASOSABS 0.0 11/07/2012 1039   Troponin pk 0.88 trending down to 0.69 prior to discharge.   BNP (last 3 results)  Recent Labs  10/05/13 1717  PROBNP 2582.0*   TSH  1.550  HgB A1C 6.4   2D Echo: Study Conclusions - Left ventricle: The cavity size was normal. Systolic function was normal. The estimated ejection fraction was in the range of 50% to 55%. Wall motion was normal; there were no regional wall motion abnormalities. - Left atrium: The atrium was mildly dilated. - Right ventricle: False tendon in RV apex of no clinical significance.  TEE:  Left Ventrical: The cavity size was normal. Systolic function was normal. The estimated ejection fraction was in the range of 50% to 55%. Wall motion was normal; there were no regional wall motion abnormalities.  Mitral Valve: Moderate MR, multiple small jets.  Aortic Valve: Normal, no AI.  Tricuspid Valve: Normal, mild TR.  Pulmonic Valve: Normal, no PR.  Left Atrium/ Left atrial appendage: Dilated LA, no thrombus in the LA or LAA. Normal emptying and filling velocities.  Atrial septum: Lipomatous hypertrophy of the interatrial septum.  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   PORTABLE CHEST - 1 VIEW COMPARISON: November 07, 2012. FINDINGS: The heart size and mediastinal contours are within normal limits. Both lungs are clear. No pneumothorax or pleural effusion is noted. Status post right shoulder arthroplasty. IMPRESSION: No acute cardiopulmonary abnormality seen.     Discharge Exam: Blood pressure 96/58, pulse 65, temperature 98.3 F (36.8 C), temperature source Oral, resp. rate 18, height 5\' 11"  (1.803 m), weight  203 lb 9.6 oz (92.352 kg), SpO2 100.00%.    Disposition: 01-Home or Self Care     Medication List    STOP taking these medications       aspirin 325 MG EC tablet     lisinopril 20 MG tablet  Commonly known as:  PRINIVIL,ZESTRIL      TAKE these medications       atorvastatin 10 MG tablet  Commonly known as:  LIPITOR  Take 10 mg by mouth daily with breakfast. Patient is presently holding this medication til the second week in January per doctor instructions.     azelastine 0.1 % nasal spray  Commonly known as:  ASTELIN  Place 1 spray into the nose 2 (two) times daily as needed. For congestion; Use in each nostril as directed     cholecalciferol 1000 UNITS tablet  Commonly known as:  VITAMIN D  Take 1,000 Units by mouth daily.     CoQ10 100 MG Caps  Take 100 mg by mouth daily.     cyanocobalamin 100 MCG tablet  Take 100 mcg by mouth daily.     EPINEPHrine 0.3 mg/0.3 mL Soaj injection  Commonly known as:  EPI-PEN  Inject 0.3 mg into the muscle once.     fexofenadine 180 MG tablet  Commonly known as:  ALLEGRA  Take 180 mg by mouth daily with breakfast. Pt takes allegra for 30 days then zyrtec for 30 days     Fish Oil 1200 MG Caps  Take 1,200 mg by mouth daily.     fluticasone 50 MCG/ACT nasal spray  Commonly known as:  FLONASE  Place 2 sprays into the nose daily as needed for allergies. Do not use while on "IL2"     magnesium gluconate 500 MG tablet  Commonly known as:  MAGONATE  Take 500 mg by mouth 2 (two) times daily.     metoprolol tartrate 25 MG tablet  Commonly known as:  LOPRESSOR  Take 1 tablet (25 mg total) by mouth 2 (two) times daily.     multivitamin with minerals Tabs tablet  Take 1 tablet by mouth daily.     nitroGLYCERIN 0.4 MG SL tablet  Commonly known as:  NITROSTAT  Place 0.4 mg under the tongue every 5 (five) minutes as needed for chest pain.     ranitidine 75 MG tablet  Commonly known as:  ZANTAC  Take 150 mg by mouth daily as needed  for heartburn.     rivaroxaban 20 MG Tabs tablet  Commonly known as:  XARELTO  Take 1 tablet (20 mg total) by mouth daily with supper.     SYSTANE 0.4-0.3 % Soln  Generic drug:  Polyethyl Glycol-Propyl Glycol  Place 1 drop into both eyes daily as needed (dry eyes).     vitamin C 500 MG tablet  Commonly known as:  ASCORBIC ACID  Take 500 mg by mouth daily.       Follow-up Information   Follow up with Sinclair Grooms, MD. (you will see Dr. Thompson Caul PA/NP in 2 weeks, the office will call you at home for this appt)    Specialty:  Cardiology   Contact information:   [address] [place] [address] [phone]       Follow up with Sinclair Grooms, MD On 12/26/2013. (at 10:15 am)    Specialty:  Cardiology   Contact information:   [address] [address] [phone]        Discharge Instructions: Heart Healthy diabetic diet  Call if you develop shortness of breath, chest pain.  Take xarelto with evening meal.  Call if you develop any bleeding.  We stopped your asprin and your lisinopril.   Signed: Cecilie Kicks Nurse Practitioner-Certified [facility]: [facility] 10/09/2013, 9:13 AM  Time spent on discharge :>35 minutes.

## 2013-10-09 NOTE — Progress Notes (Signed)
Subjective: No complaints, no SOB, feels much better  Objective: Vital signs in last 24 hours: Temp:  [97.3 F (36.3 C)-98.3 F (36.8 C)] 98.3 F (36.8 C) (06/09 0553) Pulse Rate:  [56-140] 65 (06/09 0553) Resp:  [12-21] 18 (06/09 0553) BP: (94-149)/(58-96) 96/58 mmHg (06/09 0553) SpO2:  [93 %-100 %] 100 % (06/09 0553) Weight:  [203 lb 9.6 oz (92.352 kg)] 203 lb 9.6 oz (92.352 kg) (06/09 0553) Weight change:  Last BM Date: 10/06/13 Intake/Output from previous day:   Intake/Output this shift:    PE: General:Pleasant affect, NAD Skin:Warm and dry, brisk capillary refill HEENT:normocephalic, sclera clear, mucus membranes moist Heart:S1S2 RRR without murmur, gallup, rub or click Lungs:clear without rales, rhonchi, or wheezes NIO:EVOJ, non tender, + BS, do not palpate liver spleen or masses Ext:no lower ext edema, 2+ pedal pulses, 2+ radial pulses Neuro:alert and oriented, MAE, follows commands, + facial symmetry   Lab Results:  Recent Labs  10/07/13 0403  WBC 6.6  HGB 13.8  HCT 41.4  PLT 173   BMET  Recent Labs  10/07/13 0830 10/08/13 0315  NA 140 142  K 4.7 5.0  CL 104 106  CO2 22 25  GLUCOSE 155* 99  BUN 27* 26*  CREATININE 1.30 1.36*  CALCIUM 9.0 9.1   No results found for this basename: TROPONINI, CK, MB,  in the last 72 hours  Lab Results  Component Value Date   CHOL 134 10/06/2013   HDL 43 10/06/2013   LDLCALC 76 10/06/2013   TRIG 73 10/06/2013   CHOLHDL 3.1 10/06/2013   Lab Results  Component Value Date   HGBA1C 6.4* 10/06/2013     Lab Results  Component Value Date   TSH 1.550 10/05/2013       Studies/Results: TEE: Left Ventrical: The cavity size was normal. Systolic function was normal. The estimated ejection fraction was in the range of 50% to 55%. Wall motion was normal; there were no regional wall motion abnormalities.  Mitral Valve: Moderate MR, multiple small jets.  Aortic Valve: Normal, no AI.  Tricuspid Valve: Normal,  mild TR.  Pulmonic Valve: Normal, no PR.  Left Atrium/ Left atrial appendage: Dilated LA, no thrombus in the LA or LAA. Normal emptying and filling velocities.  Atrial septum: Lipomatous hypertrophy of the interatrial septum.  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      Medications: I have reviewed the patient's current medications. Scheduled Meds: . aspirin  325 mg Oral Daily  . atorvastatin  10 mg Oral Q breakfast  . cholecalciferol  1,000 Units Oral Daily  . famotidine  10 mg Oral Daily  . loratadine  10 mg Oral Daily  . magnesium gluconate  500 mg Oral BID  . metoprolol tartrate  25 mg Oral BID  . multivitamin with minerals  1 tablet Oral Daily  . omega-3 acid ethyl esters  2 g Oral Daily  . rivaroxaban  20 mg Oral Q supper  . vitamin C  500 mg Oral Daily   Continuous Infusions:  PRN Meds:.fluticasone, nitroGLYCERIN  Assessment/Plan: Principal Problem:   PAF (paroxysmal atrial fibrillation)- s/p DCCV 10/08/13- maintaining SR Active Problems:   Coronary artery disease   NSTEMI (non-ST elevated myocardial infarction), type 2 due to demand ischemia   Atrial fibrillation with RVR   Anticoagulated- to go to Xarelto for a fib   Obesity   Diabetes-stable   Arthritis   Hyperlipidemia   Hypertension-- currently  with BB soft systolic BP, lopressor only given once yesterday   Renal cell carcinoma   Atrial fibrillation  Plan for discharge on  Lopressor and Xarelto, Lisinopril has been stopped due to hypotension.  Follow up with APP in 2 weeks. And ultimately Dr. Tamala Julian.  LOS: 4 days   Time spent with pt. :15 minutes. Cecilie Kicks  Nurse Practitioner Certified Pager [phone] or after 5pm and on weekends call [phone] 10/09/2013, 8:11 AM

## 2013-10-09 NOTE — Discharge Instructions (Signed)
Heart Healthy diabetic diet  Call if you develop shortness of breath, chest pain.  Take xarelto with evening meal.  Call if you develop any bleeding.  We stopped your Asprin and your lisinopril.

## 2013-10-23 ENCOUNTER — Encounter: Payer: Self-pay | Admitting: Nurse Practitioner

## 2013-10-23 ENCOUNTER — Ambulatory Visit (INDEPENDENT_AMBULATORY_CARE_PROVIDER_SITE_OTHER): Payer: Medicare PPO | Admitting: Nurse Practitioner

## 2013-10-23 ENCOUNTER — Encounter: Payer: Self-pay | Admitting: *Deleted

## 2013-10-23 VITALS — BP 120/80 | HR 61 | Ht 71.0 in | Wt 205.0 lb

## 2013-10-23 DIAGNOSIS — I480 Paroxysmal atrial fibrillation: Secondary | ICD-10-CM

## 2013-10-23 DIAGNOSIS — I4891 Unspecified atrial fibrillation: Secondary | ICD-10-CM

## 2013-10-23 LAB — CBC
HCT: 43.9 % (ref 39.0–52.0)
Hemoglobin: 14.4 g/dL (ref 13.0–17.0)
MCHC: 32.9 g/dL (ref 30.0–36.0)
MCV: 90.1 fl (ref 78.0–100.0)
Platelets: 201 10*3/uL (ref 150.0–400.0)
RBC: 4.87 Mil/uL (ref 4.22–5.81)
RDW: 14.9 % (ref 11.5–15.5)
WBC: 6.7 10*3/uL (ref 4.0–10.5)

## 2013-10-23 LAB — BASIC METABOLIC PANEL
BUN: 26 mg/dL — ABNORMAL HIGH (ref 6–23)
CO2: 27 mEq/L (ref 19–32)
Calcium: 9.3 mg/dL (ref 8.4–10.5)
Chloride: 105 mEq/L (ref 96–112)
Creatinine, Ser: 1.3 mg/dL (ref 0.4–1.5)
GFR: 60.16 mL/min (ref >60.00)
Glucose, Bld: 124 mg/dL — ABNORMAL HIGH (ref 70–99)
Potassium: 4.2 mEq/L (ref 3.5–5.1)
Sodium: 138 mEq/L (ref 135–145)

## 2013-10-23 MED ORDER — RIVAROXABAN 20 MG PO TABS: 20.0000 mg | ORAL_TABLET | Freq: Every day | ORAL | Status: DC

## 2013-10-23 NOTE — Progress Notes (Signed)
Donald Gray Date of Birth: 10-01-1940 Medical Record #[id number]  History of Present Illness: Donald Gray is seen back today for a post hospital visit. Seen for Dr. Tamala Julian. Has CAD with remote stent in 1995, obesity, diet controlled DM, HLD, HTN, renal carcinoma and PAF. CHADSVASC is 4.   He has a history of renal cell cancer with pulmonary mets for which he had a nephrectomy approximately 4 years ago and completed therapy with IL-2 in a chemotherapy trial. Prior to undergoing treatment for this [place] significant [place]. He states that he had a normal nuclear stress test last year. Patient had an episode of atrial fibrillation in 2006 after a vacation in which he ate lots of rich foods and drank many cups of coffee. This lasted only hours and he was placed on Coumadin anticoagulation for 6 months which was later discontinued as the dysrhythmia did not recur. He has had no problems since. He had a cardiopulmonary exercise test for evaluation of chronic dyspnea in 2013 which demonstrated mild functional limitation and some chronotropic incompetence thought to be related to the patient's beta-blockade. There was no clear cardiac/circulatory limitation. Obesity was felt to be playing a role in his dyspnea. The dyspnea resolved with termination of chemo and BB.  Most recently presented with PAF/NSTEMI type 2 due to demand ischemia. Had TEE and was cardioverted to NSR.   Comes back today. Here alone. Doing fine. Feels good. More energy. 3 brief spells of AF by his BP diary - very short lived. No chest pain. Tolerating his Xarelto. BP is good for the most part.   Current Outpatient Prescriptions  Medication Sig Dispense Refill  . atorvastatin (LIPITOR) 10 MG tablet Take 10 mg by mouth daily with breakfast. Patient is presently holding this medication til the second week in January per doctor instructions.      Marland Kitchen azelastine (ASTELIN) 137 MCG/SPRAY  nasal spray Place 1 spray into the nose 2 (two) times daily as needed. For congestion; Use in each nostril as directed      . cholecalciferol (VITAMIN D) 1000 UNITS tablet Take 1,000 Units by mouth daily.      . Coenzyme Q10 (COQ10) 100 MG CAPS Take 100 mg by mouth daily.      . cyanocobalamin 100 MCG tablet Take 100 mcg by mouth daily.      Marland Kitchen EPINEPHrine (EPI-PEN) 0.3 mg/0.3 mL SOAJ Inject 0.3 mg into the muscle once.      . fexofenadine (ALLEGRA) 180 MG tablet Take 180 mg by mouth daily with breakfast. Pt takes allegra for 30 days then zyrtec for 30 days      . fluticasone (FLONASE) 50 MCG/ACT nasal spray Place 2 sprays into the nose daily as needed for allergies. Do not use while on "IL2"      . Loratadine (CLARITIN) 10 MG CAPS Take 10 mg by mouth as needed.      . magnesium gluconate (MAGONATE) 500 MG tablet Take 500 mg by mouth 2 (two) times daily.       . metoprolol tartrate (LOPRESSOR) 25 MG tablet Take 1 tablet (25 mg total) by mouth 2 (two) times daily.  60 tablet  6  . Multiple Vitamin (MULTIVITAMIN WITH MINERALS) TABS Take 1 tablet by mouth daily.      . nitroGLYCERIN (NITROSTAT) 0.4 MG SL tablet Place 0.4 mg under the tongue every 5 (five) minutes as needed for chest pain.      Marland Kitchen  Omega-3 Fatty Acids (FISH OIL) 1200 MG CAPS Take 1,200 mg by mouth daily.      Vladimir Faster Glycol-Propyl Glycol (SYSTANE) 0.4-0.3 % SOLN Place 1 drop into both eyes daily as needed (dry eyes).      . ranitidine (ZANTAC) 75 MG tablet Take 150 mg by mouth daily as needed for heartburn.       . vitamin C (ASCORBIC ACID) 500 MG tablet Take 500 mg by mouth daily.       . rivaroxaban (XARELTO) 20 MG TABS tablet Take 1 tablet (20 mg total) by mouth daily with supper.  90 tablet  3   No current facility-administered medications for this visit.    Allergies  Allergen Reactions  . Penicillins Swelling  . Ambien [Zolpidem Tartrate] Other (See Comments)    Causes hallucinations.   . Codeine     hallucinations      Past Medical History  Diagnosis Date  . Hypertension   . Renal cell carcinoma     a. s/p nephrectomy  . Coronary artery disease     a. s/p MI with stent in 2000  . Hyperlipidemia   . Shortness of breath     a. related to chemo drugs  . Anemia   . GERD (gastroesophageal reflux disease)   . Diverticulosis   . PAF (paroxysmal atrial fibrillation)   . Myocardial infarction 1997  . Pneumonia ~ 2007  . Type II diabetes mellitus     "treating w/diet and weight loss"  . History of blood transfusion     "w/nephrectomy; w/shoulder replacement; w/knee replacement"  . Arthritis     "fingers, toes" (10/05/2013)  . Left knee DJD   . Anticoagulated- to go to Xarelto for a fib 10/07/2013  . NSTEMI (non-ST elevated myocardial infarction), type 2 due to demand ischemia 10/07/2013    Past Surgical History  Procedure Laterality Date  . Nephrectomy Right 08/14/2009  . Knee arthroscopy Right ~ 2011  . Pilonidal cyst drainage    . Total shoulder replacement Right 1997  . Joint replacement    . Inguinal hernia repair  09/22/2011    Procedure: HERNIA REPAIR INGUINAL ADULT BILATERAL;  Surgeon: Imogene Burn. Georgette Dover, MD;  Location: [facility];  Service: General;  Laterality: Bilateral;  . Orchiectomy  09/22/2011    Procedure: ORCHIECTOMY;  Surgeon: Franchot Gallo, MD;  Location: [facility];  Service: Urology;  Laterality: Left;  Left Testicular Exploration, Inguinal Approach,  Testicular   . Knee arthroscopy Left 11/13/2012    Procedure: ARTHROSCOPY KNEE;  Surgeon: Lorn Junes, MD;  Location: [place];  Service: Orthopedics;  Laterality: Left;  . Total knee arthroplasty Left 11/13/2012    Procedure: TOTAL KNEE ARTHROPLASTY;  Surgeon: Lorn Junes, MD;  Location: [place];  Service: Orthopedics;  Laterality: Left;  . Cheilectomy Right 04/13/2013    Procedure: Right Great Toe Cheilectomy, Debride Tendon and Abscess, Apply Stimulan Beads;  Surgeon: Newt Minion, MD;  Location: [facility];  Service: Orthopedics;   Laterality: Right;  Right Great Toe Cheilectomy, Debride Tendon and Abscess, Apply Stimulan Beads  . Inguinal hernia repair Bilateral 09/22/11  . Tonsillectomy and adenoidectomy  1948  . Cardiac catheterization  05/2009  . Coronary angioplasty with stent placement  1997    at [facility] R[facility] Fall to RCA   . Lung biopsy  2011  . Tee without cardioversion N/A 10/08/2013    Procedure: TRANSESOPHAGEAL ECHOCARDIOGRAM (TEE);  Surgeon: Dorothy Spark, MD;  Location: [place];  Service: Cardiovascular;  Laterality: N/A;  . Cardioversion N/A 10/08/2013    Procedure: CARDIOVERSION;  Surgeon: Dorothy Spark, MD;  Location[facility];  Service: Cardiovascular;  Laterality: N/A;    History  Smoking status  . Former Smoker -- 1.00 packs/day for 2 years  . Types: Cigarettes  . Quit date: 05/03/1961  Smokeless tobacco  . Never Used    History  Alcohol Use  . 0.6 oz/week  . 1 Glasses of wine per week    Family History  Problem Relation Age of Onset  . Heart disease Father     Died, 45  . Thyroid disease Mother     Living, 70  . Thyroid disease Brother   . Dementia Mother   . Hypertension Father   . Healthy Daughter   . Skin cancer Son     Review of Systems: The review of systems is per the HPI.  All other systems were reviewed and are negative.  Physical Exam: BP 120/80  Pulse 61  Ht 5\' 11"  (1.803 m)  Wt 205 lb (92.987 kg)  BMI 28.60 kg/m2 Patient is very pleasant and in no acute distress. He is very talkative. Skin is warm and dry. Color is normal.  HEENT is unremarkable. Normocephalic/atraumatic. PERRL. Sclera are nonicteric. Neck is supple. No masses. No JVD. Lungs are clear. Cardiac exam shows a regular rate and rhythm. Abdomen is soft. Extremities are without edema. Gait and ROM are intact. No gross neurologic deficits noted.  LABORATORY DATA:  EKG today shows sinus rhythm.   Lab Results  Component Value Date   WBC 6.6 10/07/2013   HGB 13.8 10/07/2013   HCT 41.4 10/07/2013   PLT 173  10/07/2013   GLUCOSE 99 10/08/2013   CHOL 134 10/06/2013   TRIG 73 10/06/2013   HDL 43 10/06/2013   LDLCALC 76 10/06/2013   ALT 29 04/12/2013   AST 23 04/12/2013   NA 142 10/08/2013   K 5.0 10/08/2013   CL 106 10/08/2013   CREATININE 1.36* 10/08/2013   BUN 26* 10/08/2013   CO2 25 10/08/2013   TSH 1.550 10/05/2013   INR 1.26 10/08/2013   HGBA1C 6.4* 10/06/2013     BNP (last 3 results)  Recent Labs  10/05/13 1717  PROBNP 2582.0*   Procedure: Transesophageal Echocardiogram  Indications: a-fib  Procedure Details  Consent: Obtained  Time Out: Verified patient identification, verified procedure, site/side was marked, verified correct patient position, special equipment/implants available, Radiology Safety Procedures followed, medications/allergies/relevent history reviewed, required imaging and test results available. Performed  Medications:  Fentanyl: 50 mcg  Versed: 4 mg  Left Ventrical: The cavity size was normal. Systolic function was normal. The estimated ejection fraction was in the range of 50% to 55%. Wall motion was normal; there were no regional wall motion abnormalities.  Mitral Valve: Moderate MR, multiple small jets.  Aortic Valve: Normal, no AI.  Tricuspid Valve: Normal, mild TR.  Pulmonic Valve: Normal, no PR.  Left Atrium/ Left atrial appendage: Dilated LA, no thrombus in the LA or LAA. Normal emptying and filling velocities.  Atrial septum: Lipomatous hypertrophy of the interatrial septum.  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  Complications: No apparent complications  Patient did tolerate procedure well.  Cardioversion Note  PRISCILLA FINKLEA  [id number]  21-Jan-1941  Procedure: DC Cardioversion  Indications: a-fib  Procedure Details  Consent: Obtained  Time Out: Verified patient identification, verified procedure, site/side was marked, verified correct patient position, special equipment/implants available, Radiology Safety Procedures  followed, medications/allergies/relevent history reviewed, required imaging and test results available. Performed  The patient has been on adequate anticoagulation. The patient received IV Propofol by anesthesia staff for sedation. Synchronous cardioversion was performed at 150 joules.  The cardioversion was successful.  Complications: No apparent complications  Patient did tolerate procedure well.  Dorothy Spark, MD, [facility]  10/08/2013, 12:25 PM   Assessment / Plan: 1. PAF - remains in sinus by EKG and exam today. I have left him on his current regimen. See him back in August as planned.   2. NSTEMI - type 2 - from demand ischemia -  No current symptoms.   3. CAD with remote stent  4. HLD   5. HTN -  BP is ok - his BP diary is satisfactory.   6. History of renal cell carcinoma with pulmonary mets - s/p chemo. Followed in [place].   Patient is agreeable to this plan and will call if any problems develop in the interim.   Burtis Junes, RN, [facility] [address] [place], [place]  [phone]

## 2013-10-23 NOTE — Patient Instructions (Addendum)
Stay on your current medicines  See Dr. Tamala Julian as planned in August  We will check labs today  Call the [place] office at [phone] if you have any questions, problems or concerns.

## 2013-12-07 ENCOUNTER — Other Ambulatory Visit: Payer: Self-pay | Admitting: Internal Medicine

## 2013-12-07 ENCOUNTER — Ambulatory Visit
Admission: RE | Admit: 2013-12-07 | Discharge: 2013-12-07 | Disposition: A | Discharge status: Home / Self Care | Payer: Medicare PPO | Source: Ambulatory Visit | Attending: Internal Medicine | Admitting: Internal Medicine

## 2013-12-07 DIAGNOSIS — R509 Fever, unspecified: Secondary | ICD-10-CM

## 2013-12-26 ENCOUNTER — Encounter: Payer: Self-pay | Admitting: Interventional Cardiology

## 2013-12-26 ENCOUNTER — Ambulatory Visit (INDEPENDENT_AMBULATORY_CARE_PROVIDER_SITE_OTHER): Payer: Medicare PPO | Admitting: Interventional Cardiology

## 2013-12-26 VITALS — BP 120/64 | HR 82 | Ht 71.0 in | Wt 203.4 lb

## 2013-12-26 DIAGNOSIS — I10 Essential (primary) hypertension: Secondary | ICD-10-CM

## 2013-12-26 DIAGNOSIS — I5033 Acute on chronic diastolic (congestive) heart failure: Secondary | ICD-10-CM

## 2013-12-26 DIAGNOSIS — I480 Paroxysmal atrial fibrillation: Secondary | ICD-10-CM

## 2013-12-26 DIAGNOSIS — I4891 Unspecified atrial fibrillation: Secondary | ICD-10-CM

## 2013-12-26 DIAGNOSIS — I5032 Chronic diastolic (congestive) heart failure: Secondary | ICD-10-CM | POA: Insufficient documentation

## 2013-12-26 DIAGNOSIS — I2510 Atherosclerotic heart disease of native coronary artery without angina pectoris: Secondary | ICD-10-CM

## 2013-12-26 NOTE — Progress Notes (Signed)
Patient ID: Donald Gray, male   DOB: 06-02-1940, 73 y.o.   MRN: [id number]    [address], [address], [place]  [address] Phone: [phone] Fax:  [phone]  Date:  12/26/2013   ID:  Donald Gray, DOB 04-15-1941, MRN [id number]  PCP:  Irven Shelling, MD   ASSESSMENT:  1. Paroxysmal atrial fibrillation, now status post cardioversion in stable 2. Acute diastolic heart failure, resolved 3. Coronary artery disease stable without angina  PLAN:  1. Six-month clinical followup 2. No change in current medical regimen   SUBJECTIVE: Donald Gray is a 73 y.o. male who is doing well. He feels that he is having insomnia related to Xarelto. He's had no recurrence of atrial fibrillation. He denies neurological symptoms. He denies angina.   Wt Readings from Last 3 Encounters:  12/26/13 203 lb 6.4 oz (92.262 kg)  10/23/13 205 lb (92.987 kg)  10/09/13 203 lb 9.6 oz (92.352 kg)     Past Medical History  Diagnosis Date  . Hypertension   . Renal cell carcinoma     a. s/p nephrectomy  . Coronary artery disease     a. s/p MI with stent in 2000  . Hyperlipidemia   . Shortness of breath     a. related to chemo drugs  . Anemia   . GERD (gastroesophageal reflux disease)   . Diverticulosis   . PAF (paroxysmal atrial fibrillation)   . Myocardial infarction 1997  . Pneumonia ~ 2007  . Type II diabetes mellitus     "treating w/diet and weight loss"  . History of blood transfusion     "w/nephrectomy; w/shoulder replacement; w/knee replacement"  . Arthritis     "fingers, toes" (10/05/2013)  . Left knee DJD   . Anticoagulated- to go to Xarelto for a fib 10/07/2013  . NSTEMI (non-ST elevated myocardial infarction), type 2 due to demand ischemia 10/07/2013    Current Outpatient Prescriptions  Medication Sig Dispense Refill  . atorvastatin (LIPITOR) 10 MG tablet Take 10 mg by mouth daily with breakfast. Patient is presently holding this medication til the  second week in January per doctor instructions.      Marland Kitchen azelastine (ASTELIN) 137 MCG/SPRAY nasal spray Place 1 spray into the nose 2 (two) times daily as needed. For congestion; Use in each nostril as directed      . cholecalciferol (VITAMIN D) 1000 UNITS tablet Take 1,000 Units by mouth daily.      . Coenzyme Q10 (COQ10) 100 MG CAPS Take 100 mg by mouth daily.      . cyanocobalamin 100 MCG tablet Take 100 mcg by mouth daily.      Marland Kitchen EPINEPHrine (EPI-PEN) 0.3 mg/0.3 mL SOAJ Inject 0.3 mg into the muscle once.      . fexofenadine (ALLEGRA) 180 MG tablet Take 180 mg by mouth daily with breakfast. Pt takes allegra for 30 days then zyrtec for 30 days      . fluticasone (FLONASE) 50 MCG/ACT nasal spray Place 2 sprays into the nose daily as needed for allergies. Do not use while on "IL2"      . Loratadine (CLARITIN) 10 MG CAPS Take 10 mg by mouth as needed.      . magnesium gluconate (MAGONATE) 500 MG tablet Take 500 mg by mouth 2 (two) times daily.       . metoprolol tartrate (LOPRESSOR) 25 MG tablet Take 1 tablet (25 mg total) by mouth 2 (two) times daily.  [place]  tablet  6  . Multiple Vitamin (MULTIVITAMIN WITH MINERALS) TABS Take 1 tablet by mouth daily.      . nitroGLYCERIN (NITROSTAT) 0.4 MG SL tablet Place 0.4 mg under the tongue every 5 (five) minutes as needed for chest pain.      . Omega-3 Fatty Acids (FISH OIL) 1200 MG CAPS Take 1,200 mg by mouth daily.      Vladimir Faster Glycol-Propyl Glycol (SYSTANE) 0.4-0.3 % SOLN Place 1 drop into both eyes daily as needed (dry eyes).      . ranitidine (ZANTAC) 75 MG tablet Take 150 mg by mouth daily as needed for heartburn.       . rivaroxaban (XARELTO) 20 MG TABS tablet Take 1 tablet (20 mg total) by mouth daily with supper.  90 tablet  3  . vitamin C (ASCORBIC ACID) 500 MG tablet Take 500 mg by mouth daily.        No current facility-administered medications for this visit.    Allergies:    Allergies  Allergen Reactions  . Penicillins Swelling  . Ambien  [Zolpidem Tartrate] Other (See Comments)    Causes hallucinations.   . Codeine     hallucinations     Social History:  The patient  reports that he quit smoking about 52 years ago. His smoking use included Cigarettes. He has a 2 pack-year smoking history. He has never used smokeless tobacco. He reports that he drinks about .6 ounces of alcohol per week. He reports that he does not use illicit drugs.   ROS:  Please see the history of present illness.   Appetite is been stable.   All other systems reviewed and negative.   OBJECTIVE: VS:  BP 120/64  Pulse 82  Ht 5\' 11"  (1.803 m)  Wt 203 lb 6.4 oz (92.262 kg)  BMI 28.38 kg/m2 Well nourished, well developed, in no acute distress, somewhat pale-appearing HEENT: normal Neck: JVD flat. Carotid bruit absent  Cardiac:  normal S1, S2; RRR; no murmur Lungs:  clear to auscultation bilaterally, no wheezing, rhonchi or rales Abd: soft, nontender, no hepatomegaly Ext: Edema absent. Pulses 2+ Skin: warm and dry Neuro:  CNs 2-12 intact, no focal abnormalities noted  EKG:  Not performed       Signed, Illene Labrador III, MD 12/26/2013 10:19 AM

## 2013-12-26 NOTE — Patient Instructions (Signed)
Your physician recommends that you continue on your current medications as directed. Please refer to the Current Medication list given to you today.  Your physician wants you to follow-up in: 6 months. You will receive a reminder letter in the mail two months in advance. If you don't receive a letter, please call our office to schedule the follow-up appointment.  

## 2013-12-28 ENCOUNTER — Other Ambulatory Visit: Payer: Self-pay | Admitting: Internal Medicine

## 2013-12-28 ENCOUNTER — Ambulatory Visit
Admission: RE | Admit: 2013-12-28 | Discharge: 2013-12-28 | Disposition: A | Discharge status: Home / Self Care | Payer: Medicare PPO | Source: Ambulatory Visit | Attending: Internal Medicine | Admitting: Internal Medicine

## 2013-12-28 DIAGNOSIS — J189 Pneumonia, unspecified organism: Secondary | ICD-10-CM

## 2014-03-21 ENCOUNTER — Other Ambulatory Visit: Payer: Self-pay | Admitting: *Deleted

## 2014-03-21 MED ORDER — RIVAROXABAN 20 MG PO TABS: 20.0000 mg | ORAL_TABLET | Freq: Every day | ORAL | Status: DC

## 2014-03-29 ENCOUNTER — Telehealth: Payer: Self-pay | Admitting: Interventional Cardiology

## 2014-03-29 NOTE — Telephone Encounter (Signed)
Received request from Nurse fax box, documents faxed for surgical clearance. To: [employer] Fax number: [phone] Attention: 11.27.15/km

## 2014-04-01 NOTE — Progress Notes (Signed)
[name], [place] - Please enter preop orders in Epic for Donald Gray - he is coming to [facility] on 12/4 for his preop appt / labs.  Thanks

## 2014-04-02 ENCOUNTER — Other Ambulatory Visit: Payer: Self-pay | Admitting: Surgical

## 2014-04-03 NOTE — H&P (Signed)
Donald Gray is an 73 y.o. male.   Chief Complaint: right foot pain HPI: The patient is a 73 year old male who presents with the chief complaint of right foot pain. He has an associated mass over the dorsal aspect of his right foot. He reports that he has had this mass for over a year. The cyst developed last year and got infected. He had an I&D and it seemed to get better. About 6 months ago the cyst redeveloped. He has tried using corn pads which do help some but the adhesive irritates his skin.  Past Medical History  Diagnosis Date  . Hypertension   . Renal cell carcinoma     a. s/p nephrectomy  . Coronary artery disease     a. s/p MI with stent in 2000  . Hyperlipidemia   . Shortness of breath     a. related to chemo drugs  . Anemia   . GERD (gastroesophageal reflux disease)   . Diverticulosis   . PAF (paroxysmal atrial fibrillation)   . Myocardial infarction 1997  . Pneumonia ~ 2007  . Type II diabetes mellitus     "treating w/diet and weight loss"  . History of blood transfusion     "w/nephrectomy; w/shoulder replacement; w/knee replacement"  . Arthritis     "fingers, toes" (10/05/2013)  . Left knee DJD   . Anticoagulated- to go to Xarelto for a fib 10/07/2013  . NSTEMI (non-ST elevated myocardial infarction), type 2 due to demand ischemia 10/07/2013    Past Surgical History  Procedure Laterality Date  . Nephrectomy Right 08/14/2009  . Knee arthroscopy Right ~ 2011  . Pilonidal cyst drainage    . Total shoulder replacement Right 1997  . Joint replacement    . Inguinal hernia repair  09/22/2011    Procedure: HERNIA REPAIR INGUINAL ADULT BILATERAL;  Surgeon: Imogene Burn. Georgette Dover, MD;  Location: [facility];  Service: General;  Laterality: Bilateral;  . Orchiectomy  09/22/2011    Procedure: ORCHIECTOMY;  Surgeon: Franchot Gallo, MD;  Location: [facility];  Service: Urology;  Laterality: Left;  Left Testicular Exploration, Inguinal Approach,  Testicular   . Knee arthroscopy Left  11/13/2012    Procedure: ARTHROSCOPY KNEE;  Surgeon: Lorn Junes, MD;  Location: [place];  Service: Orthopedics;  Laterality: Left;  . Total knee arthroplasty Left 11/13/2012    Procedure: TOTAL KNEE ARTHROPLASTY;  Surgeon: Lorn Junes, MD;  Location: [place];  Service: Orthopedics;  Laterality: Left;  . Cheilectomy Right 04/13/2013    Procedure: Right Great Toe Cheilectomy, Debride Tendon and Abscess, Apply Stimulan Beads;  Surgeon: Newt Minion, MD;  Location: [place];  Service: Orthopedics;  Laterality: Right;  Right Great Toe Cheilectomy, Debride Tendon and Abscess, Apply Stimulan Beads  . Inguinal hernia repair Bilateral 09/22/11  . Tonsillectomy and adenoidectomy  1948  . Cardiac catheterization  05/2009  . Coronary angioplasty with stent placement  1997    at [facility] to RCA   . Lung biopsy  2011  . Tee without cardioversion N/A 10/08/2013    Procedure: TRANSESOPHAGEAL ECHOCARDIOGRAM (TEE);  Surgeon: Dorothy Spark, MD;  Location: [place];  Service: Cardiovascular;  Laterality: N/A;  . Cardioversion N/A 10/08/2013    Procedure: CARDIOVERSION;  Surgeon: Dorothy Spark, MD;  Location[facility];  Service: Cardiovascular;  Laterality: N/A;    Family History  Problem Relation Age of Onset  . Heart disease Father     Died, 27  . Thyroid disease  Mother     Living, 95  . Thyroid disease Brother   . Dementia Mother   . Hypertension Father   . Healthy Daughter   . Skin cancer Son    Social History:  reports that he quit smoking about 52 years ago. His smoking use included Cigarettes. He has a 2 pack-year smoking history. He has never used smokeless tobacco. He reports that he drinks about 0.6 oz of alcohol per week. He reports that he does not use illicit drugs.  Allergies:  Allergies  Allergen Reactions  . Penicillins Swelling  . Ambien [Zolpidem Tartrate] Other (See Comments)    Causes hallucinations.   . Codeine     hallucinations     Current outpatient prescriptions:   atorvastatin (LIPITOR) 10 MG tablet, Take 10 mg by mouth daily with breakfast. , Disp: , Rfl: ;   azelastine (ASTELIN) 137 MCG/SPRAY nasal spray, Place 1 spray into the nose every morning. For congestion; Use in each nostril as directed, Disp: , Rfl: ;   cholecalciferol (VITAMIN D) 1000 UNITS tablet, Take 1,000 Units by mouth daily., Disp: , Rfl:  Coenzyme Q10 (COQ10) 100 MG CAPS, Take 100 mg by mouth daily., Disp: , Rfl: ;   cyanocobalamin 100 MCG tablet, Take 100 mcg by mouth daily., Disp: , Rfl: ;   fexofenadine (ALLEGRA) 180 MG tablet, Take 180 mg by mouth daily with breakfast. , Disp: , Rfl: ;   fluticasone (FLONASE) 50 MCG/ACT nasal spray, Place 2 sprays into the nose every evening. Do not use while on "IL2", Disp: , Rfl:  magnesium gluconate (MAGONATE) 500 MG tablet, Take 500 mg by mouth 2 (two) times daily. , Disp: , Rfl: ;   metoprolol tartrate (LOPRESSOR) 25 MG tablet, Take 1 tablet (25 mg total) by mouth 2 (two) times daily., Disp: 60 tablet, Rfl: 6;   Multiple Vitamin (MULTIVITAMIN WITH MINERALS) TABS, Take 1 tablet by mouth daily., Disp: , Rfl: ;  Omega-3 Fatty Acids  (FISH OIL) 1200 MG CAPS, Take 1,200 mg by mouth daily., Disp: , Rfl:  ranitidine (ZANTAC) 75 MG tablet, Take 150 mg by mouth daily as needed for heartburn. , Disp: , Rfl: ;   rivaroxaban (XARELTO) 20 MG TABS tablet, Take 1 tablet (20 mg total) by mouth daily with supper., Disp: 30 tablet, Rfl: 0;   vitamin C (ASCORBIC ACID) 500 MG tablet, Take 500 mg by mouth daily. , Disp: , Rfl: ;   EPINEPHrine (EPI-PEN) 0.3 mg/0.3 mL SOAJ, Inject 0.3 mg into the muscle once., Disp: , Rfl:  Loratadine (CLARITIN) 10 MG CAPS, Take 10 mg by mouth as needed., Disp: , Rfl: ;   nitroGLYCERIN (NITROSTAT) 0.4 MG SL tablet, Place 0.4 mg under the tongue every 5 (five) minutes as needed for chest pain., Disp: , Rfl: ;   Polyethyl Glycol-Propyl Glycol (SYSTANE) 0.4-0.3 % SOLN, Place 1 drop into both eyes daily as needed (dry eyes)., Disp: , Rfl:    Review of Systems  Constitutional: Negative.   HENT: Negative.   Eyes: Negative.   Respiratory: Negative.   Cardiovascular: Negative.   Gastrointestinal: Negative.   Genitourinary: Negative.   Musculoskeletal: Positive for joint pain. Negative for myalgias, back pain, falls and neck pain.  Skin: Negative.   Neurological: Positive for tingling and sensory change. Negative for dizziness, tremors, speech change, focal weakness, seizures and loss of consciousness.  Endo/Heme/Allergies: Negative.   Psychiatric/Behavioral: Negative.     Physical Exam  Constitutional: He is oriented to person, place, and time.  He appears well-developed and well-nourished. No distress.  HENT:  Head: Normocephalic and atraumatic.  Right Ear: External ear normal.  Left Ear: External ear normal.  Nose: Nose normal.  Mouth/Throat: Oropharynx is clear and moist.  Eyes: Conjunctivae and EOM are normal.  Neck: Normal range of motion. Neck supple.  Cardiovascular: Normal rate, normal heart sounds and intact distal pulses.  An irregularly irregular rhythm present.  No murmur heard. Respiratory: Effort normal and breath sounds normal. No respiratory distress. He has no wheezes.  GI: Soft. Bowel sounds are normal. He exhibits no distension. There is no tenderness.  Neurological: He is alert and oriented to person, place, and time. He has normal strength and normal reflexes. A sensory deficit is present.  Skin: No rash noted. He is not diaphoretic. No erythema.  Psychiatric: He has a normal mood and affect. His behavior is normal.     Assessment/Plan Mass over dorsum of right foot He has a mass over the dorsal aspect of his right foot. There is no functional disability caused by the mass but it is very painful. We will move forward with an excision of the mass from the dorsum of the right foot. Risks and benefits of the procedure were discussed with the patient by Dr. Gladstone Lighter.  H&P performed by Dr. Latanya Maudlin Documented by [name], PA-C  [place], Caylyn Tedeschi Ander Purpura 04/03/2014, 8:56 AM

## 2014-04-04 ENCOUNTER — Other Ambulatory Visit (HOSPITAL_COMMUNITY): Payer: Self-pay | Admitting: *Deleted

## 2014-04-04 NOTE — Patient Instructions (Addendum)
Donald Gray  04/04/2014   Your procedure is scheduled on: Friday December 11th, 2015  Report to [place]  Entrance and follow signs to               [place] at  100 pm  Call this number if you have problems the morning of surgery [phone]   Remember:  Do not eat food:After Midnight Thursday night, clear liquids until 900 am day of surgery, no clear liquids after 900 am day of surgery.     Take these medicines the morning of surgery with A SIP OF WATER: Metolprolol                               You may not have any metal on your body including hair pins and              piercings  Do not wear jewelry,lotions, powders or colognes.                           Men may shave face and neck.   Do not bring valuables to the hospital. [place].  Contacts, dentures or bridgework may not be worn into surgery.      Patients discharged the day of surgery will not be allowed to drive home.  Name and phone number of your driver:Donald Gray (wife)  _____________________________________________________________________             [facility] - Preparing for Surgery Before surgery, you can play an important role.  Because skin is not sterile, your skin needs to be as free of germs as possible.  You can reduce the number of germs on your skin by washing with CHG (chlorahexidine gluconate) soap before surgery.  CHG is an antiseptic cleaner which kills germs and bonds with the skin to continue killing germs even after washing. Please DO NOT use if you have an allergy to CHG or antibacterial soaps.  If your skin becomes reddened/irritated stop using the CHG and inform your nurse when you arrive at Short Stay. Do not shave (including legs and underarms) for at least 48 hours prior to the first CHG shower.  You may shave your face/neck. Please follow these instructions carefully:  1.   Shower with CHG Soap the night before surgery and the  morning of Surgery.  2.  If you choose to wash your hair, wash your hair first as usual with your  normal  shampoo.  3.  After you shampoo, rinse your hair and body thoroughly to remove the  shampoo.                           4.  Use CHG as you would any other liquid soap.  You can apply chg directly  to the skin and wash                       Gently with a scrungie or clean washcloth.  5.  Apply the CHG Soap to your body ONLY FROM THE NECK DOWN.   Do not use on face/ open  Wound or open sores. Avoid contact with eyes, ears mouth and genitals (private parts).                       Wash face,  Genitals (private parts) with your normal soap.             6.  Wash thoroughly, paying special attention to the area where your surgery  will be performed.  7.  Thoroughly rinse your body with warm water from the neck down.  8.  DO NOT shower/wash with your normal soap after using and rinsing off  the CHG Soap.                9.  Pat yourself dry with a clean towel.            10.  Wear clean pajamas.            11.  Place clean sheets on your bed the night of your first shower and do not  sleep with pets. Day of Surgery : Do not apply any lotions/deodorants the morning of surgery.  Please wear clean clothes to the hospital/surgery center.  FAILURE TO FOLLOW THESE INSTRUCTIONS MAY RESULT IN THE CANCELLATION OF YOUR SURGERY PATIENT SIGNATURE_________________________________  NURSE SIGNATURE__________________________________  ________________________________________________________________________    CLEAR LIQUID DIET   Foods Allowed                                                                     Foods Excluded  Coffee and tea, regular and decaf                             liquids that you cannot  Plain Jell-O in any flavor                                             see through such as: Fruit ices (not with fruit  pulp)                                     milk, soups, orange juice  Iced Popsicles                                    All solid food Carbonated beverages, regular and diet                                    Cranberry, grape and apple juices Sports drinks like Gatorade Lightly seasoned clear broth or consume(fat free) Sugar, honey syrup  Sample Menu Breakfast                                Lunch  Supper Cranberry juice                    Beef broth                            Chicken broth Jell-O                                     Grape juice                           Apple juice Coffee or tea                        Jell-O                                      Popsicle                                                Coffee or tea                        Coffee or tea  _____________________________________________________________________

## 2014-04-05 ENCOUNTER — Encounter (HOSPITAL_COMMUNITY)
Admission: RE | Admit: 2014-04-05 | Discharge: 2014-04-05 | Disposition: A | Discharge status: Home / Self Care | Payer: Medicare PPO | Source: Ambulatory Visit | Attending: Orthopedic Surgery | Admitting: Orthopedic Surgery

## 2014-04-05 ENCOUNTER — Encounter (HOSPITAL_COMMUNITY): Payer: Self-pay

## 2014-04-05 DIAGNOSIS — Z01812 Encounter for preprocedural laboratory examination: Secondary | ICD-10-CM | POA: Insufficient documentation

## 2014-04-05 HISTORY — DX: Polyneuropathy, unspecified: G62.9

## 2014-04-05 HISTORY — DX: Enthesopathy, unspecified: M77.9

## 2014-04-05 LAB — CBC
HEMATOCRIT: 42.9 % (ref 39.0–52.0)
Hemoglobin: 14.6 g/dL (ref 13.0–17.0)
MCH: 30.5 pg (ref 26.0–34.0)
MCHC: 34 g/dL (ref 30.0–36.0)
MCV: 89.7 fL (ref 78.0–100.0)
Platelets: 189 10*3/uL (ref 150–400)
RBC: 4.78 MIL/uL (ref 4.22–5.81)
RDW: 13.9 % (ref 11.5–15.5)
WBC: 8.2 10*3/uL (ref 4.0–10.5)

## 2014-04-05 LAB — BASIC METABOLIC PANEL
Anion gap: 16 — ABNORMAL HIGH (ref 5–15)
BUN: 32 mg/dL — AB (ref 6–23)
CO2: 20 mEq/L (ref 19–32)
CREATININE: 1.4 mg/dL — AB (ref 0.50–1.35)
Calcium: 9.6 mg/dL (ref 8.4–10.5)
Chloride: 101 mEq/L (ref 96–112)
GFR, EST AFRICAN AMERICAN: 56 mL/min — AB (ref >90)
GFR, EST NON AFRICAN AMERICAN: 48 mL/min — AB (ref >90)
Glucose, Bld: 165 mg/dL — ABNORMAL HIGH (ref 70–99)
Potassium: 4.4 mEq/L (ref 3.7–5.3)
Sodium: 137 mEq/L (ref 137–147)

## 2014-04-05 NOTE — Progress Notes (Signed)
Your patient has screened at an elevated risk for Obstructive Sleep Apnea using the Stop-Bang Tool during a pre-surgical vist. A score of 4 or greater is an elevated risk.Score is 4.

## 2014-04-05 NOTE — Progress Notes (Addendum)
CXR per epic 12/28/2013 Dr Mallie Mussel Smith's clearance note per chart 03/19/2014 ECHO per epic 10/08/2013 EKG per epic 10/23/2013

## 2014-04-08 NOTE — Progress Notes (Signed)
BMP results per PAT visit on 04/05/2014 sent to Dr Gladstone Lighter

## 2014-04-11 NOTE — Progress Notes (Signed)
Called patient and informed him of time change for surgery for Friday 04/12/2014 and to report to Short Stay at [facility] at 1130am and may have clear liquids from midnight up until 0730 am then nothing until after surgery.

## 2014-04-12 ENCOUNTER — Ambulatory Visit (HOSPITAL_COMMUNITY)
Admission: RE | Admit: 2014-04-12 | Discharge: 2014-04-12 | Disposition: A | Discharge status: Home / Self Care | Payer: Medicare PPO | Source: Ambulatory Visit | Attending: Orthopedic Surgery | Admitting: Orthopedic Surgery

## 2014-04-12 ENCOUNTER — Encounter (HOSPITAL_COMMUNITY): Payer: Self-pay | Admitting: *Deleted

## 2014-04-12 ENCOUNTER — Ambulatory Visit (HOSPITAL_COMMUNITY): Payer: Medicare PPO | Admitting: *Deleted

## 2014-04-12 ENCOUNTER — Encounter (HOSPITAL_COMMUNITY)
Admission: RE | Disposition: A | Discharge status: Home / Self Care | Payer: Self-pay | Source: Ambulatory Visit | Attending: Orthopedic Surgery

## 2014-04-12 DIAGNOSIS — Z888 Allergy status to other drugs, medicaments and biological substances status: Secondary | ICD-10-CM | POA: Insufficient documentation

## 2014-04-12 DIAGNOSIS — D649 Anemia, unspecified: Secondary | ICD-10-CM | POA: Insufficient documentation

## 2014-04-12 DIAGNOSIS — Z885 Allergy status to narcotic agent status: Secondary | ICD-10-CM | POA: Insufficient documentation

## 2014-04-12 DIAGNOSIS — C649 Malignant neoplasm of unspecified kidney, except renal pelvis: Secondary | ICD-10-CM | POA: Diagnosis not present

## 2014-04-12 DIAGNOSIS — I10 Essential (primary) hypertension: Secondary | ICD-10-CM | POA: Diagnosis not present

## 2014-04-12 DIAGNOSIS — Z96611 Presence of right artificial shoulder joint: Secondary | ICD-10-CM | POA: Insufficient documentation

## 2014-04-12 DIAGNOSIS — I2510 Atherosclerotic heart disease of native coronary artery without angina pectoris: Secondary | ICD-10-CM | POA: Diagnosis not present

## 2014-04-12 DIAGNOSIS — E785 Hyperlipidemia, unspecified: Secondary | ICD-10-CM | POA: Insufficient documentation

## 2014-04-12 DIAGNOSIS — M67471 Ganglion, right ankle and foot: Secondary | ICD-10-CM | POA: Insufficient documentation

## 2014-04-12 DIAGNOSIS — Z880 Allergy status to penicillin: Secondary | ICD-10-CM | POA: Diagnosis not present

## 2014-04-12 DIAGNOSIS — I480 Paroxysmal atrial fibrillation: Secondary | ICD-10-CM | POA: Insufficient documentation

## 2014-04-12 DIAGNOSIS — K219 Gastro-esophageal reflux disease without esophagitis: Secondary | ICD-10-CM | POA: Insufficient documentation

## 2014-04-12 DIAGNOSIS — Z905 Acquired absence of kidney: Secondary | ICD-10-CM | POA: Insufficient documentation

## 2014-04-12 DIAGNOSIS — Z955 Presence of coronary angioplasty implant and graft: Secondary | ICD-10-CM | POA: Diagnosis not present

## 2014-04-12 DIAGNOSIS — M179 Osteoarthritis of knee, unspecified: Secondary | ICD-10-CM | POA: Insufficient documentation

## 2014-04-12 DIAGNOSIS — R229 Localized swelling, mass and lump, unspecified: Secondary | ICD-10-CM | POA: Insufficient documentation

## 2014-04-12 DIAGNOSIS — Z87891 Personal history of nicotine dependence: Secondary | ICD-10-CM | POA: Insufficient documentation

## 2014-04-12 DIAGNOSIS — Z85528 Personal history of other malignant neoplasm of kidney: Secondary | ICD-10-CM | POA: Diagnosis not present

## 2014-04-12 DIAGNOSIS — Z808 Family history of malignant neoplasm of other organs or systems: Secondary | ICD-10-CM | POA: Insufficient documentation

## 2014-04-12 DIAGNOSIS — Z6830 Body mass index (BMI) 30.0-30.9, adult: Secondary | ICD-10-CM | POA: Diagnosis not present

## 2014-04-12 DIAGNOSIS — Z8249 Family history of ischemic heart disease and other diseases of the circulatory system: Secondary | ICD-10-CM | POA: Insufficient documentation

## 2014-04-12 DIAGNOSIS — E669 Obesity, unspecified: Secondary | ICD-10-CM | POA: Diagnosis not present

## 2014-04-12 DIAGNOSIS — N289 Disorder of kidney and ureter, unspecified: Secondary | ICD-10-CM | POA: Insufficient documentation

## 2014-04-12 DIAGNOSIS — E119 Type 2 diabetes mellitus without complications: Secondary | ICD-10-CM | POA: Diagnosis not present

## 2014-04-12 DIAGNOSIS — Z818 Family history of other mental and behavioral disorders: Secondary | ICD-10-CM | POA: Diagnosis not present

## 2014-04-12 DIAGNOSIS — K5790 Diverticulosis of intestine, part unspecified, without perforation or abscess without bleeding: Secondary | ICD-10-CM | POA: Diagnosis not present

## 2014-04-12 DIAGNOSIS — I252 Old myocardial infarction: Secondary | ICD-10-CM | POA: Insufficient documentation

## 2014-04-12 HISTORY — PX: MASS EXCISION: SHX2000

## 2014-04-12 LAB — GLUCOSE, CAPILLARY
GLUCOSE-CAPILLARY: 86 mg/dL (ref 70–99)
Glucose-Capillary: 92 mg/dL (ref 70–99)

## 2014-04-12 SURGERY — EXCISION MASS
Anesthesia: Monitor Anesthesia Care | Site: Foot | Laterality: Right

## 2014-04-12 MED ORDER — PROPOFOL 10 MG/ML IV BOLUS
INTRAVENOUS | Status: AC
  Filled 2014-04-12: qty 20

## 2014-04-12 MED ORDER — FENTANYL CITRATE 0.05 MG/ML IJ SOLN
INTRAMUSCULAR | Status: AC
  Filled 2014-04-12: qty 2

## 2014-04-12 MED ORDER — MIDAZOLAM HCL 2 MG/2ML IJ SOLN
INTRAMUSCULAR | Status: AC
  Filled 2014-04-12: qty 2

## 2014-04-12 MED ORDER — BACITRACIN-NEOMYCIN-POLYMYXIN 400-5-5000 EX OINT
TOPICAL_OINTMENT | CUTANEOUS | Status: AC
  Filled 2014-04-12: qty 1

## 2014-04-12 MED ORDER — PROPOFOL INFUSION 10 MG/ML OPTIME
INTRAVENOUS | Status: DC | PRN
  Administered 2014-04-12: 50 ug/kg/min via INTRAVENOUS

## 2014-04-12 MED ORDER — PROPOFOL 10 MG/ML IV BOLUS
INTRAVENOUS | Status: DC | PRN
  Administered 2014-04-12: 20 mg via INTRAVENOUS
  Administered 2014-04-12: 50 mg via INTRAVENOUS

## 2014-04-12 MED ORDER — CLINDAMYCIN PHOSPHATE 900 MG/50ML IV SOLN
INTRAVENOUS | Status: AC
  Filled 2014-04-12: qty 50

## 2014-04-12 MED ORDER — FENTANYL CITRATE 0.05 MG/ML IJ SOLN
INTRAMUSCULAR | Status: DC | PRN
  Administered 2014-04-12 (×2): 50 ug via INTRAVENOUS

## 2014-04-12 MED ORDER — ONDANSETRON HCL 4 MG/2ML IJ SOLN
INTRAMUSCULAR | Status: DC | PRN
  Administered 2014-04-12: 4 mg via INTRAVENOUS

## 2014-04-12 MED ORDER — PROMETHAZINE HCL 25 MG/ML IJ SOLN: 6.2500 mg | INTRAMUSCULAR | Status: DC | PRN

## 2014-04-12 MED ORDER — BUPIVACAINE HCL (PF) 0.25 % IJ SOLN
INTRAMUSCULAR | Status: AC
  Filled 2014-04-12: qty 30

## 2014-04-12 MED ORDER — HYDROMORPHONE HCL 2 MG PO TABS: 2.0000 mg | ORAL_TABLET | ORAL | Status: DC | PRN

## 2014-04-12 MED ORDER — MIDAZOLAM HCL 5 MG/5ML IJ SOLN
INTRAMUSCULAR | Status: DC | PRN
  Administered 2014-04-12 (×2): 1 mg via INTRAVENOUS

## 2014-04-12 MED ORDER — CLINDAMYCIN PHOSPHATE 900 MG/50ML IV SOLN
900.0000 mg | INTRAVENOUS | Status: AC
  Administered 2014-04-12: 900 mg via INTRAVENOUS

## 2014-04-12 MED ORDER — METOCLOPRAMIDE HCL 5 MG/ML IJ SOLN
INTRAMUSCULAR | Status: DC | PRN
  Administered 2014-04-12: 10 mg via INTRAVENOUS

## 2014-04-12 MED ORDER — BUPIVACAINE HCL 0.25 % IJ SOLN
INTRAMUSCULAR | Status: DC | PRN
  Administered 2014-04-12: 4 mL

## 2014-04-12 MED ORDER — FENTANYL CITRATE 0.05 MG/ML IJ SOLN
25.0000 ug | INTRAMUSCULAR | Status: DC | PRN
  Administered 2014-04-12: 50 ug via INTRAVENOUS

## 2014-04-12 MED ORDER — LACTATED RINGERS IV SOLN
INTRAVENOUS | Status: DC
  Administered 2014-04-12: 13:00:00 via INTRAVENOUS

## 2014-04-12 SURGICAL SUPPLY — 28 items
BAG ZIPLOCK 12X15 (MISCELLANEOUS) ×2 IMPLANT
BANDAGE ELASTIC 4 VELCRO ST LF (GAUZE/BANDAGES/DRESSINGS) ×2 IMPLANT
BNDG COHESIVE 3X5 TAN STRL LF (GAUZE/BANDAGES/DRESSINGS) ×2 IMPLANT
BNDG CONFORM 3 STRL LF (GAUZE/BANDAGES/DRESSINGS) ×2 IMPLANT
COVER SURGICAL LIGHT HANDLE (MISCELLANEOUS) ×2 IMPLANT
CUFF TOURN SGL QUICK 34 (TOURNIQUET CUFF) ×1
CUFF TRNQT CYL 34X4X40X1 (TOURNIQUET CUFF) ×1 IMPLANT
DECANTER SPIKE VIAL GLASS SM (MISCELLANEOUS) ×2 IMPLANT
DRSG EMULSION OIL 3X3 NADH (GAUZE/BANDAGES/DRESSINGS) ×2 IMPLANT
DURAPREP 26ML APPLICATOR (WOUND CARE) ×2 IMPLANT
ELECT REM PT RETURN 9FT ADLT (ELECTROSURGICAL) ×2
ELECTRODE REM PT RTRN 9FT ADLT (ELECTROSURGICAL) ×1 IMPLANT
GAUZE SPONGE 4X4 12PLY STRL (GAUZE/BANDAGES/DRESSINGS) ×2 IMPLANT
GLOVE BIO SURGEON STRL SZ7.5 (GLOVE) ×2 IMPLANT
GLOVE BIO SURGEON STRL SZ8 (GLOVE) ×4 IMPLANT
GLOVE ECLIPSE 8.0 STRL XLNG CF (GLOVE) ×2 IMPLANT
GLOVE INDICATOR 8.0 STRL GRN (GLOVE) ×4 IMPLANT
KIT BASIN OR (CUSTOM PROCEDURE TRAY) ×2 IMPLANT
MANIFOLD NEPTUNE II (INSTRUMENTS) ×2 IMPLANT
NEEDLE HYPO 25X1 1.5 SAFETY (NEEDLE) ×2 IMPLANT
NS IRRIG 1000ML POUR BTL (IV SOLUTION) ×2 IMPLANT
PACK ORTHO EXTREMITY (CUSTOM PROCEDURE TRAY) ×2 IMPLANT
POSITIONER SURGICAL ARM (MISCELLANEOUS) ×2 IMPLANT
SUT ETHILON 4 0 PS 2 18 (SUTURE) ×2 IMPLANT
SUT VIC AB 3-0 PS2 18 (SUTURE) ×1
SUT VIC AB 3-0 PS2 18XBRD (SUTURE) ×1 IMPLANT
SYR 20CC LL (SYRINGE) ×2 IMPLANT
WATER STERILE IRR 1500ML POUR (IV SOLUTION) ×2 IMPLANT

## 2014-04-12 NOTE — Transfer of Care (Signed)
Immediate Anesthesia Transfer of Care Note  Patient: Donald Gray  Procedure(s) Performed: Procedure(s): MASS EXCISION DORSUM OF RIGHT FOOT   (Right)  Patient Location: PACU  Anesthesia Type:MAC  Level of Consciousness: Patient easily awoken, sedated, comfortable, cooperative, following commands, responds to stimulation.   Airway & Oxygen Therapy: Patient spontaneously breathing, ventilating well, oxygen via simple oxygen mask.  Post-op Assessment: Report given to PACU RN, vital signs reviewed and stable, moving all extremities.   Post vital signs: Reviewed and stable.  Complications: No apparent anesthesia complications

## 2014-04-12 NOTE — Anesthesia Postprocedure Evaluation (Signed)
  Anesthesia Post-op Note  Patient: Donald Gray  Procedure(s) Performed: Procedure(s) (LRB): MASS EXCISION DORSUM OF RIGHT FOOT   (Right)  Patient Location: PACU  Anesthesia Type: MAC  Level of Consciousness: awake and alert   Airway and Oxygen Therapy: Patient Spontanous Breathing  Post-op Pain: mild  Post-op Assessment: Post-op Vital signs reviewed, Patient's Cardiovascular Status Stable, Respiratory Function Stable, Patent Airway and No signs of Nausea or vomiting  Last Vitals:  Filed Vitals:   04/12/14 1604  BP: 140/69  Pulse: 50  Temp: 36.3 C  Resp: 16    Post-op Vital Signs: stable   Complications: No apparent anesthesia complications

## 2014-04-12 NOTE — Interval H&P Note (Signed)
History and Physical Interval Note:  04/12/2014 1:09 PM  Donald Gray  has presented today for surgery, with the diagnosis of RIGHT FOOT MASS   The various methods of treatment have been discussed with the patient and family. After consideration of risks, benefits and other options for treatment, the patient has consented to  Procedure(s): MASS EXCISION DORSUM OF RIGHT FOOT   (Right) as a surgical intervention .  The patient's history has been reviewed, patient examined, no change in status, stable for surgery.  I have reviewed the patient's chart and labs.  Questions were answered to the patient's satisfaction.     Katira Dumais A

## 2014-04-12 NOTE — Progress Notes (Signed)
Wooden shoe applied to right foot by [employer]

## 2014-04-12 NOTE — Brief Op Note (Signed)
04/12/2014  2:25 PM  PATIENT:  [name]  73 y.o. male  PRE-OPERATIVE DIAGNOSIS:  RIGHT FOOT MASS   POST-OPERATIVE DIAGNOSIS:  right foot mass  PROCEDURE:  Procedure(s): MASS EXCISION DORSUM OF RIGHT FOOT   (Right)  SURGEON:  Surgeon(s) and Role:    * Tobi Bastos, MD - Primary     ASSISTANTS: OR Tech   ANESTHESIA:   local  EBL:  Total I/O In: 600 [I.V.:600] Out: -   BLOOD ADMINISTERED:none  DRAINS: none   LOCAL MEDICATIONS USED:  MARCAINE 0.25% 4cc   SPECIMEN:  Source of Specimen:  Dorsum of Right Foot  DISPOSITION OF SPECIMEN:  PATHOLOGY  COUNTS:  YES  TOURNIQUET:    DICTATION: .Other Dictation: Dictation Number [id number]  PLAN OF CARE: Discharge to home after PACU  PATIENT DISPOSITION:  PACU - hemodynamically stable.   Delay start of Pharmacological VTE agent (>24hrs) due to surgical blood loss or risk of bleeding: yes

## 2014-04-12 NOTE — Anesthesia Preprocedure Evaluation (Signed)
Anesthesia Evaluation  Patient identified by MRN, date of birth, ID band Patient awake    Reviewed: Allergy & Precautions, H&P , NPO status , Patient's Chart, lab work & pertinent test results  Airway Mallampati: II  TM Distance: >3 FB Neck ROM: Full    Dental no notable dental hx.    Pulmonary shortness of breath, pneumonia -, resolved, former smoker,  breath sounds clear to auscultation  Pulmonary exam normal       Cardiovascular hypertension, Pt. on medications and Pt. on home beta blockers + CAD and + Past MI + dysrhythmias Atrial Fibrillation Rhythm:Regular Rate:Normal     Neuro/Psych negative neurological ROS  negative psych ROS   GI/Hepatic Neg liver ROS, GERD-  ,  Endo/Other  negative endocrine ROSdiabetes  Renal/GU Renal disease  negative genitourinary   Musculoskeletal  (+) Arthritis -,   Abdominal (+) + obese,   Peds negative pediatric ROS (+)  Hematology  (+) anemia ,   Anesthesia Other Findings   Reproductive/Obstetrics negative OB ROS                             Anesthesia Physical Anesthesia Plan  ASA: III  Anesthesia Plan: MAC   Post-op Pain Management:    Induction: Intravenous  Airway Management Planned:   Additional Equipment:   Intra-op Plan:   Post-operative Plan:   Informed Consent: I have reviewed the patients History and Physical, chart, labs and discussed the procedure including the risks, benefits and alternatives for the proposed anesthesia with the patient or authorized representative who has indicated his/her understanding and acceptance.   Dental advisory given  Plan Discussed with: CRNA  Anesthesia Plan Comments:         Anesthesia Quick Evaluation

## 2014-04-13 NOTE — Op Note (Signed)
NAMEMORONI, Donald NO.:  [id number]  MEDICAL RECORD NO.:  [id number]  LOCATION:  [facility]                         FACILITY:  [facility]  PHYSICIAN:  Kipp Brood. Jaree Trinka, M.D.DATE OF BIRTH:  May 01, 1941  DATE OF PROCEDURE:  04/12/2014 DATE OF DISCHARGE:  04/12/2014                              OPERATIVE REPORT   SURGEON:  Jori Moll A. Tranell Wojtkiewicz, M.D.  ASSISTANT:  The OR tech.  OPERATION:  Excision of a mass over the dorsum of the right first metatarsal suture removal from the extensor tendon.  Note, he had previous extensor tendon surgery by another surgeon in the past.  DESCRIPTION OF PROCEDURE:  Under local anesthesia, I first did a routine orthopedic prep and draping and then a time-out.  Also marked the appropriate right foot in the holding area.  At this time, with standby anesthesia, I did a local injection around the mass site over the dorsum of the foot with a 0.25% Marcaine without epinephrine, used about 4 mL. At this time, I did elliptical excision of the mass down to the extensor tendon.  There were some synovial material there as well that I removed. Also noted the suture, literally was protruding from the extensor tendon.  I did remove that suture.  I then thoroughly irrigated the area.  I then closed the wound with 4-0 nylon suture.  A sterile Neosporin dressing was applied.  The patient left the operating room in satisfactory condition.  Prior to surgery, he had Cleocin 900 mg IV.  He will be discharged from the hospital.  He will be started on a Xarelto tomorrow morning.          ______________________________ Kipp Brood. Gladstone Lighter, M.D.     RAG/MEDQ  D:  04/12/2014  T:  04/13/2014  Job:  [id number]

## 2014-04-15 ENCOUNTER — Encounter (HOSPITAL_COMMUNITY): Payer: Self-pay | Admitting: Orthopedic Surgery

## 2014-06-17 ENCOUNTER — Telehealth: Payer: Self-pay | Admitting: Interventional Cardiology

## 2014-06-17 NOTE — Telephone Encounter (Signed)
New Msg     Request for surgical clearance:  1. What type of surgery is being performed? Knee replacement  2. When is this surgery scheduled? Will not be scheduled until clearance has been approved. Looking have March 28th as date   3. Are there any medications that need to be held prior to surgery and how long? Pt doesn't know  4. Name of physician performing surgery? Dr. Elsie Saas  5. What is your office phone and fax number? Telephone number or fax # avail at the moment.   Pt calling, states information has been faxed over a week ago from Dr. Noemi Chapel.  Please return pt call or fax info back to Dr. Noemi Chapel.

## 2014-06-17 NOTE — Telephone Encounter (Signed)
Routed to Dr.Smith for approval 

## 2014-06-18 NOTE — Telephone Encounter (Signed)
The patient is cleared for TKR surgery but will be at moderate risk for CV complications (7-9%). He will need to discontinue Xarelto 3 days prior to surgery.

## 2014-06-19 NOTE — Telephone Encounter (Signed)
Cardiac clearance faxed to [facility] attn: Dr.Wainer fax [phone]

## 2014-06-24 ENCOUNTER — Ambulatory Visit (INDEPENDENT_AMBULATORY_CARE_PROVIDER_SITE_OTHER): Payer: Medicare PPO | Admitting: Interventional Cardiology

## 2014-06-24 ENCOUNTER — Encounter: Payer: Self-pay | Admitting: Interventional Cardiology

## 2014-06-24 VITALS — BP 120/80 | HR 67 | Ht 71.0 in | Wt 215.2 lb

## 2014-06-24 DIAGNOSIS — I480 Paroxysmal atrial fibrillation: Secondary | ICD-10-CM

## 2014-06-24 DIAGNOSIS — I10 Essential (primary) hypertension: Secondary | ICD-10-CM

## 2014-06-24 DIAGNOSIS — C641 Malignant neoplasm of right kidney, except renal pelvis: Secondary | ICD-10-CM

## 2014-06-24 DIAGNOSIS — E785 Hyperlipidemia, unspecified: Secondary | ICD-10-CM

## 2014-06-24 DIAGNOSIS — I214 Non-ST elevation (NSTEMI) myocardial infarction: Secondary | ICD-10-CM

## 2014-06-24 NOTE — Patient Instructions (Signed)
Your physician recommends that you continue on your current medications as directed. Please refer to the Current Medication list given to you today.  You have been cleared for your upcoming surgery with Dr.Wainer. Hold Xarelto 72 hours prior to surgery, resume when it is safe to do so.  Your physician wants you to follow-up in: 1 year with Dr.Smith You will receive a reminder letter in the mail two months in advance. If you don't receive a letter, please call our office to schedule the follow-up appointment.

## 2014-06-24 NOTE — Progress Notes (Signed)
Cardiology Office Note   Date:  06/24/2014   ID:  Donald Gray, DOB 1940-06-19, MRN [id number]  PCP:  Irven Shelling, MD  Cardiologist:   Sinclair Grooms, MD   No chief complaint on file.     History of Present Illness: Donald Gray is a 74 y.o. male who presents for cardiovascular follow-up and clearance for upcoming knee surgery by Dr. Elsie Saas. He has no cardiopulmonary complaints. He is having marked difficulty with ambulation due to right knee osteoarthritis. No recent falls. No blood in the urine or stool. He is denies sublingual nitroglycerin. He denies any change in chronic mild dyspnea.    Past Medical History  Diagnosis Date  . Hypertension   . Renal cell carcinoma     a. s/p nephrectomy  . Coronary artery disease     a. s/p MI with stent in 2000  . Hyperlipidemia   . Shortness of breath     a. related to chemo drugs  . Anemia   . GERD (gastroesophageal reflux disease)   . Diverticulosis   . PAF (paroxysmal atrial fibrillation)   . Myocardial infarction 1997  . Type II diabetes mellitus     "treating w/diet and weight loss"  . History of blood transfusion     "w/nephrectomy; w/shoulder replacement; w/knee replacement"  . Arthritis     "fingers, toes" (10/05/2013)  . Left knee DJD   . Anticoagulated- to go to Xarelto for a fib 10/07/2013  . NSTEMI (non-ST elevated myocardial infarction), type 2 due to demand ischemia 10/07/2013  . Dysrhythmia   . Neuropathy     feet bilat   . Pneumonia ~ 2007    again 3 months ago   . Tendonitis     currently on prednisone and wearing brace (left wrist)    Past Surgical History  Procedure Laterality Date  . Nephrectomy Right 08/14/2009  . Knee arthroscopy Right ~ 2011  . Pilonidal cyst drainage    . Total shoulder replacement Right 1997  . Joint replacement    . Inguinal hernia repair  09/22/2011    Procedure: HERNIA REPAIR INGUINAL ADULT BILATERAL;  Surgeon: Imogene Burn. Georgette Dover, MD;  Location: [facility];  Service: General;  Laterality: Bilateral;  . Orchiectomy  09/22/2011    Procedure: ORCHIECTOMY;  Surgeon: Franchot Gallo, MD;  [facility];  Service: Urology;  Laterality: Left;  Left Testicular Exploration, Inguinal Approach,  Testicular   . Knee arthroscopy Left 11/13/2012    Procedure: ARTHROSCOPY KNEE;  Surgeon: Lorn Junes, MD;  Location: [place];  Service: Orthopedics;  Laterality: Left;  . Total knee arthroplasty Left 11/13/2012    Procedure: TOTAL KNEE ARTHROPLASTY;  Surgeon: Lorn Junes, MD;  Location: [place];  Service: Orthopedics;  Laterality: Left;  . Cheilectomy Right 04/13/2013    Procedure: Right Great Toe Cheilectomy, Debride Tendon and Abscess, Apply Stimulan Beads;  Surgeon: Newt Minion, MD;  Location: [place];  Service: Orthopedics;  Laterality: Right;  Right Great Toe Cheilectomy, Debride Tendon and Abscess, Apply Stimulan Beads  . Inguinal hernia repair Bilateral 09/22/11  . Tonsillectomy and adenoidectomy  1948  . Cardiac catheterization  05/2009  . Coronary angioplasty with stent placement  1997    at [facility] to RCA   . Lung biopsy  2011  . Tee without cardioversion N/A 10/08/2013    Procedure: TRANSESOPHAGEAL ECHOCARDIOGRAM (TEE);  Surgeon: Dorothy Spark, MD;  Location: [place];  Service: Cardiovascular;  Laterality: N/A;  .  Cardioversion N/A 10/08/2013    Procedure: CARDIOVERSION;  Surgeon: Dorothy Spark, MD;  Location: [place];  Service: Cardiovascular;  Laterality: N/A;  . Colonscopy     . Mass excision Right 04/12/2014    Procedure: MASS EXCISION DORSUM OF RIGHT FOOT  ;  Surgeon: Tobi Bastos, MD;  [facility];  Service: Orthopedics;  Laterality: Right;     Current Outpatient Prescriptions  Medication Sig Dispense Refill  . atorvastatin (LIPITOR) 10 MG tablet Take 10 mg by mouth daily with breakfast.     . azelastine (ASTELIN) 137 MCG/SPRAY nasal spray Place 1 spray into the nose every morning. For congestion; Use in each  nostril as directed    . cetirizine (ZYRTEC) 10 MG tablet Take 10 mg by mouth daily. As needed  Alternates monthly between this and Allegra Pt currently taking Allegra    . cholecalciferol (VITAMIN D) 1000 UNITS tablet Take 1,000 Units by mouth daily.    . Coenzyme Q10 (COQ10) 100 MG CAPS Take 100 mg by mouth daily.    . cyanocobalamin 100 MCG tablet Take 100 mcg by mouth daily.    Marland Kitchen EPINEPHrine (EPI-PEN) 0.3 mg/0.3 mL SOAJ Inject 0.3 mg into the muscle once.    . fexofenadine (ALLEGRA) 180 MG tablet Take 180 mg by mouth daily with breakfast.     . fluticasone (FLONASE) 50 MCG/ACT nasal spray Place 2 sprays into the nose every evening. Do not use while on "IL2"    . magnesium gluconate (MAGONATE) 500 MG tablet Take 500 mg by mouth 2 (two) times daily.     . metoprolol tartrate (LOPRESSOR) 25 MG tablet Take 1 tablet (25 mg total) by mouth 2 (two) times daily. 60 tablet 6  . Multiple Vitamin (MULTIVITAMIN WITH MINERALS) TABS Take 1 tablet by mouth daily.    . nitroGLYCERIN (NITROSTAT) 0.4 MG SL tablet Place 0.4 mg under the tongue every 5 (five) minutes as needed for chest pain.    Vladimir Faster Glycol-Propyl Glycol (SYSTANE) 0.4-0.3 % SOLN Place 1 drop into both eyes daily as needed (dry eyes).    . ranitidine (ZANTAC) 75 MG tablet Take 150 mg by mouth daily as needed for heartburn.     . rivaroxaban (XARELTO) 20 MG TABS tablet Take 1 tablet (20 mg total) by mouth daily with supper. 30 tablet 0  . vitamin C (ASCORBIC ACID) 500 MG tablet Take 500 mg by mouth daily.      No current facility-administered medications for this visit.    Allergies:   Penicillins; Ambien; and Codeine    Social History:  The patient  reports that he quit smoking about 53 years ago. His smoking use included Cigarettes. He has a 2 pack-year smoking history. He has never used smokeless tobacco. He reports that he drinks about 0.6 oz of alcohol per week. He reports that he does not use illicit drugs.   Family History:   The patient's family history includes Dementia in his mother; Healthy in his daughter; Heart disease in his father; Hypertension in his father; Skin cancer in his son; Thyroid disease in his brother and mother.    ROS:  Please see the history of present illness.   Otherwise, review of systems are positive for none.   All other systems are reviewed and negative.    PHYSICAL EXAM: VS:  BP 120/80 mmHg  Pulse 67  Ht 5\' 11"  (1.803 m)  Wt 215 lb 3.2 oz (97.614 kg)  BMI 30.03 kg/m2  SpO2  98% , BMI Body mass index is 30.03 kg/(m^2). GEN: Well nourished, well developed, in no acute distress HEENT: normal Neck: no JVD, carotid bruits, or masses Cardiac: RRR; no murmurs, rubs, or gallops,no edema  Respiratory:  clear to auscultation bilaterally, normal work of breathing GI: soft, nontender, nondistended, + BS MS: no deformity or atrophy Skin: warm and dry, no rash Neuro:  Strength and sensation are intact Psych: euthymic mood, full affect   EKG:  EKG is not ordered today.    Recent Labs: 10/05/2013: Magnesium 2.1; Pro B Natriuretic peptide (BNP) 2582.0*; TSH 1.550 04/05/2014: BUN 32*; Creatinine 1.40*; Hemoglobin 14.6; Platelets 189; Potassium 4.4; Sodium 137    Lipid Panel    Component Value Date/Time   CHOL 134 10/06/2013 0442   TRIG 73 10/06/2013 0442   HDL 43 10/06/2013 0442   CHOLHDL 3.1 10/06/2013 0442   VLDL 15 10/06/2013 0442   LDLCALC 76 10/06/2013 0442      Wt Readings from Last 3 Encounters:  06/24/14 215 lb 3.2 oz (97.614 kg)  04/12/14 216 lb (97.977 kg)  12/26/13 203 lb 6.4 oz (92.262 kg)      Other studies Reviewed: Additional studies/ records that were reviewed today include: none   ASSESSMENT AND PLAN:  1.  Preoperative cardiovascular clearance, cleared for upcoming TKR Noemi Chapel)  2. Coronary artery disease, asymptomatic 3. Paroxysmal atrial fibrillation, asymptomatic 4. Chronic anticoagulation therapy, Xarelto without bleeding complications 5.  Essential hypertension, with borderline control   Current medicines are reviewed at length with the patient today.  The patient has concerns regarding medicines.  The following changes have been made:  He is cleared for the upcoming orthopedic and neurosurgical surgical procedure on the right knee by Dr. Elsie Saas. He should hold Xarelto at least 72 hours prior to the procedure. Xarelto should be resumed when safe based upon Dr. Archie Endo judgment.  Labs/ tests ordered today include:  No orders of the defined types were placed in this encounter.     Disposition:   FU with Linard Millers in 12 months   Signed, Sinclair Grooms, MD  06/24/2014 11:38 AM    [facility] [address], [place], [place]  [address] Phone: [phone]; Fax: [phone]

## 2014-07-10 ENCOUNTER — Encounter (HOSPITAL_COMMUNITY): Payer: Self-pay | Admitting: Physician Assistant

## 2014-07-10 DIAGNOSIS — M1711 Unilateral primary osteoarthritis, right knee: Secondary | ICD-10-CM | POA: Diagnosis present

## 2014-07-10 NOTE — H&P (Signed)
TOTAL KNEE ADMISSION H&P  Patient is being admitted for right total knee arthroplasty.  Subjective:  Chief Complaint:right knee pain.  HPI: Donald Gray, 74 y.o. male, has a history of pain and functional disability in the right knee due to arthritis and has failed non-surgical conservative treatments for greater than 12 weeks to includeNSAID's and/or analgesics, corticosteriod injections, viscosupplementation injections, flexibility and strengthening excercises, supervised PT with diminished ADL's post treatment and activity modification.  Onset of symptoms was gradual, starting 10 years ago with gradually worsening course since that time. The patient noted no past surgery on the right knee(s).  Patient currently rates pain in the right knee(s) at 10 out of 10 with activity. Patient has night pain, worsening of pain with activity and weight bearing, pain that interferes with activities of daily living, crepitus and joint swelling.  Patient has evidence of subchondral sclerosis, periarticular osteophytes and joint space narrowing by imaging studies.  There is no active infection.  Patient Active Problem List   Diagnosis Date Noted  . Primary localized osteoarthritis of right knee   . Acute on chronic diastolic heart failure 35/57/3220  . Anticoagulated- to go to Xarelto for a fib 10/07/2013  . NSTEMI (non-ST elevated myocardial infarction), type 2 due to demand ischemia 10/07/2013  . Atrial fibrillation with RVR 10/07/2013  . Atrial fibrillation 10/05/2013  . Renal cell carcinoma   . PAF (paroxysmal atrial fibrillation), 10/08/13 TEE and DCCV to SR   . Abscess, toe 04/13/2013  . Left knee DJD   . Renal disorder   . Diabetes, diet controlled   . H/O interleukin-2 treatment   . Diverticulosis   . Arthritis   . GERD (gastroesophageal reflux disease)   . Anemia   . Heart attack   . Hyperlipidemia   . Coronary artery disease, hx stent 1995   . Cancer   . Hypertension   . Obesity  01/23/2012  . Dyspnea 12/19/2011  . Bilateral inguinal hernia 08/17/2011   Past Medical History  Diagnosis Date  . Hypertension   . Renal cell carcinoma     a. s/p nephrectomy  . Coronary artery disease     a. s/p MI with stent in 2000  . Hyperlipidemia   . Shortness of breath     a. related to chemo drugs  . Anemia   . GERD (gastroesophageal reflux disease)   . Diverticulosis   . PAF (paroxysmal atrial fibrillation)   . Myocardial infarction 1997  . Type II diabetes mellitus     "treating w/diet and weight loss"  . History of blood transfusion     "w/nephrectomy; w/shoulder replacement; w/knee replacement"  . Arthritis     "fingers, toes" (10/05/2013)  . Left knee DJD   . Anticoagulated- to go to Xarelto for a fib 10/07/2013  . NSTEMI (non-ST elevated myocardial infarction), type 2 due to demand ischemia 10/07/2013  . Dysrhythmia   . Neuropathy     feet bilat   . Pneumonia ~ 2007    again 3 months ago   . Tendonitis     currently on prednisone and wearing brace (left wrist)  . Primary localized osteoarthritis of right knee     Past Surgical History  Procedure Laterality Date  . Nephrectomy Right 08/14/2009  . Knee arthroscopy Right ~ 2011  . Pilonidal cyst drainage    . Total shoulder replacement Right 1997  . Joint replacement    . Inguinal hernia repair  09/22/2011    Procedure: HERNIA REPAIR  INGUINAL ADULT BILATERAL;  Surgeon: Imogene Burn. Georgette Dover, MD;  [facility];  Service: General;  Laterality: Bilateral;  . Orchiectomy  09/22/2011    Procedure: ORCHIECTOMY;  Surgeon: Franchot Gallo, MD;  [facility];  Service: Urology;  Laterality: Left;  Left Testicular Exploration, Inguinal Approach,  Testicular   . Knee arthroscopy Left 11/13/2012    Procedure: ARTHROSCOPY KNEE;  Surgeon: Lorn Junes, MD;  Location: [place];  Service: Orthopedics;  Laterality: Left;  . Total knee arthroplasty Left 11/13/2012    Procedure: TOTAL KNEE ARTHROPLASTY;  Surgeon: Lorn Junes, MD;  Location: [place];  Service: Orthopedics;  Laterality: Left;  . Cheilectomy Right 04/13/2013    Procedure: Right Great Toe Cheilectomy, Debride Tendon and Abscess, Apply Stimulan Beads;  Surgeon: Newt Minion, MD;  Location: [place];  Service: Orthopedics;  Laterality: Right;  Right Great Toe Cheilectomy, Debride Tendon and Abscess, Apply Stimulan Beads  . Inguinal hernia repair Bilateral 09/22/11  . Tonsillectomy and adenoidectomy  1948  . Cardiac catheterization  05/2009  . Coronary angioplasty with stent placement  1997    at [facility] to RCA   . Lung biopsy  2011  . Tee without cardioversion N/A 10/08/2013    Procedure: TRANSESOPHAGEAL ECHOCARDIOGRAM (TEE);  Surgeon: Dorothy Spark, MD;  Location: [place];  Service: Cardiovascular;  Laterality: N/A;  . Cardioversion N/A 10/08/2013    Procedure: CARDIOVERSION;  Surgeon: Dorothy Spark, MD;  Location: [place];  Service: Cardiovascular;  Laterality: N/A;  . Colonscopy     . Mass excision Right 04/12/2014    Procedure: MASS EXCISION DORSUM OF RIGHT FOOT  ;  Surgeon: Tobi Bastos, MD;  [facility];  Service: Orthopedics;  Laterality: Right;    No prescriptions prior to admission  No current facility-administered medications for this encounter.  Current outpatient prescriptions:  .  atorvastatin (LIPITOR) 10 MG tablet, Take 10 mg by mouth daily with breakfast. , Disp: , Rfl:  .  azelastine (ASTELIN) 137 MCG/SPRAY nasal spray, Place 1 spray into the nose every morning. For congestion; Use in each nostril as directed, Disp: , Rfl:  .  cetirizine (ZYRTEC) 10 MG tablet, Take 10 mg by mouth daily. As needed  Alternates monthly between this and Allegra Pt currently taking Allegra, Disp: , Rfl:  .  cholecalciferol (VITAMIN D) 1000 UNITS tablet, Take 1,000 Units by mouth daily., Disp: , Rfl:  .  cyanocobalamin 100 MCG tablet, Take 100 mcg by mouth daily., Disp: , Rfl:  .  EPINEPHrine (EPI-PEN) 0.3 mg/0.3 mL SOAJ, Inject 0.3 mg  into the muscle once., Disp: , Rfl:  .  fexofenadine (ALLEGRA) 180 MG tablet, Take 180 mg by mouth daily with breakfast. , Disp: , Rfl:  .  fluticasone (FLONASE) 50 MCG/ACT nasal spray, Place 2 sprays into the nose every evening. Do not use while on "IL2", Disp: , Rfl:  .  metoprolol tartrate (LOPRESSOR) 25 MG tablet, Take 1 tablet (25 mg total) by mouth 2 (two) times daily., Disp: 60 tablet, Rfl: 6 .  Multiple Vitamin (MULTIVITAMIN WITH MINERALS) TABS, Take 1 tablet by mouth daily., Disp: , Rfl:  .  Polyethyl Glycol-Propyl Glycol (SYSTANE) 0.4-0.3 % SOLN, Place 1 drop into both eyes daily as needed (dry eyes)., Disp: , Rfl:  .  ranitidine (ZANTAC) 75 MG tablet, Take 150 mg by mouth daily as needed for heartburn. , Disp: , Rfl:  .  rivaroxaban (XARELTO) 20 MG TABS tablet, Take 1 tablet (20 mg  total) by mouth daily with supper., Disp: 30 tablet, Rfl: 0 .  vitamin C (ASCORBIC ACID) 500 MG tablet, Take 500 mg by mouth daily. , Disp: , Rfl:  .  Coenzyme Q10 (COQ10) 100 MG CAPS, Take 100 mg by mouth daily., Disp: , Rfl:  .  magnesium gluconate (MAGONATE) 500 MG tablet, Take 500 mg by mouth 2 (two) times daily. , Disp: , Rfl:  .  nitroGLYCERIN (NITROSTAT) 0.4 MG SL tablet, Place 0.4 mg under the tongue every 5 (five) minutes as needed for chest pain., Disp: , Rfl:  Allergies  Allergen Reactions  . Penicillins Swelling  . Ambien [Zolpidem Tartrate] Other (See Comments)    Causes hallucinations.   . Codeine     hallucinations     History  Substance Use Topics  . Smoking status: Former Smoker -- 1.00 packs/day for 2 years    Types: Cigarettes    Quit date: 05/03/1961  . Smokeless tobacco: Never Used  . Alcohol Use: 0.6 oz/week    1 Glasses of wine per week     Comment: occas glass of wine every 2 wks    Family History  Problem Relation Age of Onset  . Heart disease Father     Died, 41  . Thyroid disease Mother     Living, 67  . Thyroid disease Brother   . Dementia Mother   . Hypertension  Father   . Healthy Daughter   . Skin cancer Son      Review of Systems  Constitutional: Negative.   HENT: Negative.   Eyes: Negative.   Respiratory: Negative.   Cardiovascular: Negative.   Gastrointestinal: Negative.   Genitourinary: Negative.   Musculoskeletal: Positive for back pain and joint pain. Negative for myalgias, falls and neck pain.  Skin: Negative.   Neurological: Negative.   Endo/Heme/Allergies: Negative.   Psychiatric/Behavioral: Negative.     Objective:  Physical Exam  Constitutional: He is oriented to person, place, and time. He appears well-developed and well-nourished.  HENT:  Head: Normocephalic and atraumatic.  Eyes: Conjunctivae and EOM are normal. Pupils are equal, round, and reactive to light.  Neck: Neck supple.  Cardiovascular: Normal rate.   Respiratory: Effort normal.  GI: Soft.  Genitourinary:  Not pertinent to current symptomatology therefore not examined.  Musculoskeletal:  Examination of his right knee reveals pain medially and laterally, 1+ crepitation 1+ synovitis, diffuse pain full range of motion knee is stable with normal patella tracking. Exam of the left knee reveals well healed total knee incision with minimal pain minimal swelling, full range of motion knee is stable with normal patella tracking. Vascular exam: pulses 2+ and symmetric.  Neurological: He is alert and oriented to person, place, and time.  Skin: Skin is warm and dry.  Psychiatric: He has a normal mood and affect. His behavior is normal.    Vital signs in last 24 hours:    Labs:   Estimated body mass index is 30.03 kg/(m^2) as calculated from the following:   Height as of 04/12/14: 5\' 11"  (1.803 m).   Weight as of 06/24/14: 97.614 kg (215 lb 3.2 oz).   Imaging Review Plain radiographs demonstrate severe degenerative joint disease of the right knee(s). The overall alignment issignificant varus. The bone quality appears to be good for age and reported activity  level.  Assessment/Plan:  End stage arthritis, right knee   The patient history, physical examination, clinical judgment of the provider and imaging studies are consistent with end stage degenerative joint  disease of the right knee(s) and total knee arthroplasty is deemed medically necessary. The treatment options including medical management, injection therapy arthroscopy and arthroplasty were discussed at length. The risks and benefits of total knee arthroplasty were presented and reviewed. The risks due to aseptic loosening, infection, stiffness, patella tracking problems, thromboembolic complications and other imponderables were discussed. The patient acknowledged the explanation, agreed to proceed with the plan and consent was signed. Patient is being admitted for inpatient treatment for surgery, pain control, PT, OT, prophylactic antibiotics, VTE prophylaxis, progressive ambulation and ADL's and discharge planning. The patient is planning to be discharged to skilled nursing facility [facility]. Kaleen Mask Physician Assistant [facility] [phone]  07/10/2014, 3:03 PM

## 2014-07-11 NOTE — Pre-Procedure Instructions (Signed)
Donald Gray  07/11/2014   Your procedure is scheduled on:  Mon, Mar 21 @ 10:00 AM  Report to [facility]  at 8:00 AM.  Call this number if you have problems the morning of surgery: [phone]   Remember:   Do not eat food or drink liquids after midnight.   Take these medicines the morning of surgery with A SIP OF WATER: Astelin(Azelastine),Zyrtec(Cetirizine),Allegra(Fexofenadine),Flonase(Fluticasone), Metoprolol(Lopressor),and Zantac(Ranitidine)               Stop taking your Xarelto,CO Q10,any Vitamins. No Goody's,BC's,Aleve,Aspirin,Ibuprofen,Fish Oil,or any Herbal Medications.   Do not wear jewelry.  Do not wear lotions, powders, or perfumes. You may wear deodorant.  Men may shave face and neck.  Do not bring valuables to the hospital.  [facility] is not responsible                  for any belongings or valuables.               Contacts, dentures or bridgework may not be worn into surgery.  Leave suitcase in the car. After surgery it may be brought to your room.  For patients admitted to the hospital, discharge time is determined by your                treatment team.                Special Instructions:  [place] - Preparing for Surgery  Before surgery, you can play an important role.  Because skin is not sterile, your skin needs to be as free of germs as possible.  You can reduce the number of germs on you skin by washing with CHG (chlorahexidine gluconate) soap before surgery.  CHG is an antiseptic cleaner which kills germs and bonds with the skin to continue killing germs even after washing.  Please DO NOT use if you have an allergy to CHG or antibacterial soaps.  If your skin becomes reddened/irritated stop using the CHG and inform your nurse when you arrive at Short Stay.  Do not shave (including legs and underarms) for at least 48 hours prior to the first CHG shower.  You may shave your face.  Please follow these instructions carefully:   1.   Shower with CHG Soap the night before surgery and the                                morning of Surgery.  2.  If you choose to wash your hair, wash your hair first as usual with your       normal shampoo.  3.  After you shampoo, rinse your hair and body thoroughly to remove the                      Shampoo.  4.  Use CHG as you would any other liquid soap.  You can apply chg directly       to the skin and wash gently with scrungie or a clean washcloth.  5.  Apply the CHG Soap to your body ONLY FROM THE NECK DOWN.        Do not use on open wounds or open sores.  Avoid contact with your eyes,       ears, mouth and genitals (private parts).  Wash genitals (private parts)       with your normal soap.  6.  Wash thoroughly, paying special attention to the area where your surgery        will be performed.  7.  Thoroughly rinse your body with warm water from the neck down.  8.  DO NOT shower/wash with your normal soap after using and rinsing off       the CHG Soap.  9.  Pat yourself dry with a clean towel.            10.  Wear clean pajamas.            11.  Place clean sheets on your bed the night of your first shower and do not        sleep with pets.  Day of Surgery  Do not apply any lotions/deoderants the morning of surgery.  Please wear clean clothes to the hospital/surgery center.     Please read over the following fact sheets that you were given: Pain Booklet, Coughing and Deep Breathing, Blood Transfusion Information, MRSA Information and Surgical Site Infection Prevention

## 2014-07-12 ENCOUNTER — Encounter (HOSPITAL_COMMUNITY): Payer: Self-pay

## 2014-07-12 ENCOUNTER — Encounter (HOSPITAL_COMMUNITY)
Admission: RE | Admit: 2014-07-12 | Discharge: 2014-07-12 | Disposition: A | Discharge status: Home / Self Care | Payer: Medicare PPO | Source: Ambulatory Visit | Attending: Orthopedic Surgery | Admitting: Orthopedic Surgery

## 2014-07-12 DIAGNOSIS — Z01812 Encounter for preprocedural laboratory examination: Secondary | ICD-10-CM | POA: Diagnosis present

## 2014-07-12 LAB — CBC WITH DIFFERENTIAL/PLATELET
BASOS ABS: 0.1 10*3/uL (ref 0.0–0.1)
Basophils Relative: 1 % (ref 0–1)
Eosinophils Absolute: 0.1 10*3/uL (ref 0.0–0.7)
Eosinophils Relative: 2 % (ref 0–5)
HEMATOCRIT: 44.2 % (ref 39.0–52.0)
HEMOGLOBIN: 15.3 g/dL (ref 13.0–17.0)
LYMPHS ABS: 1.7 10*3/uL (ref 0.7–4.0)
Lymphocytes Relative: 30 % (ref 12–46)
MCH: 30.4 pg (ref 26.0–34.0)
MCHC: 34.6 g/dL (ref 30.0–36.0)
MCV: 87.7 fL (ref 78.0–100.0)
Monocytes Absolute: 0.5 10*3/uL (ref 0.1–1.0)
Monocytes Relative: 9 % (ref 3–12)
NEUTROS ABS: 3.3 10*3/uL (ref 1.7–7.7)
NEUTROS PCT: 58 % (ref 43–77)
PLATELETS: 167 10*3/uL (ref 150–400)
RBC: 5.04 MIL/uL (ref 4.22–5.81)
RDW: 13.9 % (ref 11.5–15.5)
WBC: 5.7 10*3/uL (ref 4.0–10.5)

## 2014-07-12 LAB — URINE MICROSCOPIC-ADD ON

## 2014-07-12 LAB — SURGICAL PCR SCREEN
MRSA, PCR: NEGATIVE
Staphylococcus aureus: NEGATIVE

## 2014-07-12 LAB — APTT: aPTT: 39 seconds — ABNORMAL HIGH (ref 24–37)

## 2014-07-12 LAB — URINALYSIS, ROUTINE W REFLEX MICROSCOPIC
Bilirubin Urine: NEGATIVE
Glucose, UA: NEGATIVE mg/dL
KETONES UR: NEGATIVE mg/dL
LEUKOCYTES UA: NEGATIVE
Nitrite: NEGATIVE
Protein, ur: 30 mg/dL — AB
Specific Gravity, Urine: 1.02 (ref 1.005–1.030)
UROBILINOGEN UA: 0.2 mg/dL (ref 0.0–1.0)
pH: 6.5 (ref 5.0–8.0)

## 2014-07-12 LAB — COMPREHENSIVE METABOLIC PANEL
ALBUMIN: 4.2 g/dL (ref 3.5–5.2)
ALK PHOS: 79 U/L (ref 39–117)
ALT: 40 U/L (ref 0–53)
AST: 25 U/L (ref 0–37)
Anion gap: 8 (ref 5–15)
BUN: 20 mg/dL (ref 6–23)
CO2: 23 mmol/L (ref 19–32)
Calcium: 9.2 mg/dL (ref 8.4–10.5)
Chloride: 106 mmol/L (ref 96–112)
Creatinine, Ser: 1.23 mg/dL (ref 0.50–1.35)
GFR calc Af Amer: 65 mL/min — ABNORMAL LOW (ref >90)
GFR, EST NON AFRICAN AMERICAN: 56 mL/min — AB (ref >90)
Glucose, Bld: 142 mg/dL — ABNORMAL HIGH (ref 70–99)
POTASSIUM: 4.2 mmol/L (ref 3.5–5.1)
SODIUM: 137 mmol/L (ref 135–145)
Total Bilirubin: 0.8 mg/dL (ref 0.3–1.2)
Total Protein: 7.1 g/dL (ref 6.0–8.3)

## 2014-07-12 LAB — TYPE AND SCREEN
ABO/RH(D): A POS
Antibody Screen: NEGATIVE

## 2014-07-12 LAB — PROTIME-INR
INR: 1.37 (ref 0.00–1.49)
PROTHROMBIN TIME: 17 s — AB (ref 11.6–15.2)

## 2014-07-13 LAB — URINE CULTURE
CULTURE: NO GROWTH
Colony Count: NO GROWTH

## 2014-07-21 MED ORDER — VANCOMYCIN HCL IN DEXTROSE 1-5 GM/200ML-% IV SOLN: 1000.0000 mg | INTRAVENOUS | Status: DC

## 2014-07-21 MED ORDER — LACTATED RINGERS IV SOLN: INTRAVENOUS | Status: DC

## 2014-07-21 NOTE — Anesthesia Preprocedure Evaluation (Addendum)
Anesthesia Evaluation  Patient identified by MRN, date of birth, ID band Patient awake    Reviewed: Allergy & Precautions, NPO status , Patient's Chart, lab work & pertinent test results  Airway Mallampati: II   Neck ROM: Full    Dental  (+) Dental Advisory Given, Teeth Intact   Pulmonary shortness of breath, former smoker,  breath sounds clear to auscultation        Cardiovascular hypertension, Pt. on medications + dysrhythmias Atrial Fibrillation Rhythm:Regular  STENT 1997 RCA, HX of AF, sinus 10/2013, ECHO 10/2013 EF55%, To hold Xarelto    Neuro/Psych    GI/Hepatic Neg liver ROS, GERD-  Medicated,  Endo/Other  diabetes, Type 2  Renal/GU Renal InsufficiencyRenal diseaseCreat 1.23     Musculoskeletal   Abdominal (+) - obese,  Abdomen: soft.    Peds  Hematology 15/44   Anesthesia Other Findings   Reproductive/Obstetrics                            Anesthesia Physical Anesthesia Plan  ASA: III  Anesthesia Plan: General   Post-op Pain Management: MAC Combined w/ Regional for Post-op pain   Induction: Intravenous  Airway Management Planned: Oral ETT  Additional Equipment:   Intra-op Plan:   Post-operative Plan: Extubation in OR  Informed Consent: I have reviewed the patients History and Physical, chart, labs and discussed the procedure including the risks, benefits and alternatives for the proposed anesthesia with the patient or authorized representative who has indicated his/her understanding and acceptance.     Plan Discussed with:   Anesthesia Plan Comments: (Verify holding Xarelto, check pulse for AF, had GA and Fem block for L TKA 2014)        Anesthesia Quick Evaluation

## 2014-07-22 ENCOUNTER — Inpatient Hospital Stay (HOSPITAL_COMMUNITY): Payer: Medicare PPO | Admitting: Anesthesiology

## 2014-07-22 ENCOUNTER — Inpatient Hospital Stay (HOSPITAL_COMMUNITY)
Admission: RE | Admit: 2014-07-22 | Discharge: 2014-07-24 | DRG: 470 | Disposition: A | Discharge status: Skilled Nursing Facility | Payer: Medicare PPO | Source: Ambulatory Visit | Attending: Orthopedic Surgery | Admitting: Orthopedic Surgery

## 2014-07-22 ENCOUNTER — Encounter (HOSPITAL_COMMUNITY)
Admission: RE | Disposition: A | Discharge status: Skilled Nursing Facility | Payer: Self-pay | Source: Ambulatory Visit | Attending: Orthopedic Surgery

## 2014-07-22 ENCOUNTER — Encounter (HOSPITAL_COMMUNITY): Payer: Self-pay | Admitting: Anesthesiology

## 2014-07-22 DIAGNOSIS — E785 Hyperlipidemia, unspecified: Secondary | ICD-10-CM | POA: Diagnosis present

## 2014-07-22 DIAGNOSIS — Z96651 Presence of right artificial knee joint: Secondary | ICD-10-CM | POA: Diagnosis present

## 2014-07-22 DIAGNOSIS — I2510 Atherosclerotic heart disease of native coronary artery without angina pectoris: Secondary | ICD-10-CM | POA: Diagnosis present

## 2014-07-22 DIAGNOSIS — M25561 Pain in right knee: Secondary | ICD-10-CM | POA: Diagnosis present

## 2014-07-22 DIAGNOSIS — Z880 Allergy status to penicillin: Secondary | ICD-10-CM

## 2014-07-22 DIAGNOSIS — I480 Paroxysmal atrial fibrillation: Secondary | ICD-10-CM | POA: Diagnosis present

## 2014-07-22 DIAGNOSIS — Z6829 Body mass index (BMI) 29.0-29.9, adult: Secondary | ICD-10-CM | POA: Diagnosis not present

## 2014-07-22 DIAGNOSIS — I9589 Other hypotension: Secondary | ICD-10-CM | POA: Diagnosis not present

## 2014-07-22 DIAGNOSIS — Z905 Acquired absence of kidney: Secondary | ICD-10-CM | POA: Diagnosis present

## 2014-07-22 DIAGNOSIS — I9581 Postprocedural hypotension: Secondary | ICD-10-CM | POA: Diagnosis not present

## 2014-07-22 DIAGNOSIS — Z96652 Presence of left artificial knee joint: Secondary | ICD-10-CM | POA: Diagnosis present

## 2014-07-22 DIAGNOSIS — I252 Old myocardial infarction: Secondary | ICD-10-CM | POA: Diagnosis present

## 2014-07-22 DIAGNOSIS — G629 Polyneuropathy, unspecified: Secondary | ICD-10-CM | POA: Diagnosis present

## 2014-07-22 DIAGNOSIS — Z8249 Family history of ischemic heart disease and other diseases of the circulatory system: Secondary | ICD-10-CM | POA: Diagnosis not present

## 2014-07-22 DIAGNOSIS — Z79899 Other long term (current) drug therapy: Secondary | ICD-10-CM | POA: Diagnosis not present

## 2014-07-22 DIAGNOSIS — I10 Essential (primary) hypertension: Secondary | ICD-10-CM | POA: Diagnosis present

## 2014-07-22 DIAGNOSIS — I4891 Unspecified atrial fibrillation: Secondary | ICD-10-CM | POA: Diagnosis present

## 2014-07-22 DIAGNOSIS — Z888 Allergy status to other drugs, medicaments and biological substances status: Secondary | ICD-10-CM

## 2014-07-22 DIAGNOSIS — Z955 Presence of coronary angioplasty implant and graft: Secondary | ICD-10-CM

## 2014-07-22 DIAGNOSIS — Z9221 Personal history of antineoplastic chemotherapy: Secondary | ICD-10-CM

## 2014-07-22 DIAGNOSIS — K219 Gastro-esophageal reflux disease without esophagitis: Secondary | ICD-10-CM | POA: Diagnosis present

## 2014-07-22 DIAGNOSIS — I248 Other forms of acute ischemic heart disease: Secondary | ICD-10-CM | POA: Diagnosis present

## 2014-07-22 DIAGNOSIS — Z85528 Personal history of other malignant neoplasm of kidney: Secondary | ICD-10-CM

## 2014-07-22 DIAGNOSIS — I5032 Chronic diastolic (congestive) heart failure: Secondary | ICD-10-CM | POA: Diagnosis present

## 2014-07-22 DIAGNOSIS — Z7901 Long term (current) use of anticoagulants: Secondary | ICD-10-CM | POA: Diagnosis not present

## 2014-07-22 DIAGNOSIS — M1711 Unilateral primary osteoarthritis, right knee: Secondary | ICD-10-CM | POA: Diagnosis present

## 2014-07-22 DIAGNOSIS — Z87891 Personal history of nicotine dependence: Secondary | ICD-10-CM

## 2014-07-22 DIAGNOSIS — I25119 Atherosclerotic heart disease of native coronary artery with unspecified angina pectoris: Secondary | ICD-10-CM | POA: Diagnosis present

## 2014-07-22 DIAGNOSIS — E669 Obesity, unspecified: Secondary | ICD-10-CM | POA: Diagnosis present

## 2014-07-22 DIAGNOSIS — E119 Type 2 diabetes mellitus without complications: Secondary | ICD-10-CM | POA: Diagnosis present

## 2014-07-22 DIAGNOSIS — N289 Disorder of kidney and ureter, unspecified: Secondary | ICD-10-CM

## 2014-07-22 DIAGNOSIS — C649 Malignant neoplasm of unspecified kidney, except renal pelvis: Secondary | ICD-10-CM | POA: Diagnosis present

## 2014-07-22 DIAGNOSIS — M1710 Unilateral primary osteoarthritis, unspecified knee: Secondary | ICD-10-CM | POA: Diagnosis present

## 2014-07-22 DIAGNOSIS — M179 Osteoarthritis of knee, unspecified: Secondary | ICD-10-CM | POA: Diagnosis present

## 2014-07-22 HISTORY — PX: TOTAL KNEE ARTHROPLASTY: SHX125

## 2014-07-22 HISTORY — DX: Unilateral primary osteoarthritis, right knee: M17.11

## 2014-07-22 LAB — GLUCOSE, CAPILLARY
Glucose-Capillary: 92 mg/dL (ref 70–99)
Glucose-Capillary: 118 mg/dL — ABNORMAL HIGH (ref 70–99)
Glucose-Capillary: 250 mg/dL — ABNORMAL HIGH (ref 70–99)
Glucose-Capillary: 277 mg/dL — ABNORMAL HIGH (ref 70–99)

## 2014-07-22 IMAGING — CR DG CHEST 2V
2 series · 2 of 2 positions shown · non-contrast
Comparison: 08/20/2010

CLINICAL DATA: Unicompartmental degenerative disease of the left
knee.  Hypertension, coronary artery disease post myocardial
infarction 5884.  Shortness of breath.  Diabetes.

CHEST - 2 VIEW

[w chest pa]
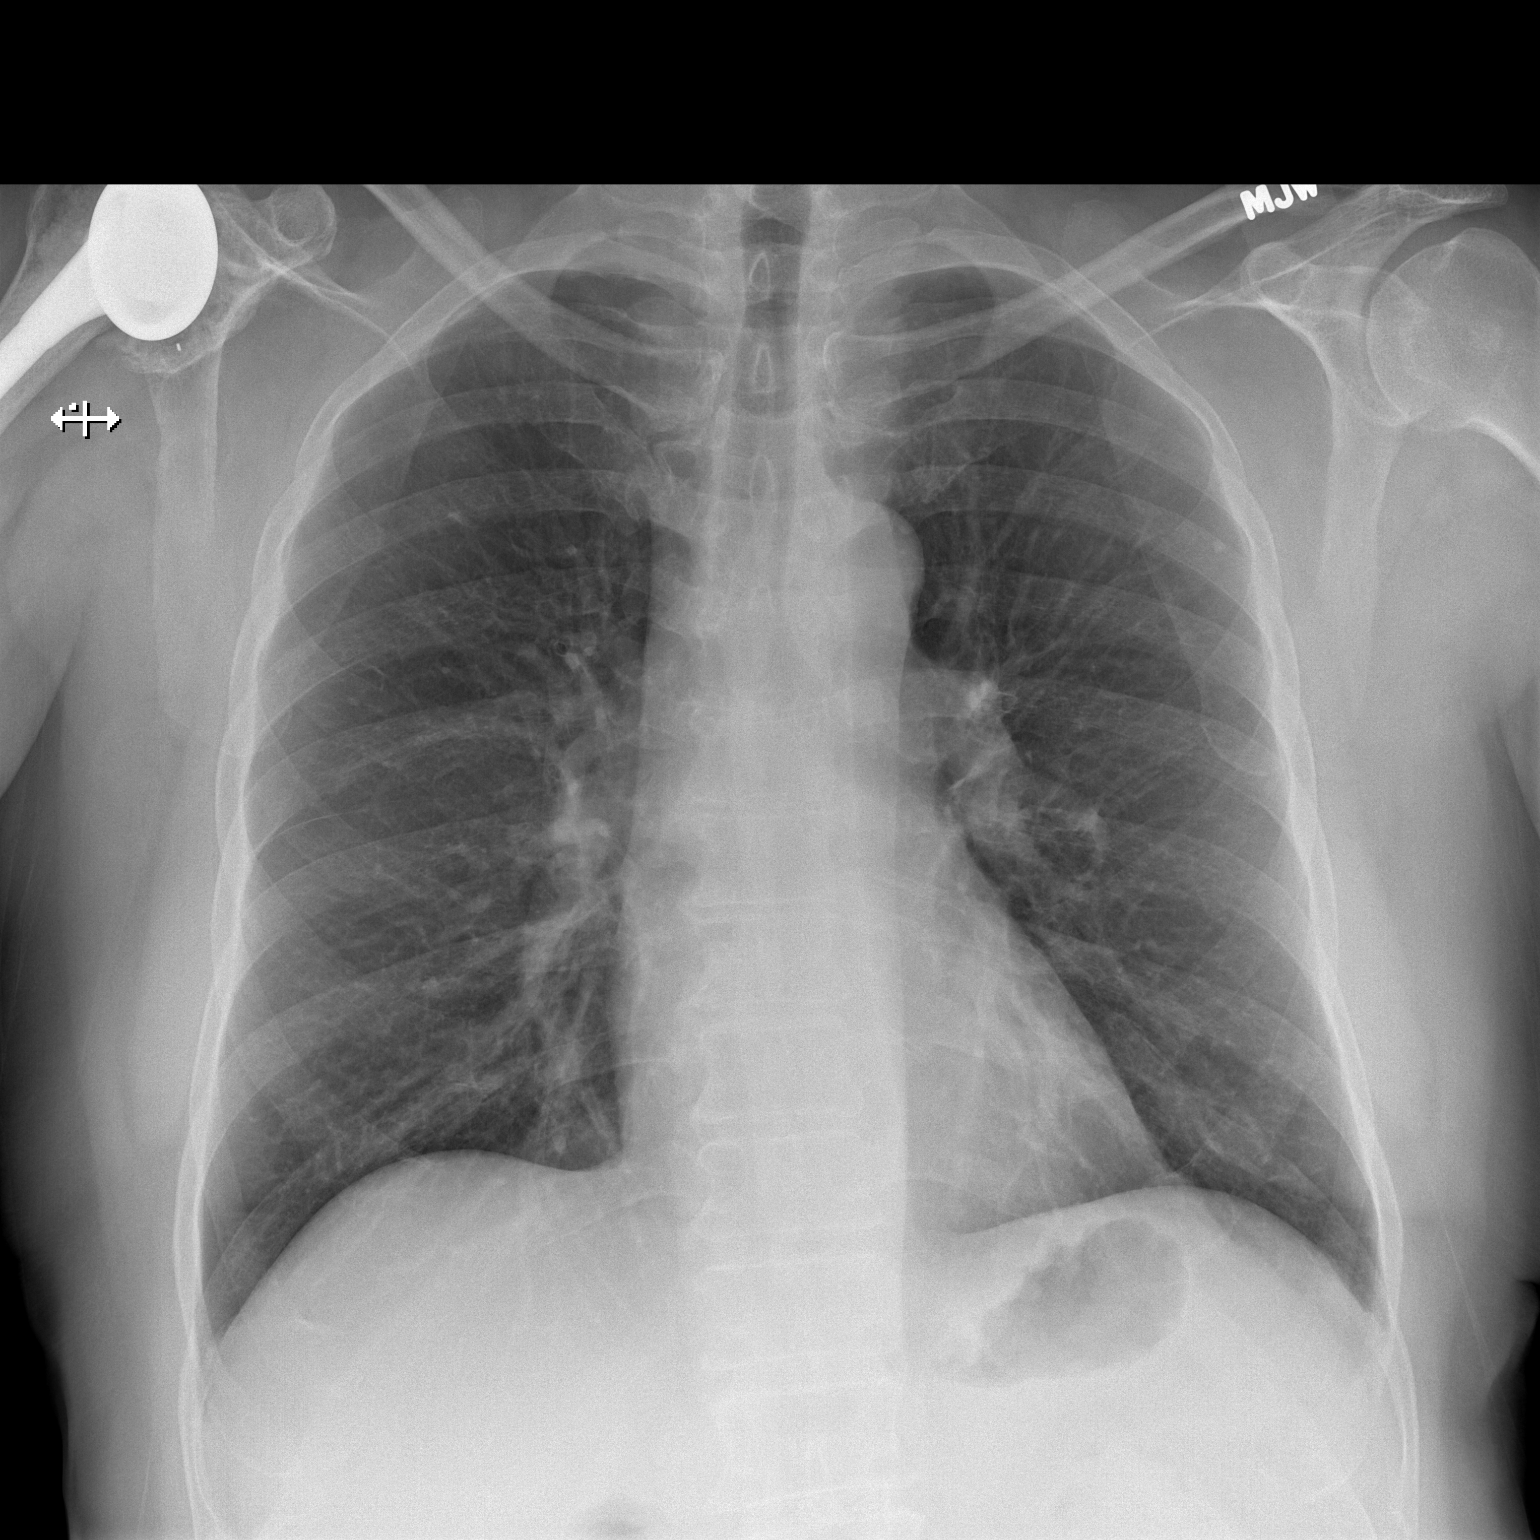

[w chest lat]
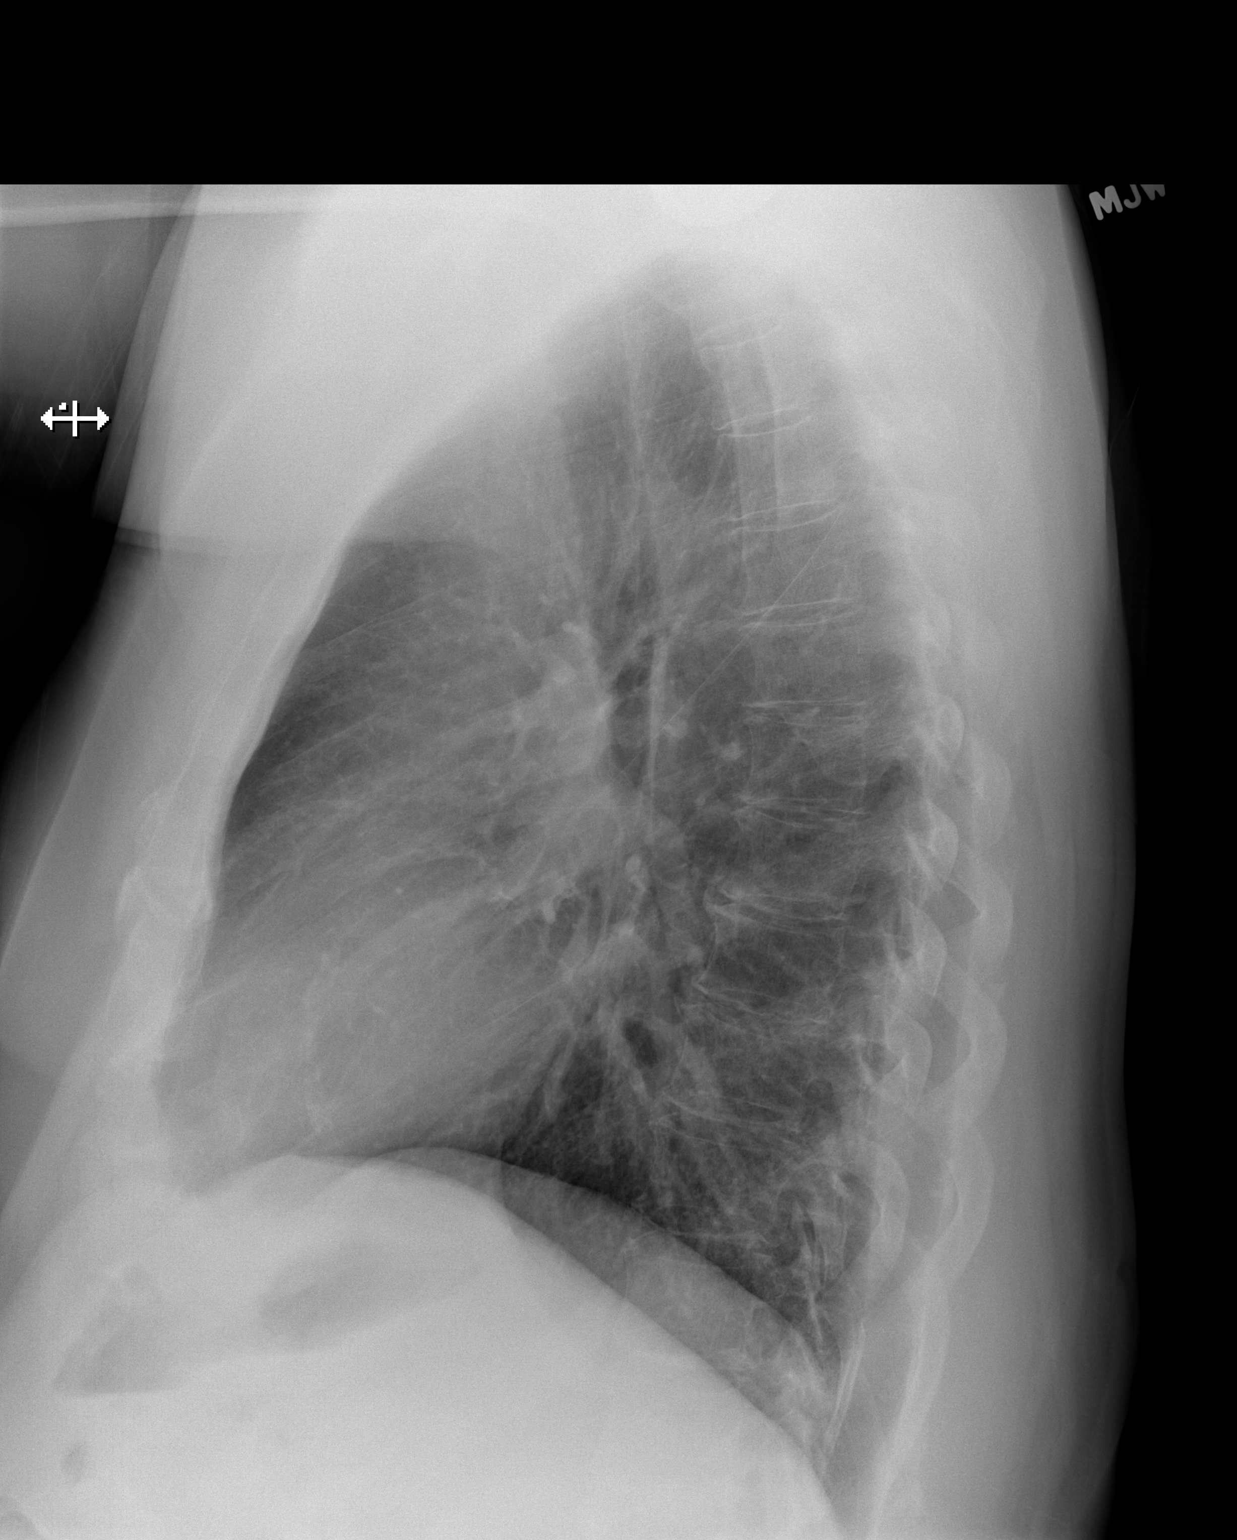

[2 of 2 positions shown; findings below may reference images not displayed]

FINDINGS: Heart and mediastinal contours are within normal limits.
The lung fields appear clear with no signs of focal infiltrate or
congestive failure.  No pleural fluid or significant peribronchial
cuffing is seen. Right pulmonary metastasis seen on the prior exam
are no longer apparent.

The patient has undergone right shoulder arthroplasty.
Degenerative changes of the lower cervical spine are noted.
IMPRESSION: No worrisome focal or acute cardiopulmonary abnormality identified.

## 2014-07-22 SURGERY — ARTHROPLASTY, KNEE, TOTAL
Anesthesia: General | Site: Knee | Laterality: Right

## 2014-07-22 MED ORDER — ROCURONIUM BROMIDE 50 MG/5ML IV SOLN
INTRAVENOUS | Status: AC
  Filled 2014-07-22: qty 1

## 2014-07-22 MED ORDER — ONDANSETRON HCL 4 MG/2ML IJ SOLN
INTRAMUSCULAR | Status: DC | PRN
Start: 2014-07-22 — End: 2014-07-22
  Administered 2014-07-22: 4 mg via INTRAVENOUS

## 2014-07-22 MED ORDER — ACETAMINOPHEN 325 MG PO TABS: 650.0000 mg | ORAL_TABLET | Freq: Four times a day (QID) | ORAL | Status: DC | PRN

## 2014-07-22 MED ORDER — PROPOFOL 10 MG/ML IV BOLUS
INTRAVENOUS | Status: DC | PRN
  Administered 2014-07-22: 160 mg via INTRAVENOUS

## 2014-07-22 MED ORDER — CHLORHEXIDINE GLUCONATE 4 % EX LIQD: 60.0000 mL | Freq: Once | CUTANEOUS | Status: DC

## 2014-07-22 MED ORDER — NITROGLYCERIN 0.4 MG SL SUBL: 0.4000 mg | SUBLINGUAL_TABLET | SUBLINGUAL | Status: DC | PRN

## 2014-07-22 MED ORDER — BUPIVACAINE-EPINEPHRINE (PF) 0.5% -1:200000 IJ SOLN
INTRAMUSCULAR | Status: DC | PRN
  Administered 2014-07-22: 30 mL via PERINEURAL

## 2014-07-22 MED ORDER — DEXTROSE 5 % IV SOLN
10.0000 mg | INTRAVENOUS | Status: DC | PRN
  Administered 2014-07-22: 25 ug/min via INTRAVENOUS

## 2014-07-22 MED ORDER — MIDAZOLAM HCL 2 MG/2ML IJ SOLN
INTRAMUSCULAR | Status: AC
  Administered 2014-07-22: 1 mg via INTRAVENOUS
  Filled 2014-07-22: qty 2

## 2014-07-22 MED ORDER — PROMETHAZINE HCL 25 MG/ML IJ SOLN: 6.2500 mg | INTRAMUSCULAR | Status: DC | PRN

## 2014-07-22 MED ORDER — MIDAZOLAM HCL 5 MG/5ML IJ SOLN
INTRAMUSCULAR | Status: DC | PRN
  Administered 2014-07-22 (×2): 1 mg via INTRAVENOUS

## 2014-07-22 MED ORDER — SODIUM CHLORIDE 0.9 % IR SOLN
Status: DC | PRN
  Administered 2014-07-22: 1000 mL

## 2014-07-22 MED ORDER — HYDROMORPHONE HCL 2 MG PO TABS
ORAL_TABLET | ORAL | Status: AC
  Filled 2014-07-22: qty 2

## 2014-07-22 MED ORDER — ADULT MULTIVITAMIN W/MINERALS CH
1.0000 | ORAL_TABLET | Freq: Every day | ORAL | Status: DC
  Administered 2014-07-22 – 2014-07-24 (×3): 1 via ORAL
  Filled 2014-07-22 (×3): qty 1

## 2014-07-22 MED ORDER — VITAMIN D3 25 MCG (1000 UNIT) PO TABS
1000.0000 [IU] | ORAL_TABLET | Freq: Every day | ORAL | Status: DC
  Administered 2014-07-22 – 2014-07-24 (×3): 1000 [IU] via ORAL
  Filled 2014-07-22 (×6): qty 1

## 2014-07-22 MED ORDER — METOCLOPRAMIDE HCL 5 MG PO TABS
5.0000 mg | ORAL_TABLET | Freq: Three times a day (TID) | ORAL | Status: DC | PRN
  Filled 2014-07-22: qty 2

## 2014-07-22 MED ORDER — SUCCINYLCHOLINE CHLORIDE 20 MG/ML IJ SOLN
INTRAMUSCULAR | Status: DC | PRN
  Administered 2014-07-22: 120 mg via INTRAVENOUS

## 2014-07-22 MED ORDER — BUPIVACAINE-EPINEPHRINE (PF) 0.25% -1:200000 IJ SOLN
INTRAMUSCULAR | Status: AC
  Filled 2014-07-22: qty 30

## 2014-07-22 MED ORDER — ONDANSETRON HCL 4 MG PO TABS: 4.0000 mg | ORAL_TABLET | Freq: Four times a day (QID) | ORAL | Status: DC | PRN

## 2014-07-22 MED ORDER — MIDAZOLAM HCL 2 MG/2ML IJ SOLN
INTRAMUSCULAR | Status: AC
  Filled 2014-07-22: qty 2

## 2014-07-22 MED ORDER — FENTANYL CITRATE 0.05 MG/ML IJ SOLN
INTRAMUSCULAR | Status: AC
  Filled 2014-07-22: qty 5

## 2014-07-22 MED ORDER — MORPHINE SULFATE 2 MG/ML IJ SOLN
2.0000 mg | INTRAMUSCULAR | Status: DC | PRN
  Administered 2014-07-22 – 2014-07-23 (×2): 2 mg via INTRAVENOUS
  Filled 2014-07-22 (×2): qty 1

## 2014-07-22 MED ORDER — RIVAROXABAN 10 MG PO TABS
10.0000 mg | ORAL_TABLET | Freq: Every day | ORAL | Status: DC
Start: 2014-07-23 — End: 2014-07-24
  Administered 2014-07-23 – 2014-07-24 (×2): 10 mg via ORAL
  Filled 2014-07-22 (×3): qty 1

## 2014-07-22 MED ORDER — CELECOXIB 200 MG PO CAPS
200.0000 mg | ORAL_CAPSULE | Freq: Two times a day (BID) | ORAL | Status: DC
  Administered 2014-07-22 – 2014-07-23 (×3): 200 mg via ORAL
  Filled 2014-07-22 (×3): qty 1

## 2014-07-22 MED ORDER — METOCLOPRAMIDE HCL 5 MG/ML IJ SOLN: 5.0000 mg | Freq: Three times a day (TID) | INTRAMUSCULAR | Status: DC | PRN

## 2014-07-22 MED ORDER — LACTATED RINGERS IV SOLN
INTRAVENOUS | Status: DC
  Administered 2014-07-22: 09:00:00 via INTRAVENOUS

## 2014-07-22 MED ORDER — PROMETHAZINE HCL 25 MG/ML IJ SOLN
INTRAMUSCULAR | Status: AC
  Filled 2014-07-22: qty 1

## 2014-07-22 MED ORDER — LACTATED RINGERS IV SOLN
INTRAVENOUS | Status: DC | PRN
  Administered 2014-07-22 (×2): via INTRAVENOUS

## 2014-07-22 MED ORDER — FENTANYL CITRATE 0.05 MG/ML IJ SOLN
INTRAMUSCULAR | Status: AC
  Filled 2014-07-22: qty 2

## 2014-07-22 MED ORDER — PHENYLEPHRINE 40 MCG/ML (10ML) SYRINGE FOR IV PUSH (FOR BLOOD PRESSURE SUPPORT)
PREFILLED_SYRINGE | INTRAVENOUS | Status: AC
  Filled 2014-07-22: qty 10

## 2014-07-22 MED ORDER — BUPIVACAINE-EPINEPHRINE 0.25% -1:200000 IJ SOLN
INTRAMUSCULAR | Status: DC | PRN
  Administered 2014-07-22: 30 mL

## 2014-07-22 MED ORDER — EPHEDRINE SULFATE 50 MG/ML IJ SOLN
INTRAMUSCULAR | Status: AC
  Filled 2014-07-22: qty 1

## 2014-07-22 MED ORDER — PHENOL 1.4 % MT LIQD: 1.0000 | OROMUCOSAL | Status: DC | PRN

## 2014-07-22 MED ORDER — DEXAMETHASONE SODIUM PHOSPHATE 10 MG/ML IJ SOLN
INTRAMUSCULAR | Status: AC
  Filled 2014-07-22: qty 1

## 2014-07-22 MED ORDER — PROPOFOL 10 MG/ML IV BOLUS
INTRAVENOUS | Status: AC
  Filled 2014-07-22: qty 20

## 2014-07-22 MED ORDER — DIPHENHYDRAMINE HCL 50 MG/ML IJ SOLN
INTRAMUSCULAR | Status: AC
  Filled 2014-07-22: qty 1

## 2014-07-22 MED ORDER — SUCCINYLCHOLINE CHLORIDE 20 MG/ML IJ SOLN
INTRAMUSCULAR | Status: AC
  Filled 2014-07-22: qty 1

## 2014-07-22 MED ORDER — HYDROMORPHONE HCL 1 MG/ML IJ SOLN
INTRAMUSCULAR | Status: AC
  Filled 2014-07-22: qty 1

## 2014-07-22 MED ORDER — PROMETHAZINE HCL 25 MG/ML IJ SOLN
6.2500 mg | INTRAMUSCULAR | Status: DC | PRN
  Administered 2014-07-22: 12.5 mg via INTRAVENOUS

## 2014-07-22 MED ORDER — POLYETHYLENE GLYCOL 3350 17 G PO PACK
17.0000 g | PACK | Freq: Two times a day (BID) | ORAL | Status: DC
  Administered 2014-07-22 – 2014-07-24 (×4): 17 g via ORAL
  Filled 2014-07-22 (×4): qty 1

## 2014-07-22 MED ORDER — DIPHENHYDRAMINE HCL 50 MG/ML IJ SOLN
12.5000 mg | Freq: Once | INTRAMUSCULAR | Status: AC
  Administered 2014-07-22: 12.5 mg via INTRAVENOUS

## 2014-07-22 MED ORDER — ATORVASTATIN CALCIUM 10 MG PO TABS
10.0000 mg | ORAL_TABLET | Freq: Every day | ORAL | Status: DC
  Administered 2014-07-23 – 2014-07-24 (×2): 10 mg via ORAL
  Filled 2014-07-22 (×2): qty 1

## 2014-07-22 MED ORDER — POLYETHYL GLYCOL-PROPYL GLYCOL 0.4-0.3 % OP SOLN: 1.0000 [drp] | Freq: Every day | OPHTHALMIC | Status: DC | PRN

## 2014-07-22 MED ORDER — VITAMIN B-12 100 MCG PO TABS
100.0000 ug | ORAL_TABLET | Freq: Every day | ORAL | Status: DC
Start: 2014-07-22 — End: 2014-07-24
  Administered 2014-07-22 – 2014-07-24 (×3): 100 ug via ORAL
  Filled 2014-07-22 (×4): qty 1

## 2014-07-22 MED ORDER — EPINEPHRINE 0.3 MG/0.3ML IJ SOAJ: 0.3000 mg | Freq: Once | INTRAMUSCULAR | Status: DC

## 2014-07-22 MED ORDER — POTASSIUM CHLORIDE IN NACL 20-0.9 MEQ/L-% IV SOLN
INTRAVENOUS | Status: DC
  Administered 2014-07-22 – 2014-07-23 (×2): via INTRAVENOUS
  Filled 2014-07-22 (×8): qty 1000

## 2014-07-22 MED ORDER — MEPERIDINE HCL 25 MG/ML IJ SOLN: 6.2500 mg | INTRAMUSCULAR | Status: DC | PRN

## 2014-07-22 MED ORDER — FENTANYL CITRATE 0.05 MG/ML IJ SOLN
25.0000 ug | INTRAMUSCULAR | Status: DC | PRN
  Administered 2014-07-22 (×2): 50 ug via INTRAVENOUS

## 2014-07-22 MED ORDER — DEXAMETHASONE SODIUM PHOSPHATE 10 MG/ML IJ SOLN
INTRAMUSCULAR | Status: DC | PRN
  Administered 2014-07-22: 10 mg via INTRAVENOUS

## 2014-07-22 MED ORDER — MENTHOL 3 MG MT LOZG: 1.0000 | LOZENGE | OROMUCOSAL | Status: DC | PRN

## 2014-07-22 MED ORDER — METOPROLOL TARTRATE 25 MG PO TABS
25.0000 mg | ORAL_TABLET | Freq: Two times a day (BID) | ORAL | Status: DC
  Administered 2014-07-22 – 2014-07-24 (×4): 25 mg via ORAL
  Filled 2014-07-22 (×4): qty 1

## 2014-07-22 MED ORDER — ONDANSETRON HCL 4 MG/2ML IJ SOLN: 4.0000 mg | Freq: Four times a day (QID) | INTRAMUSCULAR | Status: DC | PRN

## 2014-07-22 MED ORDER — MAGNESIUM GLUCONATE 500 MG PO TABS
500.0000 mg | ORAL_TABLET | Freq: Two times a day (BID) | ORAL | Status: DC
  Administered 2014-07-22 – 2014-07-24 (×4): 500 mg via ORAL
  Filled 2014-07-22 (×7): qty 1

## 2014-07-22 MED ORDER — FENTANYL CITRATE 0.05 MG/ML IJ SOLN: 25.0000 ug | INTRAMUSCULAR | Status: DC | PRN

## 2014-07-22 MED ORDER — HYDROMORPHONE HCL 2 MG PO TABS
2.0000 mg | ORAL_TABLET | ORAL | Status: DC | PRN
  Administered 2014-07-22: 2 mg via ORAL
  Administered 2014-07-22: 4 mg via ORAL
  Administered 2014-07-23: 2 mg via ORAL
  Administered 2014-07-23 (×5): 4 mg via ORAL
  Administered 2014-07-24 (×2): 2 mg via ORAL
  Administered 2014-07-24 (×2): 4 mg via ORAL
  Administered 2014-07-24: 2 mg via ORAL
  Filled 2014-07-22: qty 1
  Filled 2014-07-22 (×4): qty 2
  Filled 2014-07-22: qty 1
  Filled 2014-07-22: qty 2
  Filled 2014-07-22 (×3): qty 1
  Filled 2014-07-22 (×2): qty 2

## 2014-07-22 MED ORDER — LIDOCAINE HCL (CARDIAC) 20 MG/ML IV SOLN
INTRAVENOUS | Status: AC
  Filled 2014-07-22: qty 5

## 2014-07-22 MED ORDER — POLYVINYL ALCOHOL 1.4 % OP SOLN
1.0000 [drp] | OPHTHALMIC | Status: DC | PRN
Start: 2014-07-22 — End: 2014-07-24
  Filled 2014-07-22: qty 15

## 2014-07-22 MED ORDER — ACETAMINOPHEN 650 MG RE SUPP: 650.0000 mg | Freq: Four times a day (QID) | RECTAL | Status: DC | PRN

## 2014-07-22 MED ORDER — DOCUSATE SODIUM 100 MG PO CAPS
100.0000 mg | ORAL_CAPSULE | Freq: Two times a day (BID) | ORAL | Status: DC
  Administered 2014-07-22 – 2014-07-24 (×4): 100 mg via ORAL
  Filled 2014-07-22 (×4): qty 1

## 2014-07-22 MED ORDER — FAMOTIDINE 10 MG PO TABS
10.0000 mg | ORAL_TABLET | Freq: Every day | ORAL | Status: DC | PRN
  Filled 2014-07-22 (×2): qty 1

## 2014-07-22 MED ORDER — FENTANYL CITRATE 0.05 MG/ML IJ SOLN
INTRAMUSCULAR | Status: AC
  Administered 2014-07-22: 100 ug via INTRAVENOUS
  Filled 2014-07-22: qty 2

## 2014-07-22 MED ORDER — SODIUM CHLORIDE 0.9 % IJ SOLN
INTRAMUSCULAR | Status: AC
  Filled 2014-07-22: qty 10

## 2014-07-22 MED ORDER — DIPHENHYDRAMINE HCL 12.5 MG/5ML PO ELIX
12.5000 mg | ORAL_SOLUTION | ORAL | Status: DC | PRN
  Administered 2014-07-24: 25 mg via ORAL
  Filled 2014-07-22: qty 10

## 2014-07-22 MED ORDER — VANCOMYCIN HCL IN DEXTROSE 1-5 GM/200ML-% IV SOLN
INTRAVENOUS | Status: AC
  Administered 2014-07-22: 1000 mg via INTRAVENOUS
  Filled 2014-07-22: qty 200

## 2014-07-22 MED ORDER — HYDROMORPHONE HCL 1 MG/ML IJ SOLN
0.5000 mg | INTRAMUSCULAR | Status: AC | PRN
  Administered 2014-07-22 (×4): 0.5 mg via INTRAVENOUS

## 2014-07-22 MED ORDER — ALUM & MAG HYDROXIDE-SIMETH 200-200-20 MG/5ML PO SUSP
30.0000 mL | ORAL | Status: DC | PRN
  Administered 2014-07-23 (×2): 30 mL via ORAL
  Filled 2014-07-22 (×2): qty 30

## 2014-07-22 MED ORDER — VITAMIN C 500 MG PO TABS
500.0000 mg | ORAL_TABLET | Freq: Every day | ORAL | Status: DC
  Administered 2014-07-22 – 2014-07-24 (×3): 500 mg via ORAL
  Filled 2014-07-22 (×3): qty 1

## 2014-07-22 MED ORDER — DEXAMETHASONE SODIUM PHOSPHATE 10 MG/ML IJ SOLN
10.0000 mg | Freq: Three times a day (TID) | INTRAMUSCULAR | Status: DC
  Administered 2014-07-22 – 2014-07-24 (×6): 10 mg via INTRAVENOUS
  Filled 2014-07-22 (×6): qty 1

## 2014-07-22 MED ORDER — LORATADINE 10 MG PO TABS: 10.0000 mg | ORAL_TABLET | Freq: Every day | ORAL | Status: DC | PRN

## 2014-07-22 MED ORDER — POVIDONE-IODINE 7.5 % EX SOLN: Freq: Once | CUTANEOUS | Status: DC

## 2014-07-22 MED ORDER — ONDANSETRON HCL 4 MG/2ML IJ SOLN
INTRAMUSCULAR | Status: AC
  Filled 2014-07-22: qty 2

## 2014-07-22 MED ORDER — LIDOCAINE HCL (CARDIAC) 20 MG/ML IV SOLN
INTRAVENOUS | Status: DC | PRN
  Administered 2014-07-22: 100 mg via INTRAVENOUS

## 2014-07-22 MED ORDER — FENTANYL CITRATE 0.05 MG/ML IJ SOLN
INTRAMUSCULAR | Status: DC | PRN
  Administered 2014-07-22: 100 ug via INTRAVENOUS
  Administered 2014-07-22: 50 ug via INTRAVENOUS
  Administered 2014-07-22: 100 ug via INTRAVENOUS

## 2014-07-22 MED ORDER — VANCOMYCIN HCL IN DEXTROSE 1-5 GM/200ML-% IV SOLN
1000.0000 mg | Freq: Two times a day (BID) | INTRAVENOUS | Status: AC
  Administered 2014-07-22: 1000 mg via INTRAVENOUS
  Filled 2014-07-22: qty 200

## 2014-07-22 MED ORDER — MIDAZOLAM HCL 2 MG/2ML IJ SOLN: 1.0000 mg | Freq: Once | INTRAMUSCULAR | Status: DC

## 2014-07-22 MED ORDER — AZELASTINE HCL 0.1 % NA SOLN
1.0000 | Freq: Every morning | NASAL | Status: DC
  Administered 2014-07-23 – 2014-07-24 (×2): 1 via NASAL
  Filled 2014-07-22: qty 30

## 2014-07-22 SURGICAL SUPPLY — 74 items
BANDAGE ESMARK 6X9 LF (GAUZE/BANDAGES/DRESSINGS) ×1 IMPLANT
BENZOIN TINCTURE PRP APPL 2/3 (GAUZE/BANDAGES/DRESSINGS) ×2 IMPLANT
BLADE SAGITTAL 25.0X1.19X90 (BLADE) ×2 IMPLANT
BLADE SAW SGTL 11.0X1.19X90.0M (BLADE) IMPLANT
BLADE SAW SGTL 13.0X1.19X90.0M (BLADE) ×4 IMPLANT
BLADE SURG 10 STRL SS (BLADE) ×2 IMPLANT
BNDG ELASTIC 6X15 VLCR STRL LF (GAUZE/BANDAGES/DRESSINGS) ×2 IMPLANT
BNDG ESMARK 6X9 LF (GAUZE/BANDAGES/DRESSINGS) ×2
BOWL SMART MIX CTS (DISPOSABLE) ×2 IMPLANT
CAP KNEE TOTAL 3 SIGMA ×2 IMPLANT
CEMENT HV SMART SET (Cement) ×4 IMPLANT
COVER SURGICAL LIGHT HANDLE (MISCELLANEOUS) ×2 IMPLANT
CUFF TOURNIQUET SINGLE 34IN LL (TOURNIQUET CUFF) ×2 IMPLANT
CUFF TOURNIQUET SINGLE 44IN (TOURNIQUET CUFF) IMPLANT
DRAPE EXTREMITY T 121X128X90 (DRAPE) ×2 IMPLANT
DRAPE IMP U-DRAPE 54X76 (DRAPES) ×2 IMPLANT
DRAPE INCISE IOBAN 66X45 STRL (DRAPES) ×2 IMPLANT
DRAPE PROXIMA HALF (DRAPES) ×2 IMPLANT
DRAPE U-SHAPE 47X51 STRL (DRAPES) ×2 IMPLANT
DRSG AQUACEL AG ADV 3.5X14 (GAUZE/BANDAGES/DRESSINGS) ×2 IMPLANT
DRSG PAD ABDOMINAL 8X10 ST (GAUZE/BANDAGES/DRESSINGS) ×4 IMPLANT
DURAPREP 26ML APPLICATOR (WOUND CARE) ×4 IMPLANT
ELECT CAUTERY BLADE 6.4 (BLADE) ×2 IMPLANT
ELECT REM PT RETURN 9FT ADLT (ELECTROSURGICAL) ×2
ELECTRODE REM PT RTRN 9FT ADLT (ELECTROSURGICAL) ×1 IMPLANT
EVACUATOR 1/8 PVC DRAIN (DRAIN) ×2 IMPLANT
FACESHIELD WRAPAROUND (MASK) ×4 IMPLANT
GAUZE SPONGE 4X4 12PLY STRL (GAUZE/BANDAGES/DRESSINGS) IMPLANT
GLOVE BIO SURGEON STRL SZ7 (GLOVE) ×2 IMPLANT
GLOVE BIOGEL PI IND STRL 6.5 (GLOVE) ×1 IMPLANT
GLOVE BIOGEL PI IND STRL 7.0 (GLOVE) ×1 IMPLANT
GLOVE BIOGEL PI IND STRL 7.5 (GLOVE) ×2 IMPLANT
GLOVE BIOGEL PI INDICATOR 6.5 (GLOVE) ×1
GLOVE BIOGEL PI INDICATOR 7.0 (GLOVE) ×1
GLOVE BIOGEL PI INDICATOR 7.5 (GLOVE) ×2
GLOVE ECLIPSE 6.5 STRL STRAW (GLOVE) ×2 IMPLANT
GLOVE SS BIOGEL STRL SZ 7.5 (GLOVE) ×1 IMPLANT
GLOVE SUPERSENSE BIOGEL SZ 7.5 (GLOVE) ×1
GLOVE SURG SS PI 6.5 STRL IVOR (GLOVE) ×4 IMPLANT
GOWN STRL REUS W/ TWL LRG LVL3 (GOWN DISPOSABLE) ×4 IMPLANT
GOWN STRL REUS W/ TWL XL LVL3 (GOWN DISPOSABLE) ×2 IMPLANT
GOWN STRL REUS W/TWL LRG LVL3 (GOWN DISPOSABLE) ×4
GOWN STRL REUS W/TWL XL LVL3 (GOWN DISPOSABLE) ×2
HANDPIECE INTERPULSE COAX TIP (DISPOSABLE) ×1
HOOD PEEL AWAY FACE SHEILD DIS (HOOD) ×4 IMPLANT
IMMOBILIZER KNEE 22 (SOFTGOODS) ×2 IMPLANT
IMMOBILIZER KNEE 22 UNIV (SOFTGOODS) ×2 IMPLANT
KIT BASIN OR (CUSTOM PROCEDURE TRAY) ×2 IMPLANT
KIT ROOM TURNOVER OR (KITS) ×2 IMPLANT
MANIFOLD NEPTUNE II (INSTRUMENTS) ×2 IMPLANT
MARKER SKIN DUAL TIP RULER LAB (MISCELLANEOUS) ×2 IMPLANT
NS IRRIG 1000ML POUR BTL (IV SOLUTION) ×2 IMPLANT
PACK TOTAL JOINT (CUSTOM PROCEDURE TRAY) ×2 IMPLANT
PACK UNIVERSAL I (CUSTOM PROCEDURE TRAY) ×2 IMPLANT
PAD ABD 8X10 STRL (GAUZE/BANDAGES/DRESSINGS) ×2 IMPLANT
PAD ARMBOARD 7.5X6 YLW CONV (MISCELLANEOUS) ×4 IMPLANT
PADDING CAST COTTON 6X4 STRL (CAST SUPPLIES) ×2 IMPLANT
RUBBERBAND STERILE (MISCELLANEOUS) ×2 IMPLANT
SET HNDPC FAN SPRY TIP SCT (DISPOSABLE) ×1 IMPLANT
SPONGE GAUZE 4X4 12PLY STER LF (GAUZE/BANDAGES/DRESSINGS) ×2 IMPLANT
STRIP CLOSURE SKIN 1/2X4 (GAUZE/BANDAGES/DRESSINGS) ×2 IMPLANT
SUCTION FRAZIER TIP 10 FR DISP (SUCTIONS) ×2 IMPLANT
SUT ETHIBOND NAB CT1 #1 30IN (SUTURE) ×4 IMPLANT
SUT MNCRL AB 3-0 PS2 18 (SUTURE) ×2 IMPLANT
SUT VIC AB 0 CT1 27 (SUTURE) ×2
SUT VIC AB 0 CT1 27XBRD ANBCTR (SUTURE) ×2 IMPLANT
SUT VIC AB 2-0 CT1 27 (SUTURE) ×2
SUT VIC AB 2-0 CT1 TAPERPNT 27 (SUTURE) ×2 IMPLANT
SYR 30ML SLIP (SYRINGE) ×2 IMPLANT
SYR CONTROL 10ML LL (SYRINGE) ×2 IMPLANT
TOWEL OR 17X24 6PK STRL BLUE (TOWEL DISPOSABLE) ×2 IMPLANT
TOWEL OR 17X26 10 PK STRL BLUE (TOWEL DISPOSABLE) ×2 IMPLANT
TRAY FOLEY CATH 16FR SILVER (SET/KITS/TRAYS/PACK) ×2 IMPLANT
WATER STERILE IRR 1000ML POUR (IV SOLUTION) ×4 IMPLANT

## 2014-07-22 NOTE — Anesthesia Postprocedure Evaluation (Signed)
  Anesthesia Post-op Note  Patient: Donald Gray  Procedure(s) Performed: Procedure(s): RIGHT TOTAL KNEE ARTHROPLASTY (Right)  Patient Location: PACU  Anesthesia Type:General  Level of Consciousness: awake, alert  and oriented  Airway and Oxygen Therapy: Patient Spontanous Breathing and Patient connected to nasal cannula oxygen  Post-op Pain: moderate  Post-op Assessment: Post-op Vital signs reviewed, Patient's Cardiovascular Status Stable, Respiratory Function Stable, Patent Airway and No signs of Nausea or vomiting  Post-op Vital Signs: Reviewed and stable  Last Vitals:  Filed Vitals:   07/22/14 1114  BP: 122/64  Pulse: 81  Temp: 36.3 C  Resp: 11    Complications: No apparent anesthesia complications

## 2014-07-22 NOTE — Interval H&P Note (Signed)
History and Physical Interval Note:  07/22/2014 8:49 AM  Donald Gray  has presented today for surgery, with the diagnosis of primary localized OA right knee  The various methods of treatment have been discussed with the patient and family. After consideration of risks, benefits and other options for treatment, the patient has consented to  Procedure(s): RIGHT TOTAL KNEE ARTHROPLASTY (Right) as a surgical intervention .  The patient's history has been reviewed, patient examined, no change in status, stable for surgery.  I have reviewed the patient's chart and labs.  Questions were answered to the patient's satisfaction.     Elsie Saas A

## 2014-07-22 NOTE — Plan of Care (Signed)
Problem: Consults Goal: Diagnosis- Total Joint Replacement Primary Total Knee Right     

## 2014-07-22 NOTE — Transfer of Care (Signed)
Immediate Anesthesia Transfer of Care Note  Patient: Donald Gray  Procedure(s) Performed: Procedure(s): RIGHT TOTAL KNEE ARTHROPLASTY (Right)  Patient Location: PACU  Anesthesia Type:General and GA combined with regional for post-op pain  Level of Consciousness: awake, alert , oriented and patient cooperative  Airway & Oxygen Therapy: Patient Spontanous Breathing and Patient connected to nasal cannula oxygen  Post-op Assessment: Report given to RN, Post -op Vital signs reviewed and stable and Patient moving all extremities  Post vital signs: Reviewed and stable  Last Vitals:  Filed Vitals:   07/22/14 1114  BP:   Pulse:   Temp: 36.3 C  Resp:     Complications: No apparent anesthesia complications

## 2014-07-22 NOTE — Progress Notes (Signed)
Orthopedic Tech Progress Note Patient Details:  Donald Gray October 16, 1940 [id number] CPM applied to LLE with appropriate settings. OHF applied to bed. Footsie roll provided. CPM Right Knee CPM Right Knee: On Right Knee Flexion (Degrees): 90 Right Knee Extension (Degrees): 0   [name] 07/22/2014, 12:44 PM

## 2014-07-22 NOTE — Op Note (Signed)
MRN:     [id number] DOB/AGE:    [birth year] / 74 y.o.       OPERATIVE REPORT    DATE OF PROCEDURE:  07/22/2014       PREOPERATIVE DIAGNOSIS:   Primary localized OA right knee      Estimated body mass index is 29.72 kg/(m^2) as calculated from the following:   Height as of this encounter: 5\' 11"  (1.803 m).   Weight as of this encounter: 96.616 kg (213 lb).                                                        POSTOPERATIVE DIAGNOSIS:   same                                                                      PROCEDURE:  Procedure(s): RIGHT TOTAL KNEE ARTHROPLASTY Using Depuy Sigma RP implants #4 Femur, #4Tibia, 12.97mm sigma RP bearing, 35 Patella     SURGEON: Caryl Manas A    ASSISTANT:  Kirstin Shepperson PA-C   (Present and scrubbed throughout the case, critical for assistance with exposure, retraction, instrumentation, and closure.)         ANESTHESIA: GET with Femoral Nerve Block  DRAINS: foley, 2 medium hemovac in knee   TOURNIQUET TIME: 71IRC   COMPLICATIONS:  None     SPECIMENS: None   INDICATIONS FOR PROCEDURE: The patient has  djd right knee, varus deformities, XR shows bone on bone arthritis. Patient has failed all conservative measures including anti-inflammatory medicines, narcotics, attempts at  exercise and weight loss, cortisone injections and viscosupplementation.  Risks and benefits of surgery have been discussed, questions answered.   DESCRIPTION OF PROCEDURE: The patient identified by armband, received  right femoral nerve block and IV antibiotics, in the holding area at [facility]. Patient taken to the operating room, appropriate anesthetic  monitors were attached General endotracheal anesthesia induced with  the patient in supine position, Foley catheter was inserted. Tourniquet  applied high to the operative thigh. Lateral post and foot positioner  applied to the table, the lower extremity was then prepped and draped  in usual sterile fashion from  the ankle to the tourniquet. Time-out procedure was performed. The limb was wrapped with an Esmarch bandage and the tourniquet inflated to 365 mmHg. We began the operation by making the anterior midline incision starting at handbreadth above the patella going over the patella 1 cm medial to and  4 cm distal to the tibial tubercle. Small bleeders in the skin and the  subcutaneous tissue identified and cauterized. Transverse retinaculum was incised and reflected medially and a medial parapatellar arthrotomy was accomplished. the patella was everted and theprepatellar fat pad resected. The superficial medial collateral  ligament was then elevated from anterior to posterior along the proximal  flare of the tibia and anterior half of the menisci resected. The knee was hyperflexed exposing bone on bone arthritis. Peripheral and notch osteophytes as well as the cruciate ligaments were then resected. We continued to  work our way around posteriorly along the proximal tibia, and  externally  rotated the tibia subluxing it out from underneath the femur. A McHale  retractor was placed through the notch and a lateral Hohmann retractor  placed, and we then drilled through the proximal tibia in line with the  axis of the tibia followed by an intramedullary guide rod and 2-degree  posterior slope cutting guide. The tibial cutting guide was pinned into place  allowing resection of 4 mm of bone medially and about 6 mm of bone  laterally because of her varus deformity. Satisfied with the tibial resection, we then  entered the distal femur 2 mm anterior to the PCL origin with the  intramedullary guide rod and applied the distal femoral cutting guide  set at 59mm, with 5 degrees of valgus. This was pinned along the  epicondylar axis. At this point, the distal femoral cut was accomplished without difficulty. We then sized for a #4 femoral component and pinned the guide in 3 degrees of external rotation.The chamfer cutting  guide was pinned into place. The anterior, posterior, and chamfer cuts were accomplished without difficulty followed by  the Sigma RP box cutting guide and the box cut. We also removed posterior osteophytes from the posterior femoral condyles. At this  time, the knee was brought into full extension. We checked our  extension and flexion gaps and found them symmetric at 12.69mm.  The patella thickness measured at 25 mm. We set the cutting guide at 15 and removed the posterior 9.5-10 mm  of the patella sized for 35 button and drilled the lollipop. The knee  was then once again hyperflexed exposing the proximal tibia. We sized for a #4 tibial base plate, applied the smokestack and the conical reamer followed by the the Delta fin keel punch. We then hammered into place the Sigma RP trial femoral component, inserted a 12.5-mm trial bearing, trial patellar button, and took the knee through range of motion from 0-130 degrees. No thumb pressure was required for patellar  tracking. At this point, all trial components were removed, a double batch of DePuy HV cement  was mixed and applied to all bony metallic mating surfaces except for the posterior condyles of the femur itself. In order, we  hammered into place the tibial tray and removed excess cement, the femoral component and removed excess cement, a 12.5-mm Sigma RP bearing  was inserted, and the knee brought to full extension with compression.  The patellar button was clamped into place, and excess cement  removed. While the cement cured the wound was irrigated out with normal saline solution pulse lavage, and medium Hemovac drains were placed.. Ligament stability and patellar tracking were checked and found to be excellent. The tourniquet was then released and hemostasis was obtained with cautery. The parapatellar arthrotomy was closed with  #1 ethibond suture. The subcutaneous tissue with 0 and 2-0 undyed  Vicryl suture, and 4-0 Monocryl.. A dressing of  Xeroform,  4 x 4, dressing sponges, Webril, and Ace wrap applied. Needle and sponge count were correct times 2.The patient awakened, extubated, and taken to recovery room without difficulty. Vascular status was normal, pulses 2+ and symmetric.   Mete Purdum A 07/22/2014, 10:39 AM

## 2014-07-22 NOTE — Anesthesia Procedure Notes (Addendum)
Anesthesia Regional Block:  Adductor canal block  Pre-Anesthetic Checklist: ,, timeout performed, Correct Patient, Correct Site, Correct Laterality, Correct Procedure, Correct Position, site marked, Risks and benefits discussed,  Surgical consent,  Pre-op evaluation,  At surgeon's request and post-op pain management  Laterality: Right and Lower  Prep: Maximum Sterile Barrier Precautions used and chloraprep       Needles:  Injection technique: Single-shot  Needle Type: Echogenic Stimulator Needle     Needle Length: 10cm 10 cm Needle Gauge: 21 and 21 G    Additional Needles:  Procedures: ultrasound guided (picture in chart) Adductor canal block Narrative:  Start time: 07/22/2014 8:45 AM End time: 07/22/2014 8:55 AM Injection made incrementally with aspirations every 5 mL. Anesthesiologist: Alexis Frock  Additional Notes: R AC Block with 105ml .5% marcaine with epi, multiple asp, talked with patient throughout, no complications   Procedure Name: Intubation Date/Time: 07/22/2014 9:08 AM Performed by: Greggory Stallion, Zhara Gieske L Pre-anesthesia Checklist: Patient identified, Emergency Drugs available, Suction available, Timeout performed and Patient being monitored Patient Re-evaluated:Patient Re-evaluated prior to inductionOxygen Delivery Method: Circle system utilized Preoxygenation: Pre-oxygenation with 100% oxygen Intubation Type: IV induction and Cricoid Pressure applied Ventilation: Mask ventilation without difficulty Laryngoscope Size: Mac and 4 Grade View: Grade III Tube type: Oral Tube size: 7.5 mm Number of attempts: 1 Airway Equipment and Method: Stylet Placement Confirmation: ETT inserted through vocal cords under direct vision,  breath sounds checked- equal and bilateral and positive ETCO2 Secured at: 21 cm Tube secured with: Tape Dental Injury: Teeth and Oropharynx as per pre-operative assessment

## 2014-07-22 NOTE — Progress Notes (Signed)
Utilization review completed.  

## 2014-07-23 ENCOUNTER — Encounter (HOSPITAL_COMMUNITY): Payer: Self-pay | Admitting: Orthopedic Surgery

## 2014-07-23 DIAGNOSIS — I9581 Postprocedural hypotension: Secondary | ICD-10-CM | POA: Diagnosis not present

## 2014-07-23 DIAGNOSIS — I9589 Other hypotension: Secondary | ICD-10-CM | POA: Diagnosis not present

## 2014-07-23 LAB — BASIC METABOLIC PANEL
Anion gap: 6 (ref 5–15)
BUN: 25 mg/dL — AB (ref 6–23)
CHLORIDE: 106 mmol/L (ref 96–112)
CO2: 25 mmol/L (ref 19–32)
Calcium: 8.3 mg/dL — ABNORMAL LOW (ref 8.4–10.5)
Creatinine, Ser: 1.37 mg/dL — ABNORMAL HIGH (ref 0.50–1.35)
GFR calc non Af Amer: 50 mL/min — ABNORMAL LOW (ref >90)
GFR, EST AFRICAN AMERICAN: 57 mL/min — AB (ref >90)
GLUCOSE: 146 mg/dL — AB (ref 70–99)
POTASSIUM: 5 mmol/L (ref 3.5–5.1)
Sodium: 137 mmol/L (ref 135–145)

## 2014-07-23 LAB — CBC
HCT: 32.6 % — ABNORMAL LOW (ref 39.0–52.0)
Hemoglobin: 11 g/dL — ABNORMAL LOW (ref 13.0–17.0)
MCH: 30.1 pg (ref 26.0–34.0)
MCHC: 33.7 g/dL (ref 30.0–36.0)
MCV: 89.3 fL (ref 78.0–100.0)
Platelets: 132 10*3/uL — ABNORMAL LOW (ref 150–400)
RBC: 3.65 MIL/uL — ABNORMAL LOW (ref 4.22–5.81)
RDW: 13.8 % (ref 11.5–15.5)
WBC: 10.5 10*3/uL (ref 4.0–10.5)

## 2014-07-23 LAB — GLUCOSE, CAPILLARY
GLUCOSE-CAPILLARY: 173 mg/dL — AB (ref 70–99)
GLUCOSE-CAPILLARY: 188 mg/dL — AB (ref 70–99)
GLUCOSE-CAPILLARY: 202 mg/dL — AB (ref 70–99)
Glucose-Capillary: 203 mg/dL — ABNORMAL HIGH (ref 70–99)

## 2014-07-23 MED ORDER — SODIUM CHLORIDE 0.9 % IV BOLUS (SEPSIS)
500.0000 mL | Freq: Once | INTRAVENOUS | Status: AC
  Administered 2014-07-23: 500 mL via INTRAVENOUS

## 2014-07-23 MED ORDER — FAMOTIDINE 20 MG PO TABS
10.0000 mg | ORAL_TABLET | Freq: Two times a day (BID) | ORAL | Status: DC
  Administered 2014-07-23 – 2014-07-24 (×2): 10 mg via ORAL
  Filled 2014-07-23: qty 1

## 2014-07-23 NOTE — Clinical Social Work Psychosocial (Signed)
Clinical Social Work Department BRIEF PSYCHOSOCIAL ASSESSMENT 07/23/2014  Patient:  Donald Gray, Donald Gray     Account Number:  [id number]     Admit date:  07/22/2014  Clinical Social Worker:  Durward Fortes, CLINICAL SOCIAL WORKER  Date/Time:  07/23/2014 03:03 PM  Referred by:  Physician  Date Referred:  07/23/2014 Referred for  SNF Placement   Other Referral:   none.   Interview type:  Patient Other interview type:   none.    PSYCHOSOCIAL DATA Living Status:  WIFE Admitted from facility:   Level of care:   Primary support name:  Cayleb Jarnigan Primary support relationship to patient:  SPOUSE Degree of support available:   Adequate support.    CURRENT CONCERNS Current Concerns  Post-Acute Placement   Other Concerns:   none.    SOCIAL WORK ASSESSMENT / PLAN CSW and BSW-Intern consulted regarding possible SNF placement for pt once medically stable for discharge.    BSW-Intern met with pt at bedside after receiving report that pt wanted to further therapy at [facility]. Pt informed BSW-Intern that pt has completed paperwork at [facility] last week, however pt and pt's wife are still waiting on approval from insurance  to go to [place].    Pt is very involved in pt's care and is anxious to return back home to be with family and pt's wife.     CSW and BSW-Intern to continue to assist with discharge planning needs.   Assessment/plan status:  Psychosocial Support/Ongoing Assessment of Needs Other assessment/ plan:   none.   Information/referral to community resources:   Pt to be discharged to [facility] once medically stable for discharge.    PATIENT'S/FAMILY'S RESPONSE TO PLAN OF CARE: Pt and pt's family agreeable and understanding of CSW plan of care. Pt and pt's family expressed no further questions or concerns at this time.       Virgie Dad Brisha Mccabe, BSW-Intern

## 2014-07-23 NOTE — Clinical Social Work Placement (Signed)
Clinical Social Work Department CLINICAL SOCIAL WORK PLACEMENT NOTE 07/23/2014  Patient:  Donald Gray, Donald Gray  Account Number:  [id number] [place] date:  07/22/2014  Clinical Social Worker:  Durward Fortes, CLINICAL SOCIAL WORKER  Date/time:  07/23/2014 03:12 PM  Clinical Social Work is seeking post-discharge placement for this patient at the following level of care:   SKILLED NURSING   (*CSW will update this form in Epic as items are completed)   07/23/2014  Patient/family provided with [place] Department of Clinical Social Work's list of facilities offering this level of care within the geographic area requested by the patient (or if unable, by the patient's family).  07/23/2014  Patient/family informed of their freedom to choose among providers that offer the needed level of care, that participate in Medicare, Medicaid or managed care program needed by the patient, have an available bed and are willing to accept the patient.  07/23/2014  Patient/family informed of [facility]' ownership interest in [facility], as well as of the fact that they are under no obligation to receive care at this facility.  PASARR submitted to EDS on 07/23/2014 PASARR number received on 07/23/2014  FL2 transmitted to all facilities in geographic area requested by pt/family on  07/23/2014 FL2 transmitted to all facilities within larger geographic area on   Patient informed that his/her managed care company has contracts with or will negotiate with  certain facilities, including the following:     Patient/family informed of bed offers received:  07/23/2014 Patient chooses bed at [facility] Physician recommends and patient chooses bed at    Patient to be transferred to  [facility] on 07/24/2014  Patient to be transferred to facility by [facility] Patient and family notified of transfer on  07/24/2014 Name of family member notified:  Patient and patient's wife updated.  The following physician  request were entered in Epic:   Additional Comments:   Kierra S. [address], BSW-Intern  Lubertha [place], [place] ([phone]) Licensed Clinical Social Worker Orthopedics [phone]) and Surgical [phone])

## 2014-07-23 NOTE — Discharge Instructions (Addendum)
Information on my medicine - XARELTO (Rivaroxaban)  This medication education was reviewed with me or my healthcare representative as part of my discharge preparation.   Why was Xarelto prescribed for you? Xarelto was prescribed for you to reduce the risk of blood clots forming after orthopedic surgery and for atrial fibrillation to prevent strokes. The medical term for these abnormal blood clots is venous thromboembolism (VTE).  What do you need to know about xarelto ? Take your Xarelto ONCE DAILY at the same time every day. You may take it either with or without food.  If you have difficulty swallowing the tablet whole, you may crush it and mix in applesauce just prior to taking your dose.  Take Xarelto exactly as prescribed by your doctor and DO NOT stop taking Xarelto without talking to the doctor who prescribed the medication.  Stopping without other VTE prevention medication to take the place of Xarelto may increase your risk of developing a clot.  After discharge, you should have regular check-up appointments with your healthcare provider that is prescribing your Xarelto.    What do you do if you miss a dose? If you miss a dose, take it as soon as you remember on the same day then continue your regularly scheduled once daily regimen the next day. Do not take two doses of Xarelto on the same day.   Important Safety Information A possible side effect of Xarelto is bleeding. You should call your healthcare provider right away if you experience any of the following: ? Bleeding from an injury or your nose that does not stop. ? Unusual colored urine (red or dark brown) or unusual colored stools (red or black). ? Unusual bruising for unknown reasons. ? A serious fall or if you hit your head (even if there is no bleeding).  Some medicines may interact with Xarelto and might increase your risk of bleeding while on Xarelto. To help avoid this, consult your healthcare provider or  pharmacist prior to using any new prescription or non-prescription medications, including herbals, vitamins, non-steroidal anti-inflammatory drugs (NSAIDs) and supplements.  This website has more information on Xarelto: [email or web address]-benson.com/.   INSTRUCTIONS AFTER JOINT REPLACEMENT   Remove items at home which could result in a fall. This includes throw rugs or furniture in walking pathways ICE to the affected joint every three hours while awake for 30 minutes at a time, for at least the first 3-5 days, and then as needed for pain and swelling.  Continue to use ice for pain and swelling. You may notice swelling that will progress down to the foot and ankle.  This is normal after surgery.  Elevate your leg when you are not up walking on it.   Continue to use the breathing machine you got in the hospital (incentive spirometer) which will help keep your temperature down.  It is common for your temperature to cycle up and down following surgery, especially at night when you are not up moving around and exerting yourself.  The breathing machine keeps your lungs expanded and your temperature down.   DIET:  As you were doing prior to hospitalization, we recommend a well-balanced diet.  DRESSING / WOUND CARE / SHOWERING  Keep the surgical dressing until follow up.  The dressing is water proof, so you can shower without any extra covering.  IF THE DRESSING FALLS OFF or the wound gets wet inside, change the dressing with sterile gauze.  Please use good hand washing techniques before changing the dressing.  Do not use any lotions or creams on the incision until instructed by your surgeon.    ACTIVITY  Increase activity slowly as tolerated, but follow the weight bearing instructions below.   No driving for 4 weeks or until further direction given by your physician.  You cannot drive while taking narcotics.  No lifting or carrying greater than 10 lbs. until further directed by your surgeon. Avoid periods of  inactivity such as sitting longer than an hour when not asleep. This helps prevent blood clots.  You may return to work once you are authorized by your doctor.     WEIGHT BEARING   Weight bearing as tolerated with assist device (walker, cane, etc) as directed, use it as long as suggested by your surgeon or therapist, typically at least 4-6 weeks.   EXERCISES  Results after joint replacement surgery are often greatly improved when you follow the exercise, range of motion and muscle strengthening exercises prescribed by your doctor. Safety measures are also important to protect the joint from further injury. Any time any of these exercises cause you to have increased pain or swelling, decrease what you are doing until you are comfortable again and then slowly increase them. If you have problems or questions, call your caregiver or physical therapist for advice.   Rehabilitation is important following a joint replacement. After just a few days of immobilization, the muscles of the leg can become weakened and shrink (atrophy).  These exercises are designed to build up the tone and strength of the thigh and leg muscles and to improve motion. Often times heat used for twenty to thirty minutes before working out will loosen up your tissues and help with improving the range of motion but do not use heat for the first two weeks following surgery (sometimes heat can increase post-operative swelling).   These exercises can be done on a training (exercise) mat, on the floor, on a table or on a bed. Use whatever works the best and is most comfortable for you.    Use music or television while you are exercising so that the exercises are a pleasant break in your day. This will make your life better with the exercises acting as a break in your routine that you can look forward to.   Perform all exercises about fifteen times, three times per day or as directed.  You should exercise both the operative leg and the  other leg as well.    Exercises include:   Quad Sets - Tighten up the muscle on the front of the thigh (Quad) and hold for 5-10 seconds.   Straight Leg Raises - With your knee straight, lift the leg to 60 degrees, hold for 3 seconds, and slowly lower the leg.  Perform this exercise against resistance later as your leg gets stronger.  Leg Slides: Lying on your back, slowly slide your foot toward your buttocks, bending your knee up off the floor. Then slowly slide your foot back down until your leg is flat on the floor again.  Angel Wings: Lying on your back spread your legs to the side as far apart as you can without causing discomfort.  Hamstring Strength:  Lying on your back, push your heel against the floor with your leg straight by tightening up the muscles of your buttocks.  Repeat, but this time bend your knee to a comfortable angle, and push your heel against the floor.  You may put a pillow under the heel to make it  more comfortable if necessary.   A rehabilitation program following joint replacement surgery can speed recovery and prevent re-injury in the future due to weakened muscles. Contact your doctor or a physical therapist for more information on knee rehabilitation.    CONSTIPATION  Constipation is defined medically as fewer than three stools per week and severe constipation as less than one stool per week.  Even if you have a regular bowel pattern at home, your normal regimen is likely to be disrupted due to multiple reasons following surgery.  Combination of anesthesia, postoperative narcotics, change in appetite and fluid intake all can affect your bowels.   YOU MUST use at least one of the following options; they are listed in order of increasing strength to get the job done.  They are all available over the counter, and you may need to use some, POSSIBLY even all of these options:    Drink plenty of fluids (prune juice may be helpful) and high fiber foods Colace 100 mg by  mouth twice a day  Senokot for constipation as directed and as needed Dulcolax (bisacodyl), take with full glass of water  Miralax (polyethylene glycol) once or twice a day as needed.  If you have tried all these things and are unable to have a bowel movement in the first 3-4 days after surgery try Magnesium Citrate.  1/2 bottle in the morning.  If no results the other half of the bottle the next morning.  If still no results, call either your surgeon or your primary doctor.    If you experience loose stools or diarrhea, hold the medications until you stool forms back up.  If your symptoms do not get better within 1 week or if they get worse, check with your doctor.  If you experience "the worst abdominal pain ever" or develop nausea or vomiting, please contact the office immediately for further recommendations for treatment.

## 2014-07-23 NOTE — Progress Notes (Signed)
Orthopedic Tech Progress Note Patient Details:  Donald Gray Jul 10, 1940 [id number] Patient in [place] (placed by therapy)    Donald Gray 07/23/2014, 3:07 PM

## 2014-07-23 NOTE — Progress Notes (Signed)
Subjective: 1 Day Post-Op Procedure(s) (LRB): RIGHT TOTAL KNEE ARTHROPLASTY (Right) Patient reports pain as 3 on 0-10 scale.    Objective: Vital signs in last 24 hours: Temp:  [97.4 F (36.3 C)-98.2 F (36.8 C)] 98 F (36.7 C) (03/22 0516) Pulse Rate:  [61-98] 61 (03/22 0516) Resp:  [10-45] 18 (03/22 0400) BP: (91-168)/(43-110) 91/43 mmHg (03/22 0516) SpO2:  [90 %-100 %] 94 % (03/22 0516)  Intake/Output from previous day: 03/21 0701 - 03/22 0700 In: 2830 [P.O.:480; I.V.:1800; IV Piggyback:200] Out: 2450 [Urine:2175; Drains:200; Blood:75] Intake/Output this shift: Total I/O In: 1065 [I.V.:1065] Out: -    Recent Labs  07/23/14 0529  HGB 11.0*    Recent Labs  07/23/14 0529  WBC 10.5  RBC 3.65*  HCT 32.6*  PLT 132*    Recent Labs  07/23/14 0529  NA 137  K 5.0  CL 106  CO2 25  BUN 25*  CREATININE 1.37*  GLUCOSE 146*  CALCIUM 8.3*   No results for input(s): LABPT, INR in the last 72 hours.  ABD soft Neurovascular intact Sensation intact distally Intact pulses distally Dorsiflexion/Plantar flexion intact Incision: dressing C/D/I  Assessment/Plan: 1 Day Post-Op Procedure(s) (LRB): RIGHT TOTAL KNEE ARTHROPLASTY (Right)  Principal Problem:   Primary localized osteoarthritis of right knee Active Problems:   Obesity   Renal disorder   Diabetes, diet controlled   H/O interleukin-2 treatment   GERD (gastroesophageal reflux disease)   Heart attack   Coronary artery disease, hx stent 1995   Renal cell carcinoma   PAF (paroxysmal atrial fibrillation), 10/08/13 TEE and DCCV to SR   Atrial fibrillation   Acute on chronic diastolic heart failure   DJD (degenerative joint disease) of knee   Postoperative hypotension  Advance diet Up with therapy Discharge to SNF  Fluid bolus times 2 today to treat postoperative hypotension in the setting of renal insufficiency.  Consult Social Work.    Kyndall Chaplin J 07/23/2014, 8:50 AM

## 2014-07-23 NOTE — Progress Notes (Signed)
Physical Therapy Treatment Patient Details Name: Donald Gray MRN: [id number] DOB: 11/02/1940 Today's Date: 07/23/2014    History of Present Illness 74 y.o. male admitted to [facility] on 07/22/14 for R TKA.  Pt with significant PMHx of HTN, CAD, SOB, anemia, PAF, MI, DM2, neuropathy bil feet, renal cell carcinoma s/p nephrectomy, and L TKA 11/14/12.    PT Comments    Pt is POD #1 and this is his second session.  He is more limited in his mobility this PM due to increased right knee pain.  He reports that he thinks the nerve block is finally wearing off.  He was pre medicated for our session.  Wife was present and reiterating that she cannot physically assist with the pt at home despite her RN background due to her own health issues and this is why they are pursuing SNF level rehab at discharge.  Pt needs to be at a supervision to mod I level to go home safely with her.  PT will continue to follow acutely to progress pt's mobility.   Follow Up Recommendations  SNF ([facility])     Equipment Recommendations  None recommended by PT    Recommendations for Other Services   NA     Precautions / Restrictions Precautions Precautions: Knee Precaution Booklet Issued: Yes (comment) Precaution Comments: reviewed no pillow under operative knee and the rest of his knee exercise handout.  Restrictions RLE Weight Bearing: Weight bearing as tolerated    Mobility  Bed Mobility Overal bed mobility: Needs Assistance Bed Mobility: Supine to Sit;Sit to Supine     Supine to sit: Supervision;HOB elevated Sit to supine: Supervision;HOB elevated   General bed mobility comments: supervision for safety and due to heavy reliance on upper extremity support and use of bed rail and trapeeze bar to manage his trunk to get to sitting EOB.   Transfers Overall transfer level: Needs assistance Equipment used: Rolling walker (2 wheeled) Transfers: Sit to/from Stand Sit to Stand: Min guard          General transfer comment: Min guard assist to stabilize RW, multiple attempts needed from low bed due to increased knee pain this PM.  Pt lightheaded upon standing and encouraged to stand for 30 or so seconds before proceeding with gait.  He had to do the same thing in sitting.  Sitting BP WNL.    Ambulation/Gait Ambulation/Gait assistance: Min guard Ambulation Distance (Feet): 120 Feet Assistive device: Rolling walker (2 wheeled) Gait Pattern/deviations: Step-through pattern;Antalgic Gait velocity: decreased compared to AM session   General Gait Details: Pt with moderately antalgic gait pattern this PM due to increased pain in right knee.  Pt self correcting flexed posture.  Min guard assist for safety due to slower speed and increased antalgic pattern.           Balance Overall balance assessment: Needs assistance Sitting-balance support: Feet supported Sitting balance-Leahy Scale: Good     Standing balance support: Bilateral upper extremity supported;No upper extremity supported;Single extremity supported Standing balance-Leahy Scale: Fair                      Cognition Arousal/Alertness: Awake/alert Behavior During Therapy: WFL for tasks assessed/performed Overall Cognitive Status: Within Functional Limits for tasks assessed                      Exercises Total Joint Exercises Short Arc Quad: AROM;Right;10 reps;Supine Heel Slides: AAROM;Right;10 reps;Supine Hip ABduction/ADduction: AROM;Right;10 reps;Supine Straight Leg Raises:  AROM;Right;10 reps;Supine Long Arc Quad: AROM;Right;10 reps;Seated Knee Flexion: AAROM;Right;10 reps;Seated    General Comments General comments (skin integrity, edema, etc.): Wife in room this session and reports she has her own health issues that prevent her from physically helping him (she mentioned a colostomy bag).  She cannot lift the CPM to help him get into that machine per MD's pot-op protocol and she cannot help lift  the patient at all.       Pertinent Vitals/Pain Pain Assessment: 0-10 Pain Score: 7  Pain Location: right knee Pain Descriptors / Indicators: Aching;Burning Pain Intervention(s): Limited activity within patient's tolerance;Monitored during session;Repositioned     PT Goals (current goals can now be found in the care plan section) Acute Rehab PT Goals Patient Stated Goal: to go to [facility] for rehab before retunring home.  Progress towards PT goals: Not progressing toward goals - comment (nerve block wearing off, increased pain this PM)    Frequency  7X/week    PT Plan Current plan remains appropriate       End of Session   Activity Tolerance: Patient limited by fatigue;Patient limited by pain Patient left: in bed;with call bell/phone within reach;in CPM;with family/visitor present     Time: 5797-2820 PT Time Calculation (min) (ACUTE ONLY): 40 min  Charges:  $Gait Training: 8-22 mins $Therapeutic Exercise: 23-37 mins                      Dezi Schaner B. [place], [place], DPT [phone]   07/23/2014, 3:25 PM

## 2014-07-23 NOTE — Care Management Note (Signed)
CARE MANAGEMENT NOTE 07/23/2014  Patient:  CEDERICK, BROADNAX   Account Number:  [id number]  Date Initiated:  07/23/2014  Documentation initiated by:  Ricki Miller  Subjective/Objective Assessment:   74 yr old male admitted with right knee DJD. Patient underwent a right total knee arthroplasty.     Action/Plan:   Patient will need shortterm rehab at [facility]. Social worker is awre. Patient wants to go to [facility].   Anticipated DC Date:  07/24/2014   Anticipated DC Plan:  SKILLED NURSING FACILITY  In-house referral  Clinical Social Worker      DC Planning Services  CM consult      [facility] Choice  NA   Choice offered to / List presented to:     DME arranged  [facility]      DME agency  [employer]        Status of service:  Completed, signed off Medicare Important Message given?  NA - LOS <3 / Initial given by admissions (If response is "NO", the following Medicare IM given date fields will be blank) Date Medicare IM given:   Medicare IM given by:   Date Additional Medicare IM given:   Additional Medicare IM given by:    Discharge Disposition:  [place]  Per UR Regulation:  Reviewed for med. necessity/level of care/duration of stay

## 2014-07-23 NOTE — Evaluation (Signed)
Physical Therapy Evaluation Patient Details Name: Donald Gray MRN: [id number] DOB: [birth year] Today's Date: 07/23/2014   History of Present Illness  74 y.o. male admitted to [facility] on 07/22/14 for R TKA.  Pt with significant PMHx of HTN, CAD, SOB, anemia, PAF, MI, DM2, neuropathy bil feet, renal cell carcinoma s/p nephrectomy, and L TKA 11/14/12.  Clinical Impression  Pt is POD #1 and is progressing well with his mobility and exercises.  He has a difficult to access home and lives with an elderly wife so family is pursuing SNF level rehab at discharge as they report struggling after his last TKA on the left.   PT to follow acutely for deficits listed below.       Follow Up Recommendations SNF (has pre arranged for [facility] at d/c)    Equipment Recommendations  None recommended by PT    Recommendations for Other Services   NA     Precautions / Restrictions Precautions Precautions: Knee Precaution Booklet Issued: Yes (comment) Precaution Comments: knee handout given and reviewed Restrictions Weight Bearing Restrictions: Yes RLE Weight Bearing: Weight bearing as tolerated      Mobility  Bed Mobility               General bed mobility comments: Pt seated in recliner chair.   Transfers Overall transfer level: Needs assistance Equipment used: Rolling walker (2 wheeled) Transfers: Sit to/from Stand Sit to Stand: Min guard         General transfer comment: Min guard assist for safety to stabilize RW during transitions.   Ambulation/Gait Ambulation/Gait assistance: Supervision Ambulation Distance (Feet): 200 Feet Assistive device: Rolling walker (2 wheeled) Gait Pattern/deviations: Step-through pattern;Antalgic     General Gait Details: Mildly antalgic gait pattern, stiff knee with bil foot drag due to DF weakness.  Pt has h/o CA tx and DM with bil foot peripheral neruopathy and DF weakness as a result.  He does report he often catches his toes on carpet at  home causing him to lurch forward and stumble.   Stairs Stairs: Yes Stairs assistance: Supervision Stair Management: Two rails;Step to pattern;Forwards Number of Stairs: 5 General stair comments: Pt able to verbalize and demonstrate correct LE sequencing on the stairs, however, he is relying on both handrails and only has one for both home entry and for his flight of stairs to get to his bedroom at home.          Balance Overall balance assessment: Needs assistance Sitting-balance support: Feet supported;No upper extremity supported Sitting balance-Leahy Scale: Good     Standing balance support: Bilateral upper extremity supported;No upper extremity supported;Single extremity supported Standing balance-Leahy Scale: Fair                               Pertinent Vitals/Pain Pain Assessment: 0-10 Pain Score: 2  Pain Location: right knee Pain Descriptors / Indicators: Aching;Burning Pain Intervention(s): Limited activity within patient's tolerance;Monitored during session;Premedicated before session;Repositioned    Home Living Family/patient expects to be discharged to:: Skilled nursing facility ([facility]) Living Arrangements: Spouse/significant other Available Help at Discharge: Family;Available 24 hours/day (wife is retured Therapist, sports) Type of Home: House Home Access: Stairs to enter Entrance Stairs-Rails: Right Entrance Stairs-Number of Steps: 13 Home Layout: Two level Home Equipment: Star - 2 wheels;Bedside commode;Shower seat;Grab bars - tub/shower;Hand held shower head (walk in shower)      Prior Function Level of Independence: Independent  Hand Dominance   Dominant Hand: Right    Extremity/Trunk Assessment   Upper Extremity Assessment: Overall WFL for tasks assessed           Lower Extremity Assessment: RLE deficits/detail RLE Deficits / Details: right leg with normal post op pain and weakness 3+/5, knee 3-/5, hip flex 3-/5     Cervical / Trunk Assessment: Normal  Communication   Communication: HOH  Cognition Arousal/Alertness: Awake/alert Behavior During Therapy: WFL for tasks assessed/performed Overall Cognitive Status: Within Functional Limits for tasks assessed                         Exercises Total Joint Exercises Ankle Circles/Pumps: AROM;Both;20 reps;Supine Quad Sets: AROM;Right;10 reps;Supine Towel Squeeze: AROM;Both;10 reps;Supine Heel Slides: AROM;Right;10 reps;Supine Knee Flexion: AROM;Right;10 reps;Seated Goniometric ROM: 5-110 flexion in sitting AROM      Assessment/Plan    PT Assessment Patient needs continued PT services  PT Diagnosis Difficulty walking;Abnormality of gait;Generalized weakness;Acute pain   PT Problem List Decreased strength;Decreased range of motion;Decreased activity tolerance;Decreased balance;Decreased mobility;Decreased knowledge of use of DME;Impaired sensation;Pain  PT Treatment Interventions DME instruction;Gait training;Stair training;Functional mobility training;Therapeutic activities;Therapeutic exercise;Balance training;Neuromuscular re-education;Patient/family education;Manual techniques;Modalities   PT Goals (Current goals can be found in the Care Plan section) Acute Rehab PT Goals Patient Stated Goal: to go to [facility] for rehab before retunring home.  PT Goal Formulation: With patient Time For Goal Achievement: 07/30/14 Potential to Achieve Goals: Good    Frequency 7X/week   Barriers to discharge Inaccessible home environment;Decreased caregiver support Pt has 13 STE and one flight of stairs to get to his bedroom.  He has support from a 9 y.o. wife which limits her ability to help him at discharge.        End of Session   Activity Tolerance: Patient tolerated treatment well Patient left: in chair;with call bell/phone within reach Nurse Communication: Mobility status         Time: 2863-8177 PT Time Calculation (min) (ACUTE  ONLY): 42 min   Charges:   PT Evaluation $Initial PT Evaluation Tier I: 1 Procedure PT Treatments $Gait Training: 8-22 mins $Therapeutic Exercise: 8-22 mins        Zanasia Hickson B. [place], [place], DPT [phone]   07/23/2014, 11:11 AM

## 2014-07-23 NOTE — Evaluation (Addendum)
Occupational Therapy Evaluation Patient Details Name: Donald Gray MRN: [id number] DOB: [birth year] Today's Date: 07/23/2014    History of Present Illness 74 y.o. male admitted to [facility] on 07/22/14 for R TKA.  Pt with significant PMHx of HTN, CAD, SOB, anemia, PAF, MI, DM2, neuropathy bil feet, renal cell carcinoma s/p nephrectomy, and L TKA 11/14/12.   Clinical Impression   Pt s/p above. Pt's wife with medical issues and pt planning to d/c to [facility] Deferring all further OT needs to SNF.     Follow Up Recommendations  SNF    Equipment Recommendations  Other (comment) (defer to next venue)    Recommendations for Other Services       Precautions / Restrictions Precautions Precautions: Knee Precaution Booklet Issued: No Precaution Comments: educated on knee precautions Restrictions Weight Bearing Restrictions: Yes RLE Weight Bearing: Weight bearing as tolerated      Mobility Bed Mobility Overal bed mobility: Needs Assistance Bed Mobility: Sit to Supine;Rolling;Sidelying to Sit Rolling: Modified independent (Device/Increase time) Sidelying to sit: Supervision (due to dizziness) Sit to supine: Modified independent (Device/Increase time)   General bed mobility comments: used trapeze to scoot HOB.  Pt dizzy when coming to sit EOB.  Transfers Overall transfer level: Needs assistance Equipment used: Rolling walker (2 wheeled) Transfers: Sit to/from Stand Sit to Stand: Supervision         General transfer comment: cues for hand placement.    Balance Supervision for ambulation with RW.                           ADL Overall ADL's : Needs assistance/impaired     Grooming: Sitting;Set up   Upper Body Bathing: Set up;Sitting   Lower Body Bathing: Minimal assistance;Sit to/from stand   Upper Body Dressing : Set up;Sitting   Lower Body Dressing: Moderate assistance;Sit to/from stand   Toilet Transfer: Supervision/safety;Ambulation;RW (bed)    Toileting- Clothing Manipulation and Hygiene: Supervision/safety;Sit to/from stand       Functional mobility during ADLs: Supervision/safety;Rolling walker General ADL Comments: Educated on LB dressing technique. Educated on AE. Educated on safety such as sitting for LB ADLs and use of bag on walker. Explained benefit of reaching down to Rt foot for ADLs as it allows knee to bend.     Vision     Perception     Praxis      Pertinent Vitals/Pain Pain Assessment: 0-10 Pain Score: 5  Pain Location: right knee Pain Descriptors / Indicators: Aching Pain Intervention(s): Monitored during session;Patient requesting pain meds-RN notified     Hand Dominance Right   Extremity/Trunk Assessment Upper Extremity Assessment Upper Extremity Assessment: Overall WFL for tasks assessed   Lower Extremity Assessment Lower Extremity Assessment: Defer to PT evaluation       Communication Communication Communication: No difficulties   Cognition Arousal/Alertness: Awake/alert Behavior During Therapy: WFL for tasks assessed/performed Overall Cognitive Status: Within Functional Limits for tasks assessed                     General Comments          Shoulder Instructions      Home Living Family/patient expects to be discharged to:: Skilled nursing facility ([place]) Living Arrangements: Spouse/significant other Available Help at Discharge: Family;Available 24 hours/day (wife is retired Therapist, sports) Type of Home: House Home Access: Stairs to enter [employer] of Steps: 13 Entrance Stairs-Rails: Right Home Layout: Two level Alternate Level Stairs-Number of  Steps: flight Alternate Level Stairs-Rails: Right           Home Equipment: Walker - 2 wheels;Bedside commode;Shower seat;Grab bars - tub/shower;Hand held shower head;Adaptive equipment Adaptive Equipment: Reacher   Pt has walk-in shower (upstairs) and regular height as well as elevated toilet         Prior  Functioning/Environment Level of Independence: Needs assistance    ADL's / Homemaking Assistance Needed: assist at times for LB dressing        OT Diagnosis: Acute pain   OT Problem List: Decreased strength;Decreased range of motion;Decreased activity tolerance;Decreased knowledge of precautions;Decreased knowledge of use of DME or AE;Pain   OT Treatment/Interventions:      OT Goals(Current goals can be found in the care plan section) Acute Rehab OT Goals Patient Stated Goal: go to rehab  OT Frequency:     Barriers to D/C:            Co-evaluation              End of Session Equipment Utilized During Treatment: Gait belt;Rolling walker CPM Right Knee CPM Right Knee: Off Nurse Communication: Patient requests pain meds;Other (comment) (tech to get pt in CPM)  Activity Tolerance: Patient limited by pain Patient left: in bed;with call bell/phone within reach;with family/visitor present   Time: 6213-0865 OT Time Calculation (min): 16 min Charges:  OT General Charges $OT Visit: 1 Procedure OT Evaluation $Initial OT Evaluation Tier I: 1 Procedure G-CodesBenito Mccreedy OTR/L [phone] 07/23/2014, 3:59 PM

## 2014-07-24 LAB — BASIC METABOLIC PANEL
Anion gap: 4 — ABNORMAL LOW (ref 5–15)
BUN: 22 mg/dL (ref 6–23)
CO2: 22 mmol/L (ref 19–32)
Calcium: 8.3 mg/dL — ABNORMAL LOW (ref 8.4–10.5)
Chloride: 113 mmol/L — ABNORMAL HIGH (ref 96–112)
Creatinine, Ser: 1.18 mg/dL (ref 0.50–1.35)
GFR calc non Af Amer: 59 mL/min — ABNORMAL LOW (ref >90)
GFR, EST AFRICAN AMERICAN: 69 mL/min — AB (ref >90)
Glucose, Bld: 121 mg/dL — ABNORMAL HIGH (ref 70–99)
POTASSIUM: 4.7 mmol/L (ref 3.5–5.1)
SODIUM: 139 mmol/L (ref 135–145)

## 2014-07-24 LAB — CBC
HCT: 29.7 % — ABNORMAL LOW (ref 39.0–52.0)
Hemoglobin: 9.9 g/dL — ABNORMAL LOW (ref 13.0–17.0)
MCH: 29.9 pg (ref 26.0–34.0)
MCHC: 33.3 g/dL (ref 30.0–36.0)
MCV: 89.7 fL (ref 78.0–100.0)
Platelets: 137 10*3/uL — ABNORMAL LOW (ref 150–400)
RBC: 3.31 MIL/uL — ABNORMAL LOW (ref 4.22–5.81)
RDW: 14 % (ref 11.5–15.5)
WBC: 10 10*3/uL (ref 4.0–10.5)

## 2014-07-24 LAB — GLUCOSE, CAPILLARY
Glucose-Capillary: 142 mg/dL — ABNORMAL HIGH (ref 70–99)
Glucose-Capillary: 177 mg/dL — ABNORMAL HIGH (ref 70–99)

## 2014-07-24 MED ORDER — CEFAZOLIN SODIUM 1-5 GM-% IV SOLN
1.0000 g | Freq: Four times a day (QID) | INTRAVENOUS | Status: DC
  Filled 2014-07-24 (×2): qty 50

## 2014-07-24 MED ORDER — CELECOXIB 200 MG PO CAPS
200.0000 mg | ORAL_CAPSULE | Freq: Every day | ORAL | Status: DC
  Administered 2014-07-24: 200 mg via ORAL
  Filled 2014-07-24: qty 1

## 2014-07-24 MED ORDER — POLYETHYLENE GLYCOL 3350 17 G PO PACK: 17.0000 g | PACK | Freq: Two times a day (BID) | ORAL | Status: DC

## 2014-07-24 MED ORDER — HYDROMORPHONE HCL 2 MG PO TABS: ORAL_TABLET | ORAL | Status: DC

## 2014-07-24 MED ORDER — CELECOXIB 200 MG PO CAPS: 200.0000 mg | ORAL_CAPSULE | Freq: Every day | ORAL | Status: DC

## 2014-07-24 MED ORDER — CEPHALEXIN 500 MG PO CAPS: 500.0000 mg | ORAL_CAPSULE | Freq: Four times a day (QID) | ORAL | Status: DC

## 2014-07-24 MED ORDER — RIVAROXABAN 10 MG PO TABS
10.0000 mg | ORAL_TABLET | Freq: Every day | ORAL | Status: DC
Start: 2014-07-24 — End: 2014-12-05

## 2014-07-24 MED ORDER — DOCUSATE SODIUM 100 MG PO CAPS: 100.0000 mg | ORAL_CAPSULE | Freq: Two times a day (BID) | ORAL | Status: DC

## 2014-07-24 MED ORDER — CEFAZOLIN SODIUM-DEXTROSE 2-3 GM-% IV SOLR
2.0000 g | Freq: Four times a day (QID) | INTRAVENOUS | Status: DC
  Administered 2014-07-24 (×2): 2 g via INTRAVENOUS
  Filled 2014-07-24 (×6): qty 50

## 2014-07-24 NOTE — Progress Notes (Signed)
Rept given to Surveyor, quantity at [employer]. Packet given to pt and wife. Pt transferred to facility without incident.

## 2014-07-24 NOTE — Clinical Social Work Psychosocial (Deleted)
Clinical Social Work Department BRIEF PSYCHOSOCIAL ASSESSMENT 07/24/2014  Patient:  Donald Gray     Account Number:  [id number]     Admit date:  03/14/2007  Clinical Social Worker:  Delrae Sawyers  Date/Time:  07/24/2014 10:47 AM  Referred by:  Physician  Date Referred:  07/24/2014 Referred for  SNF Placement   Other Referral:   none.   Interview type:  Patient Other interview type:   none.    PSYCHOSOCIAL DATA Living Status:  WIFE Admitted from facility:   Level of care:   Primary support name:  Terrall Laity Primary support relationship to patient:  SPOUSE Degree of support available:   Strong support system.    CURRENT CONCERNS Current Concerns  Post-Acute Placement   Other Concerns:   none.    SOCIAL WORK ASSESSMENT / PLAN CSW received referral for possible SNF placement at time of discharge. CSW met with patient at bedside to discuss patient's discharge disposition. Patient informed CSW patient would prefer to be discharged to [facility] once medically stable for discharge. Patient hopeful to complete rehabilitation in a short amount of time and return home with patient's wife.    CSW to continue to follow and assist with discharge planning needs.   Assessment/plan status:  Psychosocial Support/Ongoing Assessment of Needs Other assessment/ plan:   none.   Information/referral to community resources:   [facility] bed offers.    PATIENT'S/FAMILY'S RESPONSE TO PLAN OF CARE: Patient understanding and agreeable to CSW plan of care. Patient expressed no further questions or concerns at this time.       Lubertha Sayres, [place] ([phone]) Licensed Clinical Social Worker Orthopedics [phone]) and Surgical [phone])

## 2014-07-24 NOTE — Progress Notes (Signed)
Physical Therapy Treatment Patient Details Name: Donald Gray MRN: [id number] DOB: Feb 09, 1941 Today's Date: 07/24/2014    History of Present Illness 74 y.o. male admitted to [facility] on 07/22/14 for R TKA.  Pt with significant PMHx of HTN, CAD, SOB, anemia, PAF, MI, DM2, neuropathy bil feet, renal cell carcinoma s/p nephrectomy, and L TKA 11/14/12.    PT Comments    Pt POD #2 and is more stiff and painful today.  Gait speed is slower.   He is at a higher fall risk this AM.  PT will continue to follow acutely and await post acute rehab.  Follow Up Recommendations  SNF ([facility])     Equipment Recommendations  None recommended by PT    Recommendations for Other Services   NA     Precautions / Restrictions Precautions Precautions: Knee Restrictions RLE Weight Bearing: Weight bearing as tolerated    Mobility  Bed Mobility Overal bed mobility: Modified Independent Bed Mobility: Supine to Sit;Sit to Supine     Supine to sit: Modified independent (Device/Increase time) Sit to supine: Modified independent (Device/Increase time)   General bed mobility comments: Pt uses bed rail and trapeeze bar, HOB flat this session.   Transfers Overall transfer level: Needs assistance Equipment used: Rolling walker (2 wheeled) Transfers: Sit to/from Stand Sit to Stand: Supervision         General transfer comment: Supervision for safety from lower surfaces and heavy reliance on hands for transitions.  Verbal cues for safe hand placement.   Ambulation/Gait Ambulation/Gait assistance: Min guard Ambulation Distance (Feet): 150 Feet Assistive device: Rolling walker (2 wheeled) Gait Pattern/deviations: Step-through pattern;Antalgic;Trunk flexed Gait velocity: decreased- 0.68 ft/sec Gait velocity interpretation: <1.8 ft/sec, indicative of risk for recurrent falls General Gait Details: Pt with more stiff legged gait pattern this AM and increased reports of pain.  Min guard assist for  safety due to risk of falls based on speed and abnormal pattern.  Pt reports lightheadedness during gait, but was able to continue.  BP stable.     Stairs Stairs: Yes Stairs assistance: Min guard Stair Management: One rail Right;Step to pattern;With crutches (crutch) Number of Stairs: 5 General stair comments: Min guard assist to stabilize pt for balance especially while going up the stairs.  Verbal cues for correct curutch sequencing.   Wheelchair Mobility    Modified Rankin (Stroke Patients Only)       Balance Overall balance assessment: Needs assistance Sitting-balance support: Feet supported;No upper extremity supported Sitting balance-Leahy Scale: Good     Standing balance support: Single extremity supported;Bilateral upper extremity supported;No upper extremity supported Standing balance-Leahy Scale: Fair                      Cognition Arousal/Alertness: Awake/alert Behavior During Therapy: WFL for tasks assessed/performed Overall Cognitive Status: Within Functional Limits for tasks assessed                      Exercises Total Joint Exercises Ankle Circles/Pumps: AROM;Both;20 reps;Supine Quad Sets: AROM;Right;10 reps;Supine Towel Squeeze: AROM;Both;10 reps;Supine Heel Slides: AROM;Right;10 reps;Supine Knee Flexion: AROM;Right;10 reps;Seated Goniometric ROM: 5-100 flexion supine AAROM    General Comments        Pertinent Vitals/Pain Pain Assessment: 0-10 Pain Score: 6  Pain Location: right knee Pain Descriptors / Indicators: Aching Pain Intervention(s): Limited activity within patient's tolerance;Monitored during session;Repositioned    Home Living  Prior Function            PT Goals (current goals can now be found in the care plan section) Acute Rehab PT Goals Patient Stated Goal: go to rehab Progress towards PT goals: Progressing toward goals    Frequency  7X/week    PT Plan Current plan remains  appropriate    Co-evaluation             End of Session   Activity Tolerance: Patient limited by pain;Patient limited by fatigue (limited by lightheadedness) Patient left: in bed;in CPM;with call bell/phone within reach     Time: 0814-0849 PT Time Calculation (min) (ACUTE ONLY): 35 min  Charges:  $Gait Training: 8-22 mins $Therapeutic Exercise: 8-22 mins            Shifra Swartzentruber B. Morton, [place], DPT [phone]    07/24/2014, 9:01 AM

## 2014-07-24 NOTE — Clinical Social Work Placement (Deleted)
Clinical Social Work Department CLINICAL SOCIAL WORK PLACEMENT NOTE 07/24/2014  Patient:  Donald Gray  Account Number:  [id number] Admit date:  03/14/2007  Clinical Social Worker:  Delrae Sawyers  Date/time:  07/24/2014 10:52 AM  Clinical Social Work is seeking post-discharge placement for this patient at the following level of care:   [place]   (*CSW will update this form in Epic as items are completed)   07/24/2014  Patient/family provided with [facility] Department of Clinical Social Work's list of facilities offering this level of care within the geographic area requested by the patient (or if unable, by the patient's family).  07/24/2014  Patient/family informed of their freedom to choose among providers that offer the needed level of care, that participate in Medicare, Medicaid or managed care program needed by the patient, have an available bed and are willing to accept the patient.  07/24/2014  Patient/family informed of [facility]' ownership interest in [facility], as well as of the fact that they are under no obligation to receive care at this facility.  PASARR submitted to EDS on 07/24/2014 PASARR number received on 07/24/2014  FL2 transmitted to all facilities in geographic area requested by pt/family on  07/24/2014 FL2 transmitted to all facilities within larger geographic area on   Patient informed that his/her managed care company has contracts with or will negotiate with  certain facilities, including the following:     Patient/family informed of bed offers received:   Patient chooses bed at  Physician recommends and patient chooses bed at    Patient to be transferred to  on   Patient to be transferred to facility by  Patient and family notified of transfer on  Name of family member notified:    The following physician request were entered in Epic:   Additional Comments:  Henderson [place] ([phone]) Licensed Clinical  Social Worker Orthopedics [phone]) and Surgical [phone])

## 2014-07-24 NOTE — Discharge Summary (Signed)
Patient ID: Donald Gray MRN: [id number] DOB/AGE: [birth year] 74 y.o.  Admit date: 07/22/2014 Discharge date: 07/24/2014  Admission Diagnoses:  Principal Problem:   Primary localized osteoarthritis of right knee Active Problems:   Obesity   Renal disorder   Diabetes, diet controlled   H/O interleukin-2 treatment   GERD (gastroesophageal reflux disease)   Heart attack   Coronary artery disease, hx stent 1995   Renal cell carcinoma   PAF (paroxysmal atrial fibrillation), 10/08/13 TEE and DCCV to SR   Atrial fibrillation   Acute on chronic diastolic heart failure   DJD (degenerative joint disease) of knee   Postoperative hypotension   Discharge Diagnoses:  Same  Past Medical History  Diagnosis Date  . Hypertension   . Coronary artery disease     a. s/p MI with stent in 2000  . Hyperlipidemia   . Shortness of breath     a. related to chemo drugs  . Anemia   . GERD (gastroesophageal reflux disease)   . Diverticulosis   . PAF (paroxysmal atrial fibrillation)   . Myocardial infarction 1997  . Type II diabetes mellitus     "treating w/diet and weight loss"  . History of blood transfusion     "w/nephrectomy; w/shoulder replacement; w/knee replacement"  . Arthritis     "fingers, toes" (10/05/2013)  . Left knee DJD   . Anticoagulated- to go to Xarelto for a fib 10/07/2013  . NSTEMI (non-ST elevated myocardial infarction), type 2 due to demand ischemia 10/07/2013  . Dysrhythmia   . Neuropathy     feet bilat   . Pneumonia ~ 2007    again 3 months ago   . Tendonitis     currently on prednisone and wearing brace (left wrist)  . Primary localized osteoarthritis of right knee   . Renal cell carcinoma     a. s/p nephrectomy    Surgeries: Procedure(s): RIGHT TOTAL KNEE ARTHROPLASTY on 07/22/2014   Consultants:    Discharged Condition: Improved  Hospital Course: Donald Gray is an 74 y.o. male who was admitted 07/22/2014 for operative treatment ofPrimary localized  osteoarthritis of right knee. Patient has severe unremitting pain that affects sleep, daily activities, and work/hobbies. After pre-op clearance the patient was taken to the operating room on 07/22/2014 and underwent  Procedure(s): RIGHT TOTAL KNEE ARTHROPLASTY.    Patient was given perioperative antibiotics: Anti-infectives    Start     Dose/Rate Route Frequency Ordered Stop   07/24/14 0830  ceFAZolin (ANCEF) IVPB 1 g/50 mL premix     1 g 100 mL/hr over 30 Minutes Intravenous 4 times per day 07/24/14 0731     07/24/14 0000  cephALEXin (KEFLEX) 500 MG capsule     500 mg Oral 4 times daily 07/24/14 0746     07/22/14 2000  vancomycin (VANCOCIN) IVPB 1000 mg/200 mL premix     1,000 mg 200 mL/hr over 60 Minutes Intravenous Every 12 hours 07/22/14 1711 07/22/14 2121   07/22/14 0812  vancomycin (VANCOCIN) 1 GM/200ML IVPB    Comments:  Ardine Eng   : cabinet override      07/22/14 0812 07/22/14 0910   07/21/14 1141  vancomycin (VANCOCIN) IVPB 1000 mg/200 mL premix  Status:  Discontinued     1,000 mg 200 mL/hr over 60 Minutes Intravenous On call to O.R. 07/21/14 1141 07/22/14 1711       Patient was given sequential compression devices, early ambulation, and chemoprophylaxis to prevent DVT.  Post op day 0  patient spent extra time in PACU due to bed availability.  Patients drain was accidentally pulled out.  Per patient.   the nursing staff reinserted the drain under unsterile conditions.  Dressing was changed on post op day two there was some early redness about the drain site so patient was started on IV Ancef and will be discharged to SNF on Keflex 1 tablet QID for 10 days to prevent a joint infection.  Patient benefited maximally from hospital stay and there were no other complications.    Recent vital signs: Patient Vitals for the past 24 hrs:  BP Temp Temp src Pulse Resp SpO2  07/24/14 0613 (!) 106/56 mmHg 97.9 F (36.6 C) Oral 66 16 98 %  07/24/14 0400 - - - - 16 98 %  07/24/14 0000  - - - - 16 97 %  07/23/14 2209 (!) 114/54 mmHg 98 F (36.7 C) Oral 67 16 97 %  07/23/14 2000 - - - - 16 97 %  07/23/14 1400 (!) 110/53 mmHg 98.4 F (36.9 C) - 65 18 96 %  07/23/14 1352 (!) 128/59 mmHg - - - - -  07/23/14 1327 (!) 113/53 mmHg - - - - -     Recent laboratory studies:  Recent Labs  07/23/14 0529  WBC 10.5  HGB 11.0*  HCT 32.6*  PLT 132*  NA 137  K 5.0  CL 106  CO2 25  BUN 25*  CREATININE 1.37*  GLUCOSE 146*  CALCIUM 8.3*     Discharge Medications:     Medication List    STOP taking these medications        CoQ10 100 MG Caps     cyanocobalamin 100 MCG tablet     fexofenadine 180 MG tablet  Commonly known as:  ALLEGRA     fluticasone 50 MCG/ACT nasal spray  Commonly known as:  FLONASE      TAKE these medications        atorvastatin 10 MG tablet  Commonly known as:  LIPITOR  Take 10 mg by mouth daily with breakfast.     azelastine 0.1 % nasal spray  Commonly known as:  ASTELIN  Place 1 spray into the nose every morning. For congestion; Use in each nostril as directed     celecoxib 200 MG capsule  Commonly known as:  CELEBREX  Take 1 capsule (200 mg total) by mouth daily.     cephALEXin 500 MG capsule  Commonly known as:  KEFLEX  Take 1 capsule (500 mg total) by mouth 4 (four) times daily.     cetirizine 10 MG tablet  Commonly known as:  ZYRTEC  - Take 10 mg by mouth daily. As needed   - Alternates monthly between this and Allegra  - Pt currently taking Allegra     cholecalciferol 1000 UNITS tablet  Commonly known as:  VITAMIN D  Take 1,000 Units by mouth daily.     docusate sodium 100 MG capsule  Commonly known as:  COLACE  Take 1 capsule (100 mg total) by mouth 2 (two) times daily.     EPINEPHrine 0.3 mg/0.3 mL Soaj injection  Commonly known as:  EPI-PEN  Inject 0.3 mg into the muscle once.     HYDROmorphone 2 MG tablet  Commonly known as:  DILAUDID  1-2 tablets every 4-6 hrs as needed for pain     magnesium gluconate  500 MG tablet  Commonly known as:  MAGONATE  Take 500 mg by mouth 2 (  two) times daily.     metoprolol tartrate 25 MG tablet  Commonly known as:  LOPRESSOR  Take 1 tablet (25 mg total) by mouth 2 (two) times daily.     multivitamin with minerals Tabs tablet  Take 1 tablet by mouth daily.     nitroGLYCERIN 0.4 MG SL tablet  Commonly known as:  NITROSTAT  Place 0.4 mg under the tongue every 5 (five) minutes as needed for chest pain.     polyethylene glycol packet  Commonly known as:  MIRALAX / GLYCOLAX  Take 17 g by mouth 2 (two) times daily.     ranitidine 75 MG tablet  Commonly known as:  ZANTAC  Take 150 mg by mouth daily as needed for heartburn.     rivaroxaban 10 MG Tabs tablet  Commonly known as:  XARELTO  Take 1 tablet (10 mg total) by mouth daily with breakfast.     SYSTANE 0.4-0.3 % Soln  Generic drug:  Polyethyl Glycol-Propyl Glycol  Place 1 drop into both eyes daily as needed (dry eyes).     vitamin C 500 MG tablet  Commonly known as:  ASCORBIC ACID  Take 500 mg by mouth daily.        Diagnostic Studies: No results found.  Disposition: stable to skilled nursing facililty      Discharge Instructions    CPM    Complete by:  As directed   Continuous passive motion machine (CPM):      Use the CPM from 0 to 90 for 6 hours per day.       You may break it up into 2 or 3 sessions per day.      Use CPM for 2 weeks or until you are told to stop.     Call MD / Call 911    Complete by:  As directed   If you experience chest pain or shortness of breath, CALL 911 and be transported to the hospital emergency room.  If you develope a fever above 101 F, pus (white drainage) or increased drainage or redness at the wound, or calf pain, call your surgeon's office.     Change dressing    Complete by:  As directed   Change the gauze dressing daily with sterile 4 x 4 inch gauze and apply TED hose.  DO NOT REMOVE BANDAGE OVER SURGICAL INCISION.  [place] WHOLE LEG INCLUDING OVER  THE WATERPROOF BANDAGE WITH SOAP AND WATER EVERY DAY.     Constipation Prevention    Complete by:  As directed   Drink plenty of fluids.  Prune juice may be helpful.  You may use a stool softener, such as Colace (over the counter) 100 mg twice a day.  Use MiraLax (over the counter) for constipation as needed.     Diet - low sodium heart healthy    Complete by:  As directed      Discharge instructions    Complete by:  As directed   INSTRUCTIONS AFTER JOINT REPLACEMENT   Remove items at home which could result in a fall. This includes throw rugs or furniture in walking pathways ICE to the affected joint every three hours while awake for 30 minutes at a time, for at least the first 3-5 days, and then as needed for pain and swelling.  Continue to use ice for pain and swelling. You may notice swelling that will progress down to the foot and ankle.  This is normal after surgery.  Elevate your leg  when you are not up walking on it.   Continue to use the breathing machine you got in the hospital (incentive spirometer) which will help keep your temperature down.  It is common for your temperature to cycle up and down following surgery, especially at night when you are not up moving around and exerting yourself.  The breathing machine keeps your lungs expanded and your temperature down.   DIET:  As you were doing prior to hospitalization, we recommend a well-balanced diet.  DRESSING / WOUND CARE / SHOWERING  Keep the surgical dressing until follow up.  The dressing is water proof, so you can shower without any extra covering.  IF THE DRESSING FALLS OFF or the wound gets wet inside, change the dressing with sterile gauze.  Please use good hand washing techniques before changing the dressing.  Do not use any lotions or creams on the incision until instructed by your surgeon.    ACTIVITY  Increase activity slowly as tolerated, but follow the weight bearing instructions below.   No driving for 4 weeks or  until further direction given by your physician.  You cannot drive while taking narcotics.  No lifting or carrying greater than 10 lbs. until further directed by your surgeon. Avoid periods of inactivity such as sitting longer than an hour when not asleep. This helps prevent blood clots.  You may return to work once you are authorized by your doctor.     WEIGHT BEARING   Weight bearing as tolerated with assist device (walker, cane, etc) as directed, use it as long as suggested by your surgeon or therapist, typically at least 4-6 weeks.   EXERCISES  Results after joint replacement surgery are often greatly improved when you follow the exercise, range of motion and muscle strengthening exercises prescribed by your doctor. Safety measures are also important to protect the joint from further injury. Any time any of these exercises cause you to have increased pain or swelling, decrease what you are doing until you are comfortable again and then slowly increase them. If you have problems or questions, call your caregiver or physical therapist for advice.   Rehabilitation is important following a joint replacement. After just a few days of immobilization, the muscles of the leg can become weakened and shrink (atrophy).  These exercises are designed to build up the tone and strength of the thigh and leg muscles and to improve motion. Often times heat used for twenty to thirty minutes before working out will loosen up your tissues and help with improving the range of motion but do not use heat for the first two weeks following surgery (sometimes heat can increase post-operative swelling).   These exercises can be done on a training (exercise) mat, on the floor, on a table or on a bed. Use whatever works the best and is most comfortable for you.    Use music or television while you are exercising so that the exercises are a pleasant break in your day. This will make your life better with the exercises acting  as a break in your routine that you can look forward to.   Perform all exercises about fifteen times, three times per day or as directed.  You should exercise both the operative leg and the other leg as well.    Exercises include:   Quad Sets - Tighten up the muscle on the front of the thigh (Quad) and hold for 5-10 seconds.   Straight Leg Raises - With your knee  straight, lift the leg to 60 degrees, hold for 3 seconds, and slowly lower the leg.  Perform this exercise against resistance later as your leg gets stronger.  Leg Slides: Lying on your back, slowly slide your foot toward your buttocks, bending your knee up off the floor. Then slowly slide your foot back down until your leg is flat on the floor again.  Angel Wings: Lying on your back spread your legs to the side as far apart as you can without causing discomfort.  Hamstring Strength:  Lying on your back, push your heel against the floor with your leg straight by tightening up the muscles of your buttocks.  Repeat, but this time bend your knee to a comfortable angle, and push your heel against the floor.  You may put a pillow under the heel to make it more comfortable if necessary.   A rehabilitation program following joint replacement surgery can speed recovery and prevent re-injury in the future due to weakened muscles. Contact your doctor or a physical therapist for more information on knee rehabilitation.    CONSTIPATION  Constipation is defined medically as fewer than three stools per week and severe constipation as less than one stool per week.  Even if you have a regular bowel pattern at home, your normal regimen is likely to be disrupted due to multiple reasons following surgery.  Combination of anesthesia, postoperative narcotics, change in appetite and fluid intake all can affect your bowels.   YOU MUST use at least one of the following options; they are listed in order of increasing strength to get the job done.  They are all  available over the counter, and you may need to use some, POSSIBLY even all of these options:    Drink plenty of fluids (prune juice may be helpful) and high fiber foods Colace 100 mg by mouth twice a day  Senokot for constipation as directed and as needed Dulcolax (bisacodyl), take with full glass of water  Miralax (polyethylene glycol) once or twice a day as needed.  If you have tried all these things and are unable to have a bowel movement in the first 3-4 days after surgery try Magnesium Citrate.  1/2 bottle in the morning.  If no results the other half of the bottle the next morning.  If still no results, call either your surgeon or your primary doctor.    If you experience loose stools or diarrhea, hold the medications until you stool forms back up.  If your symptoms do not get better within 1 week or if they get worse, check with your doctor.  If you experience "the worst abdominal pain ever" or develop nausea or vomiting, please contact the office immediately for further recommendations for treatment.   ITCHING:  If you experience itching with your medications, try taking only a single pain pill, or even half a pain pill at a time.  You can also use Benadryl over the counter for itching or also to help with sleep.   TED HOSE STOCKINGS:  Use stockings on both legs until for at least 2 weeks or as directed by physician office. They may be removed at night for sleeping.  MEDICATIONS:  See your medication summary on the "After Visit Summary" that nursing will review with you.  You may have some home medications which will be placed on hold until you complete the course of blood thinner medication.  It is important for you to complete the blood thinner medication as prescribed.  PRECAUTIONS:  If you experience chest pain or shortness of breath - call 911 immediately for transfer to the hospital emergency department.   If you develop a fever greater that 101 F, purulent drainage from wound,  increased redness or drainage from wound, foul odor from the wound/dressing, or calf pain - CONTACT YOUR SURGEON.                                                   FOLLOW-UP APPOINTMENTS:  If you do not already have a post-op appointment, please call the office for an appointment to be seen by your surgeon.  Guidelines for how soon to be seen are listed in your "After Visit Summary", but are typically between 1-4 weeks after surgery.  OTHER INSTRUCTIONS:   Knee Replacement:  Do not place pillow under knee, focus on keeping the knee straight while resting. CPM instructions: 0-90 degrees, 2 hours in the morning, 2 hours in the afternoon, and 2 hours in the evening. Place foam block, curve side up under heel at all times except when in CPM or when walking.  DO NOT modify, tear, cut, or change the foam block in any way.  MAKE SURE YOU:  Understand these instructions.  Get help right away if you are not doing well or get worse.    Thank you for letting us be a part of your medical care team.  It is a privilege we respect greatly.  We hope these instructions will help you stay on track for a fast and full recovery!     Do not put a pillow under the knee. Place it under the heel.    Complete by:  As directed   Place gray foam block, curve side up under heel at all times except when in CPM or when walking.  DO NOT modify, tear, cut, or change in any way the gray foam block.     Increase activity slowly as tolerated    Complete by:  As directed      TED hose    Complete by:  As directed   Use stockings (TED hose) for 2 weeks on both leg(s).  You may remove them at night for sleeping.           Follow-up Information    Follow up with Lorn Junes, MD On 08/05/2014.   Specialty:  Orthopedic Surgery   Why:  appt time 4 pm   Contact information:   [address] [place] [address] [phone]        Signed: Linda Hedges 07/24/2014, 7:48 AM

## 2014-07-24 NOTE — Discharge Planning (Signed)
Patient to be discharged to [facility]. Patient updated at bedside.  Facility: [facility] Report number: [phone] Transportation: EMS ([place])  Lubertha Sayres, [place] ([phone]) Licensed Clinical Social Worker Orthopedics [phone]) and Surgical [phone])

## 2014-07-25 ENCOUNTER — Non-Acute Institutional Stay (SKILLED_NURSING_FACILITY): Payer: Medicare PPO | Admitting: Internal Medicine

## 2014-07-25 ENCOUNTER — Encounter: Payer: Self-pay | Admitting: Internal Medicine

## 2014-07-25 DIAGNOSIS — K219 Gastro-esophageal reflux disease without esophagitis: Secondary | ICD-10-CM

## 2014-07-25 DIAGNOSIS — D62 Acute posthemorrhagic anemia: Secondary | ICD-10-CM | POA: Diagnosis not present

## 2014-07-25 DIAGNOSIS — K5901 Slow transit constipation: Secondary | ICD-10-CM

## 2014-07-25 DIAGNOSIS — M1711 Unilateral primary osteoarthritis, right knee: Secondary | ICD-10-CM | POA: Diagnosis not present

## 2014-07-25 DIAGNOSIS — I482 Chronic atrial fibrillation, unspecified: Secondary | ICD-10-CM

## 2014-07-25 DIAGNOSIS — M62838 Other muscle spasm: Secondary | ICD-10-CM

## 2014-07-25 NOTE — Progress Notes (Signed)
Patient ID: Donald Gray, male   DOB: 14-Jan-1941, 74 y.o.   MRN: [id number]     [facility]   PCP: Irven Shelling, MD  Code Status: full code  Allergies  Allergen Reactions  . Penicillins Swelling  . Ambien [Zolpidem Tartrate] Other (See Comments)    Causes hallucinations.   . Codeine     hallucinations     Chief Complaint  Patient presents with  . New Admit To SNF     HPI:  74 year old patient is here for short term rehabilitation post hospital admission from 07/22/14-07/24/14 with right knee OA. He underwent right TKA. He is seen in his room today. He has PMH of CHF, Afib, HTN, GERD, diet controlled DM, HLD, DJD among others His pain is under control but has muscle spasm. He also feels his ted hose is causing discomfort at posterior knee area. Had a bowel movement this am. His wife is present during the visit who is a retired Marine scientist. No other concerns  Review of Systems:  Constitutional: Negative for fever, chills, malaise/fatigue and diaphoresis.  HENT: Negative for headache, congestion  Respiratory: Negative for cough, shortness of breath and wheezing.   Cardiovascular: Negative for chest pain, palpitations. Positive for leg swelling.  Gastrointestinal: Negative for heartburn, nausea, vomiting, abdominal pain Genitourinary: Negative for dysuria.  Musculoskeletal: Negative for back pain, falls Skin: Negative for itching, rash.  Neurological: Negative for dizziness, tingling, focal weakness Psychiatric/Behavioral: Negative for depression, anxiety, insomnia and memory loss.    Past Medical History  Diagnosis Date  . Hypertension   . Coronary artery disease     a. s/p MI with stent in 2000  . Hyperlipidemia   . Shortness of breath     a. related to chemo drugs  . Anemia   . GERD (gastroesophageal reflux disease)   . Diverticulosis   . PAF (paroxysmal atrial fibrillation)   . Myocardial infarction 1997  . Type II  diabetes mellitus     "treating w/diet and weight loss"  . History of blood transfusion     "w/nephrectomy; w/shoulder replacement; w/knee replacement"  . Arthritis     "fingers, toes" (10/05/2013)  . Left knee DJD   . Anticoagulated- to go to Xarelto for a fib 10/07/2013  . NSTEMI (non-ST elevated myocardial infarction), type 2 due to demand ischemia 10/07/2013  . Dysrhythmia   . Neuropathy     feet bilat   . Pneumonia ~ 2007    again 3 months ago   . Tendonitis     currently on prednisone and wearing brace (left wrist)  . Primary localized osteoarthritis of right knee   . Renal cell carcinoma     a. s/p nephrectomy   Past Surgical History  Procedure Laterality Date  . Nephrectomy Right 08/14/2009  . Knee arthroscopy Right ~ 2011  . Pilonidal cyst drainage    . Total shoulder replacement Right 1997  . Joint replacement    . Inguinal hernia repair  09/22/2011    Procedure: HERNIA REPAIR INGUINAL ADULT BILATERAL;  Surgeon: Imogene Burn. Georgette Dover, MD;  Location: [facility];  Service: General;  Laterality: Bilateral;  . Orchiectomy  09/22/2011    Procedure: ORCHIECTOMY;  Surgeon: Franchot Gallo, MD;  Location: [facility];  Service: Urology;  Laterality: Left;  Left Testicular Exploration, Inguinal Approach,  Testicular   . Knee arthroscopy Left 11/13/2012    Procedure: ARTHROSCOPY KNEE;  Surgeon: Lorn Junes, MD;  Location: [place];  Service: Orthopedics;  Laterality: Left;  . Total knee arthroplasty Left 11/13/2012    Procedure: TOTAL KNEE ARTHROPLASTY;  Surgeon: Lorn Junes, MD;  Location: [place];  Service: Orthopedics;  Laterality: Left;  . Cheilectomy Right 04/13/2013    Procedure: Right Great Toe Cheilectomy, Debride Tendon and Abscess, Apply Stimulan Beads;  Surgeon: Newt Minion, MD;  Location: [place];  Service: Orthopedics;  Laterality: Right;  Right Great Toe Cheilectomy, Debride Tendon and Abscess, Apply Stimulan Beads  . Inguinal hernia repair Bilateral 09/22/11  . Tonsillectomy and  adenoidectomy  1948  . Cardiac catheterization  05/2009  . Coronary angioplasty with stent placement  1997    at [facility] to RCA   . Lung biopsy  2011  . Tee without cardioversion N/A 10/08/2013    Procedure: TRANSESOPHAGEAL ECHOCARDIOGRAM (TEE);  Surgeon: Dorothy Spark, MD;  Location: [place];  Service: Cardiovascular;  Laterality: N/A;  . Cardioversion N/A 10/08/2013    Procedure: CARDIOVERSION;  Surgeon: Dorothy Spark, MD;  Location: [place];  Service: Cardiovascular;  Laterality: N/A;  . Colonscopy     . Mass excision Right 04/12/2014    Procedure: MASS EXCISION DORSUM OF RIGHT FOOT  ;  Surgeon: Tobi Bastos, MD;  Location: [facility];  Service: Orthopedics;  Laterality: Right;  . Total knee arthroplasty Right 07/22/2014    Procedure: RIGHT TOTAL KNEE ARTHROPLASTY;  Surgeon: Elsie Saas, MD;  Location: [place];  Service: Orthopedics;  Laterality: Right;   Social History:   reports that he quit smoking about 53 years ago. His smoking use included Cigarettes. He has a 2 pack-year smoking history. He has never used smokeless tobacco. He reports that he drinks about 0.6 oz of alcohol per week. He reports that he does not use illicit drugs.  Family History  Problem Relation Age of Onset  . Heart disease Father     Died, 4  . Thyroid disease Mother     Living, 22  . Thyroid disease Brother   . Dementia Mother   . Hypertension Father   . Healthy Daughter   . Skin cancer Son     Medications: Patient's Medications  New Prescriptions   No medications on file  Previous Medications   ATORVASTATIN (LIPITOR) 10 MG TABLET    Take 10 mg by mouth daily with breakfast.    AZELASTINE (ASTELIN) 137 MCG/SPRAY NASAL SPRAY    Place 1 spray into the nose every morning. For congestion; Use in each nostril as directed   CELECOXIB (CELEBREX) 200 MG CAPSULE    Take 1 capsule (200 mg total) by mouth daily.   CEPHALEXIN (KEFLEX) 500 MG CAPSULE    Take 1 capsule (500 mg total) by mouth 4 (four)  times daily.   CETIRIZINE (ZYRTEC) 10 MG TABLET    Take 10 mg by mouth daily. As needed  Alternates monthly between this and Allegra Pt currently taking Allegra   CHOLECALCIFEROL (VITAMIN D) 1000 UNITS TABLET    Take 1,000 Units by mouth daily.   DOCUSATE SODIUM (COLACE) 100 MG CAPSULE    Take 1 capsule (100 mg total) by mouth 2 (two) times daily.   EPINEPHRINE (EPI-PEN) 0.3 MG/0.3 ML SOAJ    Inject 0.3 mg into the muscle once.   HYDROMORPHONE (DILAUDID) 2 MG TABLET    1-2 tablets every 4-6 hrs as needed for pain   MAGNESIUM GLUCONATE (MAGONATE) 500 MG TABLET    Take 500 mg by mouth 2 (two) times daily.  METOPROLOL TARTRATE (LOPRESSOR) 25 MG TABLET    Take 1 tablet (25 mg total) by mouth 2 (two) times daily.   MULTIPLE VITAMIN (MULTIVITAMIN WITH MINERALS) TABS    Take 1 tablet by mouth daily.   NITROGLYCERIN (NITROSTAT) 0.4 MG SL TABLET    Place 0.4 mg under the tongue every 5 (five) minutes as needed for chest pain.   POLYETHYL GLYCOL-PROPYL GLYCOL (SYSTANE) 0.4-0.3 % SOLN    Place 1 drop into both eyes daily as needed (dry eyes).   POLYETHYLENE GLYCOL (MIRALAX / GLYCOLAX) PACKET    Take 17 g by mouth 2 (two) times daily.   RANITIDINE (ZANTAC) 75 MG TABLET    Take 150 mg by mouth daily as needed for heartburn.    RIVAROXABAN (XARELTO) 10 MG TABS TABLET    Take 1 tablet (10 mg total) by mouth daily with breakfast.   VITAMIN C (ASCORBIC ACID) 500 MG TABLET    Take 500 mg by mouth daily.   Modified Medications   No medications on file  Discontinued Medications   No medications on file     Physical Exam:  Filed Vitals:   07/25/14 1612  BP: 128/62  Pulse: 68  Temp: 98.3 F (36.8 C)  Resp: 18  SpO2: 98%    General- elderly male, well built, in no acute distress Head- normocephalic, atraumatic Throat- moist mucus membrane Eyes- PERRLA, EOMI, no pallor, no icterus, no discharge, normal conjunctiva, normal sclera Neck- no cervical lymphadenopathy Cardiovascular- normal s1,s2, no  murmurs, palpable dorsalis pedis and radial pulses, 1+ right leg edema Respiratory- bilateral clear to auscultation, no wheeze, no rhonchi, no crackles, no use of accessory muscles Abdomen- bowel sounds present, soft, non tender Musculoskeletal- able to move all 4 extremities Neurological- no focal deficit Skin- warm and dry, aquacel dressing on right knee, bruising in right calf Psychiatry- alert and oriented to person, place and time, normal mood and affect    Labs reviewed: Basic Metabolic Panel:  Recent Labs  10/05/13 1717  07/12/14 1236 07/23/14 0529 07/24/14 0636  NA  --   < > 137 137 139  K  --   < > 4.2 5.0 4.7  CL  --   < > 106 106 113*  CO2  --   < > 23 25 22   GLUCOSE  --   < > 142* 146* 121*  BUN  --   < > 20 25* 22  CREATININE  --   < > 1.23 1.37* 1.18  CALCIUM  --   < > 9.2 8.3* 8.3*  MG 2.1  --   --   --   --   < > = values in this interval not displayed. Liver Function Tests:  Recent Labs  07/12/14 1236  AST 25  ALT 40  ALKPHOS 79  BILITOT 0.8  PROT 7.1  ALBUMIN 4.2   No results for input(s): LIPASE, AMYLASE in the last 8760 hours. No results for input(s): AMMONIA in the last 8760 hours. CBC:  Recent Labs  07/12/14 1236 07/23/14 0529 07/24/14 0636  WBC 5.7 10.5 10.0  NEUTROABS 3.3  --   --   HGB 15.3 11.0* 9.9*  HCT 44.2 32.6* 29.7*  MCV 87.7 89.3 89.7  PLT 167 132* 137*   Cardiac Enzymes:  Recent Labs  10/05/13 1717 10/05/13 2250 10/06/13 0442  TROPONINI 0.88* 0.58* 0.69*   BNP: Invalid input(s): POCBNP CBG:  Recent Labs  07/23/14 2212 07/24/14 0616 07/24/14 1121  GLUCAP 203* 142* 177*  Assessment/Plan  Right knee OA S/p TKA. Continue xarelto for dvt prophylaxis, celebrex 200 mg daily for pain, dilaudid 2 mg 1-2 tab q4-6 h prn for pain. On keflex for 10 days for empiric treatment. Continue vit d supplement. Will change his ted hose to thigh high, to use CPM machine.Has f/u with orthopedics  Muscle spasm Post  surgery, add robaxin 500 mg q8h prn for muscle spasticity  Anemia Blood loss anemia post op, check cbc in 1 week  Constipation Continue colace 100 mg bid and miralax 17 g bid for now  afib Rate controlled, continue lopressor 25 mg bid and xarelto  gerd Worsened, change zantac to 150 mg daily at 6 am and reassess   Goals of care: short term rehabilitation   Labs/tests ordered: cbc in 1 week  Family/ staff Communication: reviewed care plan with patient and nursing supervisor    Blanchie Serve, MD  [place] (Monday-Friday 8 am - 5 pm) [phone] (afterhours)

## 2014-07-30 ENCOUNTER — Non-Acute Institutional Stay (SKILLED_NURSING_FACILITY): Payer: Medicare PPO | Admitting: Adult Health

## 2014-07-30 ENCOUNTER — Encounter: Payer: Self-pay | Admitting: Adult Health

## 2014-07-30 DIAGNOSIS — K219 Gastro-esophageal reflux disease without esophagitis: Secondary | ICD-10-CM

## 2014-07-30 DIAGNOSIS — D62 Acute posthemorrhagic anemia: Secondary | ICD-10-CM

## 2014-07-30 DIAGNOSIS — E785 Hyperlipidemia, unspecified: Secondary | ICD-10-CM

## 2014-07-30 DIAGNOSIS — K5901 Slow transit constipation: Secondary | ICD-10-CM

## 2014-07-30 DIAGNOSIS — M1711 Unilateral primary osteoarthritis, right knee: Secondary | ICD-10-CM

## 2014-07-30 DIAGNOSIS — I482 Chronic atrial fibrillation, unspecified: Secondary | ICD-10-CM

## 2014-07-30 DIAGNOSIS — M62838 Other muscle spasm: Secondary | ICD-10-CM | POA: Diagnosis not present

## 2014-07-30 NOTE — Progress Notes (Signed)
Patient ID: Donald Gray, male   DOB: [birth year], 74 y.o.   MRN: [id number]   07/30/2014  Facility:  [facility] Home Location:  [place] Room Number: 207-P LEVEL OF CARE:  SNF (31)   Chief Complaint  Patient presents with  . Discharge Note    Osteoarthritis S/P right total knee arthroplasty, anemia, atrial fibrillation, GERD, muscle spasm, hyperlipidemia and constipation    HISTORY OF PRESENT ILLNESS:  This is a 74 year old male who is for discharge home and will have outpatient physical therapy. He has PMH of hypertension, CAD S/P MI with stent in 2000, GERD, atrial fibrillation and neuropathy. He has been admitted to [facility] on 07/24/14 from [facility] with osteoarthritis S/P Right total knee arthroplasty.  Patient was admitted to this facility for short-term rehabilitation after the patient's recent hospitalization.  Patient has completed SNF rehabilitation and therapy has cleared the patient for discharge.  PAST MEDICAL HISTORY:  Past Medical History  Diagnosis Date  . Hypertension   . Coronary artery disease     a. s/p MI with stent in 2000  . Hyperlipidemia   . Shortness of breath     a. related to chemo drugs  . Anemia   . GERD (gastroesophageal reflux disease)   . Diverticulosis   . PAF (paroxysmal atrial fibrillation)   . Myocardial infarction 1997  . Type II diabetes mellitus     "treating w/diet and weight loss"  . History of blood transfusion     "w/nephrectomy; w/shoulder replacement; w/knee replacement"  . Arthritis     "fingers, toes" (10/05/2013)  . Left knee DJD   . Anticoagulated- to go to Xarelto for a fib 10/07/2013  . NSTEMI (non-ST elevated myocardial infarction), type 2 due to demand ischemia 10/07/2013  . Dysrhythmia   . Neuropathy     feet bilat   . Pneumonia ~ 2007    again 3 months ago   . Tendonitis     currently on prednisone and wearing brace (left wrist)  . Primary localized osteoarthritis of  right knee   . Renal cell carcinoma     a. s/p nephrectomy    CURRENT MEDICATIONS: Reviewed per MAR/see medication list  Allergies  Allergen Reactions  . Penicillins Swelling  . Ambien [Zolpidem Tartrate] Other (See Comments)    Causes hallucinations.   . Codeine     hallucinations      REVIEW OF SYSTEMS:  GENERAL: no change in appetite, no fatigue, no weight changes, no fever, chills or weakness RESPIRATORY: no cough, SOB, DOE, wheezing, hemoptysis CARDIAC: no chest pain, or palpitations GI: no abdominal pain, diarrhea, constipation, heart burn, nausea or vomiting  PHYSICAL EXAMINATION  GENERAL: no acute distress, obese  SKIN:  Right knee surgical incision is dry, bruising noted EYES: conjunctivae normal, sclerae normal, normal eye lids NECK: supple, trachea midline, no neck masses, no thyroid tenderness, no thyromegaly LYMPHATICS: no LAN in the neck, no supraclavicular LAN RESPIRATORY: breathing is even & unlabored, BS CTAB CARDIAC: RRR, no murmur,no extra heart sounds, RLE edema 1+ GI: abdomen soft, normal BS, no masses, no tenderness, no hepatomegaly, no splenomegaly EXTREMITIES: Able to move 4 extremities; ambulates with walker PSYCHIATRIC: the patient is alert & oriented to person, affect & behavior appropriate  LABS/RADIOLOGY: Labs reviewed: Basic Metabolic Panel:  Recent Labs  10/05/13 1717  07/12/14 1236 07/23/14 0529 07/24/14 0636  NA  --   < > 137 137 139  K  --   < >  4.2 5.0 4.7  CL  --   < > 106 106 113*  CO2  --   < > 23 25 22   GLUCOSE  --   < > 142* 146* 121*  BUN  --   < > 20 25* 22  CREATININE  --   < > 1.23 1.37* 1.18  CALCIUM  --   < > 9.2 8.3* 8.3*  MG 2.1  --   --   --   --   < > = values in this interval not displayed. Liver Function Tests:  Recent Labs  07/12/14 1236  AST 25  ALT 40  ALKPHOS 79  BILITOT 0.8  PROT 7.1  ALBUMIN 4.2   CBC:  Recent Labs  07/12/14 1236 07/23/14 0529 07/24/14 0636  WBC 5.7 10.5 10.0    NEUTROABS 3.3  --   --   HGB 15.3 11.0* 9.9*  HCT 44.2 32.6* 29.7*  MCV 87.7 89.3 89.7  PLT 167 132* 137*   Lipid Panel:  Recent Labs  10/06/13 0442  HDL 43   Cardiac Enzymes:  Recent Labs  10/05/13 1717 10/05/13 2250 10/06/13 0442  TROPONINI 0.88* 0.58* 0.69*   CBG:  Recent Labs  07/23/14 2212 07/24/14 0616 07/24/14 1121  GLUCAP 203* 142* 177*    ASSESSMENT/PLAN:  Osteoarthritis S/P right total knee arthroplasty - for outpatient rehabilitation; continue Xarelto 10 mg 1 tab by mouth daily for DVT prophylaxis; Keflex for empiric treatment; Celebrex 200 mg by mouth daily and Dilaudid 2 mg 1-2 tabs by mouth every 4 hours when necessary for pain Muscle spasm - continue Robaxin 500 mg by mouth every 8 hours when necessary Chronic atrial fibrillation - rate controlled; continue Lopressor 5 mg by mouth twice a day GERD - continue Zantac 150 mg by mouth daily Hyperlipidemia - continue Lipitor 10 mg by mouth daily Constipation - continue Colace 100 mg by mouth twice a day and MiraLAX 17 g last 4-6 ounces liquid by mouth twice a day Anemia, acute blood loss - hemoglobin 9.9; stable   I have filled out patient's discharge paperwork and written prescriptions.  Patient will have outpatient physical therapy.  Total discharge time: Less than 30 minutes  Discharge time involved coordination of the discharge process with Education officer, museum, nursing staff and therapy department.     [facility], NP [employer] [phone]

## 2014-08-06 ENCOUNTER — Telehealth: Payer: Self-pay | Admitting: Interventional Cardiology

## 2014-08-06 NOTE — Telephone Encounter (Signed)
New message      Pt c/o medication issue:  1. Name of Medication: xarelto 2. How are you currently taking this medication (dosage and times per day)? 10mg  3. Are you having a reaction (difficulty breathing--STAT)? no  4. What is your medication issue? Can pt take celebrex while on xarelto?

## 2014-08-06 NOTE — Telephone Encounter (Signed)
Pt states he is 2 weeks post knee replacement. Pt states ortho MD recommended to decrease Xarelto dose to 10mg  daily for 2 weeks post op then increase back to usual dose of 20mg  daily.  Pt states he will start on increased dose of Xarelto 20mg  daily tomorrow.  Pt states ortho MD prescribed Celebrex to take short term post op but asked him to call and get Ok from Dr Tamala Julian  to take it.   Pt advised I will forward to Dr Tamala Julian for review and recommendations.

## 2014-08-07 NOTE — Telephone Encounter (Signed)
Follow up      Pt is calling to see if Dr Tamala Julian approved for him to take celebrex.

## 2014-08-07 NOTE — Telephone Encounter (Signed)
Calling again to see if Dr. Tamala Julian responded to him taking Celebrax with him being on Xarelto.  Spoke w/ Dr. Tamala Julian who states he would prefer that he not take the Celebrax.  Notified pt.  He states so far he has been able to manage pain with Tylenol. He states he is 2 wks out from knee replacement. He verbalizes understanding.

## 2014-12-05 ENCOUNTER — Other Ambulatory Visit: Payer: Self-pay

## 2014-12-05 MED ORDER — RIVAROXABAN 20 MG PO TABS: 20.0000 mg | ORAL_TABLET | Freq: Every day | ORAL | Status: DC

## 2014-12-05 MED ORDER — METOPROLOL TARTRATE 25 MG PO TABS: 25.0000 mg | ORAL_TABLET | Freq: Two times a day (BID) | ORAL | Status: DC

## 2014-12-18 IMAGING — CR DG FOOT COMPLETE 3+V*R*
3 series · 3 of 3 positions shown · non-contrast
Comparison: None.

CLINICAL DATA: Pain and swelling of the right 1st MTP joint, no
acute injury

EXAM:
RIGHT FOOT COMPLETE - 3+ VIEW

[view not recorded (1 of 3)]
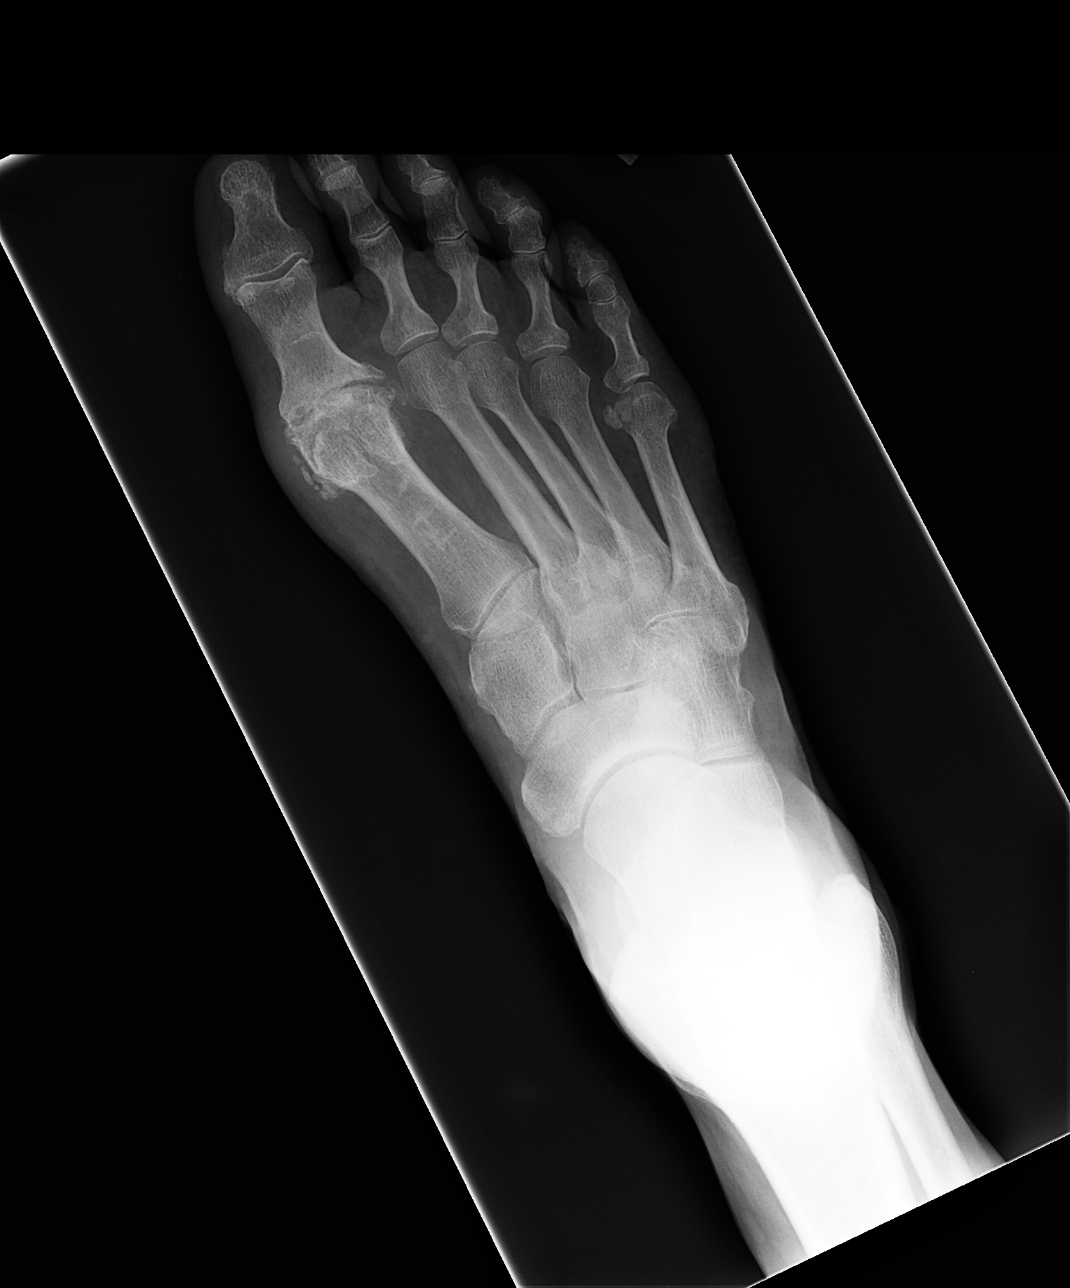

[view not recorded (2 of 3)]
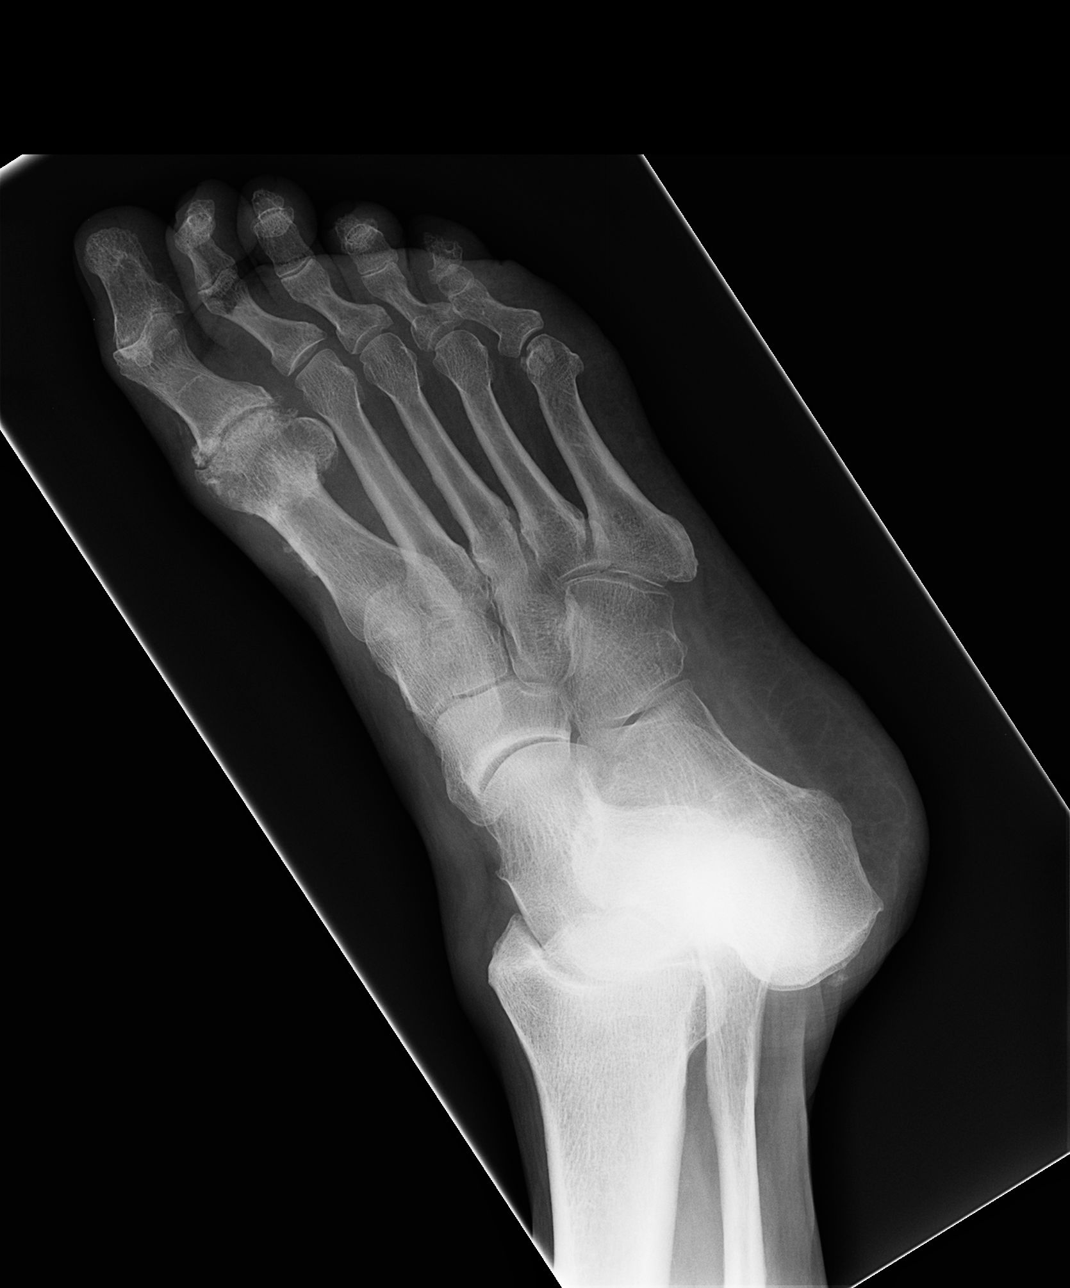

[view not recorded (3 of 3)]
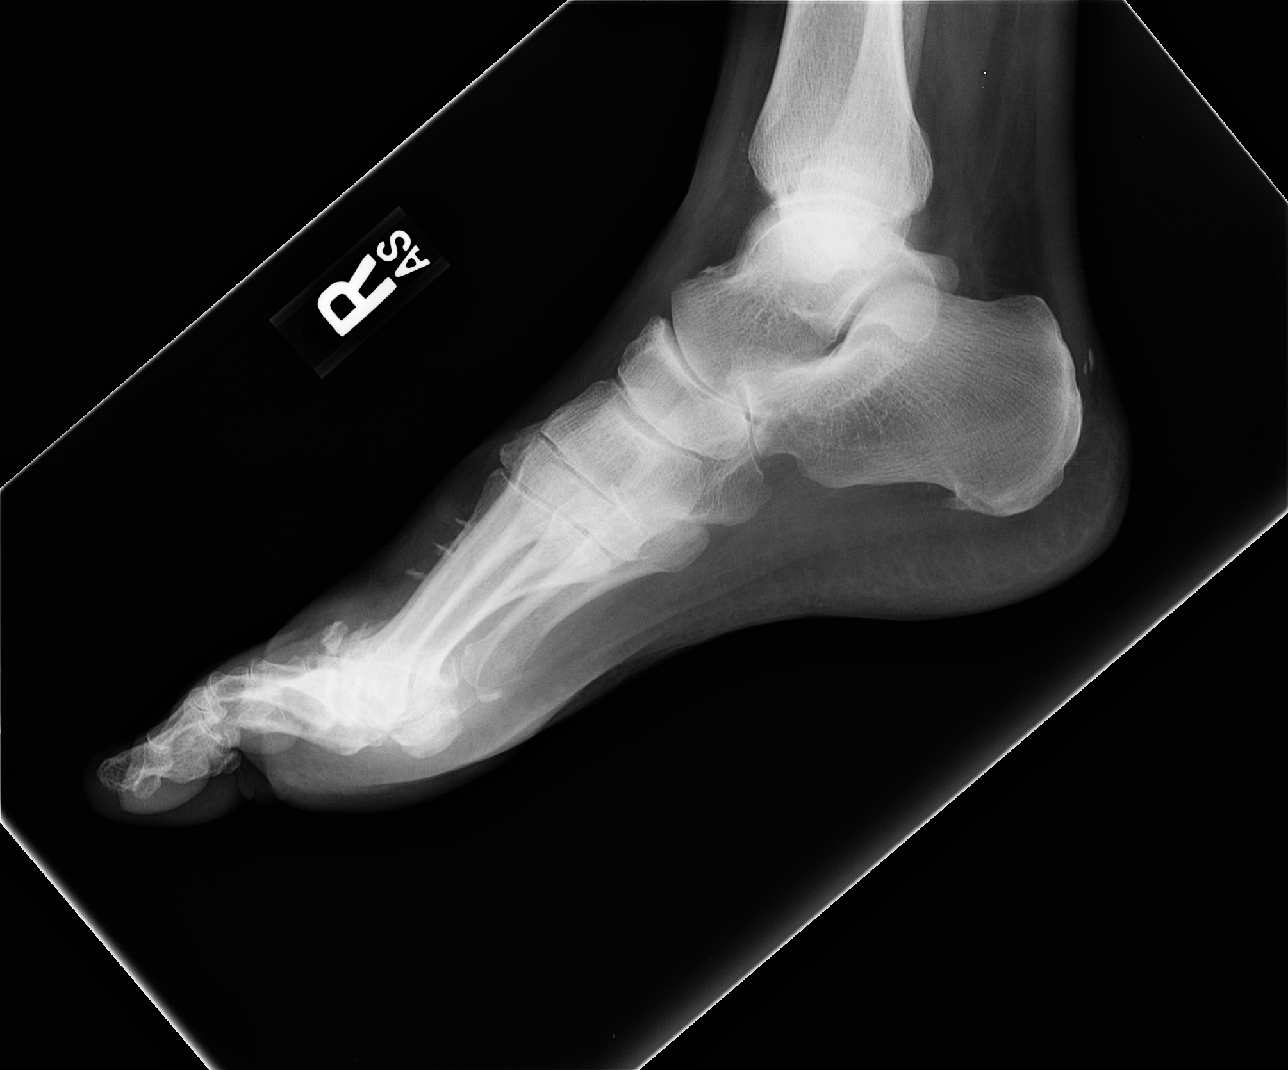

[3 of 3 positions shown; findings below may reference images not displayed]

FINDINGS: There is loss of joint space of the right 1st MTP joint with
sclerosis and spurring. However there is also suggestion of an
erosion, and soft tissue calcification, suggesting gout as the
etiology. The remainder of joint spaces are unremarkable. A small
plantar calcaneal degenerative spur is noted.
IMPRESSION: Changes of both osteoarthritis and probable gout involving the right
1st MTP joint.

## 2014-12-19 ENCOUNTER — Other Ambulatory Visit: Payer: Self-pay

## 2014-12-19 MED ORDER — RIVAROXABAN 20 MG PO TABS: 20.0000 mg | ORAL_TABLET | Freq: Every day | ORAL | Status: DC

## 2015-07-02 ENCOUNTER — Telehealth: Payer: Self-pay | Admitting: Interventional Cardiology

## 2015-07-02 ENCOUNTER — Telehealth: Payer: Self-pay

## 2015-07-02 NOTE — Telephone Encounter (Signed)
Spoke with patient and let him know I have sent in for his Tier Exception

## 2015-07-02 NOTE — Telephone Encounter (Signed)
Pt wants to know if you received a fax on his Xarelto. This fax is for a tier reduction.

## 2015-07-02 NOTE — Telephone Encounter (Signed)
Tier exception for Xarelto sent to [facility].

## 2015-07-24 ENCOUNTER — Ambulatory Visit (INDEPENDENT_AMBULATORY_CARE_PROVIDER_SITE_OTHER): Payer: Medicare PPO | Admitting: Interventional Cardiology

## 2015-07-24 ENCOUNTER — Encounter: Payer: Self-pay | Admitting: Interventional Cardiology

## 2015-07-24 VITALS — BP 136/70 | HR 57 | Ht 71.0 in | Wt 220.2 lb

## 2015-07-24 DIAGNOSIS — C641 Malignant neoplasm of right kidney, except renal pelvis: Secondary | ICD-10-CM

## 2015-07-24 DIAGNOSIS — I2510 Atherosclerotic heart disease of native coronary artery without angina pectoris: Secondary | ICD-10-CM | POA: Diagnosis not present

## 2015-07-24 DIAGNOSIS — I480 Paroxysmal atrial fibrillation: Secondary | ICD-10-CM | POA: Diagnosis not present

## 2015-07-24 DIAGNOSIS — I10 Essential (primary) hypertension: Secondary | ICD-10-CM | POA: Diagnosis not present

## 2015-07-24 DIAGNOSIS — I5033 Acute on chronic diastolic (congestive) heart failure: Secondary | ICD-10-CM

## 2015-07-24 NOTE — Patient Instructions (Signed)
Medication Instructions:  Your physician recommends that you continue on your current medications as directed. Please refer to the Current Medication list given to you today.   Labwork: None ordered  Testing/Procedures: Your physician has requested that you have a lexiscan myoview. For further information please visit [email or web address]. Please follow instruction sheet, as given.   Follow-Up: Your physician wants you to follow-up in: 1 year or sooner pending test results You will receive a reminder letter in the mail two months in advance. If you don't receive a letter, please call our office to schedule the follow-up appointment.   Any Other Special Instructions Will Be Listed Below (If Applicable).     If you need a refill on your cardiac medications before your next appointment, please call your pharmacy.

## 2015-07-24 NOTE — Progress Notes (Signed)
Cardiology Office Note   Date:  07/24/2015   ID:  Donald Gray, DOB [birth year], MRN [id number]  PCP:  Irven Shelling, MD  Cardiologist:  Sinclair Grooms, MD   Chief Complaint  Patient presents with  . Congestive Heart Failure      History of Present Illness: Donald Gray is a 75 y.o. male who presents for CAD, diastolic HF, and PAF.  Having dyspnea on exertion without chest pain. No orthopnea or PND.The dyspnea is preventing him from building his endurance. He has had successful treatment for metastatic renal cell carcinoma. He underwent targeted therapy.  Past Medical History  Diagnosis Date  . Hypertension   . Coronary artery disease     a. s/p MI with stent in 2000  . Hyperlipidemia   . Shortness of breath     a. related to chemo drugs  . Anemia   . GERD (gastroesophageal reflux disease)   . Diverticulosis   . PAF (paroxysmal atrial fibrillation) ([place])   . Myocardial infarction ([place]) 1997  . Type II diabetes mellitus ([place])     "treating w/diet and weight loss"  . History of blood transfusion     "w/nephrectomy; w/shoulder replacement; w/knee replacement"  . Arthritis     "fingers, toes" (10/05/2013)  . Left knee DJD   . Anticoagulated- to go to Xarelto for a fib 10/07/2013  . NSTEMI (non-ST elevated myocardial infarction), type 2 due to demand ischemia 10/07/2013  . Dysrhythmia   . Neuropathy (HCC)     feet bilat   . Pneumonia ~ 2007    again 3 months ago   . Tendonitis     currently on prednisone and wearing brace (left wrist)  . Primary localized osteoarthritis of right knee   . Renal cell carcinoma ([place])     a. s/p nephrectomy    Past Surgical History  Procedure Laterality Date  . Nephrectomy Right 08/14/2009  . Knee arthroscopy Right ~ 2011  . Pilonidal cyst drainage    . Total shoulder replacement Right 1997  . Joint replacement    . Inguinal hernia repair  09/22/2011    Procedure: HERNIA REPAIR INGUINAL ADULT BILATERAL;  Surgeon:  Imogene Burn. Georgette Dover, MD;  [facility];  Service: General;  Laterality: Bilateral;  . Orchiectomy  09/22/2011    Procedure: ORCHIECTOMY;  Surgeon: Franchot Gallo, MD;  [facility];  Service: Urology;  Laterality: Left;  Left Testicular Exploration, Inguinal Approach,  Testicular   . Knee arthroscopy Left 11/13/2012    Procedure: ARTHROSCOPY KNEE;  Surgeon: Lorn Junes, MD;  Location: [place];  Service: Orthopedics;  Laterality: Left;  . Total knee arthroplasty Left 11/13/2012    Procedure: TOTAL KNEE ARTHROPLASTY;  Surgeon: Lorn Junes, MD;  Location: [place];  Service: Orthopedics;  Laterality: Left;  . Cheilectomy Right 04/13/2013    Procedure: Right Great Toe Cheilectomy, Debride Tendon and Abscess, Apply Stimulan Beads;  Surgeon: Newt Minion, MD;  Location: [place];  Service: Orthopedics;  Laterality: Right;  Right Great Toe Cheilectomy, Debride Tendon and Abscess, Apply Stimulan Beads  . Inguinal hernia repair Bilateral 09/22/11  . Tonsillectomy and adenoidectomy  1948  . Cardiac catheterization  05/2009  . Coronary angioplasty with stent placement  1997    at [facility] to RCA   . Lung biopsy  2011  . Tee without cardioversion N/A 10/08/2013    Procedure: TRANSESOPHAGEAL ECHOCARDIOGRAM (TEE);  Surgeon: Dorothy Spark, MD;  Location: [place];  Service: Cardiovascular;  Laterality: N/A;  . Cardioversion N/A 10/08/2013    Procedure: CARDIOVERSION;  Surgeon: Dorothy Spark, MD;  Location: [place];  Service: Cardiovascular;  Laterality: N/A;  . Colonscopy     . Mass excision Right 04/12/2014    Procedure: MASS EXCISION DORSUM OF RIGHT FOOT  ;  Surgeon: Tobi Bastos, MD;  [facility];  Service: Orthopedics;  Laterality: Right;  . Total knee arthroplasty Right 07/22/2014    Procedure: RIGHT TOTAL KNEE ARTHROPLASTY;  Surgeon: Elsie Saas, MD;  Location: [place];  Service: Orthopedics;  Laterality: Right;     Current Outpatient Prescriptions  Medication Sig Dispense  Refill  . atorvastatin (LIPITOR) 10 MG tablet Take 10 mg by mouth daily with breakfast.     . azelastine (ASTELIN) 137 MCG/SPRAY nasal spray Place 1 spray into the nose every morning. For congestion; Use in each nostril as directed    . cetirizine (ZYRTEC) 10 MG tablet Take 10 mg by mouth daily. As needed  Alternates monthly between this and Allegra Pt currently taking Allegra    . cholecalciferol (VITAMIN D) 1000 UNITS tablet Take 1,000 Units by mouth daily.    . clindamycin (CLEOCIN) 150 MG capsule Take 4 capsules by mouth as needed. (Take one (1) hour prior to dental procedures)  2  . EPINEPHrine (EPI-PEN) 0.3 mg/0.3 mL SOAJ Inject 0.3 mg into the muscle once.    . magnesium gluconate (MAGONATE) 500 MG tablet Take 500 mg by mouth 2 (two) times daily.     . metoprolol tartrate (LOPRESSOR) 25 MG tablet Take 1 tablet (25 mg total) by mouth 2 (two) times daily. 180 tablet 3  . Multiple Vitamin (MULTIVITAMIN WITH MINERALS) TABS Take 1 tablet by mouth daily.    . nitroGLYCERIN (NITROSTAT) 0.4 MG SL tablet Place 0.4 mg under the tongue every 5 (five) minutes as needed for chest pain.    Vladimir Faster Glycol-Propyl Glycol (SYSTANE) 0.4-0.3 % SOLN Place 1 drop into both eyes daily as needed (dry eyes).    . ranitidine (ZANTAC) 75 MG tablet Take 150 mg by mouth daily as needed for heartburn.     . rivaroxaban (XARELTO) 20 MG TABS tablet Take 1 tablet (20 mg total) by mouth daily. 7 tablet 0  . vitamin C (ASCORBIC ACID) 500 MG tablet Take 500 mg by mouth daily.      No current facility-administered medications for this visit.    Allergies:   Penicillins; Ambien; and Codeine    Social History:  The patient  reports that he quit smoking about 54 years ago. His smoking use included Cigarettes. He has a 2 pack-year smoking history. He has never used smokeless tobacco. He reports that he drinks about 0.6 oz of alcohol per week. He reports that he does not use illicit drugs.   Family History:  The  patient's family history includes Dementia in his mother; Healthy in his daughter; Heart disease in his father; Hypertension in his father; Skin cancer in his son; Thyroid disease in his brother and mother.    ROS:  Please see the history of present illness.   Otherwise, review of systems are positive for Lower extremity neuropathy, difficulty with balance, and leg pain..   All other systems are reviewed and negative.    PHYSICAL EXAM: VS:  BP 136/70 mmHg  Pulse 57  Ht 5\' 11"  (1.803 m)  Wt 220 lb 3.2 oz (99.882 kg)  BMI 30.73 kg/m2 , BMI Body mass index is 30.73  kg/(m^2). GEN: Well nourished, well developed, in no acute distress HEENT: normal Neck: no JVD, carotid bruits, or masses Cardiac: RRR.  There is no murmur, rub, or gallop. There is no edema. Respiratory:  clear to auscultation bilaterally, normal work of breathing. GI: soft, nontender, nondistended, + BS MS: no deformity or atrophy Skin: warm and dry, no rash Neuro:  Strength and sensation are intact Psych: euthymic mood, full affect   EKG:  EKG is ordered today. The ekg reveals NSR/SB and otherwise normal.   Recent Labs: No results found for requested labs within last 365 days.    Lipid Panel    Component Value Date/Time   CHOL 134 10/06/2013 0442   TRIG 73 10/06/2013 0442   HDL 43 10/06/2013 0442   CHOLHDL 3.1 10/06/2013 0442   VLDL 15 10/06/2013 0442   LDLCALC 76 10/06/2013 0442      Wt Readings from Last 3 Encounters:  07/24/15 220 lb 3.2 oz (99.882 kg)  07/30/14 222 lb 6.4 oz (100.88 kg)  07/22/14 213 lb (96.616 kg)      Other studies Reviewed: Additional studies/ records that were reviewed today include: none. The findings include none.    ASSESSMENT AND PLAN:  1. PAF (paroxysmal atrial fibrillation), 10/08/13 TEE and DCCV to SR Stable  2. Essential hypertension controlled  3. Coronary artery disease involving native coronary artery of native heart without angina pectoris Dyspnea on  exertion  4. Acute on chronic diastolic heart failure (HCC) No volume overload  5. Renal cell carcinoma, right ([place]) Metastatic disease has resolved on targeted therapy    Current medicines are reviewed at length with the patient today.  The patient has the following concerns regarding medicines: none.  The following changes/actions have been instituted:    Pharmacologic myoview  Labs/ tests ordered today include:  No orders of the defined types were placed in this encounter.     Disposition:   FU with HS in 1 year  Signed, Sinclair Grooms, MD  07/24/2015 11:56 AM    [facility] [address], [place], [place]  [address] Phone: [phone]; Fax: [phone]

## 2015-07-29 ENCOUNTER — Ambulatory Visit (HOSPITAL_COMMUNITY): Payer: Medicare PPO | Attending: Cardiology

## 2015-07-29 DIAGNOSIS — I480 Paroxysmal atrial fibrillation: Secondary | ICD-10-CM | POA: Diagnosis not present

## 2015-07-29 DIAGNOSIS — R079 Chest pain, unspecified: Secondary | ICD-10-CM | POA: Diagnosis not present

## 2015-07-29 DIAGNOSIS — R0609 Other forms of dyspnea: Secondary | ICD-10-CM | POA: Diagnosis not present

## 2015-07-29 DIAGNOSIS — I10 Essential (primary) hypertension: Secondary | ICD-10-CM | POA: Diagnosis not present

## 2015-07-29 DIAGNOSIS — R9439 Abnormal result of other cardiovascular function study: Secondary | ICD-10-CM | POA: Diagnosis not present

## 2015-07-29 DIAGNOSIS — I2510 Atherosclerotic heart disease of native coronary artery without angina pectoris: Secondary | ICD-10-CM

## 2015-07-29 LAB — MYOCARDIAL PERFUSION IMAGING
CHL CUP NUCLEAR SDS: 3
CHL CUP NUCLEAR SRS: 8
CHL CUP NUCLEAR SSS: 11
CHL CUP RESTING HR STRESS: 63 {beats}/min
LV dias vol: 109 mL (ref 62–150)
LV sys vol: 50 mL
Peak HR: 94 {beats}/min
RATE: 0.35
TID: 1.15

## 2015-07-29 MED ORDER — TECHNETIUM TC 99M SESTAMIBI GENERIC - CARDIOLITE
10.2000 | Freq: Once | INTRAVENOUS | Status: AC | PRN
  Administered 2015-07-29: 10 via INTRAVENOUS

## 2015-07-29 MED ORDER — TECHNETIUM TC 99M SESTAMIBI GENERIC - CARDIOLITE
32.4000 | Freq: Once | INTRAVENOUS | Status: AC | PRN
  Administered 2015-07-29: 32 via INTRAVENOUS

## 2015-07-29 MED ORDER — REGADENOSON 0.4 MG/5ML IV SOLN
0.4000 mg | Freq: Once | INTRAVENOUS | Status: AC
Start: 2015-07-29 — End: 2015-07-29
  Administered 2015-07-29: 0.4 mg via INTRAVENOUS

## 2015-08-04 ENCOUNTER — Telehealth: Payer: Self-pay | Admitting: Interventional Cardiology

## 2015-08-04 NOTE — Telephone Encounter (Signed)
-----   Message from Belva Crome, MD sent at 07/31/2015  7:02 PM EDT ----- This study shows the prior inferior wall heart attack. Overall pumping action is normal with EF 54%. This study is interpreted to be low to intermediate risk. The findings are perfectly compatible with prior history of inferior infarct. This study is reassuring.

## 2015-08-04 NOTE — Telephone Encounter (Signed)
DPR on file. Pt wife aware of myoview results below with verbal understanding.

## 2015-08-04 NOTE — Telephone Encounter (Signed)
New message ° ° ° ° ° °Returning a call to the nurse to get stress test results °

## 2015-10-08 ENCOUNTER — Other Ambulatory Visit: Payer: Self-pay | Admitting: Interventional Cardiology

## 2016-01-30 ENCOUNTER — Encounter: Payer: Self-pay | Admitting: Podiatry

## 2016-01-30 ENCOUNTER — Ambulatory Visit (INDEPENDENT_AMBULATORY_CARE_PROVIDER_SITE_OTHER): Payer: Medicare PPO | Admitting: Podiatry

## 2016-01-30 VITALS — BP 155/84 | HR 60 | Resp 16 | Ht 71.0 in | Wt 210.0 lb

## 2016-01-30 DIAGNOSIS — B078 Other viral warts: Secondary | ICD-10-CM

## 2016-01-30 DIAGNOSIS — B070 Plantar wart: Secondary | ICD-10-CM | POA: Diagnosis not present

## 2016-01-30 DIAGNOSIS — B079 Viral wart, unspecified: Secondary | ICD-10-CM

## 2016-01-30 NOTE — Progress Notes (Signed)
   Subjective:    Patient ID: Donald Gray, male    DOB: 02-05-1941, 75 y.o.   MRN: [id number]  HPI Chief Complaint  Patient presents with  . Painful lesion    Right foot; plantar forefoot-below 3rd toe; x2 weeks      Review of Systems  Musculoskeletal: Positive for back pain.  Neurological: Positive for numbness.  All other systems reviewed and are negative.      Objective:   Physical Exam        Assessment & Plan:

## 2016-01-30 NOTE — Progress Notes (Signed)
Subjective:     Patient ID: Donald Gray, male   DOB: 05/12/1940, 75 y.o.   MRN: [id number]  HPI patient presents stating he's developed a lesion on the bottom of his left foot that's very tender and he has tried to trim it himself   Review of Systems  All other systems reviewed and are negative.      Objective:   Physical Exam  Constitutional: He is oriented to person, place, and time.  Cardiovascular: Intact distal pulses.   Musculoskeletal: Normal range of motion.  Neurological: He is oriented to person, place, and time.  Skin: Skin is warm.  Nursing note and vitals reviewed.  neurovascular status intact muscle strength adequate patient found to have lesion plantar aspect foot that upon debridement shows pinpoint bleeding with pain to lateral pressure. Patient is going to [facility] for 6 weeks and needs treatment today to try to get him on the trip     Assessment:     Verruca plantaris plantar foot    Plan:     H&P conditions reviewed D debridement accomplished and applied chemical agent to create an immune response along with padding and sterile dressing. Reappoint to recheck and around 8 weeks

## 2016-03-19 ENCOUNTER — Ambulatory Visit (INDEPENDENT_AMBULATORY_CARE_PROVIDER_SITE_OTHER): Payer: Medicare PPO | Admitting: Podiatry

## 2016-03-19 ENCOUNTER — Encounter: Payer: Self-pay | Admitting: Podiatry

## 2016-03-19 DIAGNOSIS — S90851D Superficial foreign body, right foot, subsequent encounter: Secondary | ICD-10-CM

## 2016-03-19 DIAGNOSIS — M779 Enthesopathy, unspecified: Secondary | ICD-10-CM | POA: Diagnosis not present

## 2016-03-19 NOTE — Progress Notes (Signed)
Subjective:     Patient ID: Donald Gray, male   DOB: [birth year], 75 y.o.   MRN: [id number]  HPI patient presents with a small lesion right and states he got a small piece of metal out of it and it's been feeling better since   Review of Systems     Objective:   Physical Exam neurovascular status intact with small area keratotic tissue sub-fourth metatarsal right localized in nature with minimal discomfort now currently and no pinpoint bleeding noted upon debridement     Assessment:     Possibility for verruca versus foreign body right     Plan:     Debrided the tissue did not note further abnormal tissue and advised on gradual return to normal activity and this should heal uneventfully

## 2016-04-09 ENCOUNTER — Ambulatory Visit (INDEPENDENT_AMBULATORY_CARE_PROVIDER_SITE_OTHER): Payer: Medicare PPO | Admitting: Podiatry

## 2016-04-09 DIAGNOSIS — M779 Enthesopathy, unspecified: Secondary | ICD-10-CM

## 2016-04-09 NOTE — Patient Instructions (Signed)

## 2016-04-11 NOTE — Progress Notes (Signed)
Subjective:     Patient ID: Donald Gray, male   DOB: 11/24/1940, 75 y.o.   MRN: [id number]  HPI patient presents for orthotics   Review of Systems     Objective:   Physical Exam Neurovascular status intact with pain still noted but improved    Assessment:     Tendinitis    Plan:     Orthotics dispensed with all instructions

## 2016-05-04 DIAGNOSIS — N183 Chronic kidney disease, stage 3 (moderate): Secondary | ICD-10-CM | POA: Diagnosis not present

## 2016-05-04 DIAGNOSIS — E1142 Type 2 diabetes mellitus with diabetic polyneuropathy: Secondary | ICD-10-CM | POA: Diagnosis not present

## 2016-05-04 DIAGNOSIS — I10 Essential (primary) hypertension: Secondary | ICD-10-CM | POA: Diagnosis not present

## 2016-05-04 DIAGNOSIS — Z85528 Personal history of other malignant neoplasm of kidney: Secondary | ICD-10-CM | POA: Diagnosis not present

## 2016-05-04 DIAGNOSIS — Z0000 Encounter for general adult medical examination without abnormal findings: Secondary | ICD-10-CM | POA: Diagnosis not present

## 2016-05-04 DIAGNOSIS — E7800 Pure hypercholesterolemia, unspecified: Secondary | ICD-10-CM | POA: Diagnosis not present

## 2016-05-04 DIAGNOSIS — I480 Paroxysmal atrial fibrillation: Secondary | ICD-10-CM | POA: Diagnosis not present

## 2016-05-04 DIAGNOSIS — Z1389 Encounter for screening for other disorder: Secondary | ICD-10-CM | POA: Diagnosis not present

## 2016-05-07 DIAGNOSIS — D485 Neoplasm of uncertain behavior of skin: Secondary | ICD-10-CM | POA: Diagnosis not present

## 2016-05-07 DIAGNOSIS — D2239 Melanocytic nevi of other parts of face: Secondary | ICD-10-CM | POA: Diagnosis not present

## 2016-05-07 DIAGNOSIS — D2261 Melanocytic nevi of right upper limb, including shoulder: Secondary | ICD-10-CM | POA: Diagnosis not present

## 2016-05-07 DIAGNOSIS — L821 Other seborrheic keratosis: Secondary | ICD-10-CM | POA: Diagnosis not present

## 2016-05-07 DIAGNOSIS — L820 Inflamed seborrheic keratosis: Secondary | ICD-10-CM | POA: Diagnosis not present

## 2016-05-07 DIAGNOSIS — D1801 Hemangioma of skin and subcutaneous tissue: Secondary | ICD-10-CM | POA: Diagnosis not present

## 2016-05-07 DIAGNOSIS — D225 Melanocytic nevi of trunk: Secondary | ICD-10-CM | POA: Diagnosis not present

## 2016-05-07 DIAGNOSIS — D492 Neoplasm of unspecified behavior of bone, soft tissue, and skin: Secondary | ICD-10-CM | POA: Diagnosis not present

## 2016-07-14 ENCOUNTER — Encounter: Payer: Self-pay | Admitting: Interventional Cardiology

## 2016-07-23 ENCOUNTER — Encounter: Payer: Self-pay | Admitting: Interventional Cardiology

## 2016-07-23 ENCOUNTER — Ambulatory Visit (INDEPENDENT_AMBULATORY_CARE_PROVIDER_SITE_OTHER): Payer: Medicare HMO | Admitting: Interventional Cardiology

## 2016-07-23 VITALS — BP 124/72 | HR 63 | Ht 71.0 in | Wt 216.0 lb

## 2016-07-23 DIAGNOSIS — I10 Essential (primary) hypertension: Secondary | ICD-10-CM | POA: Diagnosis not present

## 2016-07-23 DIAGNOSIS — I25119 Atherosclerotic heart disease of native coronary artery with unspecified angina pectoris: Secondary | ICD-10-CM

## 2016-07-23 DIAGNOSIS — E784 Other hyperlipidemia: Secondary | ICD-10-CM

## 2016-07-23 DIAGNOSIS — I5032 Chronic diastolic (congestive) heart failure: Secondary | ICD-10-CM

## 2016-07-23 DIAGNOSIS — I480 Paroxysmal atrial fibrillation: Secondary | ICD-10-CM | POA: Diagnosis not present

## 2016-07-23 DIAGNOSIS — E7849 Other hyperlipidemia: Secondary | ICD-10-CM

## 2016-07-23 DIAGNOSIS — I252 Old myocardial infarction: Secondary | ICD-10-CM | POA: Diagnosis not present

## 2016-07-23 NOTE — Progress Notes (Signed)
Cardiology Office Note    Date:  07/23/2016   ID:  Donald Gray, DOB [birth year], MRN [id number]  PCP:  Donald Shelling, MD  Cardiologist: Donald Grooms, MD   Chief Complaint  Patient presents with  . Atrial Fibrillation    History of Present Illness:  Donald Gray is a 76 y.o. male  male CAD with remote stent in RCA1995, obesity, diet controlled DM, HLD, HTN, renal carcinoma treated with targeted chemotherapy, and PAFwiith CHADSVASC  of 4.   There are no cardiac complaints. No nitroglycerin use. He denies exertional dyspnea, orthopnea, and peripheral edema. No prolonged palpitations. No bleeding complications on Xarelto.   Past Medical History:  Diagnosis Date  . Anemia   . Anticoagulated- to go to Xarelto for a fib 10/07/2013  . Arthritis    "fingers, toes" (10/05/2013)  . Coronary artery disease    a. s/p MI with stent in 2000  . Diverticulosis   . Dysrhythmia   . GERD (gastroesophageal reflux disease)   . History of blood transfusion    "w/nephrectomy; w/shoulder replacement; w/knee replacement"  . Hyperlipidemia   . Hypertension   . Left knee DJD   . Myocardial infarction 1997  . Neuropathy (HCC)    feet bilat   . NSTEMI (non-ST elevated myocardial infarction), type 2 due to demand ischemia 10/07/2013  . PAF (paroxysmal atrial fibrillation) ([facility])   . Pneumonia ~ 2007   again 3 months ago   . Primary localized osteoarthritis of right knee   . Renal cell carcinoma ([facility])    a. s/p nephrectomy  . Shortness of breath    a. related to chemo drugs  . Tendonitis    currently on prednisone and wearing brace (left wrist)  . Type II diabetes mellitus ([facility])    "treating w/diet and weight loss"    Past Surgical History:  Procedure Laterality Date  . CARDIAC CATHETERIZATION  05/2009  . CARDIOVERSION N/A 10/08/2013   Procedure: CARDIOVERSION;  Surgeon: Dorothy Spark, MD;  Location: [place];  Service: Cardiovascular;  Laterality: N/A;  .  CHEILECTOMY Right 04/13/2013   Procedure: Right Great Toe Cheilectomy, Debride Tendon and Abscess, Apply Stimulan Beads;  Surgeon: Newt Minion, MD;  Location: [place];  Service: Orthopedics;  Laterality: Right;  Right Great Toe Cheilectomy, Debride Tendon and Abscess, Apply Stimulan Beads  . colonscopy     . [place]   at [facility] to RCA   . INGUINAL HERNIA REPAIR  09/22/2011   Procedure: HERNIA REPAIR INGUINAL ADULT BILATERAL;  Surgeon: Imogene Burn. Georgette Dover, MD;  [facility];  Service: General;  Laterality: Bilateral;  . INGUINAL HERNIA REPAIR Bilateral 09/22/11  . JOINT REPLACEMENT    . KNEE ARTHROSCOPY Right ~ 2011  . KNEE ARTHROSCOPY Left 11/13/2012   Procedure: ARTHROSCOPY KNEE;  Surgeon: Lorn Junes, MD;  Location: [place];  Service: Orthopedics;  Laterality: Left;  . LUNG BIOPSY  2011  . MASS EXCISION Right 04/12/2014   Procedure: MASS EXCISION DORSUM OF RIGHT FOOT  ;  Surgeon: Tobi Bastos, MD;  [facility];  Service: Orthopedics;  Laterality: Right;  . NEPHRECTOMY Right 08/14/2009  . ORCHIECTOMY  09/22/2011   Procedure: ORCHIECTOMY;  Surgeon: Franchot Gallo, MD;  [facility];  Service: Urology;  Laterality: Left;  Left Testicular Exploration, Inguinal Approach,  Testicular   . PILONIDAL CYST DRAINAGE    . TEE WITHOUT CARDIOVERSION N/A 10/08/2013   Procedure: TRANSESOPHAGEAL  ECHOCARDIOGRAM (TEE);  Surgeon: Dorothy Spark, MD;  Location: [facility];  Service: Cardiovascular;  Laterality: N/A;  . [place]  . TOTAL KNEE ARTHROPLASTY Left 11/13/2012   Procedure: TOTAL KNEE ARTHROPLASTY;  Surgeon: Lorn Junes, MD;  Location: [facility];  Service: Orthopedics;  Laterality: Left;  . TOTAL KNEE ARTHROPLASTY Right 07/22/2014   Procedure: RIGHT TOTAL KNEE ARTHROPLASTY;  Surgeon: Elsie Saas, MD;  Location: [facility];  Service: Orthopedics;  Laterality: Right;  . TOTAL SHOULDER REPLACEMENT Right 1997    Current  Medications: Outpatient Medications Prior to Visit  Medication Sig Dispense Refill  . atorvastatin (LIPITOR) 10 MG tablet Take 10 mg by mouth daily with breakfast.     . azelastine (ASTELIN) 137 MCG/SPRAY nasal spray Place 1 spray into the nose every morning. For congestion; Use in each nostril as directed    . cetirizine (ZYRTEC) 10 MG tablet Take 10 mg by mouth daily. As needed  Alternates monthly between this and Allegra Pt currently taking Allegra    . cholecalciferol (VITAMIN D) 1000 UNITS tablet Take 1,000 Units by mouth daily.    . clindamycin (CLEOCIN) 150 MG capsule Take 4 capsules by mouth as needed. (Take one (1) hour prior to dental procedures)  2  . EPINEPHrine (EPI-PEN) 0.3 mg/0.3 mL SOAJ Inject 0.3 mg into the muscle once.    . magnesium gluconate (MAGONATE) 500 MG tablet Take 500 mg by mouth 2 (two) times daily.     . metoprolol tartrate (LOPRESSOR) 25 MG tablet Take 1 tablet (25 mg total) by mouth 2 (two) times daily. 180 tablet 3  . Multiple Vitamin (MULTIVITAMIN WITH MINERALS) TABS Take 1 tablet by mouth daily.    . nitroGLYCERIN (NITROSTAT) 0.4 MG SL tablet Place 0.4 mg under the tongue every 5 (five) minutes as needed for chest pain.    Vladimir Faster Glycol-Propyl Glycol (SYSTANE) 0.4-0.3 % SOLN Place 1 drop into both eyes daily as needed (dry eyes).    . ranitidine (ZANTAC) 75 MG tablet Take 150 mg by mouth daily as needed for heartburn.     . rivaroxaban (XARELTO) 20 MG TABS tablet Take 1 tablet (20 mg total) by mouth daily. 90 tablet 3  . vitamin C (ASCORBIC ACID) 500 MG tablet Take 500 mg by mouth daily.      No facility-administered medications prior to visit.      Allergies:   Penicillins; Ambien [zolpidem tartrate]; and Codeine   Social History   Social History  . Marital status: Married    Spouse name: N/A  . Number of children: N/A  . Years of education: N/A   Social History Main Topics  . Smoking status: Former Smoker    Packs/day: 1.00    Years: 2.00      Types: Cigarettes    Quit date: 05/03/1961  . Smokeless tobacco: Never Used  . Alcohol use 0.6 oz/week    1 Glasses of wine per week     Comment: occas glass of wine every 2 wks  . Drug use: No  . Sexual activity: Not Currently   Other Topics Concern  . None   Social History Narrative   Retired English as a second language teacher.   Lives with wife.     Family History:  The patient's family history includes Dementia in his mother; Healthy in his daughter; Heart disease in his father; Hypertension in his father; Skin cancer in his son; Thyroid disease in his brother and mother.   ROS:   Please  see the history of present illness.    Neuropathy in both feet from chemotherapy. Stiffness and pain in both hands from arthritis. Occasional elevated blood pressure prior to a.m. medication  All other systems reviewed and are negative.   PHYSICAL EXAM:   VS:  BP 124/72 (BP Location: Left Arm)   Pulse 63   Ht 5\' 11"  (1.803 m)   Wt 216 lb (98 kg)   BMI 30.13 kg/m    GEN: Well nourished, well developed, in no acute distress  HEENT: normal  Neck: no JVD, carotid bruits, or masses Cardiac: RRR; no murmurs, rubs, or gallops,no edema  Respiratory:  clear to auscultation bilaterally, normal work of breathing GI: soft, nontender, nondistended, + BS MS: no deformity or atrophy  Skin: warm and dry, no rash Neuro:  Alert and Oriented x 3, Strength and sensation are intact Psych: euthymic mood, full affect  Wt Readings from Last 3 Encounters:  07/23/16 216 lb (98 kg)  01/30/16 210 lb (95.3 kg)  07/24/15 220 lb 3.2 oz (99.9 kg)      Studies/Labs Reviewed:   EKG:  EKG  Normal sinus rhythm, left atrial abnormality, essentially normal tracing.  Recent Labs: No results found for requested labs within last 8760 hours.   Lipid Panel    Component Value Date/Time   CHOL 134 10/06/2013 0442   TRIG 73 10/06/2013 0442   HDL 43 10/06/2013 0442   CHOLHDL 3.1 10/06/2013 0442   VLDL 15 10/06/2013 0442   LDLCALC 76  10/06/2013 0442    Additional studies/ records that were reviewed today include:  None  Cardiac catheterization 2011: Unable to find the official result. Findings were mild enough to recommend continued medical therapy.  ASSESSMENT:    1. Chronic diastolic heart failure ([place])   2. Coronary artery disease involving native coronary artery of native heart with angina pectoris (HCC)   3. Paroxysmal atrial fibrillation ([place])   4. Essential hypertension   5. Old MI (myocardial infarction)   6. Other hyperlipidemia      PLAN:  In order of problems listed above:  1. No evidence of volume overload. He is encouraged to call of lower extremity swelling or dyspnea. 2. Asymptomatic and really with essentially quiesced sent coronary disease for the last 20 years. Prior history of intervention on the right coronary during acute intervention [facility] at [facility]. 3. Continue anticoagulation therapy. No symptomatic recurrences of tachycardia. 4. Blood pressure is under reasonable control. No change in therapy at this time.  Remain physically active. Notify us of dyspnea, swelling, or chest pain. Plan clinical follow-up in one year with EKG.    Medication Adjustments/Labs and Tests Ordered: Current medicines are reviewed at length with the patient today.  Concerns regarding medicines are outlined above.  Medication changes, Labs and Tests ordered today are listed in the Patient Instructions below. Patient Instructions  Medication Instructions:  None  Labwork: None  Testing/Procedures: None  Follow-Up: Your physician wants you to follow-up in: 1 year with Dr. Tamala Julian.  You will receive a reminder letter in the mail two months in advance. If you don't receive a letter, please call our office to schedule the follow-up appointment.   Any Other Special Instructions Will Be Listed Below (If Applicable).     If you need a refill on your cardiac medications before your next  appointment, please call your pharmacy.      Signed, Donald Grooms, MD  07/23/2016 11:50 AM  [facility] [place], [place], [place]  [address] Phone: [phone]; Fax: [phone]

## 2016-07-23 NOTE — Patient Instructions (Signed)

## 2016-08-05 DIAGNOSIS — R69 Illness, unspecified: Secondary | ICD-10-CM | POA: Diagnosis not present

## 2016-11-09 DIAGNOSIS — E1142 Type 2 diabetes mellitus with diabetic polyneuropathy: Secondary | ICD-10-CM | POA: Diagnosis not present

## 2016-11-09 DIAGNOSIS — I10 Essential (primary) hypertension: Secondary | ICD-10-CM | POA: Diagnosis not present

## 2016-11-09 DIAGNOSIS — N183 Chronic kidney disease, stage 3 (moderate): Secondary | ICD-10-CM | POA: Diagnosis not present

## 2016-12-15 DIAGNOSIS — R69 Illness, unspecified: Secondary | ICD-10-CM | POA: Diagnosis not present

## 2016-12-30 DIAGNOSIS — Z96653 Presence of artificial knee joint, bilateral: Secondary | ICD-10-CM | POA: Diagnosis not present

## 2017-02-01 DIAGNOSIS — E119 Type 2 diabetes mellitus without complications: Secondary | ICD-10-CM | POA: Diagnosis not present

## 2017-02-01 DIAGNOSIS — R69 Illness, unspecified: Secondary | ICD-10-CM | POA: Diagnosis not present

## 2017-02-01 DIAGNOSIS — H2513 Age-related nuclear cataract, bilateral: Secondary | ICD-10-CM | POA: Diagnosis not present

## 2017-02-08 DIAGNOSIS — R69 Illness, unspecified: Secondary | ICD-10-CM | POA: Diagnosis not present

## 2017-02-22 DIAGNOSIS — I10 Essential (primary) hypertension: Secondary | ICD-10-CM | POA: Diagnosis not present

## 2017-02-22 DIAGNOSIS — E7800 Pure hypercholesterolemia, unspecified: Secondary | ICD-10-CM | POA: Diagnosis not present

## 2017-02-22 DIAGNOSIS — R7303 Prediabetes: Secondary | ICD-10-CM | POA: Diagnosis not present

## 2017-03-02 DIAGNOSIS — I10 Essential (primary) hypertension: Secondary | ICD-10-CM | POA: Diagnosis not present

## 2017-03-11 DIAGNOSIS — I10 Essential (primary) hypertension: Secondary | ICD-10-CM | POA: Diagnosis not present

## 2017-04-08 DIAGNOSIS — I10 Essential (primary) hypertension: Secondary | ICD-10-CM | POA: Diagnosis not present

## 2017-04-13 ENCOUNTER — Encounter: Payer: Self-pay | Admitting: Cardiology

## 2017-04-18 NOTE — Progress Notes (Signed)
Cardiology Office Note   Date:  04/19/2017   ID:  Donald Gray, DOB [birth year], MRN [id number]  PCP:  Donald Orn, MD  Cardiologist:  Dr. Tamala Gray    Chief Complaint  Patient presents with  . Hypertension      History of Present Illness: Donald Gray is a 76 y.o. male who [place].    CAD with remote stent in RCA1995, obesity, diet controlled DM, HLD, HTN, renal carcinoma treated with targeted chemotherapy, and PAF wiith CHADSVASC  of 4. On xarelto.   Pt relates he was having leg cramps and BP taken by his wife was low, but by next AM was elevated.  106 systolic.  Was seen at Urgent care at [facility].  He was given clonidine and BP immediately came down. Too low.  On lisinopril he developed a cough.  Now he is on micardis  80 and BP controlled.  He has no chest pain and no SOB.  He has not had any atrial fib or symptoms of atrial fib, he did not know he was in it until he became SOB.  He has no bleeding.  To go to [place] soon for eval with renal caner.   Last lipids LDL at 122 I am adding Zetia to his meds.    Past Medical History:  Diagnosis Date  . Anemia   . Anticoagulated- to go to Xarelto for a fib 10/07/2013  . Arthritis    "fingers, toes" (10/05/2013)  . Coronary artery disease    a. s/p MI with stent in 2000  . Diverticulosis   . Dysrhythmia   . GERD (gastroesophageal reflux disease)   . History of blood transfusion    "w/nephrectomy; w/shoulder replacement; w/knee replacement"  . Hyperlipidemia   . Hypertension   . Left knee DJD   . Myocardial infarction (Donald Gray) 1997  . Neuropathy    feet bilat   . NSTEMI (non-ST elevated myocardial infarction), type 2 due to demand ischemia 10/07/2013  . PAF (paroxysmal atrial fibrillation) ([place])   . Pneumonia ~ 2007   again 3 months ago   . Primary localized osteoarthritis of right knee   . Renal cell carcinoma ([place])    a. s/p nephrectomy  . Shortness of breath    a. related to  chemo drugs  . Tendonitis    currently on prednisone and wearing brace (left wrist)  . Type II diabetes mellitus ([place])    "treating w/diet and weight loss"    Past Surgical History:  Procedure Laterality Date  . CARDIAC CATHETERIZATION  05/2009  . CARDIOVERSION N/A 10/08/2013   Procedure: CARDIOVERSION;  Surgeon: Dorothy Spark, MD;  Location: [place];  Service: Cardiovascular;  Laterality: N/A;  . CHEILECTOMY Right 04/13/2013   Procedure: Right Great Toe Cheilectomy, Debride Tendon and Abscess, Apply Stimulan Beads;  Surgeon: Newt Minion, MD;  Location: [place];  Service: Orthopedics;  Laterality: Right;  Right Great Toe Cheilectomy, Debride Tendon and Abscess, Apply Stimulan Beads  . colonscopy     . [place]   at [facility] to RCA   . INGUINAL HERNIA REPAIR  09/22/2011   Procedure: HERNIA REPAIR INGUINAL ADULT BILATERAL;  Surgeon: Imogene Burn. Georgette Dover, MD;  [facility];  Service: General;  Laterality: Bilateral;  . INGUINAL HERNIA REPAIR Bilateral 09/22/11  . JOINT REPLACEMENT    . KNEE ARTHROSCOPY Right ~ 2011  . KNEE ARTHROSCOPY Left 11/13/2012   Procedure:  ARTHROSCOPY KNEE;  Surgeon: Lorn Junes, MD;  Location: [place];  Service: Orthopedics;  Laterality: Left;  . LUNG BIOPSY  2011  . MASS EXCISION Right 04/12/2014   Procedure: MASS EXCISION DORSUM OF RIGHT FOOT  ;  Surgeon: Tobi Bastos, MD;  [facility];  Service: Orthopedics;  Laterality: Right;  . NEPHRECTOMY Right 08/14/2009  . ORCHIECTOMY  09/22/2011   Procedure: ORCHIECTOMY;  Surgeon: Franchot Gallo, MD;  [facility];  Service: Urology;  Laterality: Left;  Left Testicular Exploration, Inguinal Approach,  Testicular   . PILONIDAL CYST DRAINAGE    . TEE WITHOUT CARDIOVERSION N/A 10/08/2013   Procedure: TRANSESOPHAGEAL ECHOCARDIOGRAM (TEE);  Surgeon: Dorothy Spark, MD;  Location: [place];  Service: Cardiovascular;  Laterality: N/A;  . Donald Gray  . TOTAL KNEE ARTHROPLASTY Left 11/13/2012   Procedure: TOTAL KNEE ARTHROPLASTY;  Surgeon: Lorn Junes, MD;  Location: [place];  Service: Orthopedics;  Laterality: Left;  . TOTAL KNEE ARTHROPLASTY Right 07/22/2014   Procedure: RIGHT TOTAL KNEE ARTHROPLASTY;  Surgeon: Elsie Saas, MD;  Location: [place];  Service: Orthopedics;  Laterality: Right;  . TOTAL SHOULDER REPLACEMENT Right 1997     Current Outpatient Medications  Medication Sig Dispense Refill  . atorvastatin (LIPITOR) 10 MG tablet Take 10 mg by mouth daily with breakfast.     . azelastine (ASTELIN) 137 MCG/SPRAY nasal spray Place 1 spray into the nose every morning. For congestion; Use in each nostril as directed    . cetirizine (ZYRTEC) 10 MG tablet Take 10 mg by mouth daily. As needed  Alternates monthly between this and Allegra Pt currently taking Allegra    . cholecalciferol (VITAMIN D) 1000 UNITS tablet Take 1,000 Units by mouth daily.    . clindamycin (CLEOCIN) 150 MG capsule Take 4 capsules by mouth as needed. (Take one (1) hour prior to dental procedures)  2  . EPINEPHrine (EPI-PEN) 0.3 mg/0.3 mL SOAJ Inject 0.3 mg into the muscle once.    . magnesium gluconate (MAGONATE) 500 MG tablet Take 500 mg by mouth 2 (two) times daily.     . metoprolol tartrate (LOPRESSOR) 25 MG tablet Take 1 tablet (25 mg total) by mouth 2 (two) times daily. 180 tablet 3  . Multiple Vitamin (MULTIVITAMIN WITH MINERALS) TABS Take 1 tablet by mouth daily.    . nitroGLYCERIN (NITROSTAT) 0.4 MG SL tablet Place 0.4 mg under the tongue every 5 (five) minutes as needed for chest pain.    Vladimir Faster Glycol-Propyl Glycol (SYSTANE) 0.4-0.3 % SOLN Place 1 drop into both eyes daily as needed (dry eyes).    . ranitidine (ZANTAC) 75 MG tablet Take 150 mg by mouth daily as needed for heartburn.     . rivaroxaban (XARELTO) 20 MG TABS tablet Take 1 tablet (20 mg total) by mouth daily. 90 tablet 3  . telmisartan (MICARDIS) 80 MG tablet Take 80  mg by mouth every evening.  11  . vitamin C (ASCORBIC ACID) 500 MG tablet Take 500 mg by mouth daily.     Marland Kitchen ezetimibe (ZETIA) 10 MG tablet Take 1 tablet (10 mg total) by mouth daily. 30 tablet 0   No current facility-administered medications for this visit.     Allergies:   Penicillins; Ambien [zolpidem tartrate]; Codeine; and Lisinopril    Social History:  The patient  reports that he quit smoking about 56 years ago. His smoking use included cigarettes. He has a 2.00 pack-year smoking history. he has  never used smokeless tobacco. He reports that he drinks about 0.6 oz of alcohol per week. He reports that he does not use drugs.   Family History:  The patient's family history includes Dementia in his mother; Healthy in his daughter; Heart disease in his father; Hypertension in his father; Skin cancer in his son; Thyroid disease in his brother and mother.    ROS:  General:no colds or fevers, no weight changes Skin:no rashes or ulcers HEENT:no blurred vision, no congestion CV:see HPI PUL:see HPI GI:no diarrhea constipation or melena, no indigestion GU:no hematuria, no dysuria MS:no joint pain, no claudication Neuro:no syncope, no lightheadedness Endo:no diabetes, no thyroid disease  Wt Readings from Last 3 Encounters:  04/19/17 212 lb 12.8 oz (96.5 kg)  07/23/16 216 lb (98 kg)  01/30/16 210 lb (95.3 kg)     PHYSICAL EXAM: VS:  BP 120/62   Pulse 63   Resp 16   Ht 5\' 11"  (1.803 m)   Wt 212 lb 12.8 oz (96.5 kg)   SpO2 98%   BMI 29.68 kg/m  , BMI Body mass index is 29.68 kg/m. General:Pleasant affect, NAD Skin:Warm and dry, brisk capillary refill HEENT:normocephalic, sclera clear, mucus membranes moist Neck:supple, no JVD, no bruits  Heart:S1S2 RRR without murmur, gallup, rub or click Lungs:clear without rales, rhonchi, or wheezes RSW:NIOE, non tender, + BS, do not palpate liver spleen or masses Ext:no lower ext edema, 2+ pedal pulses, 2+ radial pulses Neuro:alert and  oriented X 3, MAE, follows commands, + facial symmetry    EKG:  EKG is ordered today. The ekg ordered today demonstrates SR normal EKG   Recent Labs: No results found for requested labs within last 8760 hours.    Lipid Panel    Component Value Date/Time   CHOL 134 10/06/2013 0442   TRIG 73 10/06/2013 0442   HDL 43 10/06/2013 0442   CHOLHDL 3.1 10/06/2013 0442   VLDL 15 10/06/2013 0442   LDLCALC 76 10/06/2013 0442       Other studies Reviewed: Additional studies/ records that were reviewed today include:  Last nuc study 07/29/15. Study Highlights   Addendum by Skeet Latch, MD on Tue Jul 29, 2015 6:41 PM   Nuclear stress EF: 54%.  There was no ST segment deviation noted during stress.  Defect 1: There is a medium defect of severe severity present in the basal inferior, mid inferior, apical inferior and apex location.  Findings consistent with prior myocardial infarction with a small amount of peri-infarct ischemia.  This is a low risk study.  The left ventricular ejection fraction is mildly decreased (45-54%).    Finalized by Skeet Latch, MD on Tue Jul 29, 2015 6:40 PM   Nuclear stress EF: 54%.  There was no ST segment deviation noted during stress.  Defect 1: There is a medium defect of severe severity present in the basal inferior, mid inferior, apical inferior and apex location.  Findings consistent with prior myocardial infarction with a small amount of peri-infarct ischemia.  This is an intermediate risk study.  The left ventricular ejection fraction is mildly decreased (45-54%).      TEE 10/08/13  Study Conclusions  - Left ventricle: Systolic function was normal. The estimated ejection fraction was in the range of 50% to 55%. Wall motion was normal; there were no regional wall motion abnormalities. - Mitral valve: At least three small jets of mitral regurgitation. There was moderate regurgitation. - Left atrium: The atrium was  dilated. No evidence of thrombus  in the atrial cavity or appendage. No evidence of thrombus in the atrial cavity or appendage. No evidence of thrombus in the appendage. - Right atrium: No evidence of thrombus in the atrial cavity or appendage. No evidence of thrombus in the atrial cavity or appendage. - Tricuspid valve: No evidence of vegetation. - Pulmonic valve: No evidence of vegetation. - Impressions: No intracardiac source of embolism was identified. TEE was followed by a successful cardioversion.  Impressions:  - No intracardiac source of embolism was identified. TEE was followed by a successful cardioversion.  ASSESSMENT AND PLAN:  1.  PAF maintaining SR on xarelto.  2  HTN stable now but has recently been labile , if labile again would consider renal ulrasound of remaining kidney though he is to have CTA soon.  Continue current meds  3  Chronic diastolic HF, euvlolemic today  4.  CAD with no angina.  Does have hx old MI  Follow up with Dr. Tamala Gray in 6 months  Current medicines are reviewed with the patient today.  The patient Has no concerns regarding medicines.  The following changes have been made:  See above Labs/ tests ordered today include:see above  Disposition:   FU:  see above  Signed, Cecilie Kicks, NP  04/19/2017 6:21 PM    [facility], [place], [place], [place] Phone: [phone]; Fax: [phone]

## 2017-04-19 ENCOUNTER — Encounter: Payer: Self-pay | Admitting: Cardiology

## 2017-04-19 ENCOUNTER — Ambulatory Visit: Payer: Medicare HMO | Admitting: Cardiology

## 2017-04-19 VITALS — BP 120/62 | HR 63 | Resp 16 | Ht 71.0 in | Wt 212.8 lb

## 2017-04-19 DIAGNOSIS — E7849 Other hyperlipidemia: Secondary | ICD-10-CM

## 2017-04-19 DIAGNOSIS — I5032 Chronic diastolic (congestive) heart failure: Secondary | ICD-10-CM

## 2017-04-19 DIAGNOSIS — I10 Essential (primary) hypertension: Secondary | ICD-10-CM

## 2017-04-19 DIAGNOSIS — I252 Old myocardial infarction: Secondary | ICD-10-CM

## 2017-04-19 DIAGNOSIS — I480 Paroxysmal atrial fibrillation: Secondary | ICD-10-CM

## 2017-04-19 DIAGNOSIS — I2510 Atherosclerotic heart disease of native coronary artery without angina pectoris: Secondary | ICD-10-CM | POA: Diagnosis not present

## 2017-04-19 MED ORDER — EZETIMIBE 10 MG PO TABS: 10.0000 mg | ORAL_TABLET | Freq: Every day | ORAL | 0 refills | Status: DC

## 2017-04-19 NOTE — Patient Instructions (Addendum)
Medication Instructions: Your physician has recommended you make the following change in your medication:  -1) START Zetia (10 mg) - Take 1 tablet (10 mg) by mouth daily   Labwork: Your physician recommends that you return for lab work in 6-8 weeks - FASTING Lipid and Hepatic Function Panel  Procedures/Testing: None Ordered  Follow-Up: Your physician recommends that you schedule a follow-up appointment in: 3-4 MONTHS with Dr. Tamala Julian   If you need a refill on your cardiac medications before your next appointment, please call your pharmacy.

## 2017-05-06 DIAGNOSIS — I480 Paroxysmal atrial fibrillation: Secondary | ICD-10-CM | POA: Diagnosis not present

## 2017-05-06 DIAGNOSIS — I2510 Atherosclerotic heart disease of native coronary artery without angina pectoris: Secondary | ICD-10-CM | POA: Diagnosis not present

## 2017-05-06 DIAGNOSIS — N183 Chronic kidney disease, stage 3 (moderate): Secondary | ICD-10-CM | POA: Diagnosis not present

## 2017-05-06 DIAGNOSIS — Z0000 Encounter for general adult medical examination without abnormal findings: Secondary | ICD-10-CM | POA: Diagnosis not present

## 2017-05-06 DIAGNOSIS — E7800 Pure hypercholesterolemia, unspecified: Secondary | ICD-10-CM | POA: Diagnosis not present

## 2017-05-06 DIAGNOSIS — Z1389 Encounter for screening for other disorder: Secondary | ICD-10-CM | POA: Diagnosis not present

## 2017-05-06 DIAGNOSIS — Z85528 Personal history of other malignant neoplasm of kidney: Secondary | ICD-10-CM | POA: Diagnosis not present

## 2017-05-06 DIAGNOSIS — E1142 Type 2 diabetes mellitus with diabetic polyneuropathy: Secondary | ICD-10-CM | POA: Diagnosis not present

## 2017-05-06 DIAGNOSIS — I10 Essential (primary) hypertension: Secondary | ICD-10-CM | POA: Diagnosis not present

## 2017-05-11 DIAGNOSIS — J8410 Pulmonary fibrosis, unspecified: Secondary | ICD-10-CM | POA: Diagnosis not present

## 2017-05-11 DIAGNOSIS — Z905 Acquired absence of kidney: Secondary | ICD-10-CM | POA: Diagnosis not present

## 2017-05-11 DIAGNOSIS — K760 Fatty (change of) liver, not elsewhere classified: Secondary | ICD-10-CM | POA: Diagnosis not present

## 2017-05-11 DIAGNOSIS — Z8507 Personal history of malignant neoplasm of pancreas: Secondary | ICD-10-CM | POA: Diagnosis not present

## 2017-05-11 DIAGNOSIS — Z79899 Other long term (current) drug therapy: Secondary | ICD-10-CM | POA: Diagnosis not present

## 2017-05-11 DIAGNOSIS — C641 Malignant neoplasm of right kidney, except renal pelvis: Secondary | ICD-10-CM | POA: Diagnosis not present

## 2017-05-12 DIAGNOSIS — L720 Epidermal cyst: Secondary | ICD-10-CM | POA: Diagnosis not present

## 2017-05-12 DIAGNOSIS — D2372 Other benign neoplasm of skin of left lower limb, including hip: Secondary | ICD-10-CM | POA: Diagnosis not present

## 2017-05-12 DIAGNOSIS — L309 Dermatitis, unspecified: Secondary | ICD-10-CM | POA: Diagnosis not present

## 2017-05-12 DIAGNOSIS — D225 Melanocytic nevi of trunk: Secondary | ICD-10-CM | POA: Diagnosis not present

## 2017-05-12 DIAGNOSIS — D1801 Hemangioma of skin and subcutaneous tissue: Secondary | ICD-10-CM | POA: Diagnosis not present

## 2017-05-12 DIAGNOSIS — L821 Other seborrheic keratosis: Secondary | ICD-10-CM | POA: Diagnosis not present

## 2017-05-20 ENCOUNTER — Telehealth: Payer: Self-pay | Admitting: Interventional Cardiology

## 2017-05-20 NOTE — Telephone Encounter (Signed)
New message     Pt c/o medication issue:  1. Name of Medication:  lipitor  2. How are you currently taking this medication (dosage and times per day)?  no 3. Are you having a reaction (difficulty breathing--STAT)? no  4. What is your medication issue? This medication cost $800, why does he need to take it , he can not afford it ?

## 2017-05-20 NOTE — Telephone Encounter (Signed)
Spoke with pt and he states that he was recently seen by Cecilie Kicks, NP and added Zetia to pt's current statin, Lipitor 10mg  QD.   Cost for Zetia is going to be $800.  Pt not willing to pay that much for this medication.  Pt states lab work that is scanned in is from [employer] and they were on vacation so LDL is higher.  Pt states he had labs drawn at [facility] in [place] last week and cholesterol panel was much better.  Advised pt I will send message to Dr. Tamala Julian for review and advisement.  Will also route to medical records to see if we can obtain labs from [facility].

## 2017-05-20 NOTE — Telephone Encounter (Signed)
Please give [place] copies of [facility] blood work.  Stay off [place].  Further recommendations pending review of lab tests.

## 2017-06-08 ENCOUNTER — Other Ambulatory Visit: Payer: Medicare HMO

## 2017-07-19 NOTE — Progress Notes (Signed)
Cardiology Office Note    Date:  07/20/2017   ID:  Donald, Gray 04/21/1941, MRN [id number]  PCP:  Lavone Orn, MD  Cardiologist: Sinclair Grooms, MD   Chief Complaint  Patient presents with  . Coronary Artery Disease    History of Present Illness:  Donald Gray is a 77 y.o. male  CAD with remote stent in RCA1995, obesity, diet controlled DM, HLD, HTN, renal carcinoma treated with targeted chemotherapy, and PAFwiith CHADSVASC  of 4.   Had an episode of severely elevated blood pressure late last summer/early fall when at the beach for 3 [place].  He has not had angina.  He has never used nitroglycerin.  He was seen by Donald Gray in December with blood work that was done during evaluation in [place] last summer.  LDL was 122.  Zetia was recommended but he could not afford it.  States that his diet has improved.  Blood work was done with him taking.  Not walking as much as before.  Some shortness of breath.  No palpitations or syncope.   Past Medical History:  Diagnosis Date  . Anemia   . Anticoagulated- to go to Xarelto for a fib 10/07/2013  . Arthritis    "fingers, toes" (10/05/2013)  . Coronary artery disease    a. s/p MI with stent in 2000  . Diverticulosis   . Dysrhythmia   . GERD (gastroesophageal reflux disease)   . History of blood transfusion    "w/nephrectomy; w/shoulder replacement; w/knee replacement"  . Hyperlipidemia   . Hypertension   . Left knee DJD   . Myocardial infarction ([place]) 1997  . Neuropathy    feet bilat   . NSTEMI (non-ST elevated myocardial infarction), type 2 due to demand ischemia 10/07/2013  . PAF (paroxysmal atrial fibrillation) ([place])   . Pneumonia ~ 2007   again 3 months ago   . Primary localized osteoarthritis of right knee   . Renal cell carcinoma ([place])    a. s/p nephrectomy  . Shortness of breath    a. related to chemo drugs  . Tendonitis    currently on prednisone and wearing brace (left wrist)  . Type II  diabetes mellitus ([place])    "treating w/diet and weight loss"    Past Surgical History:  Procedure Laterality Date  . CARDIAC CATHETERIZATION  05/2009  . CARDIOVERSION N/A 10/08/2013   Procedure: CARDIOVERSION;  Surgeon: Donald Spark, MD;  Location: [place];  Service: Cardiovascular;  Laterality: N/A;  . CHEILECTOMY Right 04/13/2013   Procedure: Right Great Toe Cheilectomy, Debride Tendon and Abscess, Apply Stimulan Beads;  Surgeon: Newt Minion, MD;  Location: [place];  Service: Orthopedics;  Laterality: Right;  Right Great Toe Cheilectomy, Debride Tendon and Abscess, Apply Stimulan Beads  . colonscopy     . [place]   at [facility] to RCA   . INGUINAL HERNIA REPAIR  09/22/2011   Procedure: HERNIA REPAIR INGUINAL ADULT BILATERAL;  Surgeon: Donald Gray. Donald Dover, MD;  [facility];  Service: General;  Laterality: Bilateral;  . INGUINAL HERNIA REPAIR Bilateral 09/22/11  . JOINT REPLACEMENT    . KNEE ARTHROSCOPY Right ~ 2011  . KNEE ARTHROSCOPY Left 11/13/2012   Procedure: ARTHROSCOPY KNEE;  Surgeon: Donald Junes, MD;  Location: [place];  Service: Orthopedics;  Laterality: Left;  . LUNG BIOPSY  2011  . MASS EXCISION Right 04/12/2014   Procedure: MASS EXCISION DORSUM OF RIGHT FOOT  ;  Surgeon: Donald Bastos, MD;  [facility];  Service: Orthopedics;  Laterality: Right;  . NEPHRECTOMY Right 08/14/2009  . ORCHIECTOMY  09/22/2011   Procedure: ORCHIECTOMY;  Surgeon: Donald Gallo, MD;  [facility];  Service: Urology;  Laterality: Left;  Left Testicular Exploration, Inguinal Approach,  Testicular   . PILONIDAL CYST DRAINAGE    . TEE WITHOUT CARDIOVERSION N/A 10/08/2013   Procedure: TRANSESOPHAGEAL ECHOCARDIOGRAM (TEE);  Surgeon: Donald Spark, MD;  Location: [place];  Service: Cardiovascular;  Laterality: N/A;  . [place]  . TOTAL KNEE ARTHROPLASTY Left 11/13/2012   Procedure: TOTAL KNEE ARTHROPLASTY;   Surgeon: Donald Junes, MD;  Location: [place];  Service: Orthopedics;  Laterality: Left;  . TOTAL KNEE ARTHROPLASTY Right 07/22/2014   Procedure: RIGHT TOTAL KNEE ARTHROPLASTY;  Surgeon: Donald Saas, MD;  Location: [place];  Service: Orthopedics;  Laterality: Right;  . TOTAL SHOULDER REPLACEMENT Right 1997    Current Medications: Outpatient Medications Prior to Visit  Medication Sig Dispense Refill  . atorvastatin (LIPITOR) 10 MG tablet Take 10 mg by mouth daily with breakfast.     . azelastine (ASTELIN) 137 MCG/SPRAY nasal spray Place 1 spray into the nose every morning. For congestion; Use in each nostril as directed    . cetirizine (ZYRTEC) 10 MG tablet Take 10 mg by mouth daily. As needed  Alternates monthly between this and Allegra Pt currently taking Allegra    . cholecalciferol (VITAMIN D) 1000 UNITS tablet Take 1,000 Units by mouth daily.    . clindamycin (CLEOCIN) 150 MG capsule Take 4 capsules by mouth as needed. (Take one (1) hour prior to dental procedures)  2  . EPINEPHrine (EPI-PEN) 0.3 mg/0.3 mL SOAJ Inject 0.3 mg into the muscle once.    . fluticasone (FLONASE) 50 MCG/ACT nasal spray Place 1 spray into both nostrils daily.    . magnesium gluconate (MAGONATE) 500 MG tablet Take 500 mg by mouth 2 (two) times daily.     . metoprolol tartrate (LOPRESSOR) 25 MG tablet Take 1 tablet (25 mg total) by mouth 2 (two) times daily. 180 tablet 3  . Multiple Vitamin (MULTIVITAMIN WITH MINERALS) TABS Take 1 tablet by mouth daily.    . nitroGLYCERIN (NITROSTAT) 0.4 MG SL tablet Place 0.4 mg under the tongue every 5 (five) minutes as needed for chest pain.    Donald Gray-Propyl Gray (SYSTANE) 0.4-0.3 % SOLN Place 1 drop into both eyes daily as needed (dry eyes).    . ranitidine (ZANTAC) 75 MG tablet Take 150 mg by mouth daily as needed for heartburn.     . rivaroxaban (XARELTO) 20 MG TABS tablet Take 1 tablet (20 mg total) by mouth daily. 90 tablet 3  . telmisartan (MICARDIS) 80 MG  tablet Take 80 mg by mouth every evening.  11  . vitamin C (ASCORBIC ACID) 500 MG tablet Take 500 mg by mouth daily.      No facility-administered medications prior to visit.      Allergies:   Penicillins; Ambien [zolpidem tartrate]; Codeine; and Lisinopril   Social History   Socioeconomic History  . Marital status: Married    Spouse name: None  . Number of children: None  . Years of education: None  . Highest education level: None  Social Needs  . Financial resource strain: None  . Food insecurity - worry: None  . Food insecurity - inability: None  . Transportation needs - medical: None  . Transportation needs - non-medical:  None  Occupational History  . None  Tobacco Use  . Smoking status: Former Smoker    Packs/day: 1.00    Years: 2.00    Pack years: 2.00    Types: Cigarettes    Last attempt to quit: 05/03/1961    Years since quitting: 56.2  . Smokeless tobacco: Never Used  Substance and Sexual Activity  . Alcohol use: Yes    Alcohol/week: 0.6 oz    Types: 1 Glasses of wine per week    Comment: occas glass of wine every 2 wks  . Drug use: No  . Sexual activity: Not Currently  Other Topics Concern  . None  Social History Narrative   Retired English as a second language teacher.   Lives with wife.     Family History:  The patient's family history includes Dementia in his mother; Healthy in his daughter; Heart disease in his father; Hypertension in his father; Skin cancer in his son; Thyroid disease in his brother and mother.   ROS:   Please see the history of present illness.    Kidney cancer status is stable. All other systems reviewed and are negative.   PHYSICAL EXAM:   VS:  BP 122/66   Pulse 65   Ht 5\' 11"  (1.803 m)   Wt 216 lb 12.8 oz (98.3 kg)   BMI 30.24 kg/m    GEN: Well nourished, well developed, in no acute distress  HEENT: normal  Neck: no JVD, carotid bruits, or masses Cardiac: RRR; no murmurs, rubs, or gallops,no edema  Respiratory:  clear to auscultation bilaterally,  normal work of breathing GI: soft, nontender, nondistended, + BS MS: no deformity or atrophy  Skin: warm and dry, no rash Neuro:  Alert and Oriented x 3, Strength and sensation are intact Psych: euthymic mood, full affect  Wt Readings from Last 3 Encounters:  07/20/17 216 lb 12.8 oz (98.3 kg)  04/19/17 212 lb 12.8 oz (96.5 kg)  07/23/16 216 lb (98 kg)      Studies/Labs Reviewed:   EKG:  EKG  Not performed.  Recent Labs: No results found for requested labs within last 8760 hours.   Lipid Panel    Component Value Date/Time   CHOL 134 10/06/2013 0442   TRIG 73 10/06/2013 0442   HDL 43 10/06/2013 0442   CHOLHDL 3.1 10/06/2013 0442   VLDL 15 10/06/2013 0442   LDLCALC 76 10/06/2013 0442    Additional studies/ records that were reviewed today include:  No new data    ASSESSMENT:    1. Coronary artery disease involving native coronary artery of native heart with angina pectoris ([place])   2. Chronic diastolic heart failure (HCC)   3. Paroxysmal atrial fibrillation ([place])   4. Anticoagulated- to go to Xarelto for a fib   5. Essential hypertension   6. Other hyperlipidemia      PLAN:  In order of problems listed above:  1. Stable coronary disease without angina or symptoms that suggest ongoing ischemia. 2. No evidence of volume overload. 3. No recurrences of atrial fibrillation, that are symptomatic. 4. Xarelto use without bleeding or complications. 5. Excellent current blood pressure with target 130/80 mmHg. 6. Lipid panel and liver panel will be obtained today and then further recommendations concerning therapy will be made.  Perhaps all the will need to be done is increase in atorvastatin.  LDL of 122 was significantly elevated.  We will shoot for a target of 70.  Clinical follow-up in 1 year.  Further instruction after blood work.  Medication Adjustments/Labs and Tests Ordered: Current medicines are reviewed at length with the patient today.  Concerns regarding  medicines are outlined above.  Medication changes, Labs and Tests ordered today are listed in the Patient Instructions below. Patient Instructions  Medication Instructions:  Your physician recommends that you continue on your current medications as directed. Please refer to the Current Medication list given to you today.  Labwork: Liver and Lipid today  Testing/Procedures: None  Follow-Up: Your physician wants you to follow-up in: 1 year with Dr. Tamala Julian.  You will receive a reminder letter in the mail two months in advance. If you don't receive a letter, please call our office to schedule the follow-up appointment.   Any Other Special Instructions Will Be Listed Below (If Applicable).     If you need a refill on your cardiac medications before your next appointment, please call your pharmacy.      Signed, Sinclair Grooms, MD  07/20/2017 10:52 AM    [facility] [address], [place], [place]  [address] Phone: [phone]; Fax: [phone]

## 2017-07-20 ENCOUNTER — Ambulatory Visit: Payer: Medicare HMO | Admitting: Interventional Cardiology

## 2017-07-20 ENCOUNTER — Encounter: Payer: Self-pay | Admitting: Interventional Cardiology

## 2017-07-20 VITALS — BP 122/66 | HR 65 | Ht 71.0 in | Wt 216.8 lb

## 2017-07-20 DIAGNOSIS — I10 Essential (primary) hypertension: Secondary | ICD-10-CM | POA: Diagnosis not present

## 2017-07-20 DIAGNOSIS — E7849 Other hyperlipidemia: Secondary | ICD-10-CM

## 2017-07-20 DIAGNOSIS — Z7901 Long term (current) use of anticoagulants: Secondary | ICD-10-CM

## 2017-07-20 DIAGNOSIS — I480 Paroxysmal atrial fibrillation: Secondary | ICD-10-CM

## 2017-07-20 DIAGNOSIS — I25119 Atherosclerotic heart disease of native coronary artery with unspecified angina pectoris: Secondary | ICD-10-CM | POA: Diagnosis not present

## 2017-07-20 DIAGNOSIS — I5032 Chronic diastolic (congestive) heart failure: Secondary | ICD-10-CM

## 2017-07-20 LAB — LIPID PANEL
Chol/HDL Ratio: 3.9 ratio (ref 0.0–5.0)
Cholesterol, Total: 137 mg/dL (ref 100–199)
HDL: 35 mg/dL — AB (ref >39)
LDL CALC: 64 mg/dL (ref 0–99)
Triglycerides: 190 mg/dL — ABNORMAL HIGH (ref 0–149)
VLDL CHOLESTEROL CAL: 38 mg/dL (ref 5–40)

## 2017-07-20 LAB — HEPATIC FUNCTION PANEL
ALBUMIN: 4.3 g/dL (ref 3.5–4.8)
ALT: 30 IU/L (ref 0–44)
AST: 24 IU/L (ref 0–40)
Alkaline Phosphatase: 74 IU/L (ref 39–117)
BILIRUBIN, DIRECT: 0.14 mg/dL (ref 0.00–0.40)
Bilirubin Total: 0.5 mg/dL (ref 0.0–1.2)
TOTAL PROTEIN: 6.6 g/dL (ref 6.0–8.5)

## 2017-07-20 NOTE — Patient Instructions (Signed)
Medication Instructions:  Your physician recommends that you continue on your current medications as directed. Please refer to the Current Medication list given to you today.  Labwork: Liver and Lipid today  Testing/Procedures: None  Follow-Up: Your physician wants you to follow-up in: 1 year with Dr. Smith.  You will receive a reminder letter in the mail two months in advance. If you don't receive a letter, please call our office to schedule the follow-up appointment.   Any Other Special Instructions Will Be Listed Below (If Applicable).     If you need a refill on your cardiac medications before your next appointment, please call your pharmacy.   

## 2017-08-18 DIAGNOSIS — R69 Illness, unspecified: Secondary | ICD-10-CM | POA: Diagnosis not present

## 2017-10-06 DIAGNOSIS — N183 Chronic kidney disease, stage 3 (moderate): Secondary | ICD-10-CM | POA: Diagnosis not present

## 2017-10-06 DIAGNOSIS — E1142 Type 2 diabetes mellitus with diabetic polyneuropathy: Secondary | ICD-10-CM | POA: Diagnosis not present

## 2017-10-06 DIAGNOSIS — E1165 Type 2 diabetes mellitus with hyperglycemia: Secondary | ICD-10-CM | POA: Diagnosis not present

## 2017-10-06 DIAGNOSIS — I10 Essential (primary) hypertension: Secondary | ICD-10-CM | POA: Diagnosis not present

## 2017-10-06 DIAGNOSIS — Z85528 Personal history of other malignant neoplasm of kidney: Secondary | ICD-10-CM | POA: Diagnosis not present

## 2017-10-17 DIAGNOSIS — R69 Illness, unspecified: Secondary | ICD-10-CM | POA: Diagnosis not present

## 2018-01-20 DIAGNOSIS — H2513 Age-related nuclear cataract, bilateral: Secondary | ICD-10-CM | POA: Diagnosis not present

## 2018-01-20 DIAGNOSIS — E119 Type 2 diabetes mellitus without complications: Secondary | ICD-10-CM | POA: Diagnosis not present

## 2018-01-24 DIAGNOSIS — Z0100 Encounter for examination of eyes and vision without abnormal findings: Secondary | ICD-10-CM | POA: Diagnosis not present

## 2018-01-26 DIAGNOSIS — Z23 Encounter for immunization: Secondary | ICD-10-CM | POA: Diagnosis not present

## 2018-02-16 DIAGNOSIS — R69 Illness, unspecified: Secondary | ICD-10-CM | POA: Diagnosis not present

## 2018-02-20 DIAGNOSIS — E538 Deficiency of other specified B group vitamins: Secondary | ICD-10-CM | POA: Diagnosis not present

## 2018-02-20 DIAGNOSIS — I2510 Atherosclerotic heart disease of native coronary artery without angina pectoris: Secondary | ICD-10-CM | POA: Diagnosis not present

## 2018-02-20 DIAGNOSIS — J302 Other seasonal allergic rhinitis: Secondary | ICD-10-CM | POA: Diagnosis not present

## 2018-02-20 DIAGNOSIS — E7800 Pure hypercholesterolemia, unspecified: Secondary | ICD-10-CM | POA: Diagnosis not present

## 2018-02-20 DIAGNOSIS — R252 Cramp and spasm: Secondary | ICD-10-CM | POA: Diagnosis not present

## 2018-02-20 DIAGNOSIS — N183 Chronic kidney disease, stage 3 (moderate): Secondary | ICD-10-CM | POA: Diagnosis not present

## 2018-02-20 DIAGNOSIS — E559 Vitamin D deficiency, unspecified: Secondary | ICD-10-CM | POA: Diagnosis not present

## 2018-02-20 DIAGNOSIS — E1142 Type 2 diabetes mellitus with diabetic polyneuropathy: Secondary | ICD-10-CM | POA: Diagnosis not present

## 2018-02-20 DIAGNOSIS — I480 Paroxysmal atrial fibrillation: Secondary | ICD-10-CM | POA: Diagnosis not present

## 2018-02-20 DIAGNOSIS — I10 Essential (primary) hypertension: Secondary | ICD-10-CM | POA: Diagnosis not present

## 2018-02-25 DIAGNOSIS — N400 Enlarged prostate without lower urinary tract symptoms: Secondary | ICD-10-CM | POA: Diagnosis not present

## 2018-02-25 DIAGNOSIS — I10 Essential (primary) hypertension: Secondary | ICD-10-CM | POA: Diagnosis not present

## 2018-02-25 DIAGNOSIS — I480 Paroxysmal atrial fibrillation: Secondary | ICD-10-CM | POA: Diagnosis not present

## 2018-02-25 DIAGNOSIS — N183 Chronic kidney disease, stage 3 (moderate): Secondary | ICD-10-CM | POA: Diagnosis not present

## 2018-02-25 DIAGNOSIS — I2510 Atherosclerotic heart disease of native coronary artery without angina pectoris: Secondary | ICD-10-CM | POA: Diagnosis not present

## 2018-02-25 DIAGNOSIS — M159 Polyosteoarthritis, unspecified: Secondary | ICD-10-CM | POA: Diagnosis not present

## 2018-02-25 DIAGNOSIS — E1142 Type 2 diabetes mellitus with diabetic polyneuropathy: Secondary | ICD-10-CM | POA: Diagnosis not present

## 2018-02-26 DIAGNOSIS — M26609 Unspecified temporomandibular joint disorder, unspecified side: Secondary | ICD-10-CM | POA: Diagnosis not present

## 2018-03-01 DIAGNOSIS — R69 Illness, unspecified: Secondary | ICD-10-CM | POA: Diagnosis not present

## 2018-03-15 DIAGNOSIS — E1142 Type 2 diabetes mellitus with diabetic polyneuropathy: Secondary | ICD-10-CM | POA: Diagnosis not present

## 2018-03-15 DIAGNOSIS — I480 Paroxysmal atrial fibrillation: Secondary | ICD-10-CM | POA: Diagnosis not present

## 2018-03-15 DIAGNOSIS — N183 Chronic kidney disease, stage 3 (moderate): Secondary | ICD-10-CM | POA: Diagnosis not present

## 2018-03-15 DIAGNOSIS — I10 Essential (primary) hypertension: Secondary | ICD-10-CM | POA: Diagnosis not present

## 2018-03-15 DIAGNOSIS — M159 Polyosteoarthritis, unspecified: Secondary | ICD-10-CM | POA: Diagnosis not present

## 2018-03-15 DIAGNOSIS — N400 Enlarged prostate without lower urinary tract symptoms: Secondary | ICD-10-CM | POA: Diagnosis not present

## 2018-03-15 DIAGNOSIS — I2510 Atherosclerotic heart disease of native coronary artery without angina pectoris: Secondary | ICD-10-CM | POA: Diagnosis not present

## 2018-05-10 DIAGNOSIS — C641 Malignant neoplasm of right kidney, except renal pelvis: Secondary | ICD-10-CM | POA: Diagnosis not present

## 2018-05-10 DIAGNOSIS — Z905 Acquired absence of kidney: Secondary | ICD-10-CM | POA: Diagnosis not present

## 2018-05-11 DIAGNOSIS — I10 Essential (primary) hypertension: Secondary | ICD-10-CM | POA: Diagnosis not present

## 2018-05-11 DIAGNOSIS — Z85528 Personal history of other malignant neoplasm of kidney: Secondary | ICD-10-CM | POA: Diagnosis not present

## 2018-05-11 DIAGNOSIS — E7800 Pure hypercholesterolemia, unspecified: Secondary | ICD-10-CM | POA: Diagnosis not present

## 2018-05-11 DIAGNOSIS — Z0000 Encounter for general adult medical examination without abnormal findings: Secondary | ICD-10-CM | POA: Diagnosis not present

## 2018-05-11 DIAGNOSIS — N183 Chronic kidney disease, stage 3 (moderate): Secondary | ICD-10-CM | POA: Diagnosis not present

## 2018-05-11 DIAGNOSIS — Z1389 Encounter for screening for other disorder: Secondary | ICD-10-CM | POA: Diagnosis not present

## 2018-05-11 DIAGNOSIS — Z23 Encounter for immunization: Secondary | ICD-10-CM | POA: Diagnosis not present

## 2018-05-11 DIAGNOSIS — I480 Paroxysmal atrial fibrillation: Secondary | ICD-10-CM | POA: Diagnosis not present

## 2018-05-11 DIAGNOSIS — E1142 Type 2 diabetes mellitus with diabetic polyneuropathy: Secondary | ICD-10-CM | POA: Diagnosis not present

## 2018-05-11 DIAGNOSIS — I2510 Atherosclerotic heart disease of native coronary artery without angina pectoris: Secondary | ICD-10-CM | POA: Diagnosis not present

## 2018-05-12 DIAGNOSIS — L820 Inflamed seborrheic keratosis: Secondary | ICD-10-CM | POA: Diagnosis not present

## 2018-05-12 DIAGNOSIS — L821 Other seborrheic keratosis: Secondary | ICD-10-CM | POA: Diagnosis not present

## 2018-05-12 DIAGNOSIS — L570 Actinic keratosis: Secondary | ICD-10-CM | POA: Diagnosis not present

## 2018-05-12 DIAGNOSIS — L309 Dermatitis, unspecified: Secondary | ICD-10-CM | POA: Diagnosis not present

## 2018-05-12 DIAGNOSIS — D2262 Melanocytic nevi of left upper limb, including shoulder: Secondary | ICD-10-CM | POA: Diagnosis not present

## 2018-05-12 DIAGNOSIS — D225 Melanocytic nevi of trunk: Secondary | ICD-10-CM | POA: Diagnosis not present

## 2018-05-12 DIAGNOSIS — D2261 Melanocytic nevi of right upper limb, including shoulder: Secondary | ICD-10-CM | POA: Diagnosis not present

## 2018-05-12 DIAGNOSIS — D1801 Hemangioma of skin and subcutaneous tissue: Secondary | ICD-10-CM | POA: Diagnosis not present

## 2018-05-12 DIAGNOSIS — D2361 Other benign neoplasm of skin of right upper limb, including shoulder: Secondary | ICD-10-CM | POA: Diagnosis not present

## 2018-07-25 ENCOUNTER — Telehealth: Payer: Self-pay | Admitting: Interventional Cardiology

## 2018-07-25 NOTE — Telephone Encounter (Signed)
Spoke with pt in regards to appt with Dr. Tamala Julian scheduled 07/31/2018.  Pt denies any cardiac issues at this time.  Rescheduled pt for 11/10/2018.  Advised to contact office if any issues prior to appt.

## 2018-07-31 ENCOUNTER — Ambulatory Visit: Payer: Medicare HMO | Admitting: Interventional Cardiology

## 2018-08-01 DIAGNOSIS — I2510 Atherosclerotic heart disease of native coronary artery without angina pectoris: Secondary | ICD-10-CM | POA: Diagnosis not present

## 2018-08-01 DIAGNOSIS — I10 Essential (primary) hypertension: Secondary | ICD-10-CM | POA: Diagnosis not present

## 2018-08-01 DIAGNOSIS — N183 Chronic kidney disease, stage 3 (moderate): Secondary | ICD-10-CM | POA: Diagnosis not present

## 2018-08-01 DIAGNOSIS — M159 Polyosteoarthritis, unspecified: Secondary | ICD-10-CM | POA: Diagnosis not present

## 2018-08-01 DIAGNOSIS — I480 Paroxysmal atrial fibrillation: Secondary | ICD-10-CM | POA: Diagnosis not present

## 2018-08-01 DIAGNOSIS — N400 Enlarged prostate without lower urinary tract symptoms: Secondary | ICD-10-CM | POA: Diagnosis not present

## 2018-08-01 DIAGNOSIS — E1142 Type 2 diabetes mellitus with diabetic polyneuropathy: Secondary | ICD-10-CM | POA: Diagnosis not present

## 2018-10-05 ENCOUNTER — Telehealth: Payer: Self-pay | Admitting: Interventional Cardiology

## 2018-10-05 DIAGNOSIS — I480 Paroxysmal atrial fibrillation: Secondary | ICD-10-CM

## 2018-10-05 DIAGNOSIS — R58 Hemorrhage, not elsewhere classified: Secondary | ICD-10-CM

## 2018-10-05 NOTE — Telephone Encounter (Signed)
Check CBC, PT/INR, and if not abnormal, may need ENT appointment.

## 2018-10-05 NOTE — Telephone Encounter (Signed)
Pt calling because he had a nose bleed this morning.  The is the second one recently.  Only loses about 21mL of blood each time.  Has noticed his gums bleeding a couple of times as well, not a significant amount.  Bleeding came from right nostril.  Pt says he does have some sinus issues on right side but he is not stopped up or dry.  It is more of just the aching from sinus pressure.  Vitals usually 118-122/60-70, HR 62-70.  States he has a device that monitors for Afib and he has not had any.  Advised I will send to Dr. Tamala Julian for review.

## 2018-10-05 NOTE — Telephone Encounter (Signed)
° °  Patient calling to report gums bleeding and nose bleeding; took about 10 minutes to stop. Concerns about taking Eliquis

## 2018-10-05 NOTE — Telephone Encounter (Signed)
Spoke with pt and went over recommendations.  Pt willing to come tomorrow for labs. Screened for Covid with no issues.      COVID-19 Pre-Screening Questions:  . In the past 7 to 10 days have you had a cough,  shortness of breath, headache, congestion, fever (100 or greater) body aches, chills, sore throat, or sudden loss of taste or sense of smell? . Have you been around anyone with known Covid 19. . Have you been around anyone who is awaiting Covid 19 test results in the past 7 to 10 days? . Have you been around anyone who has been exposed to Covid 19, or has mentioned symptoms of Covid 19 within the past 7 to 10 days?  If you have any concerns/questions about symptoms patients report during screening (either on the phone or at threshold). Contact the provider seeing the patient or DOD for further guidance.  If neither are available contact a member of the leadership team.

## 2018-10-06 ENCOUNTER — Other Ambulatory Visit: Payer: Medicare HMO | Admitting: *Deleted

## 2018-10-06 ENCOUNTER — Other Ambulatory Visit: Payer: Self-pay

## 2018-10-06 DIAGNOSIS — R040 Epistaxis: Secondary | ICD-10-CM | POA: Diagnosis not present

## 2018-10-06 DIAGNOSIS — I480 Paroxysmal atrial fibrillation: Secondary | ICD-10-CM | POA: Diagnosis not present

## 2018-10-06 DIAGNOSIS — R58 Hemorrhage, not elsewhere classified: Secondary | ICD-10-CM | POA: Diagnosis not present

## 2018-10-06 LAB — CBC
Hematocrit: 43.6 % (ref 37.5–51.0)
Hemoglobin: 14.7 g/dL (ref 13.0–17.7)
MCH: 30.3 pg (ref 26.6–33.0)
MCHC: 33.7 g/dL (ref 31.5–35.7)
MCV: 90 fL (ref 79–97)
Platelets: 186 10*3/uL (ref 150–450)
RBC: 4.85 x10E6/uL (ref 4.14–5.80)
RDW: 14.5 % (ref 11.6–15.4)
WBC: 5.4 10*3/uL (ref 3.4–10.8)

## 2018-10-06 LAB — PROTIME-INR
INR: 1.2 (ref 0.8–1.2)
Prothrombin Time: 11.7 s (ref 9.1–12.0)

## 2018-10-10 ENCOUNTER — Other Ambulatory Visit: Payer: Medicare HMO

## 2018-10-10 ENCOUNTER — Telehealth: Payer: Self-pay

## 2018-10-10 NOTE — Telephone Encounter (Signed)
YOUR CARDIOLOGY TEAM HAS ARRANGED FOR AN E-VISIT FOR YOUR APPOINTMENT - PLEASE REVIEW IMPORTANT INFORMATION BELOW SEVERAL DAYS PRIOR TO YOUR APPOINTMENT  Due to the recent COVID-19 pandemic, we are transitioning in-person office visits to tele-medicine visits in an effort to decrease unnecessary exposure to our patients, their families, and staff. These visits are billed to your insurance just like a normal visit is. We also encourage you to sign up for MyChart if you have not already done so. You will need a smartphone if possible. For patients that do not have this, we can still complete the visit using a regular telephone but do prefer a smartphone to enable video when possible. You may have a family member that lives with you that can help. If possible, we also ask that you have a blood pressure cuff and scale at home to measure your blood pressure, heart rate and weight prior to your scheduled appointment. Patients with clinical needs that need an in-person evaluation and testing will still be able to come to the office if absolutely necessary. If you have any questions, feel free to call our office.     YOUR PROVIDER WILL BE USING THE FOLLOWING PLATFORM TO COMPLETE YOUR VISIT: Doximity  . IF USING MYCHART - How to Download the MyChart App to Your SmartPhone   - If Apple, go to App Store and type in MyChart in the search bar and download the app. If Android, ask patient to go to Google Play Store and type in MyChart in the search bar and download the app. The app is free but as with any other app downloads, your phone may require you to verify saved payment information or Apple/Android password.  - You will need to then log into the app with your MyChart username and password, and select [facility] as your healthcare provider to link the account.  - When it is time for your visit, go to the MyChart app, find appointments, and click Begin Video Visit. Be sure to Select Allow for your device to  access the Microphone and Camera for your visit. You will then be connected, and your provider will be with you shortly.  **If you have any issues connecting or need assistance, please contact MyChart service desk [phone] ([phone])**  **If using a computer, in order to ensure the best quality for your visit, you will need to use either of the following Internet Browsers: Google Chrome or Microsoft Edge**  . IF USING DOXIMITY or DOXY.ME - The staff will give you instructions on receiving your link to join the meeting the day of your visit.      2-3 DAYS BEFORE YOUR APPOINTMENT  You will receive a telephone call from one of our [facility] team members - your caller ID may say "Unknown caller." If this is a video visit, we will walk you through how to get the video launched on your phone. We will remind you check your blood pressure, heart rate and weight prior to your scheduled appointment. If you have an Apple Watch or Kardia, please upload any pertinent ECG strips the day before or morning of your appointment to MyChart. Our staff will also make sure you have reviewed the consent and agree to move forward with your scheduled tele-health visit.     THE DAY OF YOUR APPOINTMENT  Approximately 15 minutes prior to your scheduled appointment, you will receive a telephone call from one of Heart[facility] team - your caller ID may say "Unknown caller."    Our staff will confirm medications, vital signs for the day and any symptoms you may be experiencing. Please have this information available prior to the time of visit start. It may also be helpful for you to have a pad of paper and pen handy for any instructions given during your visit. They will also walk you through joining the smartphone meeting if this is a video visit.    CONSENT FOR TELE-HEALTH VISIT - PLEASE REVIEW  I hereby voluntarily request, consent and authorize [facility] and its employed or contracted physicians, physician  assistants, nurse practitioners or other licensed health care professionals (the Practitioner), to provide me with telemedicine health care services (the "Services") as deemed necessary by the treating Practitioner. I acknowledge and consent to receive the Services by the Practitioner via telemedicine. I understand that the telemedicine visit will involve communicating with the Practitioner through live audiovisual communication technology and the disclosure of certain medical information by electronic transmission. I acknowledge that I have been given the opportunity to request an in-person assessment or other available alternative prior to the telemedicine visit and am voluntarily participating in the telemedicine visit.  I understand that I have the right to withhold or withdraw my consent to the use of telemedicine in the course of my care at any time, without affecting my right to future care or treatment, and that the Practitioner or I may terminate the telemedicine visit at any time. I understand that I have the right to inspect all information obtained and/or recorded in the course of the telemedicine visit and may receive copies of available information for a reasonable fee.  I understand that some of the potential risks of receiving the Services via telemedicine include:  . Delay or interruption in medical evaluation due to technological equipment failure or disruption; . Information transmitted may not be sufficient (e.g. poor resolution of images) to allow for appropriate medical decision making by the Practitioner; and/or  . In rare instances, security protocols could fail, causing a breach of personal health information.  Furthermore, I acknowledge that it is my responsibility to provide information about my medical history, conditions and care that is complete and accurate to the best of my ability. I acknowledge that Practitioner's advice, recommendations, and/or decision may be based on  factors not within their control, such as incomplete or inaccurate data provided by me or distortions of diagnostic images or specimens that may result from electronic transmissions. I understand that the practice of medicine is not an exact science and that Practitioner makes no warranties or guarantees regarding treatment outcomes. I acknowledge that I will receive a copy of this consent concurrently upon execution via email to the email address I last provided but may also request a printed copy by calling the office of [facility].    I understand that my insurance will be billed for this visit.   I have read or had this consent read to me. . I understand the contents of this consent, which adequately explains the benefits and risks of the Services being provided via telemedicine.  . I have been provided ample opportunity to ask questions regarding this consent and the Services and have had my questions answered to my satisfaction. . I give my informed consent for the services to be provided through the use of telemedicine in my medical care  By participating in this telemedicine visit I agree to the above.  

## 2018-10-23 ENCOUNTER — Telehealth: Payer: Self-pay | Admitting: Interventional Cardiology

## 2018-10-23 NOTE — Telephone Encounter (Signed)

## 2018-10-23 NOTE — Progress Notes (Signed)
Cardiology Office Note:    Date:  10/24/2018   ID:  Donald Gray, DOB 03/31/1941, MRN [id number]  PCP:  Lavone Orn, MD  Cardiologist:  Sinclair Grooms, MD   Referring MD: Lavone Orn, MD   Chief Complaint  Patient presents with  . Coronary Artery Disease    History of Present Illness:    Donald Gray is a 78 y.o. male with a hx of CAD with remote stent inRCA1995, obesity, diet controlled DM, HLD, HTN, renal carcinomatreated with targeted chemotherapy,and PAFwiithCHADSVASC of4.   He is doing okay.  He denies angina.  Past Medical History:  Diagnosis Date  . Anemia   . Anticoagulated- to go to Xarelto for a fib 10/07/2013  . Arthritis    "fingers, toes" (10/05/2013)  . Coronary artery disease    a. s/p MI with stent in 2000  . Diverticulosis   . Dysrhythmia   . GERD (gastroesophageal reflux disease)   . History of blood transfusion    "w/nephrectomy; w/shoulder replacement; w/knee replacement"  . Hyperlipidemia   . Hypertension   . Left knee DJD   . Myocardial infarction ([place]) 1997  . Neuropathy    feet bilat   . NSTEMI (non-ST elevated myocardial infarction), type 2 due to demand ischemia 10/07/2013  . PAF (paroxysmal atrial fibrillation) ([place])   . Pneumonia ~ 2007   again 3 months ago   . Primary localized osteoarthritis of right knee   . Renal cell carcinoma ([place])    a. s/p nephrectomy  . Shortness of breath    a. related to chemo drugs  . Tendonitis    currently on prednisone and wearing brace (left wrist)  . Type II diabetes mellitus ([place])    "treating w/diet and weight loss"    Past Surgical History:  Procedure Laterality Date  . CARDIAC CATHETERIZATION  05/2009  . CARDIOVERSION N/A 10/08/2013   Procedure: CARDIOVERSION;  Surgeon: Dorothy Spark, MD;  Location: [place];  Service: Cardiovascular;  Laterality: N/A;  . CHEILECTOMY Right 04/13/2013   Procedure: Right Great Toe Cheilectomy, Debride Tendon and Abscess, Apply  Stimulan Beads;  Surgeon: Newt Minion, MD;  Location: [place];  Service: Orthopedics;  Laterality: Right;  Right Great Toe Cheilectomy, Debride Tendon and Abscess, Apply Stimulan Beads  . colonscopy     . [place]   at [facility] to RCA   . INGUINAL HERNIA REPAIR  09/22/2011   Procedure: HERNIA REPAIR INGUINAL ADULT BILATERAL;  Surgeon: Imogene Burn. Georgette Dover, MD;  Location: [facility];  Service: General;  Laterality: Bilateral;  . INGUINAL HERNIA REPAIR Bilateral 09/22/11  . JOINT REPLACEMENT    . KNEE ARTHROSCOPY Right ~ 2011  . KNEE ARTHROSCOPY Left 11/13/2012   Procedure: ARTHROSCOPY KNEE;  Surgeon: Lorn Junes, MD;  Location: [place];  Service: Orthopedics;  Laterality: Left;  . LUNG BIOPSY  2011  . MASS EXCISION Right 04/12/2014   Procedure: MASS EXCISION DORSUM OF RIGHT FOOT  ;  Surgeon: Tobi Bastos, MD;  Location: [facility];  Service: Orthopedics;  Laterality: Right;  . NEPHRECTOMY Right 08/14/2009  . ORCHIECTOMY  09/22/2011   Procedure: ORCHIECTOMY;  Surgeon: Franchot Gallo, MD;  Location: [facility];  Service: Urology;  Laterality: Left;  Left Testicular Exploration, Inguinal Approach,  Testicular   . PILONIDAL CYST DRAINAGE    . TEE WITHOUT CARDIOVERSION N/A 10/08/2013   Procedure: TRANSESOPHAGEAL ECHOCARDIOGRAM (TEE);  Surgeon: Dorothy Spark, MD;  [facility]  ENDOSCOPY;  Service: Cardiovascular;  Laterality: N/A;  . TONSILLECTOMY AND ADENOIDECTOMY  1948  . TOTAL KNEE ARTHROPLASTY Left 11/13/2012   Procedure: TOTAL KNEE ARTHROPLASTY;  Surgeon: Lorn Junes, MD;  Location: [place];  Service: Orthopedics;  Laterality: Left;  . TOTAL KNEE ARTHROPLASTY Right 07/22/2014   Procedure: RIGHT TOTAL KNEE ARTHROPLASTY;  Surgeon: Elsie Saas, MD;  Location: [place];  Service: Orthopedics;  Laterality: Right;  . TOTAL SHOULDER REPLACEMENT Right 1997    Current Medications: Current Meds  Medication Sig  . atorvastatin (LIPITOR) 10 MG tablet Take 10 mg by mouth  daily with breakfast.   . cetirizine (ZYRTEC) 10 MG tablet Take 10 mg by mouth daily. As needed  Alternates monthly between this and Allegra Pt currently taking Allegra  . cholecalciferol (VITAMIN D) 1000 UNITS tablet Take 1,000 Units by mouth daily.  . clindamycin (CLEOCIN) 150 MG capsule Take 4 capsules by mouth as needed. (Take one (1) hour prior to dental procedures)  . fluticasone (FLONASE) 50 MCG/ACT nasal spray Place 1 spray into both nostrils daily.  . magnesium gluconate (MAGONATE) 500 MG tablet Take 500 mg by mouth 2 (two) times daily.   . metoprolol tartrate (LOPRESSOR) 25 MG tablet Take 1 tablet (25 mg total) by mouth 2 (two) times daily.  . Multiple Vitamin (MULTIVITAMIN WITH MINERALS) TABS Take 1 tablet by mouth daily.  . nitroGLYCERIN (NITROSTAT) 0.4 MG SL tablet Place 0.4 mg under the tongue every 5 (five) minutes as needed for chest pain.  Vladimir Faster Glycol-Propyl Glycol (SYSTANE) 0.4-0.3 % SOLN Place 1 drop into both eyes daily as needed (dry eyes).  . rivaroxaban (XARELTO) 20 MG TABS tablet Take 1 tablet (20 mg total) by mouth daily.  Marland Kitchen telmisartan (MICARDIS) 80 MG tablet Take 80 mg by mouth every evening.  . vitamin C (ASCORBIC ACID) 500 MG tablet Take 500 mg by mouth daily.      Allergies:   Penicillins, Ambien [zolpidem tartrate], Codeine, and Lisinopril   Social History   Socioeconomic History  . Marital status: Married    Spouse name: Not on file  . Number of children: Not on file  . Years of education: Not on file  . Highest education level: Not on file  Occupational History  . Not on file  Social Needs  . Financial resource strain: Not on file  . Food insecurity    Worry: Not on file    Inability: Not on file  . Transportation needs    Medical: Not on file    Non-medical: Not on file  Tobacco Use  . Smoking status: Former Smoker    Packs/day: 1.00    Years: 2.00    Pack years: 2.00    Types: Cigarettes    Quit date: 05/03/1961    Years since  quitting: 57.5  . Smokeless tobacco: Never Used  Substance and Sexual Activity  . Alcohol use: Yes    Alcohol/week: 1.0 standard drinks    Types: 1 Glasses of wine per week    Comment: occas glass of wine every 2 wks  . Drug use: No  . Sexual activity: Not Currently  Lifestyle  . Physical activity    Days per week: Not on file    Minutes per session: Not on file  . Stress: Not on file  Relationships  . Social Herbalist on phone: Not on file    Gets together: Not on file    Attends religious service: Not on file  Active member of club or organization: Not on file    Attends meetings of clubs or organizations: Not on file    Relationship status: Not on file  Other Topics Concern  . Not on file  Social History Narrative   Retired English as a second language teacher.   Lives with wife.     Family History: The patient's family history includes Dementia in his mother; Healthy in his daughter; Heart disease in his father; Hypertension in his father; Skin cancer in his son; Thyroid disease in his brother and mother.  ROS:   Please see the history of present illness.     All other systems reviewed and are negative.  EKGs/Labs/Other Studies Reviewed:    The following studies were reviewed today: None  EKG:  EKG sinus bradycardia with premature ventricular contraction.  Recent Labs: 10/06/2018: Hemoglobin 14.7; Platelets 186  Recent Lipid Panel    Component Value Date/Time   CHOL 137 07/20/2017 1015   TRIG 190 (H) 07/20/2017 1015   HDL 35 (L) 07/20/2017 1015   CHOLHDL 3.9 07/20/2017 1015   CHOLHDL 3.1 10/06/2013 0442   VLDL 15 10/06/2013 0442   LDLCALC 64 07/20/2017 1015    Physical Exam:    VS:  BP (!) 142/78   Pulse (!) 58   Ht 5\' 11"  (1.803 m)   Wt 214 lb 12.8 oz (97.4 kg)   SpO2 97%   BMI 29.96 kg/m     Wt Readings from Last 3 Encounters:  10/24/18 214 lb 12.8 oz (97.4 kg)  07/20/17 216 lb 12.8 oz (98.3 kg)  04/19/17 212 lb 12.8 oz (96.5 kg)     GEN: . No acute  distress HEENT: Normal NECK: No JVD. LYMPHATICS: No lymphadenopathy CARDIAC: RRR.  no murmur, nogallop, noedema VASCULAR: 2+ bilateral pulses, no bruits RESPIRATORY:  Clear to auscultation without rales, wheezing or rhonchi  ABDOMEN: Soft, non-tender, non-distended, No pulsatile mass, MUSCULOSKELETAL: No deformity  SKIN: Warm and dry NEUROLOGIC:  Alert and oriented x 3 PSYCHIATRIC:  Normal affect   ASSESSMENT:    1. Coronary artery disease involving native coronary artery of native heart with angina pectoris (HCC)   2. Paroxysmal atrial fibrillation ([place])   3. Chronic diastolic heart failure ([place])   4. Essential hypertension   5. Other hyperlipidemia   6. Anticoagulated- to go to Xarelto for a fib   7. Old MI (myocardial infarction)   8. Educated About Covid-19 Virus Infection    PLAN:    In order of problems listed above:  1. CAD stable without angina or dyspnea.  Recommend increase aerobic activity.  Since he has had prior difficulty with diastolic heart failure, atrial fibrillation, and is diabetic SGLT2 therapy could also provide added protection. 2. No symptomatic episodes.  Chads VASC is greater than 2. 3. No evidence of volume overload.  SGLT2.  Protect against development of symptomatic disease. 4. Target blood pressure 130/80 mmHg. 5. Most recent LDL was 79.  Increased activity and decrease caloric saturated fat intake. 6. Continue Xarelto.  Overall education and awareness concerning primary/secondary risk prevention was discussed in detail: LDL less than 70, hemoglobin A1c less than 7, blood pressure target less than 130/80 mmHg, >150 minutes of moderate aerobic activity per week, avoidance of smoking, weight control (via diet and exercise), and continued surveillance/management of/for obstructive sleep apnea.    Medication Adjustments/Labs and Tests Ordered: Current medicines are reviewed at length with the patient today.  Concerns regarding medicines are outlined  above.  No orders of the defined  types were placed in this encounter.  No orders of the defined types were placed in this encounter.   There are no Patient Instructions on file for this visit.   Signed, Sinclair Grooms, MD  10/24/2018 10:07 AM    [place]

## 2018-10-24 ENCOUNTER — Ambulatory Visit: Payer: Medicare HMO | Admitting: Interventional Cardiology

## 2018-10-24 ENCOUNTER — Encounter: Payer: Self-pay | Admitting: Interventional Cardiology

## 2018-10-24 ENCOUNTER — Other Ambulatory Visit: Payer: Self-pay

## 2018-10-24 VITALS — BP 142/78 | HR 58 | Ht 71.0 in | Wt 214.8 lb

## 2018-10-24 DIAGNOSIS — I10 Essential (primary) hypertension: Secondary | ICD-10-CM

## 2018-10-24 DIAGNOSIS — Z7901 Long term (current) use of anticoagulants: Secondary | ICD-10-CM | POA: Diagnosis not present

## 2018-10-24 DIAGNOSIS — I5032 Chronic diastolic (congestive) heart failure: Secondary | ICD-10-CM | POA: Diagnosis not present

## 2018-10-24 DIAGNOSIS — I25119 Atherosclerotic heart disease of native coronary artery with unspecified angina pectoris: Secondary | ICD-10-CM

## 2018-10-24 DIAGNOSIS — I480 Paroxysmal atrial fibrillation: Secondary | ICD-10-CM

## 2018-10-24 DIAGNOSIS — I252 Old myocardial infarction: Secondary | ICD-10-CM | POA: Diagnosis not present

## 2018-10-24 DIAGNOSIS — E7849 Other hyperlipidemia: Secondary | ICD-10-CM | POA: Diagnosis not present

## 2018-10-24 DIAGNOSIS — Z7189 Other specified counseling: Secondary | ICD-10-CM | POA: Diagnosis not present

## 2018-10-24 NOTE — Patient Instructions (Signed)

## 2018-10-27 DIAGNOSIS — R69 Illness, unspecified: Secondary | ICD-10-CM | POA: Diagnosis not present

## 2018-11-10 ENCOUNTER — Ambulatory Visit: Payer: Medicare HMO | Admitting: Interventional Cardiology

## 2018-11-14 DIAGNOSIS — N183 Chronic kidney disease, stage 3 (moderate): Secondary | ICD-10-CM | POA: Diagnosis not present

## 2018-11-14 DIAGNOSIS — I10 Essential (primary) hypertension: Secondary | ICD-10-CM | POA: Diagnosis not present

## 2018-11-14 DIAGNOSIS — J302 Other seasonal allergic rhinitis: Secondary | ICD-10-CM | POA: Diagnosis not present

## 2018-11-14 DIAGNOSIS — E1142 Type 2 diabetes mellitus with diabetic polyneuropathy: Secondary | ICD-10-CM | POA: Diagnosis not present

## 2018-11-14 DIAGNOSIS — Z7984 Long term (current) use of oral hypoglycemic drugs: Secondary | ICD-10-CM | POA: Diagnosis not present

## 2019-01-23 DIAGNOSIS — E119 Type 2 diabetes mellitus without complications: Secondary | ICD-10-CM | POA: Diagnosis not present

## 2019-01-23 DIAGNOSIS — H52203 Unspecified astigmatism, bilateral: Secondary | ICD-10-CM | POA: Diagnosis not present

## 2019-01-23 DIAGNOSIS — H5203 Hypermetropia, bilateral: Secondary | ICD-10-CM | POA: Diagnosis not present

## 2019-01-23 DIAGNOSIS — H2513 Age-related nuclear cataract, bilateral: Secondary | ICD-10-CM | POA: Diagnosis not present

## 2019-02-06 DIAGNOSIS — L720 Epidermal cyst: Secondary | ICD-10-CM | POA: Diagnosis not present

## 2019-02-08 DIAGNOSIS — Z23 Encounter for immunization: Secondary | ICD-10-CM | POA: Diagnosis not present

## 2019-02-16 DIAGNOSIS — N400 Enlarged prostate without lower urinary tract symptoms: Secondary | ICD-10-CM | POA: Diagnosis not present

## 2019-02-16 DIAGNOSIS — I480 Paroxysmal atrial fibrillation: Secondary | ICD-10-CM | POA: Diagnosis not present

## 2019-02-16 DIAGNOSIS — I10 Essential (primary) hypertension: Secondary | ICD-10-CM | POA: Diagnosis not present

## 2019-02-16 DIAGNOSIS — N183 Chronic kidney disease, stage 3 unspecified: Secondary | ICD-10-CM | POA: Diagnosis not present

## 2019-02-16 DIAGNOSIS — M159 Polyosteoarthritis, unspecified: Secondary | ICD-10-CM | POA: Diagnosis not present

## 2019-02-16 DIAGNOSIS — E1142 Type 2 diabetes mellitus with diabetic polyneuropathy: Secondary | ICD-10-CM | POA: Diagnosis not present

## 2019-02-16 DIAGNOSIS — E7800 Pure hypercholesterolemia, unspecified: Secondary | ICD-10-CM | POA: Diagnosis not present

## 2019-02-16 DIAGNOSIS — I2510 Atherosclerotic heart disease of native coronary artery without angina pectoris: Secondary | ICD-10-CM | POA: Diagnosis not present

## 2019-03-27 DIAGNOSIS — I480 Paroxysmal atrial fibrillation: Secondary | ICD-10-CM | POA: Diagnosis not present

## 2019-03-27 DIAGNOSIS — I2510 Atherosclerotic heart disease of native coronary artery without angina pectoris: Secondary | ICD-10-CM | POA: Diagnosis not present

## 2019-03-27 DIAGNOSIS — N400 Enlarged prostate without lower urinary tract symptoms: Secondary | ICD-10-CM | POA: Diagnosis not present

## 2019-03-27 DIAGNOSIS — I10 Essential (primary) hypertension: Secondary | ICD-10-CM | POA: Diagnosis not present

## 2019-03-27 DIAGNOSIS — E1142 Type 2 diabetes mellitus with diabetic polyneuropathy: Secondary | ICD-10-CM | POA: Diagnosis not present

## 2019-03-27 DIAGNOSIS — N183 Chronic kidney disease, stage 3 unspecified: Secondary | ICD-10-CM | POA: Diagnosis not present

## 2019-03-27 DIAGNOSIS — E7800 Pure hypercholesterolemia, unspecified: Secondary | ICD-10-CM | POA: Diagnosis not present

## 2019-03-27 DIAGNOSIS — M159 Polyosteoarthritis, unspecified: Secondary | ICD-10-CM | POA: Diagnosis not present

## 2019-05-14 ENCOUNTER — Ambulatory Visit: Payer: Medicare Other | Attending: Internal Medicine

## 2019-05-14 DIAGNOSIS — Z23 Encounter for immunization: Secondary | ICD-10-CM | POA: Insufficient documentation

## 2019-05-14 NOTE — Progress Notes (Signed)
   Covid-19 Vaccination Clinic  Name:  Donald Gray    MRN: [id number] DOB: April 17, 1941  05/14/2019  [name] was observed post Covid-19 immunization for 30 minutes based on pre-vaccination screening without incidence. He was provided with Vaccine Information Sheet and instruction to access the V-Safe system.   [name] was instructed to call 911 with any severe reactions post vaccine: Marland Kitchen Difficulty breathing  . Swelling of your face and throat  . A fast heartbeat  . A bad rash all over your body  . Dizziness and weakness    Immunizations Administered    Name Date Dose VIS Date Route   Pfizer COVID-19 Vaccine 05/14/2019 11:49 AM 0.3 mL 04/13/2019 Intramuscular   Manufacturer: [place]   Lot: [id number]   [place]: [id number]

## 2019-05-22 DIAGNOSIS — I2510 Atherosclerotic heart disease of native coronary artery without angina pectoris: Secondary | ICD-10-CM | POA: Diagnosis not present

## 2019-05-22 DIAGNOSIS — I10 Essential (primary) hypertension: Secondary | ICD-10-CM | POA: Diagnosis not present

## 2019-05-22 DIAGNOSIS — G609 Hereditary and idiopathic neuropathy, unspecified: Secondary | ICD-10-CM | POA: Diagnosis not present

## 2019-05-22 DIAGNOSIS — Z85528 Personal history of other malignant neoplasm of kidney: Secondary | ICD-10-CM | POA: Diagnosis not present

## 2019-05-22 DIAGNOSIS — Z0000 Encounter for general adult medical examination without abnormal findings: Secondary | ICD-10-CM | POA: Diagnosis not present

## 2019-05-22 DIAGNOSIS — E1142 Type 2 diabetes mellitus with diabetic polyneuropathy: Secondary | ICD-10-CM | POA: Diagnosis not present

## 2019-05-22 DIAGNOSIS — N1831 Chronic kidney disease, stage 3a: Secondary | ICD-10-CM | POA: Diagnosis not present

## 2019-05-22 DIAGNOSIS — E7800 Pure hypercholesterolemia, unspecified: Secondary | ICD-10-CM | POA: Diagnosis not present

## 2019-05-22 DIAGNOSIS — Z1389 Encounter for screening for other disorder: Secondary | ICD-10-CM | POA: Diagnosis not present

## 2019-05-22 DIAGNOSIS — I480 Paroxysmal atrial fibrillation: Secondary | ICD-10-CM | POA: Diagnosis not present

## 2019-05-25 DIAGNOSIS — D1801 Hemangioma of skin and subcutaneous tissue: Secondary | ICD-10-CM | POA: Diagnosis not present

## 2019-05-25 DIAGNOSIS — D225 Melanocytic nevi of trunk: Secondary | ICD-10-CM | POA: Diagnosis not present

## 2019-05-25 DIAGNOSIS — L814 Other melanin hyperpigmentation: Secondary | ICD-10-CM | POA: Diagnosis not present

## 2019-05-25 DIAGNOSIS — L821 Other seborrheic keratosis: Secondary | ICD-10-CM | POA: Diagnosis not present

## 2019-05-25 DIAGNOSIS — L720 Epidermal cyst: Secondary | ICD-10-CM | POA: Diagnosis not present

## 2019-05-25 DIAGNOSIS — D2261 Melanocytic nevi of right upper limb, including shoulder: Secondary | ICD-10-CM | POA: Diagnosis not present

## 2019-05-25 DIAGNOSIS — D2262 Melanocytic nevi of left upper limb, including shoulder: Secondary | ICD-10-CM | POA: Diagnosis not present

## 2019-05-30 DIAGNOSIS — C641 Malignant neoplasm of right kidney, except renal pelvis: Secondary | ICD-10-CM | POA: Diagnosis not present

## 2019-05-30 DIAGNOSIS — Z905 Acquired absence of kidney: Secondary | ICD-10-CM | POA: Diagnosis not present

## 2019-05-30 DIAGNOSIS — Z85528 Personal history of other malignant neoplasm of kidney: Secondary | ICD-10-CM | POA: Diagnosis not present

## 2019-06-02 ENCOUNTER — Ambulatory Visit: Payer: Medicare HMO | Attending: Internal Medicine

## 2019-06-02 DIAGNOSIS — Z23 Encounter for immunization: Secondary | ICD-10-CM | POA: Insufficient documentation

## 2019-06-02 NOTE — Progress Notes (Signed)
   Covid-19 Vaccination Clinic  Name:  Donald Gray    MRN: [id number] DOB: 1940/11/11  06/02/2019  Donald Gray was observed post Covid-19 immunization for 15 minutes without incidence. He was provided with Vaccine Information Sheet and instruction to access the V-Safe system.   Donald Gray was instructed to call 911 with any severe reactions post vaccine: Marland Kitchen Difficulty breathing  . Swelling of your face and throat  . A fast heartbeat  . A bad rash all over your body  . Dizziness and weakness    Immunizations Administered    Name Date Dose VIS Date Route   Pfizer COVID-19 Vaccine 06/02/2019 11:49 AM 0.3 mL 04/13/2019 Intramuscular   Manufacturer: [place]   Lot: [id number]   [place]: [id number]

## 2019-06-11 DIAGNOSIS — R69 Illness, unspecified: Secondary | ICD-10-CM | POA: Diagnosis not present

## 2019-06-29 ENCOUNTER — Ambulatory Visit: Payer: Medicare HMO

## 2019-07-24 DIAGNOSIS — I480 Paroxysmal atrial fibrillation: Secondary | ICD-10-CM | POA: Diagnosis not present

## 2019-07-24 DIAGNOSIS — I10 Essential (primary) hypertension: Secondary | ICD-10-CM | POA: Diagnosis not present

## 2019-07-24 DIAGNOSIS — E7800 Pure hypercholesterolemia, unspecified: Secondary | ICD-10-CM | POA: Diagnosis not present

## 2019-07-24 DIAGNOSIS — I2510 Atherosclerotic heart disease of native coronary artery without angina pectoris: Secondary | ICD-10-CM | POA: Diagnosis not present

## 2019-07-24 DIAGNOSIS — N1831 Chronic kidney disease, stage 3a: Secondary | ICD-10-CM | POA: Diagnosis not present

## 2019-07-24 DIAGNOSIS — N400 Enlarged prostate without lower urinary tract symptoms: Secondary | ICD-10-CM | POA: Diagnosis not present

## 2019-07-24 DIAGNOSIS — E1142 Type 2 diabetes mellitus with diabetic polyneuropathy: Secondary | ICD-10-CM | POA: Diagnosis not present

## 2019-07-24 DIAGNOSIS — M159 Polyosteoarthritis, unspecified: Secondary | ICD-10-CM | POA: Diagnosis not present

## 2019-08-26 NOTE — Progress Notes (Signed)
Cardiology Office Note:    Date:  08/27/2019   ID:  UNKNOWN MANGANELLI, DOB 1941/04/02, MRN [id number]  PCP:  Lavone Orn, MD  Cardiologist:  Sinclair Grooms, MD   Referring MD: Lavone Orn, MD   Chief Complaint  Patient presents with  . Coronary Artery Disease    History of Present Illness:    Donald Gray is a 79 y.o. male with a hx of  with a hx of CAD with remote stent inRCA1995, obesity, diet controlled DM, HLD, HTN, renal carcinomatreated with targeted chemotherapy,and PAF wiithCHADS-VASC score of4.  He has been sheltering in for nearly a year.  He complains that now, when he tries to do physical activities, he gets short of breath and gives out faster than what he recalls previously.  With physical activity and are at rest, he is having no chest discomfort, arm discomfort, or anginal equivalent.  He has not had significant palpitations or syncope.  Past Medical History:  Diagnosis Date  . Anemia   . Anticoagulated- to go to Xarelto for a fib 10/07/2013  . Arthritis    "fingers, toes" (10/05/2013)  . Coronary artery disease    a. s/p MI with stent in 2000  . Diverticulosis   . Dysrhythmia   . GERD (gastroesophageal reflux disease)   . History of blood transfusion    "w/nephrectomy; w/shoulder replacement; w/knee replacement"  . Hyperlipidemia   . Hypertension   . Left knee DJD   . Myocardial infarction ([place]) 1997  . Neuropathy    feet bilat   . NSTEMI (non-ST elevated myocardial infarction), type 2 due to demand ischemia 10/07/2013  . PAF (paroxysmal atrial fibrillation) ([place])   . Pneumonia ~ 2007   again 3 months ago   . Primary localized osteoarthritis of right knee   . Renal cell carcinoma ([place])    a. s/p nephrectomy  . Shortness of breath    a. related to chemo drugs  . Tendonitis    currently on prednisone and wearing brace (left wrist)  . Type II diabetes mellitus ([place])    "treating w/diet and weight loss"    Past Surgical  History:  Procedure Laterality Date  . CARDIAC CATHETERIZATION  05/2009  . CARDIOVERSION N/A 10/08/2013   Procedure: CARDIOVERSION;  Surgeon: Dorothy Spark, MD;  Location: [facility];  Service: Cardiovascular;  Laterality: N/A;  . CHEILECTOMY Right 04/13/2013   Procedure: Right Great Toe Cheilectomy, Debride Tendon and Abscess, Apply Stimulan Beads;  Surgeon: Newt Minion, MD;  Location: [facility];  Service: Orthopedics;  Laterality: Right;  Right Great Toe Cheilectomy, Debride Tendon and Abscess, Apply Stimulan Beads  . colonscopy     . [place]   at [facility] to RCA   . INGUINAL HERNIA REPAIR  09/22/2011   Procedure: HERNIA REPAIR INGUINAL ADULT BILATERAL;  Surgeon: Imogene Burn. Georgette Dover, MD;  Location: [facility];  Service: General;  Laterality: Bilateral;  . INGUINAL HERNIA REPAIR Bilateral 09/22/11  . JOINT REPLACEMENT    . KNEE ARTHROSCOPY Right ~ 2011  . KNEE ARTHROSCOPY Left 11/13/2012   Procedure: ARTHROSCOPY KNEE;  Surgeon: Lorn Junes, MD;  Location: [facility];  Service: Orthopedics;  Laterality: Left;  . LUNG BIOPSY  2011  . MASS EXCISION Right 04/12/2014   Procedure: MASS EXCISION DORSUM OF RIGHT FOOT  ;  Surgeon: Tobi Bastos, MD;  Location: [facility];  Service: Orthopedics;  Laterality: Right;  . NEPHRECTOMY Right 08/14/2009  .  ORCHIECTOMY  09/22/2011   Procedure: ORCHIECTOMY;  Surgeon: Franchot Gallo, MD;  Location: [facility];  Service: Urology;  Laterality: Left;  Left Testicular Exploration, Inguinal Approach,  Testicular   . PILONIDAL CYST DRAINAGE    . TEE WITHOUT CARDIOVERSION N/A 10/08/2013   Procedure: TRANSESOPHAGEAL ECHOCARDIOGRAM (TEE);  Surgeon: Dorothy Spark, MD;  Location: [facility];  Service: Cardiovascular;  Laterality: N/A;  . [place]  . TOTAL KNEE ARTHROPLASTY Left 11/13/2012   Procedure: TOTAL KNEE ARTHROPLASTY;  Surgeon: Lorn Junes, MD;  Location: [facility];  Service: Orthopedics;  Laterality:  Left;  . TOTAL KNEE ARTHROPLASTY Right 07/22/2014   Procedure: RIGHT TOTAL KNEE ARTHROPLASTY;  Surgeon: Elsie Saas, MD;  Location: [facility];  Service: Orthopedics;  Laterality: Right;  . TOTAL SHOULDER REPLACEMENT Right 1997    Current Medications: Current Meds  Medication Sig  . atorvastatin (LIPITOR) 10 MG tablet Take 10 mg by mouth daily with breakfast.   . cetirizine (ZYRTEC) 10 MG tablet Take 10 mg by mouth daily. As needed  Alternates monthly between this and Allegra Pt currently taking Allegra  . cholecalciferol (VITAMIN D) 1000 UNITS tablet Take 1,000 Units by mouth daily.  . clindamycin (CLEOCIN) 150 MG capsule Take 4 capsules by mouth as needed. (Take one (1) hour prior to dental procedures)  . fluticasone (FLONASE) 50 MCG/ACT nasal spray Place 1 spray into both nostrils daily.  . magnesium gluconate (MAGONATE) 500 MG tablet Take 500 mg by mouth 2 (two) times daily.   . metoprolol tartrate (LOPRESSOR) 25 MG tablet Take 1 tablet (25 mg total) by mouth 2 (two) times daily.  . Multiple Vitamin (MULTIVITAMIN WITH MINERALS) TABS Take 1 tablet by mouth daily.  . nitroGLYCERIN (NITROSTAT) 0.4 MG SL tablet Place 0.4 mg under the tongue every 5 (five) minutes as needed for chest pain.  Vladimir Faster Glycol-Propyl Glycol (SYSTANE) 0.4-0.3 % SOLN Place 1 drop into both eyes daily as needed (dry eyes).  . rivaroxaban (XARELTO) 20 MG TABS tablet Take 1 tablet (20 mg total) by mouth daily.  Marland Kitchen telmisartan (MICARDIS) 80 MG tablet Take 80 mg by mouth every evening.  . vitamin C (ASCORBIC ACID) 500 MG tablet Take 500 mg by mouth daily.      Allergies:   Penicillins, Ambien [zolpidem tartrate], Codeine, and Lisinopril   Social History   Socioeconomic History  . Marital status: Married    Spouse name: Not on file  . Number of children: Not on file  . Years of education: Not on file  . Highest education level: Not on file  Occupational History  . Not on file  Tobacco Use  . Smoking status:  Former Smoker    Packs/day: 1.00    Years: 2.00    Pack years: 2.00    Types: Cigarettes    Quit date: 05/03/1961    Years since quitting: 58.3  . Smokeless tobacco: Never Used  Substance and Sexual Activity  . Alcohol use: Yes    Alcohol/week: 1.0 standard drinks    Types: 1 Glasses of wine per week    Comment: occas glass of wine every 2 wks  . Drug use: No  . Sexual activity: Not Currently  Other Topics Concern  . Not on file  Social History Narrative   Retired English as a second language teacher.   Lives with wife.   Social Determinants of Health   Financial Resource Strain:   . Difficulty of Paying Living Expenses:   Food Insecurity:   . Worried  About Running Out of Food in the Last Year:   . [place] in the Last Year:   Transportation Needs:   . Lack of Transportation (Medical):   Marland Kitchen Lack of Transportation (Non-Medical):   Physical Activity:   . Days of Exercise per Week:   . Minutes of Exercise per Session:   Stress:   . Feeling of Stress :   Social Connections:   . Frequency of Communication with Friends and Family:   . Frequency of Social Gatherings with Friends and Family:   . Attends Religious Services:   . Active Member of Clubs or Organizations:   . Attends Archivist Meetings:   Marland Kitchen Marital Status:      Family History: The patient's family history includes Dementia in his mother; Healthy in his daughter; Heart disease in his father; Hypertension in his father; Skin cancer in his son; Thyroid disease in his brother and mother.  ROS:   Please see the history of present illness.    Difficulty ambulating because of peripheral neuropathy that makes him feel off balance and that he may fall.  Fortunately no significant fall or injury.  All other systems reviewed and are negative.  EKGs/Labs/Other Studies Reviewed:    The following studies were reviewed today: No new data  EKG:  EKG sinus bradycardia, poor R wave progression V1 through V3 when compared to the prior  tracing from June 2020, no significant changes noted.  Recent Labs: 10/06/2018: Hemoglobin 14.7; Platelets 186  Recent Lipid Panel    Component Value Date/Time   CHOL 137 07/20/2017 1015   TRIG 190 (H) 07/20/2017 1015   HDL 35 (L) 07/20/2017 1015   CHOLHDL 3.9 07/20/2017 1015   CHOLHDL 3.1 10/06/2013 0442   VLDL 15 10/06/2013 0442   LDLCALC 64 07/20/2017 1015    Physical Exam:    VS:  BP 128/72   Pulse (!) 58   Ht 5\' 11"  (1.803 m)   Wt 212 lb 1.9 oz (96.2 kg)   SpO2 96%   BMI 29.58 kg/m     Wt Readings from Last 3 Encounters:  08/27/19 212 lb 1.9 oz (96.2 kg)  10/24/18 214 lb 12.8 oz (97.4 kg)  07/20/17 216 lb 12.8 oz (98.3 kg)     GEN: Weight is been stable and he is not pale.. No acute distress HEENT: Normal NECK: No JVD. LYMPHATICS: No lymphadenopathy CARDIAC:  RRR without murmur, gallop, or edema. VASCULAR:  Normal Pulses. No bruits. RESPIRATORY:  Clear to auscultation without rales, wheezing or rhonchi  ABDOMEN: Soft, non-tender, non-distended, No pulsatile mass, MUSCULOSKELETAL: No deformity  SKIN: Warm and dry NEUROLOGIC:  Alert and oriented x 3 PSYCHIATRIC:  Normal affect   ASSESSMENT:    1. Coronary artery disease involving native coronary artery of native heart with angina pectoris (HCC)   2. Paroxysmal atrial fibrillation ([place])   3. Chronic diastolic heart failure ([place])   4. Essential hypertension   5. Other hyperlipidemia   6. Anticoagulated- to go to Xarelto for a fib   7. Educated about COVID-19 virus infection    PLAN:    In order of problems listed above:  1. I believe coronary disease is stable.  He is certainly not having angina.  Secondary prevention discussed as noted below.  I do not believe functional assessment is needed unless he begins experiencing angina. 2. He denies palpitations.  No clinical documentation of atrial fibrillation recently. 3. Limit sodium in diet.  Let us know  if lower extremity swelling or orthopnea develops.     4. Continue metoprolol and Micardis. 5. Continue low-dose Lipitor 10 mg/day. 6. Continue Xarelto.  Notify if bleeding. 7. Continues to practice social distancing.  Overall education and awareness concerning primary/secondary risk prevention was discussed in detail: LDL less than 70, hemoglobin A1c less than 7, blood pressure target less than 130/80 mmHg, >150 minutes of moderate aerobic activity per week, avoidance of smoking, weight control (via diet and exercise), and continued surveillance/management of/for obstructive sleep apnea.    Medication Adjustments/Labs and Tests Ordered: Current medicines are reviewed at length with the patient today.  Concerns regarding medicines are outlined above.  Orders Placed This Encounter  Procedures  . EKG 12-Lead   No orders of the defined types were placed in this encounter.   There are no Patient Instructions on file for this visit.   Signed, Sinclair Grooms, MD  08/27/2019 10:01 AM    [place]

## 2019-08-27 ENCOUNTER — Ambulatory Visit: Payer: Medicare HMO | Admitting: Interventional Cardiology

## 2019-08-27 ENCOUNTER — Other Ambulatory Visit: Payer: Self-pay

## 2019-08-27 ENCOUNTER — Encounter: Payer: Self-pay | Admitting: Interventional Cardiology

## 2019-08-27 VITALS — BP 128/72 | HR 58 | Ht 71.0 in | Wt 212.1 lb

## 2019-08-27 DIAGNOSIS — I25119 Atherosclerotic heart disease of native coronary artery with unspecified angina pectoris: Secondary | ICD-10-CM

## 2019-08-27 DIAGNOSIS — I10 Essential (primary) hypertension: Secondary | ICD-10-CM | POA: Diagnosis not present

## 2019-08-27 DIAGNOSIS — I480 Paroxysmal atrial fibrillation: Secondary | ICD-10-CM

## 2019-08-27 DIAGNOSIS — Z7189 Other specified counseling: Secondary | ICD-10-CM | POA: Diagnosis not present

## 2019-08-27 DIAGNOSIS — Z7901 Long term (current) use of anticoagulants: Secondary | ICD-10-CM | POA: Diagnosis not present

## 2019-08-27 DIAGNOSIS — I5032 Chronic diastolic (congestive) heart failure: Secondary | ICD-10-CM | POA: Diagnosis not present

## 2019-08-27 DIAGNOSIS — E7849 Other hyperlipidemia: Secondary | ICD-10-CM | POA: Diagnosis not present

## 2019-08-27 NOTE — Patient Instructions (Signed)
Medication Instructions:  Your physician recommends that you continue on your current medications as directed. Please refer to the Current Medication list given to you today.  *If you need a refill on your cardiac medications before your next appointment, please call your pharmacy*   Lab Work: None If you have labs (blood work) drawn today and your tests are completely normal, you will receive your results only by: . MyChart Message (if you have MyChart) OR . A paper copy in the mail If you have any lab test that is abnormal or we need to change your treatment, we will call you to review the results.   Testing/Procedures: None   Follow-Up: At [facility], you and your health needs are our priority.  As part of our continuing mission to provide you with exceptional heart care, we have created designated Provider Care Teams.  These Care Teams include your primary Cardiologist (physician) and Advanced Practice Providers (APPs -  Physician Assistants and Nurse Practitioners) who all work together to provide you with the care you need, when you need it.  We recommend signing up for the patient portal called "MyChart".  Sign up information is provided on this After Visit Summary.  MyChart is used to connect with patients for Virtual Visits (Telemedicine).  Patients are able to view lab/test results, encounter notes, upcoming appointments, etc.  Non-urgent messages can be sent to your provider as well.   To learn more about what you can do with MyChart, go to [email or web address].    Your next appointment:   12 month(s)  The format for your next appointment:   In Person  Provider:   You may see Henry W Smith III, MD or one of the following Advanced Practice Providers on your designated Care Team:    Lori Gerhardt, NP  Laura Ingold, NP  Jill McDaniel, NP    Other Instructions  Your provider recommends that you maintain 150 minutes per week of moderate aerobic activity.   

## 2019-10-11 DIAGNOSIS — R69 Illness, unspecified: Secondary | ICD-10-CM | POA: Diagnosis not present

## 2019-11-07 ENCOUNTER — Ambulatory Visit: Payer: Medicare HMO | Admitting: Interventional Cardiology

## 2019-11-11 DIAGNOSIS — E7800 Pure hypercholesterolemia, unspecified: Secondary | ICD-10-CM | POA: Diagnosis not present

## 2019-11-11 DIAGNOSIS — N1831 Chronic kidney disease, stage 3a: Secondary | ICD-10-CM | POA: Diagnosis not present

## 2019-11-11 DIAGNOSIS — E1142 Type 2 diabetes mellitus with diabetic polyneuropathy: Secondary | ICD-10-CM | POA: Diagnosis not present

## 2019-11-11 DIAGNOSIS — I10 Essential (primary) hypertension: Secondary | ICD-10-CM | POA: Diagnosis not present

## 2019-11-11 DIAGNOSIS — N400 Enlarged prostate without lower urinary tract symptoms: Secondary | ICD-10-CM | POA: Diagnosis not present

## 2019-11-11 DIAGNOSIS — I480 Paroxysmal atrial fibrillation: Secondary | ICD-10-CM | POA: Diagnosis not present

## 2019-11-11 DIAGNOSIS — M159 Polyosteoarthritis, unspecified: Secondary | ICD-10-CM | POA: Diagnosis not present

## 2019-11-11 DIAGNOSIS — N183 Chronic kidney disease, stage 3 unspecified: Secondary | ICD-10-CM | POA: Diagnosis not present

## 2019-11-11 DIAGNOSIS — I2510 Atherosclerotic heart disease of native coronary artery without angina pectoris: Secondary | ICD-10-CM | POA: Diagnosis not present

## 2019-11-27 DIAGNOSIS — R61 Generalized hyperhidrosis: Secondary | ICD-10-CM | POA: Diagnosis not present

## 2019-11-27 DIAGNOSIS — I10 Essential (primary) hypertension: Secondary | ICD-10-CM | POA: Diagnosis not present

## 2019-11-27 DIAGNOSIS — G609 Hereditary and idiopathic neuropathy, unspecified: Secondary | ICD-10-CM | POA: Diagnosis not present

## 2019-11-27 DIAGNOSIS — E1165 Type 2 diabetes mellitus with hyperglycemia: Secondary | ICD-10-CM | POA: Diagnosis not present

## 2019-11-27 DIAGNOSIS — R252 Cramp and spasm: Secondary | ICD-10-CM | POA: Diagnosis not present

## 2019-11-27 DIAGNOSIS — E1142 Type 2 diabetes mellitus with diabetic polyneuropathy: Secondary | ICD-10-CM | POA: Diagnosis not present

## 2019-11-27 DIAGNOSIS — E7800 Pure hypercholesterolemia, unspecified: Secondary | ICD-10-CM | POA: Diagnosis not present

## 2019-11-27 DIAGNOSIS — I480 Paroxysmal atrial fibrillation: Secondary | ICD-10-CM | POA: Diagnosis not present

## 2019-12-12 DIAGNOSIS — R69 Illness, unspecified: Secondary | ICD-10-CM | POA: Diagnosis not present

## 2020-01-24 DIAGNOSIS — E119 Type 2 diabetes mellitus without complications: Secondary | ICD-10-CM | POA: Diagnosis not present

## 2020-01-24 DIAGNOSIS — H2513 Age-related nuclear cataract, bilateral: Secondary | ICD-10-CM | POA: Diagnosis not present

## 2020-01-24 DIAGNOSIS — H52203 Unspecified astigmatism, bilateral: Secondary | ICD-10-CM | POA: Diagnosis not present

## 2020-01-24 DIAGNOSIS — H5203 Hypermetropia, bilateral: Secondary | ICD-10-CM | POA: Diagnosis not present

## 2020-03-04 DIAGNOSIS — R69 Illness, unspecified: Secondary | ICD-10-CM | POA: Diagnosis not present

## 2020-03-25 DIAGNOSIS — R69 Illness, unspecified: Secondary | ICD-10-CM | POA: Diagnosis not present

## 2020-04-28 DIAGNOSIS — N1831 Chronic kidney disease, stage 3a: Secondary | ICD-10-CM | POA: Diagnosis not present

## 2020-04-28 DIAGNOSIS — I2510 Atherosclerotic heart disease of native coronary artery without angina pectoris: Secondary | ICD-10-CM | POA: Diagnosis not present

## 2020-04-28 DIAGNOSIS — M25512 Pain in left shoulder: Secondary | ICD-10-CM | POA: Diagnosis not present

## 2020-04-28 DIAGNOSIS — M159 Polyosteoarthritis, unspecified: Secondary | ICD-10-CM | POA: Diagnosis not present

## 2020-04-28 DIAGNOSIS — N400 Enlarged prostate without lower urinary tract symptoms: Secondary | ICD-10-CM | POA: Diagnosis not present

## 2020-04-28 DIAGNOSIS — I10 Essential (primary) hypertension: Secondary | ICD-10-CM | POA: Diagnosis not present

## 2020-04-28 DIAGNOSIS — N183 Chronic kidney disease, stage 3 unspecified: Secondary | ICD-10-CM | POA: Diagnosis not present

## 2020-04-28 DIAGNOSIS — E7800 Pure hypercholesterolemia, unspecified: Secondary | ICD-10-CM | POA: Diagnosis not present

## 2020-04-28 DIAGNOSIS — I480 Paroxysmal atrial fibrillation: Secondary | ICD-10-CM | POA: Diagnosis not present

## 2020-04-28 DIAGNOSIS — E1142 Type 2 diabetes mellitus with diabetic polyneuropathy: Secondary | ICD-10-CM | POA: Diagnosis not present

## 2020-05-23 DIAGNOSIS — E7800 Pure hypercholesterolemia, unspecified: Secondary | ICD-10-CM | POA: Diagnosis not present

## 2020-05-23 DIAGNOSIS — I10 Essential (primary) hypertension: Secondary | ICD-10-CM | POA: Diagnosis not present

## 2020-05-23 DIAGNOSIS — N1831 Chronic kidney disease, stage 3a: Secondary | ICD-10-CM | POA: Diagnosis not present

## 2020-05-23 DIAGNOSIS — Z1389 Encounter for screening for other disorder: Secondary | ICD-10-CM | POA: Diagnosis not present

## 2020-05-23 DIAGNOSIS — G609 Hereditary and idiopathic neuropathy, unspecified: Secondary | ICD-10-CM | POA: Diagnosis not present

## 2020-05-23 DIAGNOSIS — E1142 Type 2 diabetes mellitus with diabetic polyneuropathy: Secondary | ICD-10-CM | POA: Diagnosis not present

## 2020-05-23 DIAGNOSIS — I480 Paroxysmal atrial fibrillation: Secondary | ICD-10-CM | POA: Diagnosis not present

## 2020-05-23 DIAGNOSIS — Z0000 Encounter for general adult medical examination without abnormal findings: Secondary | ICD-10-CM | POA: Diagnosis not present

## 2020-05-29 DIAGNOSIS — N432 Other hydrocele: Secondary | ICD-10-CM | POA: Diagnosis not present

## 2020-06-04 DIAGNOSIS — L814 Other melanin hyperpigmentation: Secondary | ICD-10-CM | POA: Diagnosis not present

## 2020-06-04 DIAGNOSIS — D1801 Hemangioma of skin and subcutaneous tissue: Secondary | ICD-10-CM | POA: Diagnosis not present

## 2020-06-04 DIAGNOSIS — L570 Actinic keratosis: Secondary | ICD-10-CM | POA: Diagnosis not present

## 2020-06-04 DIAGNOSIS — D485 Neoplasm of uncertain behavior of skin: Secondary | ICD-10-CM | POA: Diagnosis not present

## 2020-06-04 DIAGNOSIS — L720 Epidermal cyst: Secondary | ICD-10-CM | POA: Diagnosis not present

## 2020-06-04 DIAGNOSIS — L821 Other seborrheic keratosis: Secondary | ICD-10-CM | POA: Diagnosis not present

## 2020-06-04 DIAGNOSIS — D225 Melanocytic nevi of trunk: Secondary | ICD-10-CM | POA: Diagnosis not present

## 2020-06-04 DIAGNOSIS — D2262 Melanocytic nevi of left upper limb, including shoulder: Secondary | ICD-10-CM | POA: Diagnosis not present

## 2020-06-16 DIAGNOSIS — Z905 Acquired absence of kidney: Secondary | ICD-10-CM | POA: Diagnosis not present

## 2020-06-16 DIAGNOSIS — K760 Fatty (change of) liver, not elsewhere classified: Secondary | ICD-10-CM | POA: Diagnosis not present

## 2020-06-16 DIAGNOSIS — R809 Proteinuria, unspecified: Secondary | ICD-10-CM | POA: Diagnosis not present

## 2020-06-16 DIAGNOSIS — C641 Malignant neoplasm of right kidney, except renal pelvis: Secondary | ICD-10-CM | POA: Diagnosis not present

## 2020-06-16 DIAGNOSIS — C649 Malignant neoplasm of unspecified kidney, except renal pelvis: Secondary | ICD-10-CM | POA: Diagnosis not present

## 2020-07-23 DIAGNOSIS — I10 Essential (primary) hypertension: Secondary | ICD-10-CM | POA: Diagnosis not present

## 2020-07-23 DIAGNOSIS — I480 Paroxysmal atrial fibrillation: Secondary | ICD-10-CM | POA: Diagnosis not present

## 2020-07-23 DIAGNOSIS — M159 Polyosteoarthritis, unspecified: Secondary | ICD-10-CM | POA: Diagnosis not present

## 2020-07-23 DIAGNOSIS — N400 Enlarged prostate without lower urinary tract symptoms: Secondary | ICD-10-CM | POA: Diagnosis not present

## 2020-07-23 DIAGNOSIS — N1831 Chronic kidney disease, stage 3a: Secondary | ICD-10-CM | POA: Diagnosis not present

## 2020-07-23 DIAGNOSIS — E1142 Type 2 diabetes mellitus with diabetic polyneuropathy: Secondary | ICD-10-CM | POA: Diagnosis not present

## 2020-07-23 DIAGNOSIS — I2510 Atherosclerotic heart disease of native coronary artery without angina pectoris: Secondary | ICD-10-CM | POA: Diagnosis not present

## 2020-07-23 DIAGNOSIS — E7800 Pure hypercholesterolemia, unspecified: Secondary | ICD-10-CM | POA: Diagnosis not present

## 2020-10-23 DIAGNOSIS — I10 Essential (primary) hypertension: Secondary | ICD-10-CM | POA: Diagnosis not present

## 2020-10-23 DIAGNOSIS — E539 Vitamin B deficiency, unspecified: Secondary | ICD-10-CM | POA: Diagnosis not present

## 2020-10-23 DIAGNOSIS — E559 Vitamin D deficiency, unspecified: Secondary | ICD-10-CM | POA: Diagnosis not present

## 2020-10-23 DIAGNOSIS — E1142 Type 2 diabetes mellitus with diabetic polyneuropathy: Secondary | ICD-10-CM | POA: Diagnosis not present

## 2020-10-23 DIAGNOSIS — I2510 Atherosclerotic heart disease of native coronary artery without angina pectoris: Secondary | ICD-10-CM | POA: Diagnosis not present

## 2020-10-23 DIAGNOSIS — N1831 Chronic kidney disease, stage 3a: Secondary | ICD-10-CM | POA: Diagnosis not present

## 2020-10-23 DIAGNOSIS — N400 Enlarged prostate without lower urinary tract symptoms: Secondary | ICD-10-CM | POA: Diagnosis not present

## 2020-10-23 DIAGNOSIS — M159 Polyosteoarthritis, unspecified: Secondary | ICD-10-CM | POA: Diagnosis not present

## 2020-10-23 DIAGNOSIS — I480 Paroxysmal atrial fibrillation: Secondary | ICD-10-CM | POA: Diagnosis not present

## 2020-10-23 DIAGNOSIS — N183 Chronic kidney disease, stage 3 unspecified: Secondary | ICD-10-CM | POA: Diagnosis not present

## 2020-10-23 DIAGNOSIS — E538 Deficiency of other specified B group vitamins: Secondary | ICD-10-CM | POA: Diagnosis not present

## 2020-10-23 DIAGNOSIS — E7800 Pure hypercholesterolemia, unspecified: Secondary | ICD-10-CM | POA: Diagnosis not present

## 2020-11-04 NOTE — Progress Notes (Signed)
Cardiology Office Note:    Date:  11/05/2020   ID:  Donald Gray, DOB [birth year], MRN [id number]  PCP:  Lavone Orn, MD  Cardiologist:  Sinclair Grooms, MD   Referring MD: Lavone Orn, MD   Chief Complaint  Patient presents with   Coronary Artery Disease   Congestive Heart Failure     History of Present Illness:    Donald Gray is a 80 y.o. male with a hx of CAD with remote stent in RCA1995, obesity, diet controlled DM, HLD, HTN, renal carcinoma treated with targeted chemotherapy, and PAF wiith CHADS-VASC  score of 4.    He is doing well.  He denies angina.  He does have exertional fatigue and dyspnea.  He does admit to being deconditioned during the pandemic.  He has not had chest pain.  He has not been able to exercise on a regular basis.  Past Medical History:  Diagnosis Date   Anemia    Anticoagulated- to go to Xarelto for a fib 10/07/2013   Arthritis    "fingers, toes" (10/05/2013)   Coronary artery disease    a. s/p MI with stent in 2000   Diverticulosis    Dysrhythmia    GERD (gastroesophageal reflux disease)    History of blood transfusion    "w/nephrectomy; w/shoulder replacement; w/knee replacement"   Hyperlipidemia    Hypertension    Left knee DJD    Myocardial infarction ([place]) 1997   Neuropathy    feet bilat    NSTEMI (non-ST elevated myocardial infarction), type 2 due to demand ischemia 10/07/2013   PAF (paroxysmal atrial fibrillation) ([place])    Pneumonia ~ 2007   again 3 months ago    Primary localized osteoarthritis of right knee    Renal cell carcinoma ([place])    a. s/p nephrectomy   Shortness of breath    a. related to chemo drugs   Tendonitis    currently on prednisone and wearing brace (left wrist)   Type II diabetes mellitus ([place])    "treating w/diet and weight loss"    Past Surgical History:  Procedure Laterality Date   CARDIAC CATHETERIZATION  05/2009   CARDIOVERSION N/A 10/08/2013   Procedure: CARDIOVERSION;  Surgeon:  Dorothy Spark, MD;  Location: [place];  Service: Cardiovascular;  Laterality: N/A;   CHEILECTOMY Right 04/13/2013   Procedure: Right Great Toe Cheilectomy, Debride Tendon and Abscess, Apply Stimulan Beads;  Surgeon: Newt Minion, MD;  Location: [place];  Service: Orthopedics;  Laterality: Right;  Right Great Toe Cheilectomy, Debride Tendon and Abscess, Apply Stimulan Beads   colonscopy      CORONARY ANGIOPLASTY WITH STENT PLACEMENT  1997   at [place] to [place]  09/22/2011   Procedure: HERNIA REPAIR INGUINAL ADULT BILATERAL;  Surgeon: Imogene Burn. Georgette Dover, MD;  [facility];  Service: General;  Laterality: Bilateral;   INGUINAL HERNIA REPAIR Bilateral 09/22/11   JOINT REPLACEMENT     KNEE ARTHROSCOPY Right ~ 2011   KNEE ARTHROSCOPY Left 11/13/2012   Procedure: ARTHROSCOPY KNEE;  Surgeon: Lorn Junes, MD;  Location: [place];  Service: Orthopedics;  Laterality: Left;   LUNG BIOPSY  2011   MASS EXCISION Right 04/12/2014   Procedure: MASS EXCISION DORSUM OF RIGHT FOOT  ;  Surgeon: Tobi Bastos, MD;  [facility];  Service: Orthopedics;  Laterality: Right;   NEPHRECTOMY Right 08/14/2009   ORCHIECTOMY  09/22/2011   Procedure: ORCHIECTOMY;  Surgeon: Franchot Gallo, MD;  [facility];  Service: Urology;  Laterality: Left;  Left Testicular Exploration, Inguinal Approach,  Testicular    PILONIDAL CYST DRAINAGE     TEE WITHOUT CARDIOVERSION N/A 10/08/2013   Procedure: TRANSESOPHAGEAL ECHOCARDIOGRAM (TEE);  Surgeon: Dorothy Spark, MD;  Location: [facility];  Service: Cardiovascular;  Laterality: N/A;   [place] Left 11/13/2012   Procedure: TOTAL KNEE ARTHROPLASTY;  Surgeon: Lorn Junes, MD;  Location: [place];  Service: Orthopedics;  Laterality: Left;   TOTAL KNEE ARTHROPLASTY Right 07/22/2014   Procedure: RIGHT TOTAL KNEE ARTHROPLASTY;  Surgeon: Elsie Saas, MD;  Location: [place];  Service: Orthopedics;   Laterality: Right;   TOTAL SHOULDER REPLACEMENT Right 1997    Current Medications: Current Meds  Medication Sig   Alpha-Lipoic Acid 300 MG CAPS Take 300 mg by mouth 2 (two) times daily.   atorvastatin (LIPITOR) 10 MG tablet Take 10 mg by mouth daily with breakfast.    cetirizine (ZYRTEC) 10 MG tablet Take 10 mg by mouth daily. As needed  Alternates monthly between this and Allegra Pt currently taking Allegra   cholecalciferol (VITAMIN D) 1000 UNITS tablet Take 1,000 Units by mouth daily.   clindamycin (CLEOCIN) 150 MG capsule Take 4 capsules by mouth as needed. (Take one (1) hour prior to dental procedures)   fluticasone (FLONASE) 50 MCG/ACT nasal spray Place 1 spray into both nostrils daily.   magnesium gluconate (MAGONATE) 500 MG tablet Take 500 mg by mouth 2 (two) times daily.    metoprolol tartrate (LOPRESSOR) 25 MG tablet Take 1 tablet (25 mg total) by mouth 2 (two) times daily.   Multiple Vitamin (MULTIVITAMIN WITH MINERALS) TABS Take 1 tablet by mouth daily.   nitroGLYCERIN (NITROSTAT) 0.4 MG SL tablet Place 0.4 mg under the tongue every 5 (five) minutes as needed for chest pain.   Polyethyl Glycol-Propyl Glycol 0.4-0.3 % SOLN Place 1 drop into both eyes daily as needed (dry eyes).   rivaroxaban (XARELTO) 20 MG TABS tablet Take 1 tablet (20 mg total) by mouth daily.   telmisartan (MICARDIS) 80 MG tablet Take 80 mg by mouth every evening.   vitamin C (ASCORBIC ACID) 500 MG tablet Take 500 mg by mouth daily.      Allergies:   Penicillins, Ambien [zolpidem tartrate], Codeine, and Lisinopril   Social History   Socioeconomic History   Marital status: Married    Spouse name: Not on file   Number of children: Not on file   Years of education: Not on file   Highest education level: Not on file  Occupational History   Not on file  Tobacco Use   Smoking status: Former    Packs/day: 1.00    Years: 2.00    Pack years: 2.00    Types: Cigarettes    Quit date: 05/03/1961    Years  since quitting: 59.5   Smokeless tobacco: Never  Vaping Use   Vaping Use: Never used  Substance and Sexual Activity   Alcohol use: Yes    Alcohol/week: 1.0 standard drink    Types: 1 Glasses of wine per week    Comment: occas glass of wine every 2 wks   Drug use: No   Sexual activity: Not Currently  Other Topics Concern   Not on file  Social History Narrative   Retired English as a second language teacher.   Lives with wife.   Social Determinants of Health   Financial Resource Strain: Not on  file  Food Insecurity: Not on file  Transportation Needs: Not on file  Physical Activity: Not on file  Stress: Not on file  Social Connections: Not on file     Family History: The patient's family history includes Dementia in his mother; Healthy in his daughter; Heart disease in his father; Hypertension in his father; Skin cancer in his son; Thyroid disease in his brother and mother.  ROS:   Please see the history of present illness.    He has numbness and tingling in his legs.  He has easy bruising and bleeding.  He is on Xarelto 20 mg/day.  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  He has right hip discomfort.  Wants to know if he needed a hip operation could he get it.  All other systems reviewed and are negative.  EKGs/Labs/Other Studies Reviewed:    The following studies were reviewed today: None recently.  EKG:  EKG normal EKG.  PR interval is normal.  Recent Labs: No results found for requested labs within last 8760 hours.  Recent Lipid Panel    Component Value Date/Time   CHOL 137 07/20/2017 1015   TRIG 190 (H) 07/20/2017 1015   HDL 35 (L) 07/20/2017 1015   CHOLHDL 3.9 07/20/2017 1015   CHOLHDL 3.1 10/06/2013 0442   VLDL 15 10/06/2013 0442   LDLCALC 64 07/20/2017 1015    Physical Exam:    VS:  BP 130/64   Pulse 61   Ht 5\' 11"  (1.803 m)   Wt 212 lb 9.6 oz (96.4 kg)   SpO2 98%   BMI 29.65 kg/m     Wt Readings from Last 3 Encounters:  11/05/20 212 lb 9.6 oz  (96.4 kg)  08/27/19 212 lb 1.9 oz (96.2 kg)  10/24/18 214 lb 12.8 oz (97.4 kg)     GEN: Moderate obesity. No acute distress HEENT: Normal NECK: No JVD. LYMPHATICS: No lymphadenopathy CARDIAC: No murmur. RRR no gallop, or edema. VASCULAR:  Normal Pulses. No bruits. RESPIRATORY:  Clear to auscultation without rales, wheezing or rhonchi  ABDOMEN: Soft, non-tender, non-distended, No pulsatile mass, MUSCULOSKELETAL: No deformity  SKIN: Warm and dry NEUROLOGIC:  Alert and oriented x 3 PSYCHIATRIC:  Normal affect   ASSESSMENT:    1. Coronary artery disease involving native coronary artery of native heart with angina pectoris (HCC)   2. Paroxysmal atrial fibrillation ([place])   3. Chronic diastolic heart failure ([place])   4. Essential hypertension   5. Other hyperlipidemia   6. Anticoagulated- to go to Xarelto for a fib    PLAN:    In order of problems listed above:  Secondary prevention measures discussed.  I encouraged gradually increasing exercise to achieve about 30 minutes of moderate activity 5 out of 7 days of the week.  If he hits a wall with fatigue and dyspnea not improving, he needs to call back and we can do further evaluation.  Right now I feel he may be physically deconditioned related to inactivity during the pandemic. No atrial fibrillation noted.  Continue chronic anticoagulation therapy. No evidence of volume overload. Blood pressure is under good control. Continue Lipitor 10 mg/day. Continue Xarelto.  Monitor for bleeding.  Overall education and awareness concerning primary/secondary risk prevention was discussed in detail: LDL less than 70, hemoglobin A1c less than 7, blood pressure target less than 130/80 mmHg, >150 minutes of moderate aerobic activity per week, avoidance of smoking, weight control (via diet and exercise), and continued surveillance/management of/for obstructive sleep  apnea.    Medication Adjustments/Labs and Tests Ordered: Current medicines are  reviewed at length with the patient today.  Concerns regarding medicines are outlined above.  Orders Placed This Encounter  Procedures   EKG 12-Lead   No orders of the defined types were placed in this encounter.   Patient Instructions  Medication Instructions:  Your physician recommends that you continue on your current medications as directed. Please refer to the Current Medication list given to you today.  *If you need a refill on your cardiac medications before your next appointment, please call your pharmacy*  Lab Work: If you have labs (blood work) drawn today and your tests are completely normal, you will receive your results only by: [place] (if you have MyChart) OR A paper copy in the mail If you have any lab test that is abnormal or we need to change your treatment, we will call you to review the results.   Testing/Procedures: None ordered today.   Follow-Up: At [facility], you and your health needs are our priority.  As part of our continuing mission to provide you with exceptional heart care, we have created designated Provider Care Teams.  These Care Teams include your primary Cardiologist (physician) and Advanced Practice Providers (APPs -  Physician Assistants and Nurse Practitioners) who all work together to provide you with the care you need, when you need it.  We recommend signing up for the patient portal called "MyChart".  Sign up information is provided on this After Visit Summary.  MyChart is used to connect with patients for Virtual Visits (Telemedicine).  Patients are able to view lab/test results, encounter notes, upcoming appointments, etc.  Non-urgent messages can be sent to your provider as well.   To learn more about what you can do with MyChart, go to [email or web address].    Your next appointment:   9 to 12 month(s)  The format for your next appointment:   In Person  Provider:   You may see Sinclair Grooms, MD or one of the  following Advanced Practice Providers on your designated Care Team:   Kathyrn Drown, NP   Signed, Sinclair Grooms, MD  11/05/2020 5:25 PM    [place]

## 2020-11-05 ENCOUNTER — Encounter: Payer: Self-pay | Admitting: Interventional Cardiology

## 2020-11-05 ENCOUNTER — Ambulatory Visit: Payer: Medicare HMO | Admitting: Interventional Cardiology

## 2020-11-05 ENCOUNTER — Other Ambulatory Visit: Payer: Self-pay

## 2020-11-05 VITALS — BP 130/64 | HR 61 | Ht 71.0 in | Wt 212.6 lb

## 2020-11-05 DIAGNOSIS — Z7901 Long term (current) use of anticoagulants: Secondary | ICD-10-CM | POA: Diagnosis not present

## 2020-11-05 DIAGNOSIS — E7849 Other hyperlipidemia: Secondary | ICD-10-CM

## 2020-11-05 DIAGNOSIS — I5032 Chronic diastolic (congestive) heart failure: Secondary | ICD-10-CM | POA: Diagnosis not present

## 2020-11-05 DIAGNOSIS — I25119 Atherosclerotic heart disease of native coronary artery with unspecified angina pectoris: Secondary | ICD-10-CM

## 2020-11-05 DIAGNOSIS — I480 Paroxysmal atrial fibrillation: Secondary | ICD-10-CM

## 2020-11-05 DIAGNOSIS — I10 Essential (primary) hypertension: Secondary | ICD-10-CM

## 2020-11-05 NOTE — Patient Instructions (Addendum)
Medication Instructions:  Your physician recommends that you continue on your current medications as directed. Please refer to the Current Medication list given to you today.  *If you need a refill on your cardiac medications before your next appointment, please call your pharmacy*  Lab Work: If you have labs (blood work) drawn today and your tests are completely normal, you will receive your results only by: [place] (if you have MyChart) OR A paper copy in the mail If you have any lab test that is abnormal or we need to change your treatment, we will call you to review the results.   Testing/Procedures: None ordered today.   Follow-Up: At [facility], you and your health needs are our priority.  As part of our continuing mission to provide you with exceptional heart care, we have created designated Provider Care Teams.  These Care Teams include your primary Cardiologist (physician) and Advanced Practice Providers (APPs -  Physician Assistants and Nurse Practitioners) who all work together to provide you with the care you need, when you need it.  We recommend signing up for the patient portal called "MyChart".  Sign up information is provided on this After Visit Summary.  MyChart is used to connect with patients for Virtual Visits (Telemedicine).  Patients are able to view lab/test results, encounter notes, upcoming appointments, etc.  Non-urgent messages can be sent to your provider as well.   To learn more about what you can do with MyChart, go to [email or web address].    Your next appointment:   9 to 12 month(s)  The format for your next appointment:   In Person  Provider:   You may see Sinclair Grooms, MD or one of the following Advanced Practice Providers on your designated Care Team:   Kathyrn Drown, NP

## 2020-12-18 DIAGNOSIS — N433 Hydrocele, unspecified: Secondary | ICD-10-CM | POA: Diagnosis not present

## 2020-12-18 DIAGNOSIS — C649 Malignant neoplasm of unspecified kidney, except renal pelvis: Secondary | ICD-10-CM | POA: Diagnosis not present

## 2020-12-18 DIAGNOSIS — N529 Male erectile dysfunction, unspecified: Secondary | ICD-10-CM | POA: Diagnosis not present

## 2021-02-10 DIAGNOSIS — H5203 Hypermetropia, bilateral: Secondary | ICD-10-CM | POA: Diagnosis not present

## 2021-02-10 DIAGNOSIS — H2513 Age-related nuclear cataract, bilateral: Secondary | ICD-10-CM | POA: Diagnosis not present

## 2021-02-10 DIAGNOSIS — E119 Type 2 diabetes mellitus without complications: Secondary | ICD-10-CM | POA: Diagnosis not present

## 2021-02-10 DIAGNOSIS — H524 Presbyopia: Secondary | ICD-10-CM | POA: Diagnosis not present

## 2021-03-07 DIAGNOSIS — S40011A Contusion of right shoulder, initial encounter: Secondary | ICD-10-CM | POA: Diagnosis not present

## 2021-03-09 DIAGNOSIS — M5431 Sciatica, right side: Secondary | ICD-10-CM | POA: Diagnosis not present

## 2021-03-18 DIAGNOSIS — G9009 Other idiopathic peripheral autonomic neuropathy: Secondary | ICD-10-CM | POA: Diagnosis not present

## 2021-03-18 DIAGNOSIS — M6281 Muscle weakness (generalized): Secondary | ICD-10-CM | POA: Diagnosis not present

## 2021-03-18 DIAGNOSIS — Z9181 History of falling: Secondary | ICD-10-CM | POA: Diagnosis not present

## 2021-03-24 DIAGNOSIS — M6281 Muscle weakness (generalized): Secondary | ICD-10-CM | POA: Diagnosis not present

## 2021-03-24 DIAGNOSIS — G9009 Other idiopathic peripheral autonomic neuropathy: Secondary | ICD-10-CM | POA: Diagnosis not present

## 2021-03-24 DIAGNOSIS — Z9181 History of falling: Secondary | ICD-10-CM | POA: Diagnosis not present

## 2021-04-01 DIAGNOSIS — Z9181 History of falling: Secondary | ICD-10-CM | POA: Diagnosis not present

## 2021-04-01 DIAGNOSIS — G9009 Other idiopathic peripheral autonomic neuropathy: Secondary | ICD-10-CM | POA: Diagnosis not present

## 2021-04-01 DIAGNOSIS — M6281 Muscle weakness (generalized): Secondary | ICD-10-CM | POA: Diagnosis not present

## 2021-04-03 DIAGNOSIS — Z9181 History of falling: Secondary | ICD-10-CM | POA: Diagnosis not present

## 2021-04-03 DIAGNOSIS — M6281 Muscle weakness (generalized): Secondary | ICD-10-CM | POA: Diagnosis not present

## 2021-04-03 DIAGNOSIS — G9009 Other idiopathic peripheral autonomic neuropathy: Secondary | ICD-10-CM | POA: Diagnosis not present

## 2021-04-06 DIAGNOSIS — Z9181 History of falling: Secondary | ICD-10-CM | POA: Diagnosis not present

## 2021-04-06 DIAGNOSIS — G9009 Other idiopathic peripheral autonomic neuropathy: Secondary | ICD-10-CM | POA: Diagnosis not present

## 2021-04-06 DIAGNOSIS — M6281 Muscle weakness (generalized): Secondary | ICD-10-CM | POA: Diagnosis not present

## 2021-04-10 DIAGNOSIS — G9009 Other idiopathic peripheral autonomic neuropathy: Secondary | ICD-10-CM | POA: Diagnosis not present

## 2021-04-10 DIAGNOSIS — Z9181 History of falling: Secondary | ICD-10-CM | POA: Diagnosis not present

## 2021-04-10 DIAGNOSIS — M6281 Muscle weakness (generalized): Secondary | ICD-10-CM | POA: Diagnosis not present

## 2021-04-15 DIAGNOSIS — M25551 Pain in right hip: Secondary | ICD-10-CM | POA: Diagnosis not present

## 2021-04-15 DIAGNOSIS — M6281 Muscle weakness (generalized): Secondary | ICD-10-CM | POA: Diagnosis not present

## 2021-04-15 DIAGNOSIS — G9009 Other idiopathic peripheral autonomic neuropathy: Secondary | ICD-10-CM | POA: Diagnosis not present

## 2021-04-15 DIAGNOSIS — Z9181 History of falling: Secondary | ICD-10-CM | POA: Diagnosis not present

## 2021-04-17 DIAGNOSIS — M6281 Muscle weakness (generalized): Secondary | ICD-10-CM | POA: Diagnosis not present

## 2021-04-17 DIAGNOSIS — G9009 Other idiopathic peripheral autonomic neuropathy: Secondary | ICD-10-CM | POA: Diagnosis not present

## 2021-04-17 DIAGNOSIS — Z9181 History of falling: Secondary | ICD-10-CM | POA: Diagnosis not present

## 2021-04-22 DIAGNOSIS — M6281 Muscle weakness (generalized): Secondary | ICD-10-CM | POA: Diagnosis not present

## 2021-04-22 DIAGNOSIS — Z9181 History of falling: Secondary | ICD-10-CM | POA: Diagnosis not present

## 2021-04-22 DIAGNOSIS — G9009 Other idiopathic peripheral autonomic neuropathy: Secondary | ICD-10-CM | POA: Diagnosis not present

## 2021-04-24 DIAGNOSIS — M6281 Muscle weakness (generalized): Secondary | ICD-10-CM | POA: Diagnosis not present

## 2021-04-24 DIAGNOSIS — Z9181 History of falling: Secondary | ICD-10-CM | POA: Diagnosis not present

## 2021-04-24 DIAGNOSIS — G9009 Other idiopathic peripheral autonomic neuropathy: Secondary | ICD-10-CM | POA: Diagnosis not present

## 2021-04-29 DIAGNOSIS — Z9181 History of falling: Secondary | ICD-10-CM | POA: Diagnosis not present

## 2021-04-29 DIAGNOSIS — M6281 Muscle weakness (generalized): Secondary | ICD-10-CM | POA: Diagnosis not present

## 2021-04-29 DIAGNOSIS — G9009 Other idiopathic peripheral autonomic neuropathy: Secondary | ICD-10-CM | POA: Diagnosis not present

## 2021-05-01 DIAGNOSIS — M6281 Muscle weakness (generalized): Secondary | ICD-10-CM | POA: Diagnosis not present

## 2021-05-01 DIAGNOSIS — G9009 Other idiopathic peripheral autonomic neuropathy: Secondary | ICD-10-CM | POA: Diagnosis not present

## 2021-05-01 DIAGNOSIS — Z9181 History of falling: Secondary | ICD-10-CM | POA: Diagnosis not present

## 2021-05-06 DIAGNOSIS — G9009 Other idiopathic peripheral autonomic neuropathy: Secondary | ICD-10-CM | POA: Diagnosis not present

## 2021-05-06 DIAGNOSIS — M6281 Muscle weakness (generalized): Secondary | ICD-10-CM | POA: Diagnosis not present

## 2021-05-06 DIAGNOSIS — Z9181 History of falling: Secondary | ICD-10-CM | POA: Diagnosis not present

## 2021-05-08 DIAGNOSIS — Z9181 History of falling: Secondary | ICD-10-CM | POA: Diagnosis not present

## 2021-05-08 DIAGNOSIS — M6281 Muscle weakness (generalized): Secondary | ICD-10-CM | POA: Diagnosis not present

## 2021-05-08 DIAGNOSIS — G9009 Other idiopathic peripheral autonomic neuropathy: Secondary | ICD-10-CM | POA: Diagnosis not present

## 2021-05-13 DIAGNOSIS — G9009 Other idiopathic peripheral autonomic neuropathy: Secondary | ICD-10-CM | POA: Diagnosis not present

## 2021-05-13 DIAGNOSIS — M6281 Muscle weakness (generalized): Secondary | ICD-10-CM | POA: Diagnosis not present

## 2021-05-13 DIAGNOSIS — Z9181 History of falling: Secondary | ICD-10-CM | POA: Diagnosis not present

## 2021-05-15 DIAGNOSIS — G9009 Other idiopathic peripheral autonomic neuropathy: Secondary | ICD-10-CM | POA: Diagnosis not present

## 2021-05-15 DIAGNOSIS — M6281 Muscle weakness (generalized): Secondary | ICD-10-CM | POA: Diagnosis not present

## 2021-05-15 DIAGNOSIS — Z9181 History of falling: Secondary | ICD-10-CM | POA: Diagnosis not present

## 2021-05-20 DIAGNOSIS — M25551 Pain in right hip: Secondary | ICD-10-CM | POA: Diagnosis not present

## 2021-05-28 DIAGNOSIS — I480 Paroxysmal atrial fibrillation: Secondary | ICD-10-CM | POA: Diagnosis not present

## 2021-05-28 DIAGNOSIS — Z1389 Encounter for screening for other disorder: Secondary | ICD-10-CM | POA: Diagnosis not present

## 2021-05-28 DIAGNOSIS — Z1331 Encounter for screening for depression: Secondary | ICD-10-CM | POA: Diagnosis not present

## 2021-05-28 DIAGNOSIS — G609 Hereditary and idiopathic neuropathy, unspecified: Secondary | ICD-10-CM | POA: Diagnosis not present

## 2021-05-28 DIAGNOSIS — I10 Essential (primary) hypertension: Secondary | ICD-10-CM | POA: Diagnosis not present

## 2021-05-28 DIAGNOSIS — E7800 Pure hypercholesterolemia, unspecified: Secondary | ICD-10-CM | POA: Diagnosis not present

## 2021-05-28 DIAGNOSIS — Z0000 Encounter for general adult medical examination without abnormal findings: Secondary | ICD-10-CM | POA: Diagnosis not present

## 2021-05-28 DIAGNOSIS — E1142 Type 2 diabetes mellitus with diabetic polyneuropathy: Secondary | ICD-10-CM | POA: Diagnosis not present

## 2021-05-28 DIAGNOSIS — I2510 Atherosclerotic heart disease of native coronary artery without angina pectoris: Secondary | ICD-10-CM | POA: Diagnosis not present

## 2021-05-28 DIAGNOSIS — Z85528 Personal history of other malignant neoplasm of kidney: Secondary | ICD-10-CM | POA: Diagnosis not present

## 2021-05-28 DIAGNOSIS — N1831 Chronic kidney disease, stage 3a: Secondary | ICD-10-CM | POA: Diagnosis not present

## 2021-05-28 DIAGNOSIS — Z23 Encounter for immunization: Secondary | ICD-10-CM | POA: Diagnosis not present

## 2021-06-10 DIAGNOSIS — L814 Other melanin hyperpigmentation: Secondary | ICD-10-CM | POA: Diagnosis not present

## 2021-06-10 DIAGNOSIS — L820 Inflamed seborrheic keratosis: Secondary | ICD-10-CM | POA: Diagnosis not present

## 2021-06-10 DIAGNOSIS — D692 Other nonthrombocytopenic purpura: Secondary | ICD-10-CM | POA: Diagnosis not present

## 2021-06-10 DIAGNOSIS — L821 Other seborrheic keratosis: Secondary | ICD-10-CM | POA: Diagnosis not present

## 2021-06-10 DIAGNOSIS — D1801 Hemangioma of skin and subcutaneous tissue: Secondary | ICD-10-CM | POA: Diagnosis not present

## 2021-06-16 DIAGNOSIS — Z96611 Presence of right artificial shoulder joint: Secondary | ICD-10-CM | POA: Diagnosis not present

## 2021-06-16 DIAGNOSIS — C641 Malignant neoplasm of right kidney, except renal pelvis: Secondary | ICD-10-CM | POA: Diagnosis not present

## 2021-06-16 DIAGNOSIS — Z85118 Personal history of other malignant neoplasm of bronchus and lung: Secondary | ICD-10-CM | POA: Diagnosis not present

## 2021-06-16 DIAGNOSIS — I10 Essential (primary) hypertension: Secondary | ICD-10-CM | POA: Diagnosis not present

## 2021-06-16 DIAGNOSIS — I2510 Atherosclerotic heart disease of native coronary artery without angina pectoris: Secondary | ICD-10-CM | POA: Diagnosis not present

## 2021-06-16 DIAGNOSIS — J8410 Pulmonary fibrosis, unspecified: Secondary | ICD-10-CM | POA: Diagnosis not present

## 2021-06-16 DIAGNOSIS — Z905 Acquired absence of kidney: Secondary | ICD-10-CM | POA: Diagnosis not present

## 2021-07-01 DIAGNOSIS — E785 Hyperlipidemia, unspecified: Secondary | ICD-10-CM | POA: Diagnosis not present

## 2021-07-01 DIAGNOSIS — I4891 Unspecified atrial fibrillation: Secondary | ICD-10-CM | POA: Diagnosis not present

## 2021-07-01 DIAGNOSIS — I10 Essential (primary) hypertension: Secondary | ICD-10-CM | POA: Diagnosis not present

## 2021-07-01 DIAGNOSIS — Z85528 Personal history of other malignant neoplasm of kidney: Secondary | ICD-10-CM | POA: Diagnosis not present

## 2021-07-01 DIAGNOSIS — Z833 Family history of diabetes mellitus: Secondary | ICD-10-CM | POA: Diagnosis not present

## 2021-07-01 DIAGNOSIS — Z7901 Long term (current) use of anticoagulants: Secondary | ICD-10-CM | POA: Diagnosis not present

## 2021-07-01 DIAGNOSIS — Z008 Encounter for other general examination: Secondary | ICD-10-CM | POA: Diagnosis not present

## 2021-07-01 DIAGNOSIS — I252 Old myocardial infarction: Secondary | ICD-10-CM | POA: Diagnosis not present

## 2021-07-01 DIAGNOSIS — D6869 Other thrombophilia: Secondary | ICD-10-CM | POA: Diagnosis not present

## 2021-07-01 DIAGNOSIS — N529 Male erectile dysfunction, unspecified: Secondary | ICD-10-CM | POA: Diagnosis not present

## 2021-07-01 DIAGNOSIS — Z8249 Family history of ischemic heart disease and other diseases of the circulatory system: Secondary | ICD-10-CM | POA: Diagnosis not present

## 2021-07-01 DIAGNOSIS — I25119 Atherosclerotic heart disease of native coronary artery with unspecified angina pectoris: Secondary | ICD-10-CM | POA: Diagnosis not present

## 2021-07-01 DIAGNOSIS — Z720 Tobacco use: Secondary | ICD-10-CM | POA: Diagnosis not present

## 2021-09-07 DIAGNOSIS — M25562 Pain in left knee: Secondary | ICD-10-CM | POA: Diagnosis not present

## 2021-09-15 DIAGNOSIS — M25562 Pain in left knee: Secondary | ICD-10-CM | POA: Diagnosis not present

## 2021-10-02 DIAGNOSIS — T8484XA Pain due to internal orthopedic prosthetic devices, implants and grafts, initial encounter: Secondary | ICD-10-CM | POA: Diagnosis not present

## 2021-11-05 DIAGNOSIS — R252 Cramp and spasm: Secondary | ICD-10-CM | POA: Diagnosis not present

## 2021-11-05 DIAGNOSIS — E1142 Type 2 diabetes mellitus with diabetic polyneuropathy: Secondary | ICD-10-CM | POA: Diagnosis not present

## 2021-11-16 DIAGNOSIS — R1031 Right lower quadrant pain: Secondary | ICD-10-CM | POA: Diagnosis not present

## 2021-11-16 DIAGNOSIS — R809 Proteinuria, unspecified: Secondary | ICD-10-CM | POA: Diagnosis not present

## 2021-11-16 DIAGNOSIS — G609 Hereditary and idiopathic neuropathy, unspecified: Secondary | ICD-10-CM | POA: Diagnosis not present

## 2021-11-16 DIAGNOSIS — E1142 Type 2 diabetes mellitus with diabetic polyneuropathy: Secondary | ICD-10-CM | POA: Diagnosis not present

## 2021-11-16 DIAGNOSIS — I10 Essential (primary) hypertension: Secondary | ICD-10-CM | POA: Diagnosis not present

## 2021-11-24 DIAGNOSIS — R809 Proteinuria, unspecified: Secondary | ICD-10-CM | POA: Diagnosis not present

## 2021-11-24 NOTE — Progress Notes (Unsigned)
Office Visit    Patient Name: Donald Gray Date of Encounter: 11/25/2021  PCP:  Lavone Orn, [facility]  Cardiologist:  Sinclair Grooms, MD  Advanced Practice Provider:  No care team member to display Electrophysiologist:  None    Chief Complaint    Donald Gray is a 81 y.o. male medical history of CAD with remote stent in RCA (1995), obesity, diet-controlled DM, HLD, HTN, renal carcinoma treated with targeted chemotherapy, and PAF with CHA2DS2-VASc score of 4 on Xarelto presents today for annual follow-up appointment.  He was last seen in the office last year 10/2020 with Dr. Tamala Julian.  He was doing well at that time.  He did have exertional fatigue and dyspnea.  He admitted to being deconditioned during the pandemic.  He had not had any chest pain.  He had not been able to exercise on a regular basis.  Today, he states he is having some shortness of breath.  He missed his 6-monthfollow-up appointment.  He has not needed any nitroglycerin in a long time.  He states that his rhythm has been regular and he wants to know when he can stop the Xarelto.  He says his activity level is limited to leg and back pain.  His shortness of breath could be due to some exercise intolerance.  His PCP looks after his cholesterol levels and the most recent numbers that I have are total cholesterol 156, HDL 54, LDL 83, and triglycerides 104.  LDL goal would be less than 70 due to history of CAD.  He would like to try going off his statin therapy because she believes that it is making his leg pain worse.  We will arrange for him to see one of our Pharm.D.'s to discuss injectable options and to see if insurance would cover.   Reports no shortness of breath nor dyspnea on exertion. Reports no chest pain, pressure, or tightness. No edema, orthopnea, PND. Reports no palpitations.    Past Medical History    Past Medical History:  Diagnosis Date   Anemia     Anticoagulated- to go to Xarelto for a fib 10/07/2013   Arthritis    "fingers, toes" (10/05/2013)   Coronary artery disease    a. s/p MI with stent in 2000   Diverticulosis    Dysrhythmia    GERD (gastroesophageal reflux disease)    History of blood transfusion    "w/nephrectomy; w/shoulder replacement; w/knee replacement"   Hyperlipidemia    Hypertension    Left knee DJD    [place]   Neuropathy    feet bilat    NSTEMI (non-ST elevated myocardial infarction), type 2 due to demand ischemia 10/07/2013   PAF ([place]oxysmal atrial fibrillation) [place]    Pneumonia ~ 2007   again 3 months ago    Primary localized osteoarthritis of right knee    [place]    a. s/p nephrectomy   Shortness of breath    a. related to chemo drugs   Tendonitis    currently on prednisone and wearing brace (left wrist)   Type II diabetes mellitus ([place]    "treating w/diet and weight loss"   Past Surgical History:  Procedure Laterality Date   CARDIAC CATHETERIZATION  05/2009   CARDIOVERSION N/A 10/08/2013   Procedure: CARDIOVERSION;  Surgeon: KDorothy Spark MD;  [place]  Service: Cardiovascular;  Laterality: N/A;   CHEILECTOMY Right 04/13/2013  Procedure: Right Great Toe Cheilectomy, Debride Tendon and Abscess, Apply Stimulan Beads;  Surgeon: Newt Minion, MD;  Location: [place];  Service: Orthopedics;  Laterality: Right;  Right Great Toe Cheilectomy, Debride Tendon and Abscess, Apply Stimulan Beads   colonscopy      CORONARY ANGIOPLASTY WITH STENT PLACEMENT  1997   at [place] to [place]  09/22/2011   Procedure: HERNIA REPAIR INGUINAL ADULT BILATERAL;  Surgeon: Imogene Burn. Georgette Dover, MD;  [facility];  Service: General;  Laterality: Bilateral;   INGUINAL HERNIA REPAIR Bilateral 09/22/11   JOINT REPLACEMENT     KNEE ARTHROSCOPY Right ~ 2011   KNEE ARTHROSCOPY Left 11/13/2012   Procedure: ARTHROSCOPY KNEE;  Surgeon: Lorn Junes, MD;   Location: [place];  Service: Orthopedics;  Laterality: Left;   LUNG BIOPSY  2011   MASS EXCISION Right 04/12/2014   Procedure: MASS EXCISION DORSUM OF RIGHT FOOT  ;  Surgeon: Tobi Bastos, MD;  [facility];  Service: Orthopedics;  Laterality: Right;   NEPHRECTOMY Right 08/14/2009   ORCHIECTOMY  09/22/2011   Procedure: ORCHIECTOMY;  Surgeon: Franchot Gallo, MD;  [facility];  Service: Urology;  Laterality: Left;  Left Testicular Exploration, Inguinal Approach,  Testicular    PILONIDAL CYST DRAINAGE     TEE WITHOUT CARDIOVERSION N/A 10/08/2013   Procedure: TRANSESOPHAGEAL ECHOCARDIOGRAM (TEE);  Surgeon: Dorothy Spark, MD;  Location: [place];  Service: Cardiovascular;  Laterality: N/A;   [place] Left 11/13/2012   Procedure: TOTAL KNEE ARTHROPLASTY;  Surgeon: Lorn Junes, MD;  Location: [place];  Service: Orthopedics;  Laterality: Left;   TOTAL KNEE ARTHROPLASTY Right 07/22/2014   Procedure: RIGHT TOTAL KNEE ARTHROPLASTY;  Surgeon: Elsie Saas, MD;  Location: [place];  Service: Orthopedics;  Laterality: Right;   TOTAL SHOULDER REPLACEMENT Right 1997    Allergies  Allergies  Allergen Reactions   Penicillins Swelling   Ambien [Zolpidem Tartrate] Other (See Comments)    Causes hallucinations.    Codeine     hallucinations    Lisinopril Cough     EKGs/Labs/Other Studies Reviewed:   The following studies were reviewed today:  Lexiscan Myoview 07/29/2015  Addendum by Skeet Latch, MD on Tue Jul 29, 2015  6:41 PM Nuclear stress EF: 54%. There was no ST segment deviation noted during stress. Defect 1: There is a medium defect of severe severity present in the basal inferior, mid inferior, apical inferior and apex location. Findings consistent with prior myocardial infarction with a small amount of peri-infarct ischemia. This is a low risk study. The left ventricular ejection fraction is mildly decreased  (45-54%).   Finalized by Skeet Latch, MD on Tue Jul 29, 2015  6:40 PM Nuclear stress EF: 54%. There was no ST segment deviation noted during stress. Defect 1: There is a medium defect of severe severity present in the basal inferior, mid inferior, apical inferior and apex location. Findings consistent with prior myocardial infarction with a small amount of peri-infarct ischemia. This is an intermediate risk study. The left ventricular ejection fraction is mildly decreased (45-54%).  EKG:  EKG is  ordered today.  The ekg ordered today demonstrates NSR rate 72 bpm  Recent Labs: No results found for requested labs within last 365 days.  Recent Lipid Panel    Component Value Date/Time   CHOL 137 07/20/2017 1015   TRIG 190 (H) 07/20/2017 1015   HDL 35 (L)  07/20/2017 1015   CHOLHDL 3.9 07/20/2017 1015   CHOLHDL 3.1 10/06/2013 0442   VLDL 15 10/06/2013 0442   LDLCALC 64 07/20/2017 1015    Risk Assessment/Calculations:   CHA2DS2-VASc Score = 5   This indicates a 7.2% annual risk of stroke. The patient's score is based upon: CHF History: 1 HTN History: 1 Diabetes History: 0 Stroke History: 0 Vascular Disease History: 1 Age Score: 2 Gender Score: 0     Home Medications   Current Meds  Medication Sig   Alpha-Lipoic Acid 300 MG CAPS Take 300 mg by mouth 2 (two) times daily.   cetirizine (ZYRTEC) 10 MG tablet Take 10 mg by mouth daily. As needed  Alternates monthly between this and Allegra Pt currently taking Allegra   cholecalciferol (VITAMIN D) 1000 UNITS tablet Take 1,000 Units by mouth daily.   clindamycin (CLEOCIN) 150 MG capsule Take 4 capsules by mouth as needed. (Take one (1) hour prior to dental procedures)   fluticasone (FLONASE) 50 MCG/ACT nasal spray Place 1 spray into both nostrils daily.   magnesium gluconate (MAGONATE) 500 MG tablet Take 500 mg by mouth 2 (two) times daily.    Multiple Vitamin (MULTIVITAMIN WITH MINERALS) TABS Take 1 tablet by mouth daily.    Polyethyl Glycol-Propyl Glycol 0.4-0.3 % SOLN Place 1 drop into both eyes daily as needed (dry eyes).   vitamin C (ASCORBIC ACID) 500 MG tablet Take 500 mg by mouth daily.    [DISCONTINUED] atorvastatin (LIPITOR) 10 MG tablet Take 10 mg by mouth daily with breakfast.    [DISCONTINUED] metoprolol tartrate (LOPRESSOR) 25 MG tablet Take 1 tablet (25 mg total) by mouth 2 (two) times daily.   [DISCONTINUED] nitroGLYCERIN (NITROSTAT) 0.4 MG SL tablet Place 0.4 mg under the tongue every 5 (five) minutes as needed for chest pain.   [DISCONTINUED] rivaroxaban (XARELTO) 20 MG TABS tablet Take 1 tablet (20 mg total) by mouth daily.   [DISCONTINUED] telmisartan (MICARDIS) 80 MG tablet Take 80 mg by mouth every evening.     Review of Systems      All other systems reviewed and are otherwise negative except as noted above.  Physical Exam    VS:  BP 122/66   Pulse 72   Ht '5\' 11"'$  (1.803 m)   Wt 208 lb (94.3 kg)   SpO2 97%   BMI 29.01 kg/m  , BMI Body mass index is 29.01 kg/m.  Wt Readings from Last 3 Encounters:  11/25/21 208 lb (94.3 kg)  11/05/20 212 lb 9.6 oz (96.4 kg)  08/27/19 212 lb 1.9 oz (96.2 kg)     GEN: Well nourished, well developed, in no acute distress. HEENT: normal. Neck: Supple, no JVD, carotid bruits, or masses. Cardiac: RRR, no murmurs, rubs, or gallops. No clubbing, cyanosis, edema.  Radials/PT 2+ and equal bilaterally.  Respiratory:  Respirations regular and unlabored, clear to auscultation bilaterally. GI: Soft, nontender, nondistended. MS: No deformity or atrophy. Skin: Warm and dry, no rash. Neuro:  Strength and sensation are intact. Psych: Normal affect.  Assessment & Plan    Coronary artery disease status post PCI to RCA in 1995 -No chest pain but has been SOB every now and then -Continue GDMT: Lipitor 10 mg, Lopressor 25 mg twice a day, nitro sublingual tabs as needed, Xarelto 20 mg daily, telmisartan 80 mg daily  Paroxysmal atrial  fibrillation -Anticoagulated on Xarelto -He is in normal sinus rhythm today -Patient states he has been in normal sinus rhythm for a while and would  like to know when he can stop taking his Xarelto, will discuss with Dr. Tamala Julian  Chronic diastolic heart failure -Euvolemic on exam today -Continue current medication regimen -We will order echocardiogram for shortness of breath  Essential hypertension -Well-controlled today in the clinic -Continue to monitor at home -Continue current medication regimen  Hyperlipidemia -Lipids are managed by primary -He would like to try a period of time off of his Lipitor and potentially hear about some other options for cholesterol -Will arrange a follow-up with Pharm.D. for further discussion    Disposition: Follow up 6 months with Sinclair Grooms, MD or APP.  Signed, Elgie Collard, PA-C 11/25/2021, 2:03 PM [place]

## 2021-11-25 ENCOUNTER — Encounter: Payer: Self-pay | Admitting: Physician Assistant

## 2021-11-25 ENCOUNTER — Ambulatory Visit: Payer: Medicare HMO | Admitting: Physician Assistant

## 2021-11-25 VITALS — BP 122/66 | HR 72 | Ht 71.0 in | Wt 208.0 lb

## 2021-11-25 DIAGNOSIS — E785 Hyperlipidemia, unspecified: Secondary | ICD-10-CM

## 2021-11-25 DIAGNOSIS — I480 Paroxysmal atrial fibrillation: Secondary | ICD-10-CM | POA: Diagnosis not present

## 2021-11-25 DIAGNOSIS — I25119 Atherosclerotic heart disease of native coronary artery with unspecified angina pectoris: Secondary | ICD-10-CM | POA: Diagnosis not present

## 2021-11-25 DIAGNOSIS — Z7901 Long term (current) use of anticoagulants: Secondary | ICD-10-CM | POA: Diagnosis not present

## 2021-11-25 DIAGNOSIS — E7849 Other hyperlipidemia: Secondary | ICD-10-CM | POA: Diagnosis not present

## 2021-11-25 DIAGNOSIS — I5032 Chronic diastolic (congestive) heart failure: Secondary | ICD-10-CM | POA: Diagnosis not present

## 2021-11-25 MED ORDER — TELMISARTAN 80 MG PO TABS: 80.0000 mg | ORAL_TABLET | Freq: Every evening | ORAL | 3 refills | Status: DC

## 2021-11-25 MED ORDER — RIVAROXABAN 20 MG PO TABS: 20.0000 mg | ORAL_TABLET | Freq: Every day | ORAL | 3 refills | Status: DC

## 2021-11-25 MED ORDER — METOPROLOL TARTRATE 25 MG PO TABS: 25.0000 mg | ORAL_TABLET | Freq: Two times a day (BID) | ORAL | 3 refills | Status: DC

## 2021-11-25 MED ORDER — ATORVASTATIN CALCIUM 10 MG PO TABS: 10.0000 mg | ORAL_TABLET | Freq: Every day | ORAL | 3 refills | Status: DC

## 2021-11-25 MED ORDER — NITROGLYCERIN 0.4 MG SL SUBL
0.4000 mg | SUBLINGUAL_TABLET | SUBLINGUAL | 3 refills | Status: DC | PRN
Start: 2021-11-25 — End: 2023-11-03

## 2021-11-25 NOTE — Patient Instructions (Addendum)
Medication Instructions:  Your physician recommends that you continue on your current medications as directed. Please refer to the Current Medication list given to you today.  *If you need a refill on your cardiac medications before your next appointment, please call your pharmacy*  Lab Work: If you have labs (blood work) drawn today and your tests are completely normal, you will receive your results only by: [facility] (if you have MyChart) OR A paper copy in the mail If you have any lab test that is abnormal or we need to change your treatment, we will call you to review the results.  Testing/Procedures: Your physician has requested that you have an echocardiogram. Echocardiography is a painless test that uses sound waves to create images of your heart. It provides your doctor with information about the size and shape of your heart and how well your heart's chambers and valves are working. This procedure takes approximately one hour. There are no restrictions for this procedure.   Follow-Up: At [facility], you and your health needs are our priority.  As part of our continuing mission to provide you with exceptional heart care, we have created designated Provider Care Teams.  These Care Teams include your primary Cardiologist (physician) and Advanced Practice Providers (APPs -  Physician Assistants and Nurse Practitioners) who all work together to provide you with the care you need, when you need it.  We recommend signing up for the patient portal called "MyChart".  Sign up information is provided on this After Visit Summary.  MyChart is used to connect with patients for Virtual Visits (Telemedicine).  Patients are able to view lab/test results, encounter notes, upcoming appointments, etc.  Non-urgent messages can be sent to your provider as well.   To learn more about what you can do with MyChart, go to [email or web address].    Your next appointment:   6 month(s)  The format for  your next appointment:   In Person  Provider:   Sinclair Grooms, MD                   Other Instructions You have been referred to [facility].   Important Information About Sugar

## 2021-12-15 ENCOUNTER — Ambulatory Visit (HOSPITAL_COMMUNITY): Payer: Medicare HMO | Attending: Cardiovascular Disease

## 2021-12-15 DIAGNOSIS — E7849 Other hyperlipidemia: Secondary | ICD-10-CM | POA: Diagnosis not present

## 2021-12-15 DIAGNOSIS — I5032 Chronic diastolic (congestive) heart failure: Secondary | ICD-10-CM | POA: Insufficient documentation

## 2021-12-15 DIAGNOSIS — I25119 Atherosclerotic heart disease of native coronary artery with unspecified angina pectoris: Secondary | ICD-10-CM | POA: Diagnosis not present

## 2021-12-15 LAB — ECHOCARDIOGRAM COMPLETE
Area-P 1/2: 3.65 cm2
P 1/2 time: 489 msec
S' Lateral: 3.5 cm

## 2021-12-16 ENCOUNTER — Ambulatory Visit: Payer: Medicare HMO

## 2021-12-24 DIAGNOSIS — N529 Male erectile dysfunction, unspecified: Secondary | ICD-10-CM | POA: Diagnosis not present

## 2021-12-24 DIAGNOSIS — C649 Malignant neoplasm of unspecified kidney, except renal pelvis: Secondary | ICD-10-CM | POA: Diagnosis not present

## 2021-12-24 DIAGNOSIS — N433 Hydrocele, unspecified: Secondary | ICD-10-CM | POA: Diagnosis not present

## 2022-01-12 DIAGNOSIS — M5416 Radiculopathy, lumbar region: Secondary | ICD-10-CM | POA: Diagnosis not present

## 2022-01-12 DIAGNOSIS — Z6829 Body mass index (BMI) 29.0-29.9, adult: Secondary | ICD-10-CM | POA: Diagnosis not present

## 2022-01-14 DIAGNOSIS — M545 Low back pain, unspecified: Secondary | ICD-10-CM | POA: Diagnosis not present

## 2022-01-14 DIAGNOSIS — M5416 Radiculopathy, lumbar region: Secondary | ICD-10-CM | POA: Diagnosis not present

## 2022-01-14 DIAGNOSIS — M25551 Pain in right hip: Secondary | ICD-10-CM | POA: Diagnosis not present

## 2022-01-14 DIAGNOSIS — M4316 Spondylolisthesis, lumbar region: Secondary | ICD-10-CM | POA: Diagnosis not present

## 2022-01-14 DIAGNOSIS — M47816 Spondylosis without myelopathy or radiculopathy, lumbar region: Secondary | ICD-10-CM | POA: Diagnosis not present

## 2022-01-19 DIAGNOSIS — M48062 Spinal stenosis, lumbar region with neurogenic claudication: Secondary | ICD-10-CM | POA: Diagnosis not present

## 2022-01-19 DIAGNOSIS — Z6829 Body mass index (BMI) 29.0-29.9, adult: Secondary | ICD-10-CM | POA: Diagnosis not present

## 2022-01-19 DIAGNOSIS — M21371 Foot drop, right foot: Secondary | ICD-10-CM | POA: Diagnosis not present

## 2022-01-19 DIAGNOSIS — M21372 Foot drop, left foot: Secondary | ICD-10-CM | POA: Diagnosis not present

## 2022-02-08 NOTE — Progress Notes (Signed)
Cardiology Office Note:    Date:  02/11/2022   ID:  Donald Gray, DOB [birth year], MRN [id number]  PCP:  Lavone Orn, MD  Cardiologist:  Sinclair Grooms, MD   Referring MD: Lavone Orn, MD   Chief Complaint  Patient presents with   Coronary Artery Disease   Advice Only    Preoperative clearance Duration of Xarelto hold prior to surgery   Hypertension   Hyperlipidemia    History of Present Illness:    Donald Gray is a 81 y.o. male with a hx of CAD with remote stent in RCA1995, obesity, diet controlled DM, HLD, HTN, renal carcinoma treated with targeted chemotherapy, and PAF wiith cardioversion 2015 and CHADS-VASC  score of 4.   Wants to know when Xarelto can be can be held prior to the upcoming lumbar disc surgery.  He is looking forward to having lumbar surgery by Dr. Sherley Bounds.  It has not been set up.  The patient is here for clearance.  He also wants to know if he can permanently discontinue Xarelto.  He wants to be off of Xarelto because he is unable to use any anti-inflammatory therapy because of risk of bleeding.  He is almost at a point where he is willing to accept a slightly higher risk of CVA to be able to get some relief from the back discomfort which would then increase his quality of life and physical activity.  He has not had angina, orthopnea, PND, or significant change in lower extremity edema.  Past Medical History:  Diagnosis Date   Anemia    Anticoagulated- to go to Xarelto for a fib 10/07/2013   Arthritis    "fingers, toes" (10/05/2013)   Coronary artery disease    a. s/p MI with stent in 2000   Diverticulosis    Dysrhythmia    GERD (gastroesophageal reflux disease)    History of blood transfusion    "w/nephrectomy; w/shoulder replacement; w/knee replacement"   Hyperlipidemia    Hypertension    Left knee DJD    Myocardial infarction ([place]) 1997   Neuropathy    feet bilat    NSTEMI (non-ST elevated myocardial infarction), type 2  due to demand ischemia 10/07/2013   PAF (paroxysmal atrial fibrillation) ([place])    Pneumonia ~ 2007   again 3 months ago    Primary localized osteoarthritis of right knee    Renal cell carcinoma ([place])    a. s/p nephrectomy   Shortness of breath    a. related to chemo drugs   Tendonitis    currently on prednisone and wearing brace (left wrist)   Type II diabetes mellitus ([place])    "treating w/diet and weight loss"    Past Surgical History:  Procedure Laterality Date   CARDIAC CATHETERIZATION  05/2009   CARDIOVERSION N/A 10/08/2013   Procedure: CARDIOVERSION;  Surgeon: Dorothy Spark, MD;  Location: [place];  Service: Cardiovascular;  Laterality: N/A;   CHEILECTOMY Right 04/13/2013   Procedure: Right Great Toe Cheilectomy, Debride Tendon and Abscess, Apply Stimulan Beads;  Surgeon: Newt Minion, MD;  Location: [place];  Service: Orthopedics;  Laterality: Right;  Right Great Toe Cheilectomy, Debride Tendon and Abscess, Apply Stimulan Beads   colonscopy      CORONARY ANGIOPLASTY WITH STENT PLACEMENT  1997   at [place]  09/22/2011   Procedure: HERNIA REPAIR INGUINAL ADULT BILATERAL;  Surgeon: Imogene Burn. Tsuei, MD;  Location: [facility];  Service: General;  Laterality: Bilateral;   INGUINAL HERNIA REPAIR Bilateral 09/22/11   JOINT REPLACEMENT     KNEE ARTHROSCOPY Right ~ 2011   KNEE ARTHROSCOPY Left 11/13/2012   Procedure: ARTHROSCOPY KNEE;  Surgeon: Lorn Junes, MD;  Location: [place];  Service: Orthopedics;  Laterality: Left;   LUNG BIOPSY  2011   MASS EXCISION Right 04/12/2014   Procedure: MASS EXCISION DORSUM OF RIGHT FOOT  ;  Surgeon: Tobi Bastos, MD;  Location: [facility];  Service: Orthopedics;  Laterality: Right;   NEPHRECTOMY Right 08/14/2009   ORCHIECTOMY  09/22/2011   Procedure: ORCHIECTOMY;  Surgeon: Franchot Gallo, MD;  Location: [facility];  Service: Urology;  Laterality: Left;  Left Testicular Exploration, Inguinal Approach,  Testicular     PILONIDAL CYST DRAINAGE     TEE WITHOUT CARDIOVERSION N/A 10/08/2013   Procedure: TRANSESOPHAGEAL ECHOCARDIOGRAM (TEE);  Surgeon: Dorothy Spark, MD;  Location: [place];  Service: Cardiovascular;  Laterality: N/A;   [place] Left 11/13/2012   Procedure: TOTAL KNEE ARTHROPLASTY;  Surgeon: Lorn Junes, MD;  Location: [place];  Service: Orthopedics;  Laterality: Left;   TOTAL KNEE ARTHROPLASTY Right 07/22/2014   Procedure: RIGHT TOTAL KNEE ARTHROPLASTY;  Surgeon: Elsie Saas, MD;  Location: [place];  Service: Orthopedics;  Laterality: Right;   TOTAL SHOULDER REPLACEMENT Right 1997    Current Medications: Current Meds  Medication Sig   Alpha-Lipoic Acid 300 MG CAPS Take 300 mg by mouth 2 (two) times daily.   atorvastatin (LIPITOR) 10 MG tablet Take 1 tablet (10 mg total) by mouth daily with breakfast.   cetirizine (ZYRTEC) 10 MG tablet Take 10 mg by mouth daily. As needed  Alternates monthly between this and Allegra Pt currently taking Allegra   cholecalciferol (VITAMIN D) 1000 UNITS tablet Take 1,000 Units by mouth daily.   clindamycin (CLEOCIN) 150 MG capsule Take 4 capsules by mouth as needed. (Take one (1) hour prior to dental procedures)   fluticasone (FLONASE) 50 MCG/ACT nasal spray Place 1 spray into both nostrils daily.   magnesium gluconate (MAGONATE) 500 MG tablet Take 500 mg by mouth 2 (two) times daily.    metoprolol tartrate (LOPRESSOR) 25 MG tablet Take 1 tablet (25 mg total) by mouth 2 (two) times daily.   Multiple Vitamin (MULTIVITAMIN WITH MINERALS) TABS Take 1 tablet by mouth daily.   nitroGLYCERIN (NITROSTAT) 0.4 MG SL tablet Place 1 tablet (0.4 mg total) under the tongue every 5 (five) minutes as needed for chest pain.   Polyethyl Glycol-Propyl Glycol 0.4-0.3 % SOLN Place 1 drop into both eyes daily as needed (dry eyes).   rivaroxaban (XARELTO) 20 MG TABS tablet Take 1 tablet (20 mg total) by mouth daily.    telmisartan (MICARDIS) 80 MG tablet Take 1 tablet (80 mg total) by mouth every evening.   vitamin C (ASCORBIC ACID) 500 MG tablet Take 500 mg by mouth daily.      Allergies:   Penicillins, Ambien [zolpidem tartrate], Codeine, and Lisinopril   Social History   Socioeconomic History   Marital status: Married    Spouse name: Not on file   Number of children: Not on file   Years of education: Not on file   Highest education level: Not on file  Occupational History   Not on file  Tobacco Use   Smoking status: Former    Packs/day: 1.00    Years: 2.00    Total pack years: 2.00  Types: Cigarettes    Quit date: 05/03/1961    Years since quitting: 60.8   Smokeless tobacco: Never  Vaping Use   Vaping Use: Never used  Substance and Sexual Activity   Alcohol use: Yes    Alcohol/week: 1.0 standard drink of alcohol    Types: 1 Glasses of wine per week    Comment: occas glass of wine every 2 wks   Drug use: No   Sexual activity: Not Currently  Other Topics Concern   Not on file  Social History Narrative   Retired English as a second language teacher.   Lives with wife.   Social Determinants of Health   Financial Resource Strain: Not on file  Food Insecurity: Not on file  Transportation Needs: Not on file  Physical Activity: Not on file  Stress: Not on file  Social Connections: Not on file     Family History: The patient's family history includes Dementia in his mother; Healthy in his daughter; Heart disease in his father; Hypertension in his father; Skin cancer in his son; Thyroid disease in his brother and mother.  ROS:   Please see the history of present illness.    Denies angina.  Decreased exertional tolerance related to deconditioning per the patient.  He does not have orthopnea.  He is unable to do much because of chronic back discomfort that limits mobility and able to walk any distance.  All other systems reviewed and are negative.  EKGs/Labs/Other Studies Reviewed:    The following studies  were reviewed today: Recent cardiac work-up done included   2D Doppler echocardiogram 12/15/2021: IMPRESSIONS   1. Left ventricular ejection fraction, by estimation, is 55%. The left  ventricle has normal function. The left ventricle has no regional wall  motion abnormalities. The left ventricular internal cavity size was mildly  dilated. Left ventricular diastolic  parameters were normal.   2. Right ventricular systolic function is normal. The right ventricular  size is normal. There is normal pulmonary artery systolic pressure.   3. The mitral valve is abnormal. Mild mitral valve regurgitation. No  evidence of mitral stenosis.   4. The aortic valve is tricuspid. Aortic valve regurgitation is not  visualized. No aortic stenosis is present.   5. The inferior vena cava is normal in size with greater than 50%  respiratory variability, suggesting right atrial pressure of 3 mmHg.   EKG:  EKG 11/26/2021, demonstrated normal sinus rhythm with normal appearance.  Recent Labs: No results found for requested labs within last 365 days.  Recent Lipid Panel    Component Value Date/Time   CHOL 137 07/20/2017 1015   TRIG 190 (H) 07/20/2017 1015   HDL 35 (L) 07/20/2017 1015   CHOLHDL 3.9 07/20/2017 1015   CHOLHDL 3.1 10/06/2013 0442   VLDL 15 10/06/2013 0442   LDLCALC 64 07/20/2017 1015    Physical Exam:    VS:  BP 136/64   Pulse 66   Ht '5\' 11"'$  (1.803 m)   Wt 209 lb (94.8 kg)   SpO2 98%   BMI 29.15 kg/m     Wt Readings from Last 3 Encounters:  02/11/22 209 lb (94.8 kg)  11/25/21 208 lb (94.3 kg)  11/05/20 212 lb 9.6 oz (96.4 kg)     GEN: BMI 29. No acute distress HEENT: Normal NECK: No JVD. LYMPHATICS: No lymphadenopathy CARDIAC: Soft left lower sternal systolic murmur consistent with mitral regurgitation murmur. RRR S4 but no S3 gallop, or edema. VASCULAR:  Normal Pulses. No bruits. RESPIRATORY:  Clear to  auscultation without rales, wheezing or rhonchi  ABDOMEN: Soft,  non-tender, non-distended, No pulsatile mass, MUSCULOSKELETAL: No deformity  SKIN: Warm and dry NEUROLOGIC:  Alert and oriented x 3 PSYCHIATRIC:  Normal affect   ASSESSMENT:    1. Coronary artery disease involving native coronary artery of native heart with angina pectoris ([place])   2. Preoperative cardiovascular examination   3. Chronic diastolic heart failure (HCC)   4. Other hyperlipidemia   5. Paroxysmal atrial fibrillation (HCC)   6. Essential hypertension   7. Anticoagulated- to go to Xarelto for a fib    PLAN:    In order of problems listed above:  No angina.  Continue preventive therapy. Cleared for upcoming back surgery.  Okay to hold Xarelto 3 to 5 days prior to the surgery and resume when safe. No evidence of volume overload Continue statin therapy 2-week monitor to determine if the patient is having background atrial fibrillation.  If not, we may make the decision to discontinue anticoagulation therapy altogether.  He has only had 1 documented episode of A-fib required electrical cardioversion. Blood pressure is well controlled on metoprolol, and Micardis combination. Whether to continue Xarelto long-term is a question.  Based upon sustained atrial fibrillation in 2015 requiring cardioversion.  There have been no recurrences that we are aware of.  2-week monitor will be done as outlined above.  We will then have to make a shared decision depending upon the findings about whether to risk discontinuing anticoagulation.   We will need cardiology follow-up in 6 to 12 months with a new assigned cardiologist  Physician assignment unless the patient voices a particular choice.  My recommendation would be to consider Dr. Glenford Bayley   Medication Adjustments/Labs and Tests Ordered: Current medicines are reviewed at length with the patient today.  Concerns regarding medicines are outlined above.  Orders Placed This Encounter  Procedures   LONG TERM MONITOR (3-14 DAYS)   No  orders of the defined types were placed in this encounter.   Patient Instructions  Medication Instructions:  Your physician recommends that you continue on your current medications as directed. Please refer to the Current Medication list given to you today.  *If you need a refill on your cardiac medications before your next appointment, please call your pharmacy*  Lab Work: NONE  Testing/Procedures: Your physician has requested you wear a Zio heart monitor for 2 weeks (14 days). This will be mailed to your home with instructions on how to apply the monitor and how to return when finished. Please allow 2 weeks after returning heart monitor before our office calls you with the results.  Follow-Up: At [facility], you and your health needs are our priority.  As part of our continuing mission to provide you with exceptional heart care, we have created designated Provider Care Teams.  These Care Teams include your primary Cardiologist (physician) and Advanced Practice Providers (APPs -  Physician Assistants and Nurse Practitioners) who all work together to provide you with the care you need, when you need it.  Your next appointment:   6-12 month(s)  The format for your next appointment:   In Person  Provider:   Sinclair Grooms, MD   Other Instructions ZIO XT- Long Term Monitor Instructions     Dr. Tamala Julian has requested you wear a ZIO patch monitor for 14 days.  This is a single patch monitor. [employer] supplies one patch monitor per enrollment. Additional  stickers are not available. Please do not apply patch  if you will be having a Nuclear Stress Test,  Echocardiogram, Cardiac CT, MRI, or Chest Xray during the period you would be wearing the  monitor. The patch cannot be worn during these tests. You cannot remove and re-apply the  ZIO XT patch monitor.  Your ZIO patch monitor will be mailed 3 day USPS to your address on file. It may take 3-5 days  to receive your monitor  after you have been enrolled.  Once you have received your monitor, please review the enclosed instructions. Your monitor  has already been registered assigning a specific monitor serial # to you.     Billing and Patient Assistance Program Information     We have supplied [employer] with any of your insurance information on file for billing purposes.  [employer] offers a sliding scale Patient Assistance Program for patients that do not have  insurance, or whose insurance does not completely cover the cost of the ZIO monitor.  You must apply for the Patient Assistance Program to qualify for this discounted rate.  To apply, please call [employer] at [phone], select option 4, select option 2, ask to apply for  Patient Assistance Program. Theodore Demark will ask your household income, and how many people  are in your household. They will quote your out-of-pocket cost based on that information.  [employer] will also be able to set up a 58-month interest-free payment plan if needed.     Applying the monitor     Shave hair from upper left chest.  Hold abrader disc by orange tab. Rub abrader in 40 strokes over the upper left chest as  indicated in your monitor instructions.  Clean area with 4 enclosed alcohol pads. Let dry.  Apply patch as indicated in monitor instructions. Patch will be placed under collarbone on left  side of chest with arrow pointing upward.  Rub patch adhesive wings for 2 minutes. Remove white label marked "1". Remove the white  label marked "2". Rub patch adhesive wings for 2 additional minutes.  While looking in a mirror, press and release button in center of patch. A small green light will  flash 3-4 times. This will be your only indicator that the monitor has been turned on.  Do not shower for the first 24 hours. You may shower after the first 24 hours.  Press the button if you feel a symptom. You will hear a small click. Record Date, Time and  Symptom in the Patient Logbook.  When  you are ready to remove the patch, follow instructions on the last 2 pages of Patient  Logbook. Stick patch monitor onto the last page of Patient Logbook.  Place Patient Logbook in the blue and white box. Use locking tab on box and tape box closed  securely. The blue and white box has prepaid postage on it. Please place it in the mailbox as  soon as possible. Your physician should have your test results approximately 7 days after the  monitor [facility]  Call [employer]at [phone] you have questions regarding  your ZIO XT patch monitor. Call them immediately if you see an orange light blinking on your  monitor.  If your monitor falls off in less than 4 days, contact our Monitor department at [phone]  If your monitor becomes loose or falls off after 4 days call [employer] at [phone]  suggestions on securing your monitor.   Important Information About Sugar  Signed, Sinclair Grooms, MD  02/11/2022 5:19 PM    [place]

## 2022-02-11 ENCOUNTER — Ambulatory Visit: Payer: Medicare HMO | Attending: Interventional Cardiology | Admitting: Interventional Cardiology

## 2022-02-11 ENCOUNTER — Encounter: Payer: Self-pay | Admitting: Interventional Cardiology

## 2022-02-11 ENCOUNTER — Ambulatory Visit (INDEPENDENT_AMBULATORY_CARE_PROVIDER_SITE_OTHER): Payer: Medicare HMO

## 2022-02-11 VITALS — BP 136/64 | HR 66 | Ht 71.0 in | Wt 209.0 lb

## 2022-02-11 DIAGNOSIS — Z01810 Encounter for preprocedural cardiovascular examination: Secondary | ICD-10-CM | POA: Diagnosis not present

## 2022-02-11 DIAGNOSIS — Z7901 Long term (current) use of anticoagulants: Secondary | ICD-10-CM

## 2022-02-11 DIAGNOSIS — I10 Essential (primary) hypertension: Secondary | ICD-10-CM

## 2022-02-11 DIAGNOSIS — I5032 Chronic diastolic (congestive) heart failure: Secondary | ICD-10-CM | POA: Diagnosis not present

## 2022-02-11 DIAGNOSIS — I480 Paroxysmal atrial fibrillation: Secondary | ICD-10-CM | POA: Diagnosis not present

## 2022-02-11 DIAGNOSIS — I25119 Atherosclerotic heart disease of native coronary artery with unspecified angina pectoris: Secondary | ICD-10-CM | POA: Diagnosis not present

## 2022-02-11 DIAGNOSIS — E7849 Other hyperlipidemia: Secondary | ICD-10-CM | POA: Diagnosis not present

## 2022-02-11 NOTE — Patient Instructions (Addendum)
Medication Instructions:  Your physician recommends that you continue on your current medications as directed. Please refer to the Current Medication list given to you today.  *If you need a refill on your cardiac medications before your next appointment, please call your pharmacy*  Lab Work: NONE  Testing/Procedures: Your physician has requested you wear a Zio heart monitor for 2 weeks (14 days). This will be mailed to your home with instructions on how to apply the monitor and how to return when finished. Please allow 2 weeks after returning heart monitor before our office calls you with the results.  Follow-Up: At [facility], you and your health needs are our priority.  As part of our continuing mission to provide you with exceptional heart care, we have created designated Provider Care Teams.  These Care Teams include your primary Cardiologist (physician) and Advanced Practice Providers (APPs -  Physician Assistants and Nurse Practitioners) who all work together to provide you with the care you need, when you need it.  Your next appointment:   6-12 month(s)  The format for your next appointment:   In Person  Provider:   Sinclair Grooms, MD   Other Instructions ZIO XT- Long Term Monitor Instructions     Dr. Tamala Julian has requested you wear a ZIO patch monitor for 14 days.  This is a single patch monitor. [employer] supplies one patch monitor per enrollment. Additional  stickers are not available. Please do not apply patch if you will be having a Nuclear Stress Test,  Echocardiogram, Cardiac CT, MRI, or Chest Xray during the period you would be wearing the  monitor. The patch cannot be worn during these tests. You cannot remove and re-apply the  ZIO XT patch monitor.  Your ZIO patch monitor will be mailed 3 day USPS to your address on file. It may take 3-5 days  to receive your monitor after you have been enrolled.  Once you have received your monitor, please review the  enclosed instructions. Your monitor  has already been registered assigning a specific monitor serial # to you.     Billing and Patient Assistance Program Information     We have supplied [employer] with any of your insurance information on file for billing purposes.  [employer] offers a sliding scale Patient Assistance Program for patients that do not have  insurance, or whose insurance does not completely cover the cost of the ZIO monitor.  You must apply for the Patient Assistance Program to qualify for this discounted rate.  To apply, please call [employer] at [phone], select option 4, select option 2, ask to apply for  Patient Assistance Program. Theodore Demark will ask your household income, and how many people  are in your household. They will quote your out-of-pocket cost based on that information.  [employer] will also be able to set up a 52-month interest-free payment plan if needed.     Applying the monitor     Shave hair from upper left chest.  Hold abrader disc by orange tab. Rub abrader in 40 strokes over the upper left chest as  indicated in your monitor instructions.  Clean area with 4 enclosed alcohol pads. Let dry.  Apply patch as indicated in monitor instructions. Patch will be placed under collarbone on left  side of chest with arrow pointing upward.  Rub patch adhesive wings for 2 minutes. Remove white label marked "1". Remove the white  label marked "2". Rub patch adhesive wings for 2 additional minutes.  While looking in a mirror, press and release button in center of patch. A small green light will  flash 3-4 times. This will be your only indicator that the monitor has been turned on.  Do not shower for the first 24 hours. You may shower after the first 24 hours.  Press the button if you feel a symptom. You will hear a small click. Record Date, Time and  Symptom in the Patient Logbook.  When you are ready to remove the patch, follow instructions on the last 2 pages of Patient   Logbook. Stick patch monitor onto the last page of Patient Logbook.  Place Patient Logbook in the blue and white box. Use locking tab on box and tape box closed  securely. The blue and white box has prepaid postage on it. Please place it in the mailbox as  soon as possible. Your physician should have your test results approximately 7 days after the  monitor has been mailed back to [facility].  Call [facility] at [phone] if you have questions regarding  your ZIO XT patch monitor. Call them immediately if you see an orange light blinking on your  monitor.  If your monitor falls off in less than 4 days, contact our Monitor department at [phone].  If your monitor becomes loose or falls off after 4 days call [employer] at [phone] for  suggestions on securing your monitor.   Important Information About Sugar

## 2022-02-11 NOTE — Progress Notes (Unsigned)
Enrolled for [employer] to mail a ZIO XT long term holter monitor to the patients address on file.  

## 2022-02-15 DIAGNOSIS — I480 Paroxysmal atrial fibrillation: Secondary | ICD-10-CM | POA: Diagnosis not present

## 2022-02-25 DIAGNOSIS — Z23 Encounter for immunization: Secondary | ICD-10-CM | POA: Diagnosis not present

## 2022-03-01 ENCOUNTER — Other Ambulatory Visit: Payer: Self-pay | Admitting: Neurological Surgery

## 2022-03-02 DIAGNOSIS — L309 Dermatitis, unspecified: Secondary | ICD-10-CM | POA: Diagnosis not present

## 2022-03-06 DIAGNOSIS — I480 Paroxysmal atrial fibrillation: Secondary | ICD-10-CM | POA: Diagnosis not present

## 2022-03-09 NOTE — Pre-Procedure Instructions (Signed)
Surgical Instructions    Your procedure is scheduled on Wednesday, March 17, 2022 at 12:30 PM.  Report to [facility] Main Entrance "A" at 10:30 A.M., then check in with the Admitting office.  Call this number if you have problems the morning of surgery:  [phone]   If you have any questions prior to your surgery date call [phone]: Open Monday-Friday 8am-4pm  *If you experience any cold or flu symptoms such as cough, fever, chills, shortness of breath, etc. between now and your scheduled surgery, please notify us.*    Remember:  Do not eat or drink after midnight the night before your surgery    Take these medicines the morning of surgery with A SIP OF WATER:  atorvastatin (LIPITOR)  cetirizine (ZYRTEC)  metoprolol tartrate (LOPRESSOR)   IF NEEDED: fluticasone (FLONASE)  nitroGLYCERIN (NITROSTAT)  Polyethyl Glycol-Propyl Glycol   Follow your surgeon's instructions on when to stop rivaroxaban (XARELTO).  If no instructions were given by your surgeon then you will need to call the office to get those instructions.    As of today, STOP taking any Aspirin (unless otherwise instructed by your surgeon) Aleve, Naproxen, Ibuprofen, Motrin, Advil, Goody's, BC's, all herbal medications, fish oil, and all vitamins.   HOW TO MANAGE YOUR DIABETES BEFORE AND AFTER SURGERY  Why is it important to control my blood sugar before and after surgery? Improving blood sugar levels before and after surgery helps healing and can limit problems. A way of improving blood sugar control is eating a healthy diet by:  Eating less sugar and carbohydrates  Increasing activity/exercise  Talking with your doctor about reaching your blood sugar goals High blood sugars (greater than 180 mg/dL) can raise your risk of infections and slow your recovery, so you will need to focus on controlling your diabetes during the weeks before surgery. Make sure that the doctor who takes care of your diabetes  knows about your planned surgery including the date and location.  How do I manage my blood sugar before surgery? Check your blood sugar at least 4 times a day, starting 2 days before surgery, to make sure that the level is not too high or low.  Check your blood sugar the morning of your surgery when you wake up and every 2 hours until you get to the Short Stay unit.  If your blood sugar is less than 70 mg/dL, you will need to treat for low blood sugar: Do not take insulin. Treat a low blood sugar (less than 70 mg/dL) with  cup of clear juice (cranberry or apple), 4 glucose tablets, OR glucose gel. Recheck blood sugar in 15 minutes after treatment (to make sure it is greater than 70 mg/dL). If your blood sugar is not greater than 70 mg/dL on recheck, call [phone] for further instructions. Report your blood sugar to the short stay nurse when you get to Short Stay.  If you are admitted to the hospital after surgery: Your blood sugar will be checked by the staff and you will probably be given insulin after surgery (instead of oral diabetes medicines) to make sure you have good blood sugar levels. The goal for blood sugar control after surgery is 80-180 mg/dL.                      Do NOT Smoke (Tobacco/Vaping) for 24 hours prior to your procedure.  If you use a CPAP at night, you may bring your mask/headgear for your overnight stay.  Contacts, glasses, piercing's, hearing aid's, dentures or partials may not be worn into surgery, please bring cases for these belongings.    For patients admitted to the hospital, discharge time will be determined by your treatment team.   Patients discharged the day of surgery will not be allowed to drive home, and someone needs to stay with them for 24 hours.  SURGICAL WAITING ROOM VISITATION Patients having surgery or a procedure may have two support people in the waiting area. Visitors may stay in the waiting area during the procedure and switch out  with other visitors if needed. Children under the age of 54 must have an adult accompany them who is not the patient. If the patient needs to stay at the hospital during part of their recovery, the visitor guidelines for inpatient rooms apply.  Please refer to the [facility] website for the visitor guidelines for Inpatients (after your surgery is over and you are in a regular room).    Special instructions:   [facility]- Preparing For Surgery  Before surgery, you can play an important role. Because skin is not sterile, your skin needs to be as free of germs as possible. You can reduce the number of germs on your skin by washing with CHG (chlorahexidine gluconate) Soap before surgery.  CHG is an antiseptic cleaner which kills germs and bonds with the skin to continue killing germs even after washing.    Oral Hygiene is also important to reduce your risk of infection.  Remember - BRUSH YOUR TEETH THE MORNING OF SURGERY WITH YOUR REGULAR TOOTHPASTE  Please do not use if you have an allergy to CHG or antibacterial soaps. If your skin becomes reddened/irritated stop using the CHG.  Do not shave (including legs and underarms) for at least 48 hours prior to first CHG shower. It is OK to shave your face.  Please follow these instructions carefully.   Shower the NIGHT BEFORE SURGERY and the MORNING OF SURGERY  If you chose to wash your hair, wash your hair first as usual with your normal shampoo.  After you shampoo, rinse your hair and body thoroughly to remove the shampoo.  Use CHG Soap as you would any other liquid soap. You can apply CHG directly to the skin and wash gently with a scrungie or a clean washcloth.   Apply the CHG Soap to your body ONLY FROM THE NECK DOWN.  Do not use on open wounds or open sores. Avoid contact with your eyes, ears, mouth and genitals (private parts). Wash Face and genitals (private parts)  with your normal soap.   Wash thoroughly, paying special attention to the  area where your surgery will be performed.  Thoroughly rinse your body with warm water from the neck down.  DO NOT shower/wash with your normal soap after using and rinsing off the CHG Soap.  Pat yourself dry with a CLEAN TOWEL.  Wear CLEAN PAJAMAS to bed the night before surgery  Place CLEAN SHEETS on your bed the night before your surgery  DO NOT SLEEP WITH PETS.   Day of Surgery: Take a shower with CHG soap. Do not wear jewelry. Do not wear lotions, powders, perfumes/colognes, or deodorant. Men may shave face and neck. Do not bring valuables to the hospital.  [facility] is not responsible for any belongings or valuables. Wear Clean/Comfortable clothing the morning of surgery Do not apply any deodorants/lotions.   Remember to brush your teeth WITH YOUR REGULAR TOOTHPASTE.   Please read over  the following fact sheets that you were given.  If you received a COVID test during your pre-op visit  it is requested that you wear a mask when out in public, stay away from anyone that may not be feeling well and notify your surgeon if you develop symptoms. If you have been in contact with anyone that has tested positive in the last 10 days please notify you surgeon.

## 2022-03-10 ENCOUNTER — Encounter (HOSPITAL_COMMUNITY): Payer: Self-pay

## 2022-03-10 ENCOUNTER — Encounter (HOSPITAL_COMMUNITY)
Admission: RE | Admit: 2022-03-10 | Discharge: 2022-03-10 | Disposition: A | Discharge status: Home / Self Care | Payer: Medicare HMO | Source: Ambulatory Visit | Attending: Neurological Surgery | Admitting: Neurological Surgery

## 2022-03-10 ENCOUNTER — Other Ambulatory Visit: Payer: Self-pay

## 2022-03-10 VITALS — BP 154/76 | HR 62 | Temp 97.7°F | Resp 18 | Ht 71.0 in | Wt 210.0 lb

## 2022-03-10 DIAGNOSIS — Z01818 Encounter for other preprocedural examination: Secondary | ICD-10-CM | POA: Insufficient documentation

## 2022-03-10 LAB — BASIC METABOLIC PANEL
Anion gap: 4 — ABNORMAL LOW (ref 5–15)
BUN: 19 mg/dL (ref 8–23)
CO2: 25 mmol/L (ref 22–32)
Calcium: 8.9 mg/dL (ref 8.9–10.3)
Chloride: 107 mmol/L (ref 98–111)
Creatinine, Ser: 1.07 mg/dL (ref 0.61–1.24)
GFR, Estimated: >60 mL/min (ref >60)
Glucose, Bld: 169 mg/dL — ABNORMAL HIGH (ref 70–99)
Potassium: 4.5 mmol/L (ref 3.5–5.1)
Sodium: 136 mmol/L (ref 135–145)

## 2022-03-10 LAB — CBC
HCT: 43.5 % (ref 39.0–52.0)
Hemoglobin: 14.4 g/dL (ref 13.0–17.0)
MCH: 30.2 pg (ref 26.0–34.0)
MCHC: 33.1 g/dL (ref 30.0–36.0)
MCV: 91.2 fL (ref 80.0–100.0)
Platelets: 183 10*3/uL (ref 150–400)
RBC: 4.77 MIL/uL (ref 4.22–5.81)
RDW: 13 % (ref 11.5–15.5)
WBC: 5.3 10*3/uL (ref 4.0–10.5)
nRBC: 0 % (ref 0.0–0.2)

## 2022-03-10 LAB — GLUCOSE, CAPILLARY: Glucose-Capillary: 176 mg/dL — ABNORMAL HIGH (ref 70–99)

## 2022-03-10 LAB — SURGICAL PCR SCREEN
MRSA, PCR: NEGATIVE
Staphylococcus aureus: NEGATIVE

## 2022-03-10 LAB — PROTIME-INR
INR: 1.5 — ABNORMAL HIGH (ref 0.8–1.2)
Prothrombin Time: 18.1 seconds — ABNORMAL HIGH (ref 11.4–15.2)

## 2022-03-10 LAB — HEMOGLOBIN A1C
Hgb A1c MFr Bld: 6.7 % — ABNORMAL HIGH (ref 4.8–5.6)
Mean Plasma Glucose: 145.59 mg/dL

## 2022-03-10 NOTE — Progress Notes (Signed)
Notified Vanessa in Dr. Adah Salvage office of pt's PT 18.1 and INR 1.5 drawn today at pt's PAT appointment. Lorriane Shire will notify Dr. Ronnald Ramp. Chart forwarded to anesthesia for review.

## 2022-03-10 NOTE — Progress Notes (Signed)
PCP - Lavone Orn, MD Cardiologist - Mallie Mussel Smith,MD  PPM/ICD - Denies Device Orders -  Rep Notified -   Chest x-ray - 12/28/13 EKG - 11/25/21 Stress Test - 07/29/15 ECHO - 12/15/21 Cardiac Cath - 05/2009  Sleep Study -no  CPAP - no  Fasting Blood Sugar - 100-106 Checks Blood Sugar _one times a day  Last dose of GLP1 agonist-  na GLP1 instructions: na  Blood Thinner Instructions: pt states he was instructed to take last dose of Xarelto 03/10/2022 Aspirin Instructions:na  ERAS Protcol -no PRE-SURGERY Ensure or G2-   COVID TEST- na   Anesthesia review: yes-Cardiac history  Patient denies shortness of breath, fever, cough and chest pain at PAT appointment   All instructions explained to the patient, with a verbal understanding of the material. Patient agrees to go over the instructions while at home for a better understanding. Patient also instructed to wear a mask when out in public prior to surgery. . The opportunity to ask questions was provided.

## 2022-03-11 NOTE — Progress Notes (Signed)
Anesthesia Chart Review:  Case: [id number] Date/Time: 03/17/22 1215   Procedure: Laminectomy and Foraminotomy - L2-L3 - L3-L4 - L4-L5 (Back)   Anesthesia type: General   Pre-op diagnosis: Stenosis   Location: [facility] OR ROOM 18 / [facility] OR   Surgeons: Eustace Moore, MD       DISCUSSION: Patient is an 81 year old male scheduled for the above procedure.  History includes former smoker (quit 05/03/61), HTN HLD, CAD (MI s/p RCA stent ~ 1995 at [facility]; NSTEMI/demand ischemia 10/2013 in setting of afib with RVR), PAF (DCCV 10/08/13), DM2, diverticulosis, GERD, renal cancer (s/p right open nephrectomy with IVC repair 08/14/09, metastasis to the lung s/p IL-2 therapy followed by targeted therapy 2012 at [facility] in [place]; left radical orchiectomy 09/22/11, pathology benign), CKD (stage III), osteoarthritis (right TSA 03/16/00; left TKA 11/13/12; right TKA 07/22/14   ), hernia (bilateral IHR 09/22/11).  Last cardiology evaluation was on 02/11/22 with Dr. Tamala Julian for follow-up and preoperative evaluation. No angina, continue medical therapy for CAD. He cleared patient for back surgery with permission to hold Xarelto 3-5 days prior to surgery. Of note, Mr. Turnage also inquired if he could discontinue Xarelto all together since no known recurrent afib since 10/08/13 cardioversion. A two week event monitor ordered and done in 01/2022. Results showed 4 non-sustained SVG episodes (longest 24 seconds), < 1% PAC/PVC burden, no afib. Dr. Tamala Julian reviewed on 03/12/22 and felt that results were "reassuring but not a guarantee about recurrent AF. Okay to stop Xarelto if he is willing to accept a slightly higher risk of stroke that I am unable to quantify."  His PT was 18.1 and INR 1.5 on 03/10/22 which were taken while he was on Xarelto and can be associated with elevated PT/INR. He reported instructions to hold Xarelto after 03/10/22 dose. Since Xarelto will be held for > 5 days prior to surgery, I did not order a repeat PT/INR  and would instead defer decision to Dr. Ronnald Ramp. Lorriane Shire at Dr. Ronnald Ramp' office was notified of PT/INR results.   Anesthesia team to evaluate prior to surgery.    VS: BP (!) 154/76   Pulse 62   Temp 36.5 C   Resp 18   Ht '5\' 11"'$  (1.803 m)   Wt 95.3 kg   SpO2 99%   BMI 29.29 kg/m    PROVIDERS: Lavone Orn, MD is PCP  Daneen Schick, MD is cardiologist Tresa Endo, MD is urologist ([facility]) Emelia Salisbury, MD is HEM-ONC ([facility]) Brand Males, MD is pulmonologist. Last visit in 2014.   LABS: Preoperative labs noted.  (all labs ordered are listed, but only abnormal results are displayed)  Labs Reviewed  GLUCOSE, CAPILLARY - Abnormal; Notable for the following components:      Result Value   Glucose-Capillary 176 (*)    All other components within normal limits  PROTIME-INR - Abnormal; Notable for the following components:   Prothrombin Time 18.1 (*)    INR 1.5 (*)    All other components within normal limits  HEMOGLOBIN A1C - Abnormal; Notable for the following components:   Hgb A1c MFr Bld 6.7 (*)    All other components within normal limits  BASIC METABOLIC PANEL - Abnormal; Notable for the following components:   Glucose, Bld 169 (*)    Anion gap 4 (*)    All other components within normal limits  SURGICAL PCR SCREEN  CBC    IMAGES: MRI L-spine 01/14/22 ([facility]/PACS): IMPRESSION: 1. Diffuse lumbar spine spondylosis as  described above. 2. No acute osseous injury of the lumbar spine.  CT Abd/pelvis 06/16/21 ([facility]): 1. No evidence of metastatic disease or acute intra-abdominal abnormality.  2. Right nephrectomy.    EKG: 11/25/21: NSR   CV: Echo 12/15/21: IMPRESSIONS   1. Left ventricular ejection fraction, by estimation, is 55%. The left  ventricle has normal function. The left ventricle has no regional wall  motion abnormalities. The left ventricular internal cavity size was mildly  dilated. Left ventricular diastolic  parameters were normal.   2.  Right ventricular systolic function is normal. The right ventricular  size is normal. There is normal pulmonary artery systolic pressure.   3. The mitral valve is abnormal. Mild mitral valve regurgitation. No  evidence of mitral stenosis.   4. The aortic valve is tricuspid. Aortic valve regurgitation is not  visualized. No aortic stenosis is present.   5. The inferior vena cava is normal in size with greater than 50%  respiratory variability, suggesting right atrial pressure of 3 mmHg.    Long term ZioPatch Monitor 02/15/22-03/01/22:   NSR with average HR 70 bpm   Four nonsustained SVT episodes with longest 24 seconds, and fastest 174 bpm.   PAC and PVC burden < 1%   No atrial fibrillation   Patient had a min HR of 49 bpm, max HR of 174 bpm, and avg HR of 70 bpm. Predominant underlying rhythm was Sinus Rhythm. 4 Supraventricular Tachycardia runs occurred, the run with the fastest interval lasting 4 beats with a max rate of 174 bpm, the  longest lasting 24.3 secs with an avg rate of 105 bpm. Isolated SVEs were rare (<1.0%), SVE Couplets were rare (<1.0%), and SVE Triplets were rare (<1.0%). Isolated VEs were rare (<1.0%), VE Couplets were rare (<1.0%), and no VE Triplets were present.  Ventricular Bigeminy was present.  - Dr. Tamala Julian reviewed on 03/12/22 and wrote, "Let the patient know there is no significant arrhythmia. No atrial fibrillation seen. This is reassuring but not a guarantee about recurrent AF. Okay to stop Xarelto if he is willing to accept a slightly higher risk of stroke that I am unable to quantify."   Nuclear stress test 07/29/15: Nuclear stress EF: 54%. There was no ST segment deviation noted during stress. Defect 1: There is a medium defect of severe severity present in the basal inferior, mid inferior, apical inferior and apex location. Findings consistent with prior myocardial infarction with a small amount of peri-infarct ischemia. This is a low risk study. The left  ventricular ejection fraction is mildly decreased (45-54%).  Past Medical History:  Diagnosis Date   Anemia    Anticoagulated- to go to Xarelto for a fib 10/07/2013   Arthritis    "fingers, toes" (10/05/2013)   Coronary artery disease    a. s/p MI with stent in 2000   Diverticulosis    Dysrhythmia    GERD (gastroesophageal reflux disease)    History of blood transfusion    "w/nephrectomy; w/shoulder replacement; w/knee replacement"   Hyperlipidemia    Hypertension    Left knee DJD    Myocardial infarction ([place]) 1997   Neuropathy    feet bilat    NSTEMI (non-ST elevated myocardial infarction), type 2 due to demand ischemia 10/07/2013   PAF (paroxysmal atrial fibrillation) ([place])    Pneumonia ~ 2007   again 3 months ago    Primary localized osteoarthritis of right knee    Renal cell carcinoma ([place])    a. s/p nephrectomy  Shortness of breath    a. related to chemo drugs   Tendonitis    currently on prednisone and wearing brace (left wrist)   Type II diabetes mellitus ([place])    "treating w/diet and weight loss"    Past Surgical History:  Procedure Laterality Date   CARDIAC CATHETERIZATION  05/2009   CARDIOVERSION N/A 10/08/2013   Procedure: CARDIOVERSION;  Surgeon: Dorothy Spark, MD;  Location: [place];  Service: Cardiovascular;  Laterality: N/A;   CHEILECTOMY Right 04/13/2013   Procedure: Right Great Toe Cheilectomy, Debride Tendon and Abscess, Apply Stimulan Beads;  Surgeon: Newt Minion, MD;  Location: [place];  Service: Orthopedics;  Laterality: Right;  Right Great Toe Cheilectomy, Debride Tendon and Abscess, Apply Stimulan Beads   colonscopy      CORONARY ANGIOPLASTY WITH STENT PLACEMENT  1997   at [facility] [place]  09/22/2011   Procedure: HERNIA REPAIR INGUINAL ADULT BILATERAL;  Surgeon: Imogene Burn. Georgette Dover, MD;  Location: [facility];  Service: General;  Laterality: Bilateral;   INGUINAL HERNIA REPAIR Bilateral 09/22/11   JOINT REPLACEMENT     KNEE  ARTHROSCOPY Right ~ 2011   KNEE ARTHROSCOPY Left 11/13/2012   Procedure: ARTHROSCOPY KNEE;  Surgeon: Lorn Junes, MD;  Location: [place];  Service: Orthopedics;  Laterality: Left;   LUNG BIOPSY  2011   MASS EXCISION Right 04/12/2014   Procedure: MASS EXCISION DORSUM OF RIGHT FOOT  ;  Surgeon: Tobi Bastos, MD;  Location: [facility];  Service: Orthopedics;  Laterality: Right;   NEPHRECTOMY Right 08/14/2009   ORCHIECTOMY  09/22/2011   Procedure: ORCHIECTOMY;  Surgeon: Franchot Gallo, MD;  Location: [facility];  Service: Urology;  Laterality: Left;  Left Testicular Exploration, Inguinal Approach,  Testicular    PILONIDAL CYST DRAINAGE     TEE WITHOUT CARDIOVERSION N/A 10/08/2013   Procedure: TRANSESOPHAGEAL ECHOCARDIOGRAM (TEE);  Surgeon: Dorothy Spark, MD;  Location: [place];  Service: Cardiovascular;  Laterality: N/A;   [place] Left 11/13/2012   Procedure: TOTAL KNEE ARTHROPLASTY;  Surgeon: Lorn Junes, MD;  Location: [place];  Service: Orthopedics;  Laterality: Left;   TOTAL KNEE ARTHROPLASTY Right 07/22/2014   Procedure: RIGHT TOTAL KNEE ARTHROPLASTY;  Surgeon: Elsie Saas, MD;  Location: [place];  Service: Orthopedics;  Laterality: Right;   TOTAL SHOULDER REPLACEMENT Right 1997    MEDICATIONS:  atorvastatin (LIPITOR) 10 MG tablet   cetirizine (ZYRTEC) 10 MG tablet   cholecalciferol (VITAMIN D) 1000 UNITS tablet   clindamycin (CLEOCIN) 150 MG capsule   fluticasone (FLONASE) 50 MCG/ACT nasal spray   magnesium gluconate (MAGONATE) 500 MG tablet   metoprolol tartrate (LOPRESSOR) 25 MG tablet   Multiple Vitamin (MULTIVITAMIN WITH MINERALS) TABS   nitroGLYCERIN (NITROSTAT) 0.4 MG SL tablet   Polyethyl Glycol-Propyl Glycol 0.4-0.3 % SOLN   rivaroxaban (XARELTO) 20 MG TABS tablet   telmisartan (MICARDIS) 80 MG tablet   vitamin C (ASCORBIC ACID) 500 MG tablet   No current facility-administered medications for this encounter.     Myra Gianotti, PA-C Surgical Short Stay/Anesthesiology [facility] Phone [phone] [facility] Phone [phone] 03/12/2022 3:04 PM

## 2022-03-12 ENCOUNTER — Encounter (HOSPITAL_COMMUNITY): Payer: Self-pay

## 2022-03-12 NOTE — Anesthesia Preprocedure Evaluation (Addendum)
Anesthesia Evaluation  Patient identified by MRN, date of birth, ID band Patient awake    Reviewed: Allergy & Precautions, H&P , NPO status , Patient's Chart, lab work & pertinent test results  Airway Mallampati: II  TM Distance: >3 FB Neck ROM: Full    Dental no notable dental hx.    Pulmonary neg pulmonary ROS, former smoker   Pulmonary exam normal breath sounds clear to auscultation       Cardiovascular hypertension, Pt. on medications + CAD and + Past MI  Normal cardiovascular exam Rhythm:Regular Rate:Normal  Echo 12/15/21: IMPRESSIONS  1. Left ventricular ejection fraction, by estimation, is 55%. The left  ventricle has normal function. The left ventricle has no regional wall  motion abnormalities. The left ventricular internal cavity size was mildly  dilated. Left ventricular diastolic  parameters were normal.  2. Right ventricular systolic function is normal. The right ventricular  size is normal. There is normal pulmonary artery systolic pressure.  3. The mitral valve is abnormal. Mild mitral valve regurgitation. No  evidence of mitral stenosis.  4. The aortic valve is tricuspid. Aortic valve regurgitation is not  visualized. No aortic stenosis is present.  5. The inferior vena cava is normal in size with greater than 50%  respiratory variability, suggesting right atrial pressure of 3 mmHg.      Neuro/Psych negative neurological ROS  negative psych ROS   GI/Hepatic Neg liver ROS,GERD  ,,  Endo/Other  negative endocrine ROSdiabetes, Type 2    Renal/GU negative Renal ROS  negative genitourinary   Musculoskeletal  (+) Arthritis , Osteoarthritis,    Abdominal   Peds negative pediatric ROS (+)  Hematology negative hematology ROS (+)   Anesthesia Other Findings   Reproductive/Obstetrics negative OB ROS                             Anesthesia Physical Anesthesia Plan  ASA:  3  Anesthesia Plan: General   Post-op Pain Management: Tylenol PO (pre-op)*, Gabapentin PO (pre-op)* and Ketamine IV*   Induction: Intravenous  PONV Risk Score and Plan: 2 and Ondansetron, Midazolam and Treatment may vary due to age or medical condition  Airway Management Planned: Oral ETT  Additional Equipment:   Intra-op Plan:   Post-operative Plan: Extubation in OR  Informed Consent: I have reviewed the patients History and Physical, chart, labs and discussed the procedure including the risks, benefits and alternatives for the proposed anesthesia with the patient or authorized representative who has indicated his/her understanding and acceptance.     Dental advisory given  Plan Discussed with: CRNA  Anesthesia Plan Comments: (PAT note written 03/12/2022 by Shonna Chock, PA-C.  )       Anesthesia Quick Evaluation

## 2022-03-17 ENCOUNTER — Ambulatory Visit (HOSPITAL_COMMUNITY): Payer: Medicare HMO | Admitting: Vascular Surgery

## 2022-03-17 ENCOUNTER — Ambulatory Visit (HOSPITAL_COMMUNITY)
Admission: RE | Admit: 2022-03-17 | Discharge: 2022-03-18 | Disposition: A | Discharge status: Home / Self Care | Payer: Medicare HMO | Source: Ambulatory Visit | Attending: Neurological Surgery | Admitting: Neurological Surgery

## 2022-03-17 ENCOUNTER — Ambulatory Visit (HOSPITAL_BASED_OUTPATIENT_CLINIC_OR_DEPARTMENT_OTHER): Payer: Medicare HMO | Admitting: Anesthesiology

## 2022-03-17 ENCOUNTER — Ambulatory Visit (HOSPITAL_COMMUNITY): Payer: Medicare HMO

## 2022-03-17 ENCOUNTER — Encounter (HOSPITAL_COMMUNITY)
Admission: RE | Disposition: A | Discharge status: Home / Self Care | Payer: Self-pay | Source: Ambulatory Visit | Attending: Neurological Surgery

## 2022-03-17 ENCOUNTER — Other Ambulatory Visit: Payer: Self-pay

## 2022-03-17 ENCOUNTER — Encounter (HOSPITAL_COMMUNITY): Payer: Self-pay | Admitting: Neurological Surgery

## 2022-03-17 DIAGNOSIS — Z87891 Personal history of nicotine dependence: Secondary | ICD-10-CM | POA: Diagnosis not present

## 2022-03-17 DIAGNOSIS — M48062 Spinal stenosis, lumbar region with neurogenic claudication: Secondary | ICD-10-CM | POA: Diagnosis not present

## 2022-03-17 DIAGNOSIS — I252 Old myocardial infarction: Secondary | ICD-10-CM | POA: Diagnosis not present

## 2022-03-17 DIAGNOSIS — Z9889 Other specified postprocedural states: Secondary | ICD-10-CM

## 2022-03-17 DIAGNOSIS — I2510 Atherosclerotic heart disease of native coronary artery without angina pectoris: Secondary | ICD-10-CM

## 2022-03-17 DIAGNOSIS — Z0389 Encounter for observation for other suspected diseases and conditions ruled out: Secondary | ICD-10-CM | POA: Diagnosis not present

## 2022-03-17 DIAGNOSIS — I10 Essential (primary) hypertension: Secondary | ICD-10-CM | POA: Diagnosis not present

## 2022-03-17 DIAGNOSIS — Z01818 Encounter for other preprocedural examination: Secondary | ICD-10-CM

## 2022-03-17 DIAGNOSIS — E119 Type 2 diabetes mellitus without complications: Secondary | ICD-10-CM | POA: Diagnosis not present

## 2022-03-17 DIAGNOSIS — M48061 Spinal stenosis, lumbar region without neurogenic claudication: Secondary | ICD-10-CM | POA: Diagnosis not present

## 2022-03-17 HISTORY — PX: LUMBAR LAMINECTOMY/DECOMPRESSION MICRODISCECTOMY: SHX5026

## 2022-03-17 LAB — GLUCOSE, CAPILLARY
Glucose-Capillary: 139 mg/dL — ABNORMAL HIGH (ref 70–99)
Glucose-Capillary: 102 mg/dL — ABNORMAL HIGH (ref 70–99)
Glucose-Capillary: 110 mg/dL — ABNORMAL HIGH (ref 70–99)

## 2022-03-17 SURGERY — LUMBAR LAMINECTOMY/DECOMPRESSION MICRODISCECTOMY 3 LEVELS
Anesthesia: General | Site: Back

## 2022-03-17 MED ORDER — PROPOFOL 10 MG/ML IV BOLUS
INTRAVENOUS | Status: AC
  Filled 2022-03-17: qty 20

## 2022-03-17 MED ORDER — METHOCARBAMOL 500 MG PO TABS
500.0000 mg | ORAL_TABLET | Freq: Four times a day (QID) | ORAL | Status: DC | PRN
  Administered 2022-03-17 – 2022-03-18 (×2): 500 mg via ORAL
  Filled 2022-03-17 (×2): qty 1

## 2022-03-17 MED ORDER — SENNA 8.6 MG PO TABS
1.0000 | ORAL_TABLET | Freq: Two times a day (BID) | ORAL | Status: DC
  Administered 2022-03-17 – 2022-03-18 (×2): 8.6 mg via ORAL
  Filled 2022-03-17 (×2): qty 1

## 2022-03-17 MED ORDER — ACETAMINOPHEN 500 MG PO TABS
1000.0000 mg | ORAL_TABLET | Freq: Four times a day (QID) | ORAL | Status: DC
  Administered 2022-03-17 – 2022-03-18 (×3): 1000 mg via ORAL
  Filled 2022-03-17 (×3): qty 2

## 2022-03-17 MED ORDER — SUGAMMADEX SODIUM 200 MG/2ML IV SOLN
INTRAVENOUS | Status: DC | PRN
  Administered 2022-03-17: 200 mg via INTRAVENOUS

## 2022-03-17 MED ORDER — METHOCARBAMOL 1000 MG/10ML IJ SOLN: 500.0000 mg | Freq: Four times a day (QID) | INTRAVENOUS | Status: DC | PRN

## 2022-03-17 MED ORDER — THROMBIN 20000 UNITS EX SOLR: CUTANEOUS | Status: DC | PRN

## 2022-03-17 MED ORDER — OXYCODONE HCL 5 MG/5ML PO SOLN: 5.0000 mg | Freq: Once | ORAL | Status: DC | PRN

## 2022-03-17 MED ORDER — PROPOFOL 10 MG/ML IV BOLUS
INTRAVENOUS | Status: DC | PRN
  Administered 2022-03-17: 150 mg via INTRAVENOUS

## 2022-03-17 MED ORDER — AMISULPRIDE (ANTIEMETIC) 5 MG/2ML IV SOLN: 10.0000 mg | Freq: Once | INTRAVENOUS | Status: DC | PRN

## 2022-03-17 MED ORDER — ORAL CARE MOUTH RINSE: 15.0000 mL | Freq: Once | OROMUCOSAL | Status: AC

## 2022-03-17 MED ORDER — BUPIVACAINE HCL (PF) 0.25 % IJ SOLN
INTRAMUSCULAR | Status: AC
  Filled 2022-03-17: qty 30

## 2022-03-17 MED ORDER — MENTHOL 3 MG MT LOZG: 1.0000 | LOZENGE | OROMUCOSAL | Status: DC | PRN

## 2022-03-17 MED ORDER — VANCOMYCIN HCL IN DEXTROSE 1-5 GM/200ML-% IV SOLN
1000.0000 mg | INTRAVENOUS | Status: AC
  Administered 2022-03-17: 1000 mg via INTRAVENOUS
  Filled 2022-03-17: qty 200

## 2022-03-17 MED ORDER — CHLORHEXIDINE GLUCONATE CLOTH 2 % EX PADS: 6.0000 | MEDICATED_PAD | Freq: Once | CUTANEOUS | Status: DC

## 2022-03-17 MED ORDER — CHLORHEXIDINE GLUCONATE 0.12 % MT SOLN
15.0000 mL | Freq: Once | OROMUCOSAL | Status: AC
  Administered 2022-03-17: 15 mL via OROMUCOSAL
  Filled 2022-03-17: qty 15

## 2022-03-17 MED ORDER — ROCURONIUM BROMIDE 10 MG/ML (PF) SYRINGE
PREFILLED_SYRINGE | INTRAVENOUS | Status: DC | PRN
  Administered 2022-03-17: 100 mg via INTRAVENOUS

## 2022-03-17 MED ORDER — BUPIVACAINE HCL (PF) 0.25 % IJ SOLN
INTRAMUSCULAR | Status: DC | PRN
  Administered 2022-03-17: 5 mL
  Administered 2022-03-17: 10 mL

## 2022-03-17 MED ORDER — POTASSIUM CHLORIDE IN NACL 20-0.9 MEQ/L-% IV SOLN: INTRAVENOUS | Status: DC

## 2022-03-17 MED ORDER — OXYCODONE HCL 5 MG PO TABS: 5.0000 mg | ORAL_TABLET | Freq: Once | ORAL | Status: DC | PRN

## 2022-03-17 MED ORDER — VANCOMYCIN HCL 1250 MG/250ML IV SOLN
1250.0000 mg | Freq: Once | INTRAVENOUS | Status: AC
  Administered 2022-03-17: 1250 mg via INTRAVENOUS
  Filled 2022-03-17: qty 250

## 2022-03-17 MED ORDER — IRBESARTAN 150 MG PO TABS
75.0000 mg | ORAL_TABLET | Freq: Every day | ORAL | Status: DC
  Administered 2022-03-18: 75 mg via ORAL
  Filled 2022-03-17 (×2): qty 1

## 2022-03-17 MED ORDER — SODIUM CHLORIDE 0.9% FLUSH
3.0000 mL | Freq: Two times a day (BID) | INTRAVENOUS | Status: DC
  Administered 2022-03-17: 3 mL via INTRAVENOUS

## 2022-03-17 MED ORDER — SODIUM CHLORIDE 0.9% FLUSH: 3.0000 mL | INTRAVENOUS | Status: DC | PRN

## 2022-03-17 MED ORDER — HYDROMORPHONE HCL 1 MG/ML IJ SOLN: 0.2500 mg | INTRAMUSCULAR | Status: DC | PRN

## 2022-03-17 MED ORDER — PROMETHAZINE HCL 25 MG/ML IJ SOLN: 6.2500 mg | INTRAMUSCULAR | Status: DC | PRN

## 2022-03-17 MED ORDER — ONDANSETRON HCL 4 MG/2ML IJ SOLN
INTRAMUSCULAR | Status: DC | PRN
  Administered 2022-03-17: 4 mg via INTRAVENOUS

## 2022-03-17 MED ORDER — LIDOCAINE 2% (20 MG/ML) 5 ML SYRINGE
INTRAMUSCULAR | Status: DC | PRN
  Administered 2022-03-17: 100 mg via INTRAVENOUS

## 2022-03-17 MED ORDER — GABAPENTIN 300 MG PO CAPS
300.0000 mg | ORAL_CAPSULE | ORAL | Status: AC
  Administered 2022-03-17: 300 mg via ORAL
  Filled 2022-03-17: qty 1

## 2022-03-17 MED ORDER — CELECOXIB 200 MG PO CAPS
200.0000 mg | ORAL_CAPSULE | Freq: Two times a day (BID) | ORAL | Status: DC
  Administered 2022-03-17 – 2022-03-18 (×2): 200 mg via ORAL
  Filled 2022-03-17 (×2): qty 1

## 2022-03-17 MED ORDER — HYDROMORPHONE HCL 1 MG/ML IJ SOLN
INTRAMUSCULAR | Status: AC
  Filled 2022-03-17: qty 0.5

## 2022-03-17 MED ORDER — THROMBIN 5000 UNITS EX SOLR
CUTANEOUS | Status: AC
  Filled 2022-03-17: qty 5000

## 2022-03-17 MED ORDER — THROMBIN 5000 UNITS EX SOLR: OROMUCOSAL | Status: DC | PRN

## 2022-03-17 MED ORDER — CEFAZOLIN SODIUM-DEXTROSE 1-4 GM/50ML-% IV SOLN
INTRAVENOUS | Status: DC | PRN
  Administered 2022-03-17: 1.9 g via INTRAVENOUS
  Administered 2022-03-17: .1 g via INTRAVENOUS

## 2022-03-17 MED ORDER — 0.9 % SODIUM CHLORIDE (POUR BTL) OPTIME
TOPICAL | Status: DC | PRN
  Administered 2022-03-17: 1000 mL

## 2022-03-17 MED ORDER — MORPHINE SULFATE (PF) 2 MG/ML IV SOLN: 2.0000 mg | INTRAVENOUS | Status: DC | PRN

## 2022-03-17 MED ORDER — METOPROLOL TARTRATE 25 MG PO TABS
25.0000 mg | ORAL_TABLET | Freq: Two times a day (BID) | ORAL | Status: DC
  Administered 2022-03-17 – 2022-03-18 (×2): 25 mg via ORAL
  Filled 2022-03-17 (×2): qty 1

## 2022-03-17 MED ORDER — ACETAMINOPHEN 500 MG PO TABS
1000.0000 mg | ORAL_TABLET | ORAL | Status: AC
  Administered 2022-03-17: 1000 mg via ORAL
  Filled 2022-03-17: qty 2

## 2022-03-17 MED ORDER — OXYCODONE HCL 5 MG PO TABS
5.0000 mg | ORAL_TABLET | ORAL | Status: DC | PRN
  Administered 2022-03-17 – 2022-03-18 (×3): 5 mg via ORAL
  Filled 2022-03-17 (×4): qty 1

## 2022-03-17 MED ORDER — HYDROMORPHONE HCL 1 MG/ML IJ SOLN
INTRAMUSCULAR | Status: DC | PRN
  Administered 2022-03-17: .5 mg via INTRAVENOUS

## 2022-03-17 MED ORDER — PHENOL 1.4 % MT LIQD: 1.0000 | OROMUCOSAL | Status: DC | PRN

## 2022-03-17 MED ORDER — THROMBIN 20000 UNITS EX SOLR
CUTANEOUS | Status: AC
  Filled 2022-03-17: qty 20000

## 2022-03-17 MED ORDER — PHENYLEPHRINE HCL-NACL 20-0.9 MG/250ML-% IV SOLN
INTRAVENOUS | Status: DC | PRN
  Administered 2022-03-17: 25 ug/min via INTRAVENOUS

## 2022-03-17 MED ORDER — FENTANYL CITRATE (PF) 250 MCG/5ML IJ SOLN
INTRAMUSCULAR | Status: DC | PRN
  Administered 2022-03-17: 100 ug via INTRAVENOUS

## 2022-03-17 MED ORDER — LACTATED RINGERS IV SOLN: INTRAVENOUS | Status: DC

## 2022-03-17 MED ORDER — SODIUM CHLORIDE 0.9 % IV SOLN
250.0000 mL | INTRAVENOUS | Status: DC
  Administered 2022-03-17: 250 mL via INTRAVENOUS

## 2022-03-17 MED ORDER — ONDANSETRON HCL 4 MG/2ML IJ SOLN: 4.0000 mg | Freq: Four times a day (QID) | INTRAMUSCULAR | Status: DC | PRN

## 2022-03-17 MED ORDER — INSULIN ASPART 100 UNIT/ML IJ SOLN: 0.0000 [IU] | INTRAMUSCULAR | Status: DC | PRN

## 2022-03-17 MED ORDER — VANCOMYCIN HCL 1250 MG/250ML IV SOLN
1250.0000 mg | Freq: Once | INTRAVENOUS | Status: DC
  Filled 2022-03-17: qty 250

## 2022-03-17 MED ORDER — ONDANSETRON HCL 4 MG PO TABS: 4.0000 mg | ORAL_TABLET | Freq: Four times a day (QID) | ORAL | Status: DC | PRN

## 2022-03-17 MED ORDER — FENTANYL CITRATE (PF) 250 MCG/5ML IJ SOLN
INTRAMUSCULAR | Status: AC
  Filled 2022-03-17: qty 5

## 2022-03-17 SURGICAL SUPPLY — 40 items
BAG COUNTER SPONGE SURGICOUNT (BAG) ×1 IMPLANT
BAND RUBBER #18 3X1/16 STRL (MISCELLANEOUS) ×2 IMPLANT
BENZOIN TINCTURE PRP APPL 2/3 (GAUZE/BANDAGES/DRESSINGS) ×1 IMPLANT
BUR CARBIDE MATCH 3.0 (BURR) ×1 IMPLANT
CANISTER SUCT 3000ML PPV (MISCELLANEOUS) ×1 IMPLANT
DERMABOND ADVANCED .7 DNX12 (GAUZE/BANDAGES/DRESSINGS) IMPLANT
DRAPE LAPAROTOMY 100X72X124 (DRAPES) ×1 IMPLANT
DRAPE MICROSCOPE SLANT 54X150 (MISCELLANEOUS) ×1 IMPLANT
DRAPE SURG 17X23 STRL (DRAPES) ×1 IMPLANT
DRSG OPSITE POSTOP 4X6 (GAUZE/BANDAGES/DRESSINGS) IMPLANT
DURAPREP 26ML APPLICATOR (WOUND CARE) ×1 IMPLANT
ELECT REM PT RETURN 9FT ADLT (ELECTROSURGICAL) ×1
ELECTRODE REM PT RTRN 9FT ADLT (ELECTROSURGICAL) ×1 IMPLANT
GAUZE 4X4 16PLY ~~LOC~~+RFID DBL (SPONGE) IMPLANT
GLOVE BIO SURGEON STRL SZ7 (GLOVE) IMPLANT
GLOVE BIO SURGEON STRL SZ8 (GLOVE) ×1 IMPLANT
GLOVE BIOGEL PI IND STRL 7.0 (GLOVE) IMPLANT
GOWN STRL REUS W/ TWL LRG LVL3 (GOWN DISPOSABLE) IMPLANT
GOWN STRL REUS W/ TWL XL LVL3 (GOWN DISPOSABLE) ×1 IMPLANT
GOWN STRL REUS W/TWL 2XL LVL3 (GOWN DISPOSABLE) IMPLANT
GOWN STRL REUS W/TWL LRG LVL3 (GOWN DISPOSABLE)
GOWN STRL REUS W/TWL XL LVL3 (GOWN DISPOSABLE) ×1
HEMOSTAT POWDER KIT SURGIFOAM (HEMOSTASIS) ×1 IMPLANT
KIT BASIN OR (CUSTOM PROCEDURE TRAY) ×1 IMPLANT
KIT TURNOVER KIT B (KITS) ×1 IMPLANT
NDL HYPO 25X1 1.5 SAFETY (NEEDLE) ×1 IMPLANT
NDL SPNL 20GX3.5 QUINCKE YW (NEEDLE) IMPLANT
NEEDLE HYPO 25X1 1.5 SAFETY (NEEDLE) ×1 IMPLANT
NEEDLE SPNL 20GX3.5 QUINCKE YW (NEEDLE) IMPLANT
NS IRRIG 1000ML POUR BTL (IV SOLUTION) ×1 IMPLANT
PACK LAMINECTOMY NEURO (CUSTOM PROCEDURE TRAY) ×1 IMPLANT
PAD ARMBOARD 7.5X6 YLW CONV (MISCELLANEOUS) ×3 IMPLANT
STRIP CLOSURE SKIN 1/2X4 (GAUZE/BANDAGES/DRESSINGS) ×1 IMPLANT
SUT VIC AB 0 CT1 18XCR BRD8 (SUTURE) ×1 IMPLANT
SUT VIC AB 0 CT1 8-18 (SUTURE) ×1
SUT VIC AB 2-0 CP2 18 (SUTURE) ×1 IMPLANT
SUT VIC AB 3-0 SH 8-18 (SUTURE) ×1 IMPLANT
TOWEL GREEN STERILE (TOWEL DISPOSABLE) ×1 IMPLANT
TOWEL GREEN STERILE FF (TOWEL DISPOSABLE) ×1 IMPLANT
WATER STERILE IRR 1000ML POUR (IV SOLUTION) ×1 IMPLANT

## 2022-03-17 NOTE — Anesthesia Postprocedure Evaluation (Signed)
Anesthesia Post Note  Patient: Donald Gray  Procedure(s) Performed: Laminectomy and Foraminotomy - Lumbar two-three, Lumbar three-four, Lumbar four-five (Back)     Patient location during evaluation: PACU Anesthesia Type: General Level of consciousness: awake and alert Pain management: pain level controlled Vital Signs Assessment: post-procedure vital signs reviewed and stable Respiratory status: spontaneous breathing, nonlabored ventilation and respiratory function stable Cardiovascular status: blood pressure returned to baseline and stable Postop Assessment: no apparent nausea or vomiting Anesthetic complications: no   No notable events documented.  Last Vitals:  Vitals:   03/17/22 1645 03/17/22 1700  BP: 99/62 111/61  Pulse: (!) 56 (!) 57  Resp: 15 14  Temp:    SpO2: 100% 93%    Last Pain:  Vitals:   03/17/22 1624  TempSrc:   PainSc: 0-No pain                 Lynda Rainwater

## 2022-03-17 NOTE — Anesthesia Procedure Notes (Signed)
Procedure Name: Intubation Date/Time: 03/17/2022 2:30 PM  Performed by: Georgia Duff, CRNAPre-anesthesia Checklist: Patient identified, Emergency Drugs available, Suction available and Patient being monitored Patient Re-evaluated:Patient Re-evaluated prior to induction Oxygen Delivery Method: Circle System Utilized Preoxygenation: Pre-oxygenation with 100% oxygen Induction Type: IV induction Ventilation: Mask ventilation without difficulty Laryngoscope Size: Miller and 2 Grade View: Grade I Tube type: Oral Tube size: 7.5 mm Number of attempts: 1 Airway Equipment and Method: Stylet and Oral airway Placement Confirmation: ETT inserted through vocal cords under direct vision, positive ETCO2 and breath sounds checked- equal and bilateral Secured at: 21 cm Tube secured with: Tape Dental Injury: Teeth and Oropharynx as per pre-operative assessment

## 2022-03-17 NOTE — Op Note (Signed)
03/17/2022  4:15 PM  PATIENT:  Donald Gray  81 y.o. male  PRE-OPERATIVE DIAGNOSIS: Lumbar spinal stenosis L2-3 L3-4 L4-5 with back pain and neurogenic claudication  POST-OPERATIVE DIAGNOSIS:  same  PROCEDURE: Decompressive lumbar laminectomy, medial facetectomy foraminotomies L2-3 L3-4 L4-5  SURGEON:  Sherley Bounds, MD  ASSISTANTS: Dr Kathyrn Sheriff  ANESTHESIA:   General  EBL: 150 ml  Total I/O In: 1100 [I.V.:1000; IV Piggyback:100] Out: 150 [Blood:150]  BLOOD ADMINISTERED: none  DRAINS: None  SPECIMEN:  none  INDICATION FOR PROCEDURE: This patient presented with back pain with neurogenic claudication. Imaging showed lumbar spinal stenosis L2-3 L3-4 L4-5. The patient tried conservative measures without relief. Pain was debilitating. Recommended decompressive laminectomy. Patient understood the risks, benefits, and alternatives and potential outcomes and wished to proceed.  PROCEDURE DETAILS: The patient was taken to the operating room and after induction of adequate generalized endotracheal anesthesia, the patient was rolled into the prone position on the Wilson frame and all pressure points were padded. The lumbar region was cleaned and then prepped with DuraPrep and draped in the usual sterile fashion. 5 cc of local anesthesia was injected and then a dorsal midline incision was made and carried down to the lumbo sacral fascia. The fascia was opened and the paraspinous musculature was taken down in a subperiosteal fashion to expose L2-3 L3-4 and L4-5 bilaterally. Intraoperative x-ray confirmed my level, and then I moved the spinous processes of L2-L3 and L4 and used a combination of the high-speed drill and the Kerrison punches to perform a laminectomy, medial facetectomy, and foraminotomy at L2-3 L3-4 and L4-5 bilaterally. The underlying yellow ligament was opened and removed in a piecemeal fashion to expose the underlying dura and exiting nerve root at each level bilaterally. I  undercut the lateral recess and dissected down until I was medial to and distal to the pedicle at each level. The nerve root was well decompressed. I then palpated with a coronary dilator along the nerve root and into the foramen to assure adequate decompression. I felt no more compression of the nerve root. I irrigated with saline solution containing bacitracin. Achieved hemostasis with bipolar cautery, lined the dura with Gelfoam, and then closed the fascia with 0 Vicryl. I closed the subcutaneous tissues with 2-0 Vicryl and the subcuticular tissues with 3-0 Vicryl. The skin was then closed with benzoin and Steri-Strips. The drapes were removed, a sterile dressing was applied.  My nurse practitioner was involved in the exposure, safe retraction of the neural elements, the disc work and the closure. the patient was awakened from general anesthesia and transferred to the recovery room in stable condition. At the end of the procedure all sponge, needle and instrument counts were correct.    PLAN OF CARE: Admit for overnight observation  PATIENT DISPOSITION:  PACU - hemodynamically stable.   Delay start of Pharmacological VTE agent (>24hrs) due to surgical blood loss or risk of bleeding:  yes

## 2022-03-17 NOTE — H&P (Signed)
Subjective: Patient is a 81 y.o. male admitted for lumbar stenosis. Onset of symptoms was several months ago, gradually worsening since that time.  The pain is rated moderate, and is located at the across the lower back and radiates to legs. The pain is described as aching and occurs intermittently. The symptoms have been progressive. Symptoms are exacerbated by exercise and walking for more than a few minutes. MRI or CT showed lumbar stenosis   Past Medical History:  Diagnosis Date   Anemia    Anticoagulated- to go to Xarelto for a fib 10/07/2013   Arthritis    "fingers, toes" (10/05/2013)   Coronary artery disease    a. s/p MI with stent in 2000   Diverticulosis    Dysrhythmia    GERD (gastroesophageal reflux disease)    History of blood transfusion    "w/nephrectomy; w/shoulder replacement; w/knee replacement"   Hyperlipidemia    Hypertension    Left knee DJD    Myocardial infarction ([place]) 05/04/1995   Neuropathy    feet bilat    NSTEMI (non-ST elevated myocardial infarction), type 2 due to demand ischemia 10/07/2013   PAF (paroxysmal atrial fibrillation) ([place])    Pneumonia ~ 2007   again 3 months ago    Primary localized osteoarthritis of right knee    Renal cell carcinoma ([place])    a. s/p right nephrectomy 08/14/09   Shortness of breath    a. related to chemo drugs   Tendonitis    currently on prednisone and wearing brace (left wrist)   Type II diabetes mellitus ([place])    "treating w/diet and weight loss"    Past Surgical History:  Procedure Laterality Date   CARDIAC CATHETERIZATION  05/2009   CARDIOVERSION N/A 10/08/2013   Procedure: CARDIOVERSION;  Surgeon: Dorothy Spark, MD;  Location: [facility];  Service: Cardiovascular;  Laterality: N/A;   CHEILECTOMY Right 04/13/2013   Procedure: Right Great Toe Cheilectomy, Debride Tendon and Abscess, Apply Stimulan Beads;  Surgeon: Newt Minion, MD;  Location: [facility];  Service: Orthopedics;  Laterality: Right;  Right Great Toe  Cheilectomy, Debride Tendon and Abscess, Apply Stimulan Beads   colonscopy      CORONARY ANGIOPLASTY WITH STENT PLACEMENT  1997   at [facility] to [place]  09/22/2011   Procedure: HERNIA REPAIR INGUINAL ADULT BILATERAL;  Surgeon: Imogene Burn. Georgette Dover, MD;  Location: [facility];  Service: General;  Laterality: Bilateral;   INGUINAL HERNIA REPAIR Bilateral 09/22/11   JOINT REPLACEMENT     KNEE ARTHROSCOPY Right ~ 2011   KNEE ARTHROSCOPY Left 11/13/2012   Procedure: ARTHROSCOPY KNEE;  Surgeon: Lorn Junes, MD;  Location: [facility];  Service: Orthopedics;  Laterality: Left;   LUNG BIOPSY  2011   MASS EXCISION Right 04/12/2014   Procedure: MASS EXCISION DORSUM OF RIGHT FOOT  ;  Surgeon: Tobi Bastos, MD;  Location: [facility];  Service: Orthopedics;  Laterality: Right;   NEPHRECTOMY Right 08/14/2009   ORCHIECTOMY  09/22/2011   Procedure: ORCHIECTOMY;  Surgeon: Franchot Gallo, MD;  Location: [facility];  Service: Urology;  Laterality: Left;  Left Testicular Exploration, Inguinal Approach,  Testicular    PILONIDAL CYST DRAINAGE     TEE WITHOUT CARDIOVERSION N/A 10/08/2013   Procedure: TRANSESOPHAGEAL ECHOCARDIOGRAM (TEE);  Surgeon: Dorothy Spark, MD;  Location: [facility];  Service: Cardiovascular;  Laterality: N/A;   [place] Left 11/13/2012   Procedure: TOTAL KNEE  ARTHROPLASTY;  Surgeon: Lorn Junes, MD;  Location: [facility];  Service: Orthopedics;  Laterality: Left;   TOTAL KNEE ARTHROPLASTY Right 07/22/2014   Procedure: RIGHT TOTAL KNEE ARTHROPLASTY;  Surgeon: Elsie Saas, MD;  Location: [facility];  Service: Orthopedics;  Laterality: Right;   TOTAL SHOULDER REPLACEMENT Right 1997    Prior to Admission medications   Medication Sig Start Date End Date Taking? Authorizing Provider  atorvastatin (LIPITOR) 10 MG tablet Take 1 tablet (10 mg total) by mouth daily with breakfast. 11/25/21  Yes Harriet Pho, Tessa N, PA-C  cetirizine (ZYRTEC) 10 MG  tablet Take 10 mg by mouth daily. \   Yes [provider]  cholecalciferol (VITAMIN D) 1000 UNITS tablet Take 1,000 Units by mouth daily.   Yes [provider]  clindamycin (CLEOCIN) 150 MG capsule Take 600 mg by mouth See admin instructions. Take 600 mg by mouth one hour prior to dental treatment 06/16/15  Yes [provider]  fluticasone (FLONASE) 50 MCG/ACT nasal spray Place 1 spray into both nostrils daily as needed for allergies. 05/09/17  Yes [provider]  magnesium gluconate (MAGONATE) 500 MG tablet Take 500 mg by mouth 2 (two) times daily.    Yes [provider]  metoprolol tartrate (LOPRESSOR) 25 MG tablet Take 1 tablet (25 mg total) by mouth 2 (two) times daily. 11/25/21  Yes Conte, Tessa N, PA-C  Multiple Vitamin (MULTIVITAMIN WITH MINERALS) TABS Take 1 tablet by mouth daily.   Yes [provider]  nitroGLYCERIN (NITROSTAT) 0.4 MG SL tablet Place 1 tablet (0.4 mg total) under the tongue every 5 (five) minutes as needed for chest pain. 11/25/21  Yes Conte, Tessa N, PA-C  Polyethyl Glycol-Propyl Glycol 0.4-0.3 % SOLN Place 1 drop into both eyes daily as needed (dry eyes).   Yes [provider]  rivaroxaban (XARELTO) 20 MG TABS tablet Take 1 tablet (20 mg total) by mouth daily. 11/25/21  Yes Harriet Pho, Tessa N, PA-C  telmisartan (MICARDIS) 80 MG tablet Take 1 tablet (80 mg total) by mouth every evening. 11/25/21  Yes Conte, Tessa N, PA-C  vitamin C (ASCORBIC ACID) 500 MG tablet Take 500 mg by mouth daily.    Yes [provider]   Allergies  Allergen Reactions   Penicillins Swelling   Ambien [Zolpidem Tartrate] Other (See Comments)    Causes hallucinations.    Codeine     hallucinations    Lisinopril Cough    Social History   Tobacco Use   Smoking status: Former    Packs/day: 1.00    Years: 2.00    Total pack years: 2.00    Types: Cigarettes    Quit date: 05/03/1961    Years since quitting: 60.9   Smokeless tobacco:  Never  Substance Use Topics   Alcohol use: Yes    Alcohol/week: 1.0 standard drink of alcohol    Types: 1 Glasses of wine per week    Comment: occas glass of wine every 2 wks    Family History  Problem Relation Age of Onset   Heart disease Father        Died, 30   Hypertension Father    Thyroid disease Mother        Living, 84   Dementia Mother    Thyroid disease Brother    Healthy Daughter    Skin cancer Son      Review of Systems  Positive ROS: neg  All other systems have been reviewed and were otherwise negative with  the exception of those mentioned in the HPI and as above.  Objective: Vital signs in last 24 hours: Temp:  [97.6 F (36.4 C)] 97.6 F (36.4 C) (11/15 1036) Pulse Rate:  [67] 67 (11/15 1036) Resp:  [18] 18 (11/15 1036) SpO2:  [97 %] 97 % (11/15 1036) Weight:  [95.3 kg] 95.3 kg (11/15 1036)  General Appearance: Alert, cooperative, no distress, appears stated age Head: Normocephalic, without obvious abnormality, atraumatic Eyes: PERRL, conjunctiva/corneas clear, EOM's intact    Neck: Supple, symmetrical, trachea midline Back: Symmetric, no curvature, ROM normal, no CVA tenderness Lungs:  respirations unlabored Heart: Regular rate and rhythm Abdomen: Soft, non-tender Extremities: Extremities normal, atraumatic, no cyanosis or edema Pulses: 2+ and symmetric all extremities Skin: Skin color, texture, turgor normal, no rashes or lesions  NEUROLOGIC:   Mental status: Alert and oriented x4,  no aphasia, good attention span, fund of knowledge, and memory Motor Exam - grossly normal Sensory Exam - grossly normal Reflexes: 1+ Coordination - grossly normal Gait - grossly normal Balance - grossly normal Cranial Nerves: I: smell Not tested  II: visual acuity  OS: nl    OD: nl  II: visual fields Full to confrontation  II: pupils Equal, round, reactive to light  III,VII: ptosis None  III,IV,VI: extraocular muscles  Full ROM  V: mastication Normal  V:  facial light touch sensation  Normal  V,VII: corneal reflex  Present  VII: facial muscle function - upper  Normal  VII: facial muscle function - lower Normal  VIII: hearing Not tested  IX: soft palate elevation  Normal  IX,X: gag reflex Present  XI: trapezius strength  5/5  XI: sternocleidomastoid strength 5/5  XI: neck flexion strength  5/5  XII: tongue strength  Normal    Data Review Lab Results  Component Value Date   WBC 5.3 03/10/2022   HGB 14.4 03/10/2022   HCT 43.5 03/10/2022   MCV 91.2 03/10/2022   PLT 183 03/10/2022   Lab Results  Component Value Date   NA 136 03/10/2022   K 4.5 03/10/2022   CL 107 03/10/2022   CO2 25 03/10/2022   BUN 19 03/10/2022   CREATININE 1.07 03/10/2022   GLUCOSE 169 (H) 03/10/2022   Lab Results  Component Value Date   INR 1.5 (H) 03/10/2022    Assessment/Plan:  Estimated body mass index is 29.29 kg/m as calculated from the following:   Height as of this encounter: '5\' 11"'$  (1.803 m).   Weight as of this encounter: 95.3 kg. Patient admitted for LL L2-3 L3-4 L4-5. Patient has failed a reasonable attempt at conservative therapy.  I explained the condition and procedure to the patient and answered any questions.  Patient wishes to proceed with procedure as planned. Understands risks/ benefits and typical outcomes of procedure.   Eustace Moore 03/17/2022 1:32 PM

## 2022-03-17 NOTE — Transfer of Care (Signed)
Immediate Anesthesia Transfer of Care Note  Patient: Donald Gray  Procedure(s) Performed: Laminectomy and Foraminotomy - Lumbar two-three, Lumbar three-four, Lumbar four-five (Back)  Patient Location: PACU  Anesthesia Type:General  Level of Consciousness: drowsy  Airway & Oxygen Therapy: Patient Spontanous Breathing  Post-op Assessment: Report given to RN and Post -op Vital signs reviewed and stable  Post vital signs: Reviewed and stable  Last Vitals:  Vitals Value Taken Time  BP 115/58 03/17/22 1624  Temp 36.5 C 03/17/22 1624  Pulse 61 03/17/22 1630  Resp 14 03/17/22 1630  SpO2 96 % 03/17/22 1630  Vitals shown include unvalidated device data.  Last Pain:  Vitals:   03/17/22 1042  TempSrc:   PainSc: 0-No pain         Complications: No notable events documented.

## 2022-03-17 NOTE — Progress Notes (Signed)
Pharmacy Antibiotic Note  Donald Gray is a 81 y.o. male admitted on 03/17/2022 with surgical prophylaxis.  Pharmacy has been consulted for vancomycin dosing.  Plan: Vancomycin 1250 mg x 1 tonight at 11 pm.  Height: '5\' 11"'$  (180.3 cm) Weight: 95.3 kg (210 lb) IBW/kg (Calculated) : 75.3  Temp (24hrs), Avg:97.7 F (36.5 C), Min:97.6 F (36.4 C), Max:97.7 F (36.5 C)  No results for input(s): "WBC", "CREATININE", "LATICACIDVEN", "VANCOTROUGH", "VANCOPEAK", "VANCORANDOM", "GENTTROUGH", "GENTPEAK", "GENTRANDOM", "TOBRATROUGH", "TOBRAPEAK", "TOBRARND", "AMIKACINPEAK", "AMIKACINTROU", "AMIKACIN" in the last 168 hours.  Estimated Creatinine Clearance: 63.8 mL/min (by C-G formula based on SCr of 1.07 mg/dL).    Allergies  Allergen Reactions   Penicillins Swelling   Ambien [Zolpidem Tartrate] Other (See Comments)    Causes hallucinations.    Codeine     hallucinations    Lisinopril Cough      Thank you for allowing pharmacy to be a part of this patient's care.  [place] Crane, Roylene Reason, BCCP Clinical Pharmacist  03/17/2022 5:48 PM   [facility] pharmacy phone numbers are listed on [email or web address]

## 2022-03-18 ENCOUNTER — Other Ambulatory Visit: Payer: Self-pay

## 2022-03-18 ENCOUNTER — Encounter (HOSPITAL_COMMUNITY): Payer: Self-pay | Admitting: Neurological Surgery

## 2022-03-18 DIAGNOSIS — Z87891 Personal history of nicotine dependence: Secondary | ICD-10-CM | POA: Diagnosis not present

## 2022-03-18 DIAGNOSIS — M48062 Spinal stenosis, lumbar region with neurogenic claudication: Secondary | ICD-10-CM | POA: Diagnosis not present

## 2022-03-18 DIAGNOSIS — E119 Type 2 diabetes mellitus without complications: Secondary | ICD-10-CM | POA: Diagnosis not present

## 2022-03-18 DIAGNOSIS — I10 Essential (primary) hypertension: Secondary | ICD-10-CM | POA: Diagnosis not present

## 2022-03-18 DIAGNOSIS — I2510 Atherosclerotic heart disease of native coronary artery without angina pectoris: Secondary | ICD-10-CM | POA: Diagnosis not present

## 2022-03-18 DIAGNOSIS — I252 Old myocardial infarction: Secondary | ICD-10-CM | POA: Diagnosis not present

## 2022-03-18 MED ORDER — OXYCODONE HCL 5 MG PO TABS: 5.0000 mg | ORAL_TABLET | ORAL | 0 refills | Status: DC | PRN

## 2022-03-18 NOTE — Progress Notes (Signed)
PT Cancellation Note  Patient Details Name: Donald Gray MRN: [id number] DOB: Dec 28, 1940   Cancelled Treatment:    Reason Eval/Treat Not Completed: PT screened, no needs identified, will sign off Patient seen by OT and functioning at baseline with chronic B foot drop. Has gone through PT before for same reason. All education completed by OT. No skilled PT needs identified acutely. PT will sign off.    Anelise Staron A. Gilford Rile PT, DPT Acute Rehabilitation Services Office [phone]   Linna Hoff 03/18/2022, 8:43 AM

## 2022-03-18 NOTE — Evaluation (Signed)
Occupational Therapy Evaluation Patient Details Name: Donald Gray MRN: [id number] DOB: May 09, 1940 Today's Date: 03/18/2022   History of Present Illness 81 yo s/p PLIF.  PMH includes: DM, HTN, Hyperlipidemia   Clinical Impression   Patient admitted for the above procedure.  PTA he did have bilateral foot drop, but has needed DME at home, and appropriate level of assist if needed.  He is very close to his baseline, is looking to discharge home, and is very close to his baseline.  All precautions reviewed, good understanding, and all question answered.  No further needs in the acute setting.        Recommendations for follow up therapy are one component of a multi-disciplinary discharge planning process, led by the attending physician.  Recommendations may be updated based on patient status, additional functional criteria and insurance authorization.   Follow Up Recommendations  No OT follow up     Assistance Recommended at Discharge Intermittent Supervision/Assistance  Patient can return home with the following Assist for transportation;Help with stairs or ramp for entrance    Functional Status Assessment  Patient has had a recent decline in their functional status and demonstrates the ability to make significant improvements in function in a reasonable and predictable amount of time.  Equipment Recommendations  None recommended by OT    Recommendations for Other Services       Precautions / Restrictions Precautions Precautions: Back Precaution Booklet Issued: Yes (comment) Restrictions Weight Bearing Restrictions: No      Mobility Bed Mobility Overal bed mobility: Modified Independent                  Transfers Overall transfer level: Modified independent Equipment used: Rolling walker (2 wheels)                      Balance Overall balance assessment: Mild deficits observed, not formally tested                                          ADL either performed or assessed with clinical judgement   ADL Overall ADL's : At baseline                                             Vision Baseline Vision/History: 1 Wears glasses Patient Visual Report: No change from baseline       Perception     Praxis      Pertinent Vitals/Pain Pain Assessment Pain Assessment: Faces Faces Pain Scale: Hurts a little bit Pain Location: incision Pain Descriptors / Indicators: Tender Pain Intervention(s): Monitored during session     Hand Dominance Right   Extremity/Trunk Assessment Upper Extremity Assessment Upper Extremity Assessment: Overall WFL for tasks assessed   Lower Extremity Assessment Lower Extremity Assessment: RLE deficits/detail;LLE deficits/detail RLE Deficits / Details: foot drop RLE Sensation: decreased light touch LLE Deficits / Details: foot drop and weaker knee LLE Sensation: decreased light touch   Cervical / Trunk Assessment Cervical / Trunk Assessment: Back Surgery   Communication Communication Communication: No difficulties   Cognition Arousal/Alertness: Awake/alert Behavior During Therapy: WFL for tasks assessed/performed Overall Cognitive Status: Within Functional Limits for tasks assessed  General Comments   VSS on RA    Exercises     Shoulder Instructions      Home Living Family/patient expects to be discharged to:: Private residence Living Arrangements: Spouse/significant other Available Help at Discharge: Family;Available 24 hours/day Type of Home: House Home Access: Stairs to enter [employer] of Steps: 3 Entrance Stairs-Rails: Right Home Layout: Two level   Alternate Level Stairs-Rails: Left Bathroom Shower/Tub: Occupational psychologist: Handicapped height Bathroom Accessibility: Yes How Accessible: Accessible via walker Home Equipment: [place] (2 wheels);Cane - single  point;Adaptive equipment;Grab bars - tub/shower;Grab bars - toilet;Toilet riser Adaptive Equipment: Reacher        Prior Functioning/Environment Prior Level of Function : Independent/Modified Independent                        OT Problem List: Decreased strength      OT Treatment/Interventions:      OT Goals(Current goals can be found in the care plan section) Acute Rehab OT Goals Patient Stated Goal: Return home OT Goal Formulation: With patient Time For Goal Achievement: 03/22/22 Potential to Achieve Goals: Good  OT Frequency:      Co-evaluation              AM-PAC OT "6 Clicks" Daily Activity     Outcome Measure Help from another person eating meals?: None Help from another person taking care of personal grooming?: None Help from another person toileting, which includes using toliet, bedpan, or urinal?: A Little Help from another person bathing (including washing, rinsing, drying)?: A Little Help from another person to put on and taking off regular upper body clothing?: None Help from another person to put on and taking off regular lower body clothing?: A Little 6 Click Score: 21   End of Session Equipment Utilized During Treatment: Rolling walker (2 wheels) Nurse Communication: Mobility status  Activity Tolerance: Patient tolerated treatment well Patient left: in bed;with call bell/phone within reach  OT Visit Diagnosis: Unsteadiness on feet (R26.81)                Time: 2229-7989 OT Time Calculation (min): 21 min Charges:  OT General Charges $OT Visit: 1 Visit OT Evaluation $OT Eval Moderate Complexity: 1 Mod  03/18/2022  RP, OTR/L  Acute Rehabilitation Services  Office:  [phone]   Metta Clines 03/18/2022, 9:20 AM

## 2022-03-18 NOTE — Plan of Care (Signed)
Pt doing well. Pt and wife given D/C instructions with verbal understanding. Rx's were sent to the pharmacy by MD. Pt's incision is clean and dry with no sign of infection. Pt's IV was removed prior to D/C. Pt D/C'd home via wheelchair per MD order. Pt is stable @ D/C and has no other needs at this time. Anitta Tenny, RN  

## 2022-03-18 NOTE — Discharge Summary (Signed)
Physician Discharge Summary  Patient ID: Donald Gray MRN: [id number] DOB/AGE: Nov 04, 1940 81 y.o.  Admit date: 03/17/2022 Discharge date: 03/18/2022  Admission Diagnoses: lumbar stenosis    Discharge Diagnoses: same   Discharged Condition: good  Hospital Course: The patient was admitted on 03/17/2022 and taken to the operating room where the patient underwent DLL L2-5. The patient tolerated the procedure well and was taken to the recovery room and then to the floor in stable condition. The hospital course was routine. There were no complications. The wound remained clean dry and intact. Pt had appropriate back soreness. No complaints of leg pain or new N/T/W. The patient remained afebrile with stable vital signs, and tolerated a regular diet. The patient continued to increase activities, and pain was well controlled with oral pain medications.   Consults: None  Significant Diagnostic Studies:  Results for orders placed or performed during the hospital encounter of 03/17/22  Glucose, capillary  Result Value Ref Range   Glucose-Capillary 139 (H) 70 - 99 mg/dL   Comment 1 Notify RN   Glucose, capillary  Result Value Ref Range   Glucose-Capillary 102 (H) 70 - 99 mg/dL  Glucose, capillary  Result Value Ref Range   Glucose-Capillary 110 (H) 70 - 99 mg/dL    DG Lumbar Spine 1 View  Result Date: 03/17/2022 CLINICAL DATA:  Surgical localization for laminectomy. EXAM: LUMBAR SPINE - 1 VIEW COMPARISON:  MRI January 14, 2022. FINDINGS: Single intraoperative cross-table lateral projection was obtained of the lumbar spine. Surgical probe is at approximately the L3-4 level. IMPRESSION: Surgical localization as described above. Electronically Signed   By: Marijo Conception M.D.   On: 03/17/2022 16:14   LONG TERM MONITOR (3-14 DAYS)  Result Date: 03/12/2022   NSR with average HR 70 bpm   Four nonsustained SVT episodes with longest 24 seconds, and fastest 174 bpm.   PAC and PVC burden <  1%   No atrial fibrillation Patch Wear Time:  13 days and 16 hours (2023-10-16T19:53:39-0400 to 2023-10-30T11:57:59-0400) Patient had a min HR of 49 bpm, max HR of 174 bpm, and avg HR of 70 bpm. Predominant underlying rhythm was Sinus Rhythm. 4 Supraventricular Tachycardia runs occurred, the run with the fastest interval lasting 4 beats with a max rate of 174 bpm, the longest lasting 24.3 secs with an avg rate of 105 bpm. Isolated SVEs were rare (<1.0%), SVE Couplets were rare (<1.0%), and SVE Triplets were rare (<1.0%). Isolated VEs were rare (<1.0%), VE Couplets were rare (<1.0%), and no VE Triplets were present. Ventricular Bigeminy was present.    Antibiotics:  Anti-infectives (From admission, onward)    Start     Dose/Rate Route Frequency Ordered Stop   03/17/22 2300  vancomycin (VANCOREADY) IVPB 1250 mg/250 mL        1,250 mg 166.7 mL/hr over 90 Minutes Intravenous  Once 03/17/22 1748 03/18/22 0058   03/17/22 1845  vancomycin (VANCOREADY) IVPB 1250 mg/250 mL  Status:  Discontinued        1,250 mg 166.7 mL/hr over 90 Minutes Intravenous  Once 03/17/22 1747 03/17/22 1748   03/17/22 1030  vancomycin (VANCOCIN) IVPB 1000 mg/200 mL premix        1,000 mg 200 mL/hr over 60 Minutes Intravenous On call to O.R. 03/17/22 1026 03/17/22 1155       Discharge Exam: Blood pressure (!) 121/58, pulse 70, temperature 97.9 F (36.6 C), temperature source Oral, resp. rate 18, height '5\' 11"'$  (1.803 m), weight 95.3 kg, SpO2  97 %. Neurologic: Grossly normal, except chronic foot drop B Dressing dry  Discharge Medications:   Allergies as of 03/18/2022       Reactions   Penicillins Swelling   Ambien [zolpidem Tartrate] Other (See Comments)   Causes hallucinations.    Codeine    hallucinations    Lisinopril Cough        Medication List     STOP taking these medications    rivaroxaban 20 MG Tabs tablet Commonly known as: Xarelto       TAKE these medications    ascorbic acid 500 MG  tablet Commonly known as: VITAMIN C Take 500 mg by mouth daily.   atorvastatin 10 MG tablet Commonly known as: LIPITOR Take 1 tablet (10 mg total) by mouth daily with breakfast.   cetirizine 10 MG tablet Commonly known as: ZYRTEC Take 10 mg by mouth daily. \   cholecalciferol 1000 units tablet Commonly known as: VITAMIN D Take 1,000 Units by mouth daily.   clindamycin 150 MG capsule Commonly known as: CLEOCIN Take 600 mg by mouth See admin instructions. Take 600 mg by mouth one hour prior to dental treatment   fluticasone 50 MCG/ACT nasal spray Commonly known as: FLONASE Place 1 spray into both nostrils daily as needed for allergies.   magnesium gluconate 500 MG tablet Commonly known as: MAGONATE Take 500 mg by mouth 2 (two) times daily.   metoprolol tartrate 25 MG tablet Commonly known as: LOPRESSOR Take 1 tablet (25 mg total) by mouth 2 (two) times daily.   multivitamin with minerals Tabs tablet Take 1 tablet by mouth daily.   nitroGLYCERIN 0.4 MG SL tablet Commonly known as: NITROSTAT Place 1 tablet (0.4 mg total) under the tongue every 5 (five) minutes as needed for chest pain.   oxyCODONE 5 MG immediate release tablet Commonly known as: Oxy IR/ROXICODONE Take 1 tablet (5 mg total) by mouth every 4 (four) hours as needed for moderate pain ((score 4 to 6)).   Polyethyl Glycol-Propyl Glycol 0.4-0.3 % Soln Place 1 drop into both eyes daily as needed (dry eyes).   telmisartan 80 MG tablet Commonly known as: MICARDIS Take 1 tablet (80 mg total) by mouth every evening.        Disposition: home   Final Dx: DLL L2-3 L3-4 L4-5  Discharge Instructions      Remove dressing in 72 hours   Complete by: As directed    Call MD for:  difficulty breathing, headache or visual disturbances   Complete by: As directed    Call MD for:  persistant nausea and vomiting   Complete by: As directed    Call MD for:  redness, tenderness, or signs of infection (pain, swelling,  redness, odor or green/yellow discharge around incision site)   Complete by: As directed    Call MD for:  severe uncontrolled pain   Complete by: As directed    Call MD for:  temperature >100.4   Complete by: As directed    Diet - low sodium heart healthy   Complete by: As directed    Increase activity slowly   Complete by: As directed           Signed: Eustace Moore 03/18/2022, 7:57 AM

## 2022-05-04 DIAGNOSIS — Z6829 Body mass index (BMI) 29.0-29.9, adult: Secondary | ICD-10-CM | POA: Diagnosis not present

## 2022-05-04 DIAGNOSIS — M48062 Spinal stenosis, lumbar region with neurogenic claudication: Secondary | ICD-10-CM | POA: Diagnosis not present

## 2022-05-04 DIAGNOSIS — M25551 Pain in right hip: Secondary | ICD-10-CM | POA: Diagnosis not present

## 2022-05-07 ENCOUNTER — Other Ambulatory Visit (HOSPITAL_COMMUNITY): Payer: Self-pay | Admitting: Neurological Surgery

## 2022-05-07 DIAGNOSIS — M25551 Pain in right hip: Secondary | ICD-10-CM

## 2022-05-13 ENCOUNTER — Encounter (HOSPITAL_COMMUNITY)
Admission: RE | Admit: 2022-05-13 | Discharge: 2022-05-13 | Disposition: A | Discharge status: Home / Self Care | Payer: Medicare HMO | Source: Ambulatory Visit | Attending: Neurological Surgery | Admitting: Neurological Surgery

## 2022-05-13 DIAGNOSIS — M25551 Pain in right hip: Secondary | ICD-10-CM | POA: Diagnosis not present

## 2022-05-13 MED ORDER — TECHNETIUM TC 99M MEDRONATE IV KIT
20.0000 | PACK | Freq: Once | INTRAVENOUS | Status: AC | PRN
  Administered 2022-05-13: 20 via INTRAVENOUS

## 2022-05-13 NOTE — Progress Notes (Signed)
Office Visit    Patient Name: Donald Gray Date of Encounter: 05/14/2022  PCP:  Lavone Orn, MD   [place]  Cardiologist:  Larae Grooms, MD  Advanced Practice Provider:  No care team member to display Electrophysiologist:  None    Chief Complaint    Donald Gray is a 82 y.o. male medical history of CAD with remote stent in RCA (1995), obesity, diet-controlled DM, HLD, HTN, renal carcinoma treated with targeted chemotherapy, and PAF with CHA2DS2-VASc score of 4 on Xarelto presents today for annual follow-up appointment.  He was last seen in the office last year 10/2020 with Dr. Tamala Julian.  He was doing well at that time.  He did have exertional fatigue and dyspnea.  He admitted to being deconditioned during the pandemic.  He had not had any chest pain.  He had not been able to exercise on a regular basis.  He was seen by me 10/2021 and he stated he is having some shortness of breath.  He missed his 41-monthfollow-up appointment.  He has not needed any nitroglycerin in a long time.  He states that his rhythm has been regular and he wants to know when he can stop the Xarelto.  He says his activity level is limited to leg and back pain.  His shortness of breath could be due to some exercise intolerance.  His PCP looks after his cholesterol levels and the most recent numbers that I have are total cholesterol 156, HDL 54, LDL 83, and triglycerides 104.  LDL goal would be less than 70 due to history of CAD.  He would like to try going off his statin therapy because she believes that it is making his leg pain worse.  We will arrange for him to see one of our Pharm.D.'s to discuss injectable options and to see if insurance would cover.   He was last seen 02/11/2022 by Dr. STamala Julianfor clearance for lumbar disc surgery.  He wanted to know if he could permanently discontinue Xarelto.  A 2-week monitor was ordered to determine if the patient was having background  atrial fibrillation.  If not then the decision to discontinue anticoagulation therapy may be made.  Today, he states he continues to have pain in his right leg. Dr. JRonnald Rampdid surgery in his back but he continues to have leg pain. No fluttering in his chest. We reviewed his heart monitor and discussed xarelto. He does endorse some SOB but he states he is out of shape due to not being able to do his normal activities due to the leg pain he is experiencing. Otherwise, feeling well from a CV standpoint.  Reports no chest pain, pressure, or tightness. No edema, orthopnea, PND. Reports no palpitations.   Past Medical History    Past Medical History:  Diagnosis Date   Anemia    Anticoagulated- to go to Xarelto for a fib 10/07/2013   Arthritis    "fingers, toes" (10/05/2013)   Coronary artery disease    a. s/p MI with stent in 2000   Diverticulosis    Dysrhythmia    GERD (gastroesophageal reflux disease)    History of blood transfusion    "w/nephrectomy; w/shoulder replacement; w/knee replacement"   Hyperlipidemia    Hypertension    Left knee DJD    Myocardial infarction ([place] 05/04/1995   Neuropathy    feet bilat    NSTEMI (non-ST elevated myocardial infarction), type 2 due to demand ischemia 10/07/2013  PAF (paroxysmal atrial fibrillation) ([place])    Pneumonia ~ 2007   again 3 months ago    Primary localized osteoarthritis of right knee    Renal cell carcinoma ([place])    a. s/p right nephrectomy 08/14/09   Shortness of breath    a. related to chemo drugs   Tendonitis    currently on prednisone and wearing brace (left wrist)   Type II diabetes mellitus ([place])    "treating w/diet and weight loss"   Past Surgical History:  Procedure Laterality Date   CARDIAC CATHETERIZATION  05/2009   CARDIOVERSION N/A 10/08/2013   Procedure: CARDIOVERSION;  Surgeon: Dorothy Spark, MD;  Location: [facility];  Service: Cardiovascular;  Laterality: N/A;   CHEILECTOMY Right 04/13/2013   Procedure:  Right Great Toe Cheilectomy, Debride Tendon and Abscess, Apply Stimulan Beads;  Surgeon: Newt Minion, MD;  Location: [facility];  Service: Orthopedics;  Laterality: Right;  Right Great Toe Cheilectomy, Debride Tendon and Abscess, Apply Stimulan Beads   colonscopy      CORONARY ANGIOPLASTY WITH STENT PLACEMENT  1997   at [facility] to [place]  09/22/2011   Procedure: HERNIA REPAIR INGUINAL ADULT BILATERAL;  Surgeon: Imogene Burn. Georgette Dover, MD;  Location: [facility];  Service: General;  Laterality: Bilateral;   INGUINAL HERNIA REPAIR Bilateral 09/22/11   JOINT REPLACEMENT     KNEE ARTHROSCOPY Right ~ 2011   KNEE ARTHROSCOPY Left 11/13/2012   Procedure: ARTHROSCOPY KNEE;  Surgeon: Lorn Junes, MD;  Location: [facility];  Service: Orthopedics;  Laterality: Left;   LUMBAR LAMINECTOMY/DECOMPRESSION MICRODISCECTOMY N/A 03/17/2022   Procedure: Laminectomy and Foraminotomy - Lumbar two-three, Lumbar three-four, Lumbar four-five;  Surgeon: Eustace Moore, MD;  Location: [facility];  Service: Neurosurgery;  Laterality: N/A;   LUNG BIOPSY  2011   MASS EXCISION Right 04/12/2014   Procedure: MASS EXCISION DORSUM OF RIGHT FOOT  ;  Surgeon: Tobi Bastos, MD;  Location: [facility];  Service: Orthopedics;  Laterality: Right;   NEPHRECTOMY Right 08/14/2009   ORCHIECTOMY  09/22/2011   Procedure: ORCHIECTOMY;  Surgeon: Franchot Gallo, MD;  Location: [facility];  Service: Urology;  Laterality: Left;  Left Testicular Exploration, Inguinal Approach,  Testicular    PILONIDAL CYST DRAINAGE     TEE WITHOUT CARDIOVERSION N/A 10/08/2013   Procedure: TRANSESOPHAGEAL ECHOCARDIOGRAM (TEE);  Surgeon: Dorothy Spark, MD;  Location: [facility];  Service: Cardiovascular;  Laterality: N/A;   [place] Left 11/13/2012   Procedure: TOTAL KNEE ARTHROPLASTY;  Surgeon: Lorn Junes, MD;  Location: [facility];  Service: Orthopedics;  Laterality: Left;   TOTAL KNEE ARTHROPLASTY Right  07/22/2014   Procedure: RIGHT TOTAL KNEE ARTHROPLASTY;  Surgeon: Elsie Saas, MD;  Location: [facility];  Service: Orthopedics;  Laterality: Right;   TOTAL SHOULDER REPLACEMENT Right 1997    Allergies  Allergies  Allergen Reactions   Penicillins Swelling   Ambien [Zolpidem Tartrate] Other (See Comments)    Causes hallucinations.    Codeine     hallucinations    Lisinopril Cough     EKGs/Labs/Other Studies Reviewed:   The following studies were reviewed today:  Echocardiogram 12/15/2021  IMPRESSIONS     1. Left ventricular ejection fraction, by estimation, is 55%. The left  ventricle has normal function. The left ventricle has no regional wall  motion abnormalities. The left ventricular internal cavity size was mildly  dilated. Left ventricular diastolic  parameters were normal.  2. Right ventricular systolic function is normal. The right ventricular  size is normal. There is normal pulmonary artery systolic pressure.   3. The mitral valve is abnormal. Mild mitral valve regurgitation. No  evidence of mitral stenosis.   4. The aortic valve is tricuspid. Aortic valve regurgitation is not  visualized. No aortic stenosis is present.   5. The inferior vena cava is normal in size with greater than 50%  respiratory variability, suggesting right atrial pressure of 3 mmHg.   FINDINGS   Left Ventricle: Left ventricular ejection fraction, by estimation, is  55%. The left ventricle has normal function. The left ventricle has no  regional wall motion abnormalities. The left ventricular internal cavity  size was mildly dilated. There is no  left ventricular hypertrophy. Left ventricular diastolic parameters were  normal.   Right Ventricle: The right ventricular size is normal. No increase in  right ventricular wall thickness. Right ventricular systolic function is  normal. There is normal pulmonary artery systolic pressure. The tricuspid  regurgitant velocity is 1.90 m/s, and   with  an assumed right atrial pressure of 3 mmHg, the estimated right  ventricular systolic pressure is 49.6 mmHg.   Left Atrium: Left atrial size was normal in size.   Right Atrium: Right atrial size was normal in size.   Pericardium: There is no evidence of pericardial effusion.   Mitral Valve: The mitral valve is abnormal. There is mild thickening of  the mitral valve leaflet(s). Mild mitral valve regurgitation. No evidence  of mitral valve stenosis.   Tricuspid Valve: The tricuspid valve is normal in structure. Tricuspid  valve regurgitation is not demonstrated. No evidence of tricuspid  stenosis.   Aortic Valve: The aortic valve is tricuspid. Aortic valve regurgitation is  not visualized. Aortic regurgitation PHT measures 489 msec. No aortic  stenosis is present.   Pulmonic Valve: The pulmonic valve was normal in structure. Pulmonic valve  regurgitation is not visualized. No evidence of pulmonic stenosis.   Aorta: The aortic root is normal in size and structure.   Venous: The inferior vena cava is normal in size with greater than 50%  respiratory variability, suggesting right atrial pressure of 3 mmHg.   IAS/Shunts: No atrial level shunt detected by color flow Doppler.   Lexiscan Myoview 07/29/2015  Addendum by Skeet Latch, MD on Tue Jul 29, 2015  6:41 PM Nuclear stress EF: 54%. There was no ST segment deviation noted during stress. Defect 1: There is a medium defect of severe severity present in the basal inferior, mid inferior, apical inferior and apex location. Findings consistent with prior myocardial infarction with a small amount of peri-infarct ischemia. This is a low risk study. The left ventricular ejection fraction is mildly decreased (45-54%).   Finalized by Skeet Latch, MD on Tue Jul 29, 2015  6:40 PM Nuclear stress EF: 54%. There was no ST segment deviation noted during stress. Defect 1: There is a medium defect of severe severity present in the  basal inferior, mid inferior, apical inferior and apex location. Findings consistent with prior myocardial infarction with a small amount of peri-infarct ischemia. This is an intermediate risk study. The left ventricular ejection fraction is mildly decreased (45-54%).  EKG:  EKG is  ordered today.  The ekg ordered today demonstrates NSR rate 72 bpm  Recent Labs: 03/10/2022: BUN 19; Creatinine, Ser 1.07; Hemoglobin 14.4; Platelets 183; Potassium 4.5; Sodium 136  Recent Lipid Panel    Component Value Date/Time   CHOL 137 07/20/2017  1015   TRIG 190 (H) 07/20/2017 1015   HDL 35 (L) 07/20/2017 1015   CHOLHDL 3.9 07/20/2017 1015   CHOLHDL 3.1 10/06/2013 0442   VLDL 15 10/06/2013 0442   LDLCALC 64 07/20/2017 1015    Risk Assessment/Calculations:   CHA2DS2-VASc Score = 5   This indicates a 7.2% annual risk of stroke. The patient's score is based upon: CHF History: 1 HTN History: 1 Diabetes History: 0 Stroke History: 0 Vascular Disease History: 1 Age Score: 2 Gender Score: 0     Home Medications   Current Meds  Medication Sig   aspirin EC 81 MG tablet Take 1 tablet (81 mg total) by mouth daily. Swallow whole.   atorvastatin (LIPITOR) 10 MG tablet Take 1 tablet (10 mg total) by mouth daily with breakfast.   cetirizine (ZYRTEC) 10 MG tablet Take 10 mg by mouth daily. \   cholecalciferol (VITAMIN D) 1000 UNITS tablet Take 1,000 Units by mouth daily.   clindamycin (CLEOCIN) 150 MG capsule Take 600 mg by mouth See admin instructions. Take 600 mg by mouth one hour prior to dental treatment   fluticasone (FLONASE) 50 MCG/ACT nasal spray Place 1 spray into both nostrils daily as needed for allergies.   magnesium gluconate (MAGONATE) 500 MG tablet Take 500 mg by mouth 2 (two) times daily.    metoprolol tartrate (LOPRESSOR) 25 MG tablet Take 1 tablet (25 mg total) by mouth 2 (two) times daily.   Multiple Vitamin (MULTIVITAMIN WITH MINERALS) TABS Take 1 tablet by mouth daily.    nitroGLYCERIN (NITROSTAT) 0.4 MG SL tablet Place 1 tablet (0.4 mg total) under the tongue every 5 (five) minutes as needed for chest pain.   oxyCODONE (OXY IR/ROXICODONE) 5 MG immediate release tablet Take 1 tablet (5 mg total) by mouth every 4 (four) hours as needed for moderate pain ((score 4 to 6)).   Polyethyl Glycol-Propyl Glycol 0.4-0.3 % SOLN Place 1 drop into both eyes daily as needed (dry eyes).   telmisartan (MICARDIS) 80 MG tablet Take 1 tablet (80 mg total) by mouth every evening.   vitamin C (ASCORBIC ACID) 500 MG tablet Take 500 mg by mouth daily.    [DISCONTINUED] XARELTO 20 MG TABS tablet Take 20 mg by mouth daily.     Review of Systems      All other systems reviewed and are otherwise negative except as noted above.  Physical Exam    VS:  BP 128/74   Pulse 63   Ht '5\' 11"'$  (1.803 m)   Wt 205 lb (93 kg)   SpO2 98%   BMI 28.59 kg/m  , BMI Body mass index is 28.59 kg/m.  Wt Readings from Last 3 Encounters:  05/14/22 205 lb (93 kg)  03/17/22 210 lb (95.3 kg)  03/10/22 210 lb (95.3 kg)     GEN: Well nourished, well developed, in no acute distress. HEENT: normal. Neck: Supple, no JVD, carotid bruits, or masses. Cardiac: RRR, no murmurs, rubs, or gallops. No clubbing, cyanosis, edema.  Radials/PT 2+ and equal bilaterally.  Respiratory:  Respirations regular and unlabored, clear to auscultation bilaterally. GI: Soft, nontender, nondistended. MS: No deformity or atrophy. Skin: Warm and dry, no rash. Neuro:  Strength and sensation are intact. Psych: Normal affect.  Assessment & Plan    Coronary artery disease status post PCI to RCA in 1995 -No chest pain but has been SOB every now and then -Continue GDMT: Lipitor 10 mg, Lopressor 25 mg twice a day, nitro sublingual tabs as  needed, Xarelto 20 mg daily, telmisartan 80 mg daily  Paroxysmal atrial fibrillation -Recent monitor 03/12/2022 did not show any atrial fibrillation.   Xarelto was discontinued today and the  patient knows his slightly increased risk of stroke -added ASA 81 mg daily  Chronic diastolic heart failure -Euvolemic on exam today -Continue current medication regimen  Essential hypertension -Well-controlled today in the clinic -Continue to monitor at home -Continue current medication regimen  Hyperlipidemia -Lipids are managed by primary -He would like to try a period of time off of his Lipitor and potentially hear about some other options for cholesterol    Disposition: Follow up 3 months with Larae Grooms, MD or APP.  Signed, Elgie Collard, PA-C 05/14/2022, 5:19 PM [facility]

## 2022-05-14 ENCOUNTER — Encounter: Payer: Self-pay | Admitting: Physician Assistant

## 2022-05-14 ENCOUNTER — Ambulatory Visit: Payer: Medicare HMO | Attending: Physician Assistant | Admitting: Physician Assistant

## 2022-05-14 VITALS — BP 128/74 | HR 63 | Ht 71.0 in | Wt 205.0 lb

## 2022-05-14 DIAGNOSIS — I5032 Chronic diastolic (congestive) heart failure: Secondary | ICD-10-CM | POA: Diagnosis not present

## 2022-05-14 DIAGNOSIS — E7849 Other hyperlipidemia: Secondary | ICD-10-CM | POA: Diagnosis not present

## 2022-05-14 DIAGNOSIS — C649 Malignant neoplasm of unspecified kidney, except renal pelvis: Secondary | ICD-10-CM | POA: Diagnosis not present

## 2022-05-14 DIAGNOSIS — I480 Paroxysmal atrial fibrillation: Secondary | ICD-10-CM

## 2022-05-14 DIAGNOSIS — I25119 Atherosclerotic heart disease of native coronary artery with unspecified angina pectoris: Secondary | ICD-10-CM | POA: Diagnosis not present

## 2022-05-14 DIAGNOSIS — I10 Essential (primary) hypertension: Secondary | ICD-10-CM

## 2022-05-14 DIAGNOSIS — Z7901 Long term (current) use of anticoagulants: Secondary | ICD-10-CM

## 2022-05-14 MED ORDER — ASPIRIN 81 MG PO TBEC: 81.0000 mg | DELAYED_RELEASE_TABLET | Freq: Every day | ORAL | 3 refills | Status: DC

## 2022-05-14 NOTE — Patient Instructions (Signed)
Medication Instructions:  1.Stop Xarelto 2.Start over the counter Aspirin 81 mg daily *If you need a refill on your cardiac medications before your next appointment, please call your pharmacy*   Lab Work: None If you have labs (blood work) drawn today and your tests are completely normal, you will receive your results only by: [place] (if you have MyChart) OR A paper copy in the mail If you have any lab test that is abnormal or we need to change your treatment, we will call you to review the results.   Follow-Up: At [facility], you and your health needs are our priority.  As part of our continuing mission to provide you with exceptional heart care, we have created designated Provider Care Teams.  These Care Teams include your primary Cardiologist (physician) and Advanced Practice Providers (APPs -  Physician Assistants and Nurse Practitioners) who all work together to provide you with the care you need, when you need it.   Your next appointment:   07/29/2022 at 3:00 PM  Provider:   Dr Irish Lack

## 2022-05-18 DIAGNOSIS — E119 Type 2 diabetes mellitus without complications: Secondary | ICD-10-CM | POA: Diagnosis not present

## 2022-05-18 DIAGNOSIS — H25013 Cortical age-related cataract, bilateral: Secondary | ICD-10-CM | POA: Diagnosis not present

## 2022-05-18 DIAGNOSIS — H5203 Hypermetropia, bilateral: Secondary | ICD-10-CM | POA: Diagnosis not present

## 2022-05-21 DIAGNOSIS — M1611 Unilateral primary osteoarthritis, right hip: Secondary | ICD-10-CM | POA: Diagnosis not present

## 2022-05-24 ENCOUNTER — Telehealth: Payer: Self-pay | Admitting: *Deleted

## 2022-05-24 NOTE — Telephone Encounter (Signed)
   Pre-operative Risk Assessment    Patient Name: Donald Gray  DOB: 1941/04/13 MRN: [id number]      Request for Surgical Clearance    Procedure:   RIGHT TOTAL HIP REPLACEMENT  Date of Surgery:  Clearance TBD                                 Surgeon:  Marchia Bond, MD Surgeon's Group or Practice Name:  [facility] Phone number:  [phone] Fax number:  [phone]   Type of Clearance Requested:   - Medical  - Pharmacy:  Hold Aspirin NOT INDICATED HOW LONG   Type of Anesthesia:  Spinal   Additional requests/questions:    Astrid Divine   05/24/2022, 8:27 AM

## 2022-05-24 NOTE — Telephone Encounter (Signed)
   Name: Donald Gray  DOB: 1941-04-06  MRN: [id number]   Primary Cardiologist: Larae Grooms, MD  Chart reviewed as part of pre-operative protocol coverage. Patient was contacted 05/24/2022 in reference to pre-operative risk assessment for pending surgery as outlined below.  Donald Gray was last seen on 05/14/2022 by Nicholes Rough, PA.  Since that day, Donald Gray has done well from a cardiac standpoint.  He denies any new symptoms or concerns.  He is able to complete greater than 4 METS without difficulty.  Therefore, based on ACC/AHA guidelines, the patient would be at acceptable risk for the planned procedure without further cardiovascular testing.   Regarding ASA therapy, we recommend continuation of ASA throughout the perioperative period.  However, if the surgeon feels that cessation of ASA is required in the perioperative period, it may be stopped 5-7 days prior to surgery with a plan to resume it as soon as felt to be feasible from a surgical standpoint in the post-operative period.  The patient was advised that if he develops new symptoms prior to surgery to contact our office to arrange for a follow-up visit, and he verbalized understanding.  I will route this recommendation to the requesting party via Epic fax function and remove from pre-op pool. Please call with questions.  Lenna Sciara, NP 05/24/2022, 12:16 PM

## 2022-06-10 DIAGNOSIS — L298 Other pruritus: Secondary | ICD-10-CM | POA: Diagnosis not present

## 2022-06-10 DIAGNOSIS — D2361 Other benign neoplasm of skin of right upper limb, including shoulder: Secondary | ICD-10-CM | POA: Diagnosis not present

## 2022-06-10 DIAGNOSIS — D1801 Hemangioma of skin and subcutaneous tissue: Secondary | ICD-10-CM | POA: Diagnosis not present

## 2022-06-10 DIAGNOSIS — L821 Other seborrheic keratosis: Secondary | ICD-10-CM | POA: Diagnosis not present

## 2022-06-10 DIAGNOSIS — D2362 Other benign neoplasm of skin of left upper limb, including shoulder: Secondary | ICD-10-CM | POA: Diagnosis not present

## 2022-06-10 DIAGNOSIS — D2262 Melanocytic nevi of left upper limb, including shoulder: Secondary | ICD-10-CM | POA: Diagnosis not present

## 2022-06-10 DIAGNOSIS — D225 Melanocytic nevi of trunk: Secondary | ICD-10-CM | POA: Diagnosis not present

## 2022-06-10 DIAGNOSIS — L720 Epidermal cyst: Secondary | ICD-10-CM | POA: Diagnosis not present

## 2022-06-10 DIAGNOSIS — D2239 Melanocytic nevi of other parts of face: Secondary | ICD-10-CM | POA: Diagnosis not present

## 2022-06-15 DIAGNOSIS — C641 Malignant neoplasm of right kidney, except renal pelvis: Secondary | ICD-10-CM | POA: Diagnosis not present

## 2022-06-25 DIAGNOSIS — I480 Paroxysmal atrial fibrillation: Secondary | ICD-10-CM | POA: Diagnosis not present

## 2022-06-25 DIAGNOSIS — I2510 Atherosclerotic heart disease of native coronary artery without angina pectoris: Secondary | ICD-10-CM | POA: Diagnosis not present

## 2022-06-25 DIAGNOSIS — I25119 Atherosclerotic heart disease of native coronary artery with unspecified angina pectoris: Secondary | ICD-10-CM | POA: Diagnosis not present

## 2022-06-25 DIAGNOSIS — N1831 Chronic kidney disease, stage 3a: Secondary | ICD-10-CM | POA: Diagnosis not present

## 2022-06-25 DIAGNOSIS — G629 Polyneuropathy, unspecified: Secondary | ICD-10-CM | POA: Diagnosis not present

## 2022-06-25 DIAGNOSIS — R809 Proteinuria, unspecified: Secondary | ICD-10-CM | POA: Diagnosis not present

## 2022-06-25 DIAGNOSIS — Z1331 Encounter for screening for depression: Secondary | ICD-10-CM | POA: Diagnosis not present

## 2022-06-25 DIAGNOSIS — Z9181 History of falling: Secondary | ICD-10-CM | POA: Diagnosis not present

## 2022-06-25 DIAGNOSIS — Z0000 Encounter for general adult medical examination without abnormal findings: Secondary | ICD-10-CM | POA: Diagnosis not present

## 2022-06-25 DIAGNOSIS — Z79899 Other long term (current) drug therapy: Secondary | ICD-10-CM | POA: Diagnosis not present

## 2022-06-25 DIAGNOSIS — Z85528 Personal history of other malignant neoplasm of kidney: Secondary | ICD-10-CM | POA: Diagnosis not present

## 2022-06-25 DIAGNOSIS — I110 Hypertensive heart disease with heart failure: Secondary | ICD-10-CM | POA: Diagnosis not present

## 2022-06-25 DIAGNOSIS — E1122 Type 2 diabetes mellitus with diabetic chronic kidney disease: Secondary | ICD-10-CM | POA: Diagnosis not present

## 2022-06-25 DIAGNOSIS — G609 Hereditary and idiopathic neuropathy, unspecified: Secondary | ICD-10-CM | POA: Diagnosis not present

## 2022-06-25 DIAGNOSIS — E7800 Pure hypercholesterolemia, unspecified: Secondary | ICD-10-CM | POA: Diagnosis not present

## 2022-06-25 DIAGNOSIS — E1142 Type 2 diabetes mellitus with diabetic polyneuropathy: Secondary | ICD-10-CM | POA: Diagnosis not present

## 2022-07-02 DIAGNOSIS — Z0100 Encounter for examination of eyes and vision without abnormal findings: Secondary | ICD-10-CM | POA: Diagnosis not present

## 2022-07-29 ENCOUNTER — Encounter: Payer: Self-pay | Admitting: Interventional Cardiology

## 2022-07-29 ENCOUNTER — Ambulatory Visit: Payer: Medicare HMO | Attending: Interventional Cardiology | Admitting: Interventional Cardiology

## 2022-07-29 VITALS — BP 126/72 | HR 73 | Ht 71.0 in | Wt 201.2 lb

## 2022-07-29 DIAGNOSIS — Z7901 Long term (current) use of anticoagulants: Secondary | ICD-10-CM | POA: Diagnosis not present

## 2022-07-29 DIAGNOSIS — I25119 Atherosclerotic heart disease of native coronary artery with unspecified angina pectoris: Secondary | ICD-10-CM | POA: Diagnosis not present

## 2022-07-29 DIAGNOSIS — I10 Essential (primary) hypertension: Secondary | ICD-10-CM

## 2022-07-29 DIAGNOSIS — I480 Paroxysmal atrial fibrillation: Secondary | ICD-10-CM | POA: Diagnosis not present

## 2022-07-29 DIAGNOSIS — I5032 Chronic diastolic (congestive) heart failure: Secondary | ICD-10-CM

## 2022-07-29 DIAGNOSIS — E785 Hyperlipidemia, unspecified: Secondary | ICD-10-CM | POA: Diagnosis not present

## 2022-07-29 NOTE — Patient Instructions (Signed)
Medication Instructions:  Your physician recommends that you continue on your current medications as directed. Please refer to the Current Medication list given to you today.  *If you need a refill on your cardiac medications before your next appointment, please call your pharmacy*   Lab Work: none If you have labs (blood work) drawn today and your tests are completely normal, you will receive your results only by: MyChart Message (if you have MyChart) OR A paper copy in the mail If you have any lab test that is abnormal or we need to change your treatment, we will call you to review the results.   Testing/Procedures: none   Follow-Up: At [facility], you and your health needs are our priority.  As part of our continuing mission to provide you with exceptional heart care, we have created designated Provider Care Teams.  These Care Teams include your primary Cardiologist (physician) and Advanced Practice Providers (APPs -  Physician Assistants and Nurse Practitioners) who all work together to provide you with the care you need, when you need it.  We recommend signing up for the patient portal called "MyChart".  Sign up information is provided on this After Visit Summary.  MyChart is used to connect with patients for Virtual Visits (Telemedicine).  Patients are able to view lab/test results, encounter notes, upcoming appointments, etc.  Non-urgent messages can be sent to your provider as well.   To learn more about what you can do with MyChart, go to [email or web address].    Your next appointment:   12 month(s)  Provider:   Jayadeep Varanasi, MD     Other Instructions    

## 2022-07-29 NOTE — Progress Notes (Signed)
Cardiology Office Note   Date:  07/29/2022   ID:  Donald Gray 09/19/1940, MRN [id number]  PCP:  Lavone Orn, MD    No chief complaint on file.  CAD, A-fib  Wt Readings from Last 3 Encounters:  07/29/22 201 lb 3.2 oz (91.3 kg)  05/14/22 205 lb (93 kg)  03/17/22 210 lb (95.3 kg)       History of Present Illness: Donald Gray is a 82 y.o. male former patient of Dr. Tamala Julian with a hx of CAD with remote stent in RCA1995, obesity, diet controlled DM, HLD, HTN, renal carcinoma treated with targeted chemotherapy, and PAF wiith cardioversion 2015 and CHADS-VASC  score of 4.    He had neurosurgery with Dr. Ronnald Ramp.  He ideally wanted to come off of Xarelto to use anti-inflammatory therapy.  Per Dr. Tamala Julian: "He also wants to know if he can permanently discontinue Xarelto. He wants to be off of Xarelto because he is unable to use any anti-inflammatory therapy because of risk of bleeding. He is almost at a point where he is willing to accept a slightly higher risk of CVA to be able to get some relief from the back discomfort which would then increase his quality of life and physical activity. "  Stopped Xarelto after a negative monitor.  Taking ibuprofen helps the joint pain.    Past Medical History:  Diagnosis Date   Anemia    Anticoagulated- to go to Xarelto for a fib 10/07/2013   Arthritis    "fingers, toes" (10/05/2013)   Coronary artery disease    a. s/p MI with stent in 2000   Diverticulosis    Dysrhythmia    GERD (gastroesophageal reflux disease)    History of blood transfusion    "w/nephrectomy; w/shoulder replacement; w/knee replacement"   Hyperlipidemia    Hypertension    Left knee DJD    Myocardial infarction ([place]) 05/04/1995   Neuropathy    feet bilat    NSTEMI (non-ST elevated myocardial infarction), type 2 due to demand ischemia 10/07/2013   PAF (paroxysmal atrial fibrillation) ([place])    Pneumonia ~ 2007   again 3 months ago    Primary localized  osteoarthritis of right knee    Renal cell carcinoma ([place])    a. s/p right nephrectomy 08/14/09   Shortness of breath    a. related to chemo drugs   Tendonitis    currently on prednisone and wearing brace (left wrist)   Type II diabetes mellitus ([place])    "treating w/diet and weight loss"    Past Surgical History:  Procedure Laterality Date   CARDIAC CATHETERIZATION  05/2009   CARDIOVERSION N/A 10/08/2013   Procedure: CARDIOVERSION;  Surgeon: Dorothy Spark, MD;  Location: [facility];  Service: Cardiovascular;  Laterality: N/A;   CHEILECTOMY Right 04/13/2013   Procedure: Right Great Toe Cheilectomy, Debride Tendon and Abscess, Apply Stimulan Beads;  Surgeon: Newt Minion, MD;  Location: [facility];  Service: Orthopedics;  Laterality: Right;  Right Great Toe Cheilectomy, Debride Tendon and Abscess, Apply Stimulan Beads   colonscopy      CORONARY ANGIOPLASTY WITH STENT PLACEMENT  1997   at [facility] to [facility]  09/22/2011   Procedure: HERNIA REPAIR INGUINAL ADULT BILATERAL;  Surgeon: Imogene Burn. Georgette Dover, MD;  Location: [facility];  Service: General;  Laterality: Bilateral;   INGUINAL HERNIA REPAIR Bilateral 09/22/11   JOINT REPLACEMENT     KNEE ARTHROSCOPY Right ~  2011   KNEE ARTHROSCOPY Left 11/13/2012   Procedure: ARTHROSCOPY KNEE;  Surgeon: Lorn Junes, MD;  Location: [place] [facility];  Service: Orthopedics;  Laterality: Left;   LUMBAR LAMINECTOMY/DECOMPRESSION MICRODISCECTOMY N/A 03/17/2022   Procedure: Laminectomy and Foraminotomy - Lumbar two-three, Lumbar three-four, Lumbar four-five;  Surgeon: Eustace Moore, MD;  Location: [place];  Service: Neurosurgery;  Laterality: N/A;   LUNG BIOPSY  2011   MASS EXCISION Right 04/12/2014   Procedure: MASS EXCISION DORSUM OF RIGHT FOOT  ;  Surgeon: Tobi Bastos, MD;  Location: [facility];  Service: Orthopedics;  Laterality: Right;   NEPHRECTOMY Right 08/14/2009   ORCHIECTOMY  09/22/2011   Procedure: ORCHIECTOMY;  Surgeon: Franchot Gallo, MD;   Location: [facility];  Service: Urology;  Laterality: Left;  Left Testicular Exploration, Inguinal Approach,  Testicular    PILONIDAL CYST DRAINAGE     TEE WITHOUT CARDIOVERSION N/A 10/08/2013   Procedure: TRANSESOPHAGEAL ECHOCARDIOGRAM (TEE);  Surgeon: Dorothy Spark, MD;  Location: [facility];  Service: Cardiovascular;  Laterality: N/A;   [place] Left 11/13/2012   Procedure: TOTAL KNEE ARTHROPLASTY;  Surgeon: Lorn Junes, MD;  Location: [place];  Service: Orthopedics;  Laterality: Left;   TOTAL KNEE ARTHROPLASTY Right 07/22/2014   Procedure: RIGHT TOTAL KNEE ARTHROPLASTY;  Surgeon: Elsie Saas, MD;  Location: [place];  Service: Orthopedics;  Laterality: Right;   TOTAL SHOULDER REPLACEMENT Right 1997     Current Outpatient Medications  Medication Sig Dispense Refill   aspirin EC 81 MG tablet Take 1 tablet (81 mg total) by mouth daily. Swallow whole. 30 tablet 3   atorvastatin (LIPITOR) 10 MG tablet Take 1 tablet (10 mg total) by mouth daily with breakfast. 90 tablet 3   cetirizine (ZYRTEC) 10 MG tablet Take 10 mg by mouth daily. \     cholecalciferol (VITAMIN D) 1000 UNITS tablet Take 1,000 Units by mouth daily.     clindamycin (CLEOCIN) 150 MG capsule Take 600 mg by mouth See admin instructions. Take 600 mg by mouth one hour prior to dental treatment  2   Coenzyme Q10 100 MG capsule 100 mg daily.     Cyanocobalamin 1000 MCG SUBL as needed.     fexofenadine (ALLEGRA) 180 MG tablet 180 mg daily.     fluticasone (FLONASE) 50 MCG/ACT nasal spray Place 1 spray into both nostrils daily as needed for allergies.     magnesium gluconate (MAGONATE) 500 MG tablet Take 500 mg by mouth 2 (two) times daily.      metoprolol tartrate (LOPRESSOR) 25 MG tablet Take 1 tablet (25 mg total) by mouth 2 (two) times daily. 180 tablet 3   Multiple Vitamin (MULTIVITAMIN WITH MINERALS) TABS Take 1 tablet by mouth daily.     nitroGLYCERIN (NITROSTAT) 0.4 MG SL  tablet Place 1 tablet (0.4 mg total) under the tongue every 5 (five) minutes as needed for chest pain. 25 tablet 3   ONETOUCH VERIO test strip ck blood sugars Dx: E11.9 twice a day for 90 days     oxyCODONE (OXY IR/ROXICODONE) 5 MG immediate release tablet Take 1 tablet (5 mg total) by mouth every 4 (four) hours as needed for moderate pain ((score 4 to 6)). 30 tablet 0   Polyethyl Glycol-Propyl Glycol 0.4-0.3 % SOLN Place 1 drop into both eyes daily as needed (dry eyes).     telmisartan (MICARDIS) 80 MG tablet Take 1 tablet (80 mg total) by mouth every evening. [place]  tablet 3   thiamine (VITAMIN B1) 100 MG tablet daily.     vitamin C (ASCORBIC ACID) 500 MG tablet Take 500 mg by mouth daily.      No current facility-administered medications for this visit.    Allergies:   Penicillins, Ambien [zolpidem tartrate], Codeine, and Lisinopril    Social History:  The patient  reports that he quit smoking about 61 years ago. His smoking use included cigarettes. He has a 2.00 pack-year smoking history. He has never used smokeless tobacco. He reports current alcohol use of about 1.0 standard drink of alcohol per week. He reports that he does not use drugs.   Family History:  The patient's family history includes Dementia in his mother; Healthy in his daughter; Heart disease in his father; Hypertension in his father; Skin cancer in his son; Thyroid disease in his brother and mother.    ROS:  Please see the history of present illness.   Otherwise, review of systems are positive for joint pains.   All other systems are reviewed and negative.    PHYSICAL EXAM: VS:  BP 126/72   Pulse 73   Ht 5\' 11"  (1.803 m)   Wt 201 lb 3.2 oz (91.3 kg)   SpO2 97%   BMI 28.06 kg/m  , BMI Body mass index is 28.06 kg/m. GEN: Well nourished, well developed, in no acute distress HEENT: normal Neck: no JVD, carotid bruits, or masses Cardiac: RRR; no murmurs, rubs, or gallops,no edema  Respiratory:  clear to auscultation  bilaterally, normal work of breathing GI: soft, nontender, nondistended, + BS MS: no deformity or atrophy Skin: warm and dry, no rash Neuro:  Strength and sensation are intact Psych: euthymic mood, full affect   EKG:   The ekg ordered in 7/23 demonstrates NSR   Recent Labs: 03/10/2022: BUN 19; Creatinine, Ser 1.07; Hemoglobin 14.4; Platelets 183; Potassium 4.5; Sodium 136   Lipid Panel    Component Value Date/Time   CHOL 137 07/20/2017 1015   TRIG 190 (H) 07/20/2017 1015   HDL 35 (L) 07/20/2017 1015   CHOLHDL 3.9 07/20/2017 1015   CHOLHDL 3.1 10/06/2013 0442   VLDL 15 10/06/2013 0442   LDLCALC 64 07/20/2017 1015     Other studies Reviewed: Additional studies/ records that were reviewed today with results demonstrating: Labs reviewed LDL 79 in February 2024 HDL 53 A1c 6.6 total cholesterol 151.  LVEF 55%   ASSESSMENT AND PLAN:  CAD: Continue aggressive secondary prevention.  No angina. Uses a stationary bike.   Hoping to increase activity level. Chronic diastolic heart failure: appears euvolemic.  Hyperlipidemia: Whole food, plant-based diet.  High-fiber diet.  Avoid processed foods.  Continue atorvastatin PAF: No sx of AFib.  Hypertension: The current medical regimen is effective;  continue present plan and medications. Anticoagulated: Xarelto for stroke prevention in the past but this was stopped; ASA 81 mg daily only.  In the past, he has expressed frustration with the anticoagulation and the inability to take NSAIDs.  Quality of life benefit of NSAIDs versus benefit of stroke reduction from anticoagulation.  He is happy with the decision to stop Xarelto.  He takes his NSAIDs selectively due to protect his kidney.   Current medicines are reviewed at length with the patient today.  The patient concerns regarding his medicines were addressed.  The following changes have been made:  No change  Labs/ tests ordered today include:  No orders of the defined types were placed  in this encounter.  Recommend 150 minutes/week of aerobic exercise Low fat, low carb, high fiber diet recommended  Disposition:   FU in 1 year   Signed, Larae Grooms, MD  07/29/2022 3:43 PM    [facility] [place], [place], [place]  [address] Phone: [phone]; Fax: [phone]

## 2022-10-27 DIAGNOSIS — M1611 Unilateral primary osteoarthritis, right hip: Secondary | ICD-10-CM | POA: Diagnosis not present

## 2022-11-03 NOTE — Progress Notes (Addendum)
PCP - LOV 06-25-22 epic Rachael Darby  Cardiologist - Varney Daily , MD LOV 07-29-22 epic   Clearance 05-24-22 epic  PPM/ICD -  Device Orders -  Rep Notified -   Chest x-ray -  EKG -  Stress Test - 2017 ECHO - 12-15-2021 epic Cardiac Cath -   Sleep Study -  CPAP -   Fasting Blood Sugar -  Checks Blood Sugar _____ times a day  Blood Thinner Instructions: Aspirin Instructions:  ERAS Protcol - PRE-SURGERY Ensure or G2-   COVID TEST-  COVID vaccine -  Activity-- Anesthesia review:CKD CHF, CAD, A-fib,HTN, DM , renal cell carcinoma  Patient denies shortness of breath, fever, cough and chest pain at PAT appointment   All instructions explained to the patient, with a verbal understanding of the material. Patient agrees to go over the instructions while at home for a better understanding. Patient also instructed to self quarantine after being tested for COVID-19. The opportunity to ask questions was provided.

## 2022-11-03 NOTE — Progress Notes (Signed)
Please place orders in epic pt. Is scheduled for preop 

## 2022-11-05 NOTE — Patient Instructions (Addendum)
SURGICAL WAITING ROOM VISITATION  Patients having surgery or a procedure may have no more than 2 support people in the waiting area - these visitors may rotate.    Children under the age of 16 must have an adult with them who is not the patient.  D  If the patient needs to stay at the hospital during part of their recovery, the visitor guidelines for inpatient rooms apply. Pre-op nurse will coordinate an appropriate time for 1 support person to accompany patient in pre-op.  This support person may not rotate.    Please refer to the [facility] website for the visitor guidelines for Inpatients (after your surgery is over and you are in a regular room).       Your procedure is scheduled on: 11-30-22   Report to [facility] Main Entrance    Report to admitting at        0730 AM   Call this number if you have problems the morning of surgery [phone]   Do not eat food :After Midnight.   After Midnight you may have the following liquids until __0700____ AM DAY OF SURGERY  Water Non-Citrus Juices (without pulp, NO RED-Apple, White grape, White cranberry) Black Coffee (NO MILK/CREAM OR CREAMERS, sugar ok)  Clear Tea (NO MILK/CREAM OR CREAMERS, sugar ok) regular and decaf                             Plain Jell-O (NO RED)                                           Fruit ices (not with fruit pulp, NO RED)                                     Popsicles (NO RED)                                                               Sports drinks like Gatorade (NO RED)                  The day of surgery:  Drink ONE (1) Pre-Surgery  G2 at   completed by 0700 AM the morning of surgery. Drink in one sitting. Do not sip.  This drink was given to you during your hospital  pre-op appointment visit. Nothing else to drink after completing the  Pre-Surgery  G2.0700 am          If you have questions, please contact your surgeon's office.   FOLLOW BOWEL PREP AND ANY ADDITIONAL PRE OP  INSTRUCTIONS YOU RECEIVED FROM YOUR SURGEON'S OFFICE!!!     Oral Hygiene is also important to reduce your risk of infection.                                    Remember - BRUSH YOUR TEETH THE MORNING OF SURGERY WITH YOUR REGULAR TOOTHPASTE  DENTURES WILL BE REMOVED PRIOR TO SURGERY PLEASE DO NOT  APPLY "Poly grip" OR ADHESIVES!!!   Do NOT smoke after Midnight   Take these medicines the morning of surgery with A SIP OF WATER:   DO NOT TAKE ANY ORAL DIABETIC MEDICATIONS DAY OF YOUR SURGERY  Bring CPAP mask and tubing day of surgery.                              You may not have any metal on your body including hair pins, jewelry, and body piercing             Do not wear  lotions, powders, perfumes/cologne, or deodorant                Men may shave face and neck.   Do not bring valuables to the hospital. [facility] IS NOT             RESPONSIBLE   FOR VALUABLES.   Contacts, glasses, dentures or bridgework may not be worn into surgery.   Bring small overnight bag day of surgery.   DO NOT BRING YOUR HOME MEDICATIONS TO THE HOSPITAL. PHARMACY WILL DISPENSE MEDICATIONS LISTED ON YOUR MEDICATION LIST TO YOU DURING YOUR ADMISSION IN THE HOSPITAL!    Patients discharged on the day of surgery will not be allowed to drive home.  Someone NEEDS to stay with you for the first 24 hours after anesthesia.   Special Instructions: Bring a copy of your healthcare power of attorney and living will documents the day of surgery if you haven't scanned them before.              Please read over the following fact sheets you were given: IF YOU HAVE QUESTIONS ABOUT YOUR PRE-OP INSTRUCTIONS PLEASE CALL [phone]    If you test positive for Covid or have been in contact with anyone that has tested positive in the last 10 days please notify you surgeon.      Pre-operative 5 CHG Bath Instructions   You can play a key role in reducing the risk of infection after surgery. Your skin needs to be as  free of germs as possible. You can reduce the number of germs on your skin by washing with CHG (chlorhexidine gluconate) soap before surgery. CHG is an antiseptic soap that kills germs and continues to kill germs even after washing.   DO NOT use if you have an allergy to chlorhexidine/CHG or antibacterial soaps. If your skin becomes reddened or irritated, stop using the CHG and notify one of our RNs at [phone].   Please shower with the CHG soap starting 4 days before surgery using the following schedule:     Please keep in mind the following:  DO NOT shave, including legs and underarms, starting the day of your first shower.   You may shave your face at any point before/day of surgery.  Place clean sheets on your bed the day you start using CHG soap. Use a clean washcloth (not used since being washed) for each shower. DO NOT sleep with pets once you start using the CHG.   CHG Shower Instructions:  If you choose to wash your hair and private area, wash first with your normal shampoo/soap.  After you use shampoo/soap, rinse your hair and body thoroughly to remove shampoo/soap residue.  Turn the water OFF and apply about 3 tablespoons (45 ml) of CHG soap to a CLEAN washcloth.  Apply CHG soap ONLY FROM YOUR NECK DOWN  TO YOUR TOES (washing for 3-5 minutes)  DO NOT use CHG soap on face, private areas, open wounds, or sores.  Pay special attention to the area where your surgery is being performed.  If you are having back surgery, having someone wash your back for you may be helpful. Wait 2 minutes after CHG soap is applied, then you may rinse off the CHG soap.  Pat dry with a clean towel  Put on clean clothes/pajamas   If you choose to wear lotion, please use ONLY the CHG-compatible lotions on the back of this paper.     Additional instructions for the day of surgery: DO NOT APPLY any lotions, deodorants, cologne, or perfumes.   Put on clean/comfortable clothes.  Brush your teeth.  Ask  your nurse before applying any prescription medications to the skin.      CHG Compatible Lotions   Aveeno Moisturizing lotion  Cetaphil Moisturizing Cream  Cetaphil Moisturizing Lotion  Clairol Herbal Essence Moisturizing Lotion, Dry Skin  Clairol Herbal Essence Moisturizing Lotion, Extra Dry Skin  Clairol Herbal Essence Moisturizing Lotion, Normal Skin  Curel Age Defying Therapeutic Moisturizing Lotion with Alpha Hydroxy  Curel Extreme Care Body Lotion  Curel Soothing Hands Moisturizing Hand Lotion  Curel Therapeutic Moisturizing Cream, Fragrance-Free  Curel Therapeutic Moisturizing Lotion, Fragrance-Free  Curel Therapeutic Moisturizing Lotion, Original Formula  Eucerin Daily Replenishing Lotion  Eucerin Dry Skin Therapy Plus Alpha Hydroxy Crme  Eucerin Dry Skin Therapy Plus Alpha Hydroxy Lotion  Eucerin Original Crme  Eucerin Original Lotion  Eucerin Plus Crme Eucerin Plus Lotion  Eucerin TriLipid Replenishing Lotion  Keri Anti-Bacterial Hand Lotion  Keri Deep Conditioning Original Lotion Dry Skin Formula Softly Scented  Keri Deep Conditioning Original Lotion, Fragrance Free Sensitive Skin Formula  Keri Lotion Fast Absorbing Fragrance Free Sensitive Skin Formula  Keri Lotion Fast Absorbing Softly Scented Dry Skin Formula  Keri Original Lotion  Keri Skin Renewal Lotion Keri Silky Smooth Lotion  Keri Silky Smooth Sensitive Skin Lotion  Nivea Body Creamy Conditioning Oil  Nivea Body Extra Enriched Lotion  Nivea Body Original Lotion  Nivea Body Sheer Moisturizing Lotion Nivea Crme  Nivea Skin Firming Lotion  NutraDerm 30 Skin Lotion  NutraDerm Skin Lotion  NutraDerm Therapeutic Skin Cream  NutraDerm Therapeutic Skin Lotion  ProShield Protective Hand Cream   Incentive Spirometer  An incentive spirometer is a tool that can help keep your lungs clear and active. This tool measures how well you are filling your lungs with each breath. Taking long deep breaths may help  reverse or decrease the chance of developing breathing (pulmonary) problems (especially infection) following: A long period of time when you are unable to move or be active. BEFORE THE PROCEDURE  If the spirometer includes an indicator to show your best effort, your nurse or respiratory therapist will set it to a desired goal. If possible, sit up straight or lean slightly forward. Try not to slouch. Hold the incentive spirometer in an upright position. INSTRUCTIONS FOR USE  Sit on the edge of your bed if possible, or sit up as far as you can in bed or on a chair. Hold the incentive spirometer in an upright position. Breathe out normally. Place the mouthpiece in your mouth and seal your lips tightly around it. Breathe in slowly and as deeply as possible, raising the piston or the ball toward the top of the column. Hold your breath for 3-5 seconds or for as long as possible. Allow the piston or ball to fall to  the bottom of the column. Remove the mouthpiece from your mouth and breathe out normally. Rest for a few seconds and repeat Steps 1 through 7 at least 10 times every 1-2 hours when you are awake. Take your time and take a few normal breaths between deep breaths. The spirometer may include an indicator to show your best effort. Use the indicator as a goal to work toward during each repetition. After each set of 10 deep breaths, practice coughing to be sure your lungs are clear. If you have an incision (the cut made at the time of surgery), support your incision when coughing by placing a pillow or rolled up towels firmly against it. Once you are able to get out of bed, walk around indoors and cough well. You may stop using the incentive spirometer when instructed by your caregiver.  RISKS AND COMPLICATIONS Take your time so you do not get dizzy or light-headed. If you are in pain, you may need to take or ask for pain medication before doing incentive spirometry. It is harder to take a deep  breath if you are having pain. AFTER USE Rest and breathe slowly and easily. It can be helpful to keep track of a log of your progress. Your caregiver can provide you with a simple table to help with this. If you are using the spirometer at home, follow these instructions: SEEK MEDICAL CARE IF:  You are having difficultly using the spirometer. You have trouble using the spirometer as often as instructed. Your pain medication is not giving enough relief while using the spirometer. You develop fever of 100.5 F (38.1 C) or higher. SEEK IMMEDIATE MEDICAL CARE IF:  You cough up bloody sputum that had not been present before. You develop fever of 102 F (38.9 C) or greater. You develop worsening pain at or near the incision site. MAKE SURE YOU:  Understand these instructions. Will watch your condition. Will get help right away if you are not doing well or get worse. Document Released: 08/30/2006 Document Revised: 07/12/2011 Document Reviewed: 10/31/2006 [employer] Patient Information 2014 [employer], [place].   ________________________________________________________________________ WHAT IS A BLOOD TRANSFUSION? Blood Transfusion Information  A transfusion is the replacement of blood or some of its parts. Blood is made up of multiple cells which provide different functions. Red blood cells carry oxygen and are used for blood loss replacement. White blood cells fight against infection. Platelets control bleeding. Plasma helps clot blood. Other blood products are available for specialized needs, such as hemophilia or other clotting disorders. BEFORE THE TRANSFUSION  Who gives blood for transfusions?  Healthy volunteers who are fully evaluated to make sure their blood is safe. This is blood bank blood. Transfusion therapy is the safest it has ever been in the practice of medicine. Before blood is taken from a donor, a complete history is taken to make sure that person has no history of diseases  nor engages in risky social behavior (examples are intravenous drug use or sexual activity with multiple partners). The donor's travel history is screened to minimize risk of transmitting infections, such as malaria. The donated blood is tested for signs of infectious diseases, such as HIV and hepatitis. The blood is then tested to be sure it is compatible with you in order to minimize the chance of a transfusion reaction. If you or a relative donates blood, this is often done in anticipation of surgery and is not appropriate for emergency situations. It takes many days to process the donated blood. RISKS  AND COMPLICATIONS Although transfusion therapy is very safe and saves many lives, the main dangers of transfusion include:  Getting an infectious disease. Developing a transfusion reaction. This is an allergic reaction to something in the blood you were given. Every precaution is taken to prevent this. The decision to have a blood transfusion has been considered carefully by your caregiver before blood is given. Blood is not given unless the benefits outweigh the risks. AFTER THE TRANSFUSION Right after receiving a blood transfusion, you will usually feel much better and more energetic. This is especially true if your red blood cells have gotten low (anemic). The transfusion raises the level of the red blood cells which carry oxygen, and this usually causes an energy increase. The nurse administering the transfusion will monitor you carefully for complications. HOME CARE INSTRUCTIONS  No special instructions are needed after a transfusion. You may find your energy is better. Speak with your caregiver about any limitations on activity for underlying diseases you may have. SEEK MEDICAL CARE IF:  Your condition is not improving after your transfusion. You develop redness or irritation at the intravenous (IV) site. SEEK IMMEDIATE MEDICAL CARE IF:  Any of the following symptoms occur over the next 12  hours: Shaking chills. You have a temperature by mouth above 102 F (38.9 C), not controlled by medicine. Chest, back, or muscle pain. People around you feel you are not acting correctly or are confused. Shortness of breath or difficulty breathing. Dizziness and fainting. You get a rash or develop hives. You have a decrease in urine output. Your urine turns a dark color or changes to pink, red, or brown. Any of the following symptoms occur over the next 10 days: You have a temperature by mouth above 102 F (38.9 C), not controlled by medicine. Shortness of breath. Weakness after normal activity. The white part of the eye turns yellow (jaundice). You have a decrease in the amount of urine or are urinating less often. Your urine turns a dark color or changes to pink, red, or brown. Document Released: 04/16/2000 Document Revised: 07/12/2011 Document Reviewed: 12/04/2007 [facility] Patient Information 2014 [place], [place].  _______________________________________________________________________

## 2022-11-15 DIAGNOSIS — M1611 Unilateral primary osteoarthritis, right hip: Secondary | ICD-10-CM | POA: Diagnosis not present

## 2022-11-16 NOTE — Care Plan (Signed)
Ortho Bundle Case Management Note  Patient Details  Name: Donald Gray MRN: [id number] Date of Birth: 09/29/1940 Will discharge to home with family to assist. Rolling walker ordered. HHPT referral to [facility] and OPPT set up with [facility]. Discharge instructions discussed. Patient and MD in agreement with plan. Choice offered                     DME Arranged:  Walker rolling DME Agency:  [employer]  HH Arranged:  PT HH Agency:  [facility]  Additional Comments: Please contact me with any questions of if this plan should need to change.  Shauna Hugh,  RN,BSN,MHA,CCM  [facility] Orthopaedic Specialist  [phone] 11/16/2022, 2:01 PM

## 2022-11-19 ENCOUNTER — Encounter (HOSPITAL_COMMUNITY)
Admission: RE | Admit: 2022-11-19 | Discharge: 2022-11-19 | Disposition: A | Discharge status: Home / Self Care | Payer: Medicare HMO | Source: Ambulatory Visit | Attending: Orthopedic Surgery | Admitting: Orthopedic Surgery

## 2022-11-19 ENCOUNTER — Encounter (HOSPITAL_COMMUNITY): Payer: Self-pay

## 2022-11-19 DIAGNOSIS — I25119 Atherosclerotic heart disease of native coronary artery with unspecified angina pectoris: Secondary | ICD-10-CM

## 2022-11-19 DIAGNOSIS — E139 Other specified diabetes mellitus without complications: Secondary | ICD-10-CM

## 2022-11-19 DIAGNOSIS — Z01818 Encounter for other preprocedural examination: Secondary | ICD-10-CM

## 2022-11-25 NOTE — Patient Instructions (Signed)
DUE TO COVID-19 ONLY TWO VISITORS  (aged 82 and older)  ARE ALLOWED TO COME WITH YOU AND STAY IN THE WAITING ROOM ONLY DURING PRE OP AND PROCEDURE.   **NO VISITORS ARE ALLOWED IN THE SHORT STAY AREA OR RECOVERY ROOM!!**  IF YOU WILL BE ADMITTED INTO THE HOSPITAL YOU ARE ALLOWED ONLY FOUR SUPPORT PEOPLE DURING VISITATION HOURS ONLY (7 AM -8PM)   The support person(s) must pass our screening, gel in and out, and wear a mask at all times, including in the patient's room. Patients must also wear a mask when staff or their support person are in the room. Visitors GUEST BADGE MUST BE WORN VISIBLY  One adult visitor may remain with you overnight and MUST be in the room by 8 P.M.     Your procedure is scheduled on: 11/30/22   Report to [facility] Main Entrance    Report to admitting at : 7:30 AM   Call this number if you have problems the morning of surgery [phone]   Do not eat food :After Midnight.   After Midnight you may have the following liquids until : 7:00 AM DAY OF SURGERY  Water Black Coffee (sugar ok, NO MILK/CREAM OR CREAMERS)  Tea (sugar ok, NO MILK/CREAM OR CREAMERS) regular and decaf                             Plain Jell-O (NO RED)                                           Fruit ices (not with fruit pulp, NO RED)                                     Popsicles (NO RED)                                                                  Juice: apple, WHITE grape, WHITE cranberry Sports drinks like Gatorade (NO RED)   The day of surgery:  Drink ONE (1) Pre-Surgery Clear or G2 : at : 7:30 AM the morning of surgery. Drink in one sitting. Do not sip.  This drink was given to you during your hospital  pre-op appointment visit. Nothing else to drink after completing the  Pre-Surgery Clear Ensure or G2.          If you have questions, please contact your surgeon's office.    Oral Hygiene is also important to reduce your risk of infection.                                     Remember - BRUSH YOUR TEETH THE MORNING OF SURGERY WITH YOUR REGULAR TOOTHPASTE  DENTURES WILL BE REMOVED PRIOR TO SURGERY PLEASE DO NOT APPLY "Poly grip" OR ADHESIVES!!!   Do NOT smoke after Midnight   Take these medicines the morning of surgery with A SIP OF WATER: cetirizine,fexofenadine,metoprolol.  How to Manage  Your Diabetes Before and After Surgery  Why is it important to control my blood sugar before and after surgery? Improving blood sugar levels before and after surgery helps healing and can limit problems. A way of improving blood sugar control is eating a healthy diet by:  Eating less sugar and carbohydrates  Increasing activity/exercise  Talking with your doctor about reaching your blood sugar goals High blood sugars (greater than 180 mg/dL) can raise your risk of infections and slow your recovery, so you will need to focus on controlling your diabetes during the weeks before surgery. Make sure that the doctor who takes care of your diabetes knows about your planned surgery including the date and location.  How do I manage my blood sugar before surgery? Check your blood sugar at least 4 times a day, starting 2 days before surgery, to make sure that the level is not too high or low. Check your blood sugar the morning of your surgery when you wake up and every 2 hours until you get to the Short Stay unit. If your blood sugar is less than 70 mg/dL, you will need to treat for low blood sugar: Do not take insulin. Treat a low blood sugar (less than 70 mg/dL) with  cup of clear juice (cranberry or apple), 4 glucose tablets, OR glucose gel. Recheck blood sugar in 15 minutes after treatment (to make sure it is greater than 70 mg/dL). If your blood sugar is not greater than 70 mg/dL on recheck, call [phone] for further instructions. Report your blood sugar to the short stay nurse when you get to Short Stay.  If you are admitted to the hospital after surgery: Your blood  sugar will be checked by the staff and you will probably be given insulin after surgery (instead of oral diabetes medicines) to make sure you have good blood sugar levels. The goal for blood sugar control after surgery is 80-180 mg/dL.   WHAT DO I DO ABOUT MY DIABETES MEDICATION?  DO NOT TAKE ANY ORAL DIABETIC MEDICATIONS DAY OF YOUR SURGERY                              You may not have any metal on your body including hair pins, jewelry, and body piercing             Do not wear lotions, powders, perfumes/cologne, or deodorant              Men may shave face and neck.   Do not bring valuables to the hospital. [facility] IS NOT             RESPONSIBLE   FOR VALUABLES.   Contacts, glasses, or bridgework may not be worn into surgery.   Bring small overnight bag day of surgery.   DO NOT BRING YOUR HOME MEDICATIONS TO THE HOSPITAL. PHARMACY WILL DISPENSE MEDICATIONS LISTED ON YOUR MEDICATION LIST TO YOU DURING YOUR ADMISSION IN THE HOSPITAL!    Patients discharged on the day of surgery will not be allowed to drive home.  Someone NEEDS to stay with you for the first 24 hours after anesthesia.   Special Instructions: Bring a copy of your healthcare power of attorney and living will documents         the day of surgery if you haven't scanned them before.              Please read over the following  fact sheets you were given: IF YOU HAVE QUESTIONS ABOUT YOUR PRE-OP INSTRUCTIONS PLEASE CALL [phone]      Pre-operative 5 CHG Bath Instructions   You can play a key role in reducing the risk of infection after surgery. Your skin needs to be as free of germs as possible. You can reduce the number of germs on your skin by washing with CHG (chlorhexidine gluconate) soap before surgery. CHG is an antiseptic soap that kills germs and continues to kill germs even after washing.   DO NOT use if you have an allergy to chlorhexidine/CHG or antibacterial soaps. If your skin becomes reddened or  irritated, stop using the CHG and notify one of our RNs at : [phone].   Please shower with the CHG soap starting 4 days before surgery using the following schedule:     Please keep in mind the following:  DO NOT shave, including legs and underarms, starting the day of your first shower.   You may shave your face at any point before/day of surgery.  Place clean sheets on your bed the day you start using CHG soap. Use a clean washcloth (not used since being washed) for each shower. DO NOT sleep with pets once you start using the CHG.   CHG Shower Instructions:  If you choose to wash your hair and private area, wash first with your normal shampoo/soap.  After you use shampoo/soap, rinse your hair and body thoroughly to remove shampoo/soap residue.  Turn the water OFF and apply about 3 tablespoons (45 ml) of CHG soap to a CLEAN washcloth.  Apply CHG soap ONLY FROM YOUR NECK DOWN TO YOUR TOES (washing for 3-5 minutes)  DO NOT use CHG soap on face, private areas, open wounds, or sores.  Pay special attention to the area where your surgery is being performed.  If you are having back surgery, having someone wash your back for you may be helpful. Wait 2 minutes after CHG soap is applied, then you may rinse off the CHG soap.  Pat dry with a clean towel  Put on clean clothes/pajamas   If you choose to wear lotion, please use ONLY the CHG-compatible lotions on the back of this paper.     Additional instructions for the day of surgery: DO NOT APPLY any lotions, deodorants, cologne, or perfumes.   Put on clean/comfortable clothes.  Brush your teeth.  Ask your nurse before applying any prescription medications to the skin.      CHG Compatible Lotions   Aveeno Moisturizing lotion  Cetaphil Moisturizing Cream  Cetaphil Moisturizing Lotion  Clairol Herbal Essence Moisturizing Lotion, Dry Skin  Clairol Herbal Essence Moisturizing Lotion, Extra Dry Skin  Clairol Herbal Essence  Moisturizing Lotion, Normal Skin  Curel Age Defying Therapeutic Moisturizing Lotion with Alpha Hydroxy  Curel Extreme Care Body Lotion  Curel Soothing Hands Moisturizing Hand Lotion  Curel Therapeutic Moisturizing Cream, Fragrance-Free  Curel Therapeutic Moisturizing Lotion, Fragrance-Free  Curel Therapeutic Moisturizing Lotion, Original Formula  Eucerin Daily Replenishing Lotion  Eucerin Dry Skin Therapy Plus Alpha Hydroxy Crme  Eucerin Dry Skin Therapy Plus Alpha Hydroxy Lotion  Eucerin Original Crme  Eucerin Original Lotion  Eucerin Plus Crme Eucerin Plus Lotion  Eucerin TriLipid Replenishing Lotion  Keri Anti-Bacterial Hand Lotion  Keri Deep Conditioning Original Lotion Dry Skin Formula Softly Scented  Keri Deep Conditioning Original Lotion, Fragrance Free Sensitive Skin Formula  Keri Lotion Fast Absorbing Fragrance Free Sensitive Skin Formula  Keri Lotion Fast Absorbing Softly Scented  Dry Skin Formula  Keri Original Lotion  Keri Skin Renewal Lotion Keri Silky Smooth Lotion  Keri Silky Smooth Sensitive Skin Lotion  Nivea Body Creamy Conditioning Oil  Nivea Body Extra Enriched Lotion  Nivea Body Original Lotion  Nivea Body Sheer Moisturizing Lotion Nivea Crme  Nivea Skin Firming Lotion  NutraDerm 30 Skin Lotion  NutraDerm Skin Lotion  NutraDerm Therapeutic Skin Cream  NutraDerm Therapeutic Skin Lotion  ProShield Protective Hand Cream  Provon moisturizing lotion   Incentive Spirometer  An incentive spirometer is a tool that can help keep your lungs clear and active. This tool measures how well you are filling your lungs with each breath. Taking long deep breaths may help reverse or decrease the chance of developing breathing (pulmonary) problems (especially infection) following: A long period of time when you are unable to move or be active. BEFORE THE PROCEDURE  If the spirometer includes an indicator to show your best effort, your nurse or respiratory therapist will set  it to a desired goal. If possible, sit up straight or lean slightly forward. Try not to slouch. Hold the incentive spirometer in an upright position. INSTRUCTIONS FOR USE  Sit on the edge of your bed if possible, or sit up as far as you can in bed or on a chair. Hold the incentive spirometer in an upright position. Breathe out normally. Place the mouthpiece in your mouth and seal your lips tightly around it. Breathe in slowly and as deeply as possible, raising the piston or the ball toward the top of the column. Hold your breath for 3-5 seconds or for as long as possible. Allow the piston or ball to fall to the bottom of the column. Remove the mouthpiece from your mouth and breathe out normally. Rest for a few seconds and repeat Steps 1 through 7 at least 10 times every 1-2 hours when you are awake. Take your time and take a few normal breaths between deep breaths. The spirometer may include an indicator to show your best effort. Use the indicator as a goal to work toward during each repetition. After each set of 10 deep breaths, practice coughing to be sure your lungs are clear. If you have an incision (the cut made at the time of surgery), support your incision when coughing by placing a pillow or rolled up towels firmly against it. Once you are able to get out of bed, walk around indoors and cough well. You may stop using the incentive spirometer when instructed by your caregiver.  RISKS AND COMPLICATIONS Take your time so you do not get dizzy or light-headed. If you are in pain, you may need to take or ask for pain medication before doing incentive spirometry. It is harder to take a deep breath if you are having pain. AFTER USE Rest and breathe slowly and easily. It can be helpful to keep track of a log of your progress. Your caregiver can provide you with a simple table to help with this. If you are using the spirometer at home, follow these instructions: SEEK MEDICAL CARE IF:  You are  having difficultly using the spirometer. You have trouble using the spirometer as often as instructed. Your pain medication is not giving enough relief while using the spirometer. You develop fever of 100.5 F (38.1 C) or higher. SEEK IMMEDIATE MEDICAL CARE IF:  You cough up bloody sputum that had not been present before. You develop fever of 102 F (38.9 C) or greater. You develop worsening pain at  or near the incision site. MAKE SURE YOU:  Understand these instructions. Will watch your condition. Will get help right away if you are not doing well or get worse. Document Released: 08/30/2006 Document Revised: 07/12/2011 Document Reviewed: 10/31/2006 [facility] Patient Information 2014 [place], [place].   ________________________________________________________________________

## 2022-11-26 ENCOUNTER — Encounter (HOSPITAL_COMMUNITY): Payer: Self-pay

## 2022-11-26 ENCOUNTER — Encounter (HOSPITAL_COMMUNITY)
Admission: RE | Admit: 2022-11-26 | Discharge: 2022-11-26 | Disposition: A | Discharge status: Home / Self Care | Payer: Medicare HMO | Source: Ambulatory Visit | Attending: Orthopedic Surgery | Admitting: Orthopedic Surgery

## 2022-11-26 ENCOUNTER — Other Ambulatory Visit: Payer: Self-pay

## 2022-11-26 DIAGNOSIS — E139 Other specified diabetes mellitus without complications: Secondary | ICD-10-CM

## 2022-11-26 DIAGNOSIS — Z87891 Personal history of nicotine dependence: Secondary | ICD-10-CM | POA: Insufficient documentation

## 2022-11-26 DIAGNOSIS — I25119 Atherosclerotic heart disease of native coronary artery with unspecified angina pectoris: Secondary | ICD-10-CM | POA: Diagnosis not present

## 2022-11-26 DIAGNOSIS — E119 Type 2 diabetes mellitus without complications: Secondary | ICD-10-CM | POA: Insufficient documentation

## 2022-11-26 DIAGNOSIS — I10 Essential (primary) hypertension: Secondary | ICD-10-CM | POA: Insufficient documentation

## 2022-11-26 DIAGNOSIS — Z01818 Encounter for other preprocedural examination: Secondary | ICD-10-CM

## 2022-11-26 DIAGNOSIS — I480 Paroxysmal atrial fibrillation: Secondary | ICD-10-CM | POA: Diagnosis not present

## 2022-11-26 DIAGNOSIS — M1611 Unilateral primary osteoarthritis, right hip: Secondary | ICD-10-CM | POA: Insufficient documentation

## 2022-11-26 LAB — CBC
HCT: 44.4 % (ref 39.0–52.0)
Hemoglobin: 14.8 g/dL (ref 13.0–17.0)
MCH: 30.4 pg (ref 26.0–34.0)
MCHC: 33.3 g/dL (ref 30.0–36.0)
MCV: 91.2 fL (ref 80.0–100.0)
Platelets: 179 10*3/uL (ref 150–400)
RBC: 4.87 MIL/uL (ref 4.22–5.81)
RDW: 13.2 % (ref 11.5–15.5)
WBC: 6.8 10*3/uL (ref 4.0–10.5)
nRBC: 0 % (ref 0.0–0.2)

## 2022-11-26 LAB — GLUCOSE, CAPILLARY: Glucose-Capillary: 135 mg/dL — ABNORMAL HIGH (ref 70–99)

## 2022-11-26 LAB — TYPE AND SCREEN
ABO/RH(D): A POS
Antibody Screen: NEGATIVE

## 2022-11-26 LAB — BASIC METABOLIC PANEL
Anion gap: 9 (ref 5–15)
BUN: 21 mg/dL (ref 8–23)
CO2: 22 mmol/L (ref 22–32)
Calcium: 9 mg/dL (ref 8.9–10.3)
Chloride: 104 mmol/L (ref 98–111)
Creatinine, Ser: 1.01 mg/dL (ref 0.61–1.24)
GFR, Estimated: >60 mL/min (ref >60)
Glucose, Bld: 152 mg/dL — ABNORMAL HIGH (ref 70–99)
Potassium: 4.3 mmol/L (ref 3.5–5.1)
Sodium: 135 mmol/L (ref 135–145)

## 2022-11-26 LAB — SURGICAL PCR SCREEN
MRSA, PCR: NEGATIVE
Staphylococcus aureus: NEGATIVE

## 2022-11-26 NOTE — Progress Notes (Addendum)
CBG's x 2 a day: 100's : fasting. Aspirin: will be hold after: 11/27/22 Cardiac Cath: 2011. EKG: 11/26/22.

## 2022-11-29 NOTE — H&P (Signed)
HIP ARTHROPLASTY ADMISSION H&P  Patient ID: Donald Gray MRN: [id number] DOB/AGE: 07-08-1940 82 y.o.  Chief Complaint: right hip pain.  Planned Procedure Date: 11/30/22 Medical Clearance by Dr. Orson Aloe Additional clearance by Dr. Nelson Chimes (oncology)  HPI: Donald Gray is a 82 y.o. male who presents for evaluation of right hip DJD. The patient has a history of pain and functional disability in the right hip due to arthritis and has failed non-surgical conservative treatments for greater than 12 weeks to include NSAID's and/or analgesics, corticosteriod injections, use of assistive devices, and activity modification.  Onset of symptoms was gradual, starting 1 years ago with rapidlly worsening course since that time. The patient noted no past surgery on the right hip.  Patient currently rates pain at 5 out of 10 with activity. Patient has night pain, worsening of pain with activity and weight bearing, pain that interferes with activities of daily living, and pain with passive range of motion.  Patient has evidence of joint space narrowing by imaging studies.  There is no active infection.  Past Medical History:  Diagnosis Date   Anemia    Anticoagulated- to go to Xarelto for a fib 10/07/2013   Arthritis    "fingers, toes" (10/05/2013)   Coronary artery disease    a. s/p MI with stent in 2000   Diverticulosis    Dysrhythmia    GERD (gastroesophageal reflux disease)    History of blood transfusion    "w/nephrectomy; w/shoulder replacement; w/knee replacement"   Hyperlipidemia    Hypertension    Left knee DJD    Myocardial infarction (HCC) 05/04/1995   Neuropathy    feet bilat    NSTEMI (non-ST elevated myocardial infarction), type 2 due to demand ischemia 10/07/2013   PAF (paroxysmal atrial fibrillation) (HCC)    Pneumonia ~ 2007   again 3 months ago    Primary localized osteoarthritis of right knee    Renal cell carcinoma (HCC)    a. s/p right nephrectomy 08/14/09    Shortness of breath    a. related to chemo drugs   Tendonitis    currently on prednisone and wearing brace (left wrist)   Type II diabetes mellitus (HCC)    "treating w/diet and weight loss"   Past Surgical History:  Procedure Laterality Date   CARDIAC CATHETERIZATION  05/2009   CARDIOVERSION N/A 10/08/2013   Procedure: CARDIOVERSION;  Surgeon: Lars Masson, MD;  Location: [facility] ENDOSCOPY;  Service: Cardiovascular;  Laterality: N/A;   CHEILECTOMY Right 04/13/2013   Procedure: Right Great Toe Cheilectomy, Debride Tendon and Abscess, Apply Stimulan Beads;  Surgeon: Nadara Mustard, MD;  Location: [facility] OR;  Service: Orthopedics;  Laterality: Right;  Right Great Toe Cheilectomy, Debride Tendon and Abscess, Apply Stimulan Beads   colonscopy      CORONARY ANGIOPLASTY WITH STENT PLACEMENT  1997   at [facility] to RCA    INGUINAL HERNIA REPAIR  09/22/2011   Procedure: HERNIA REPAIR INGUINAL ADULT BILATERAL;  Surgeon: Wilmon Arms. Corliss Skains, MD;  Location: [facility] ORS;  Service: General;  Laterality: Bilateral;   INGUINAL HERNIA REPAIR Bilateral 09/22/11   JOINT REPLACEMENT     KNEE ARTHROSCOPY Right ~ 2011   KNEE ARTHROSCOPY Left 11/13/2012   Procedure: ARTHROSCOPY KNEE;  Surgeon: Nilda Simmer, MD;  [facility] OR;  Service: Orthopedics;  Laterality: Left;   LUMBAR LAMINECTOMY/DECOMPRESSION MICRODISCECTOMY N/A 03/17/2022   Procedure: Laminectomy and Foraminotomy - Lumbar two-three, Lumbar three-four, Lumbar four-five;  Surgeon: Tia Alert, MD;  Location:  [facility] OR;  Service: Neurosurgery;  Laterality: N/A;   LUNG BIOPSY  2011   MASS EXCISION Right 04/12/2014   Procedure: MASS EXCISION DORSUM OF RIGHT FOOT  ;  Surgeon: Jacki Cones, MD;  Location: [facility] ORS;  Service: Orthopedics;  Laterality: Right;   NEPHRECTOMY Right 08/14/2009   ORCHIECTOMY  09/22/2011   Procedure: ORCHIECTOMY;  Surgeon: Marcine Matar, MD;  Location: [facility] ORS;  Service: Urology;  Laterality: Left;  Left Testicular Exploration, Inguinal  Approach,  Testicular    PILONIDAL CYST DRAINAGE     TEE WITHOUT CARDIOVERSION N/A 10/08/2013   Procedure: TRANSESOPHAGEAL ECHOCARDIOGRAM (TEE);  Surgeon: Lars Masson, MD;  Location: [facility] ENDOSCOPY;  Service: Cardiovascular;  Laterality: N/A;   TONSILLECTOMY AND ADENOIDECTOMY  1948   TOTAL KNEE ARTHROPLASTY Left 11/13/2012   Procedure: TOTAL KNEE ARTHROPLASTY;  Surgeon: Nilda Simmer, MD;  Location: [facility] OR;  Service: Orthopedics;  Laterality: Left;   TOTAL KNEE ARTHROPLASTY Right 07/22/2014   Procedure: RIGHT TOTAL KNEE ARTHROPLASTY;  Surgeon: Salvatore Marvel, MD;  Location[facility] OR;  Service: Orthopedics;  Laterality: Right;   TOTAL SHOULDER REPLACEMENT Right 1997   Allergies  Allergen Reactions   Penicillins Swelling   Ambien [Zolpidem Tartrate] Other (See Comments)    Causes hallucinations.    Codeine     hallucinations    Lisinopril Cough   Prior to Admission medications   Medication Sig Start Date End Date Taking? Authorizing Provider  aspirin EC 81 MG tablet Take 1 tablet (81 mg total) by mouth daily. Swallow whole. 05/14/22   Sharlene Dory, PA-C  atorvastatin (LIPITOR) 10 MG tablet Take 1 tablet (10 mg total) by mouth daily with breakfast. Patient taking differently: Take 10 mg by mouth in the morning, at noon, in the evening, and at bedtime. 11/25/21   Sharlene Dory, PA-C  azelastine (ASTELIN) 0.1 % nasal spray Place 1 spray into both nostrils as needed for rhinitis or allergies. Use in each nostril as directed    [provider]  cholecalciferol (VITAMIN D) 1000 UNITS tablet Take 1,000 Units by mouth daily.    [provider]  Coenzyme Q10 100 MG capsule Take 100 mg by mouth daily.    [provider]  fexofenadine (ALLEGRA) 180 MG tablet Take 180 mg by mouth daily.    [provider]  magnesium gluconate (MAGONATE) 500 MG tablet Take 500 mg by mouth 2 (two) times daily.     [provider]  metoprolol succinate (TOPROL-XL) 50 MG 24 hr  tablet Take 50 mg by mouth in the morning, at noon, in the evening, and at bedtime. Take with or immediately following a meal.    [provider]  metoprolol tartrate (LOPRESSOR) 25 MG tablet Take 1 tablet (25 mg total) by mouth 2 (two) times daily. Patient not taking: Reported on 11/26/2022 11/25/21   Sharlene Dory, PA-C  Multiple Vitamin (MULTIVITAMIN WITH MINERALS) TABS Take 1 tablet by mouth daily.    [provider]  nitroGLYCERIN (NITROSTAT) 0.4 MG SL tablet Place 1 tablet (0.4 mg total) under the tongue every 5 (five) minutes as needed for chest pain. 11/25/21   Sharlene Dory, PA-C  Omega-3 Fatty Acids (FISH OIL) 1200 MG CAPS Take 1 capsule by mouth daily at 12 noon.    [provider]  oxyCODONE (OXY IR/ROXICODONE) 5 MG immediate release tablet Take 1 tablet (5 mg total) by mouth every 4 (four) hours as needed for moderate pain ((score 4 to 6)).  Patient not taking: Reported on 11/26/2022 03/18/22   Arman Bogus, MD  telmisartan (MICARDIS) 80 MG tablet Take 1 tablet (80 mg total) by mouth every evening. 11/25/21   Sharlene Dory, PA-C  vitamin C (ASCORBIC ACID) 500 MG tablet Take 500 mg by mouth daily.     [provider]   Social History   Socioeconomic History   Marital status: Married    Spouse name: Not on file   Number of children: Not on file   Years of education: Not on file   Highest education level: Not on file  Occupational History   Not on file  Tobacco Use   Smoking status: Former    Current packs/day: 0.00    Average packs/day: 1 pack/day for 2.0 years (2.0 ttl pk-yrs)    Types: Cigarettes    Start date: 05/04/1959    Quit date: 05/03/1961    Years since quitting: 61.6   Smokeless tobacco: Never  Vaping Use   Vaping status: Never Used  Substance and Sexual Activity   Alcohol use: Yes    Alcohol/week: 1.0 standard drink of alcohol    Types: 1 Glasses of wine per week    Comment: occas glass of wine every 2 wks   Drug use: No    Sexual activity: Not Currently  Other Topics Concern   Not on file  Social History Narrative   Retired Charity fundraiser.   Lives with wife.   Social Determinants of Health   Financial Resource Strain: Not on file  Food Insecurity: Not on file  Transportation Needs: Not on file  Physical Activity: Not on file  Stress: Not on file  Social Connections: Not on file   Family History  Problem Relation Age of Onset   Heart disease Father        Died, 86   Hypertension Father    Thyroid disease Mother        Living, 90   Dementia Mother    Thyroid disease Brother    Healthy Daughter    Skin cancer Son     ROS: Currently denies lightheadedness, dizziness, Fever, chills, CP, SOB.   No personal history of DVT, PE, or CVA.Hx of MI. No loose teeth or dentures All other systems have been reviewed and were otherwise currently negative with the exception of those mentioned in the HPI and as above.  Objective: Vitals: Ht: 6\' 0"  Wt: 203.6 lbs Temp: 98.6 BP: 137/49 Pulse: 75 O2 95% on room air.   Physical Exam: General: Alert, NAD. Trendelenberg Gait  HEENT: EOMI, Good Neck Extension  Pulm: No increased work of breathing.  Clear B/L A/P w/o crackle or wheeze.  CV: RRR, No m/g/r appreciated  GI: soft, NT, ND Neuro: Neuro without gross focal deficit.  Sensation intact distally Skin: No lesions in the area of chief complaint MSK/Surgical Site: right Hip pain with passive ROM. 0-90 deg FF. Right leg shorter than left. EHL and FHL intact. 2+ PT pulse. Patient does endorses sensation, though significantly decreased since trials for cancer drugs.   Imaging Review Plain radiographs demonstrate severe degenerative joint disease of the right hip.   The bone quality appears to be adequate for age and reported activity level.  Preoperative templating of the joint replacement has been completed, documented, and submitted to the Operating Room personnel in order to optimize intra-operative equipment  management.  Assessment: right hip DJD Active Problems:   * No active hospital problems. *   Plan: Plan for  Procedure(s): TOTAL HIP ARTHROPLASTY  The patient history, physical exam, clinical judgement of the provider and imaging are consistent with end stage degenerative joint disease and total joint arthroplasty is deemed medically necessary. The treatment options including medical management, injection therapy, and arthroplasty were discussed at length. The risks and benefits of Procedure(s): TOTAL HIP ARTHROPLASTY were presented and reviewed.  The risks of nonoperative treatment, versus surgical intervention including but not limited to continued pain, aseptic loosening, stiffness, dislocation/subluxation, infection, bleeding, nerve injury, blood clots, cardiopulmonary complications, morbidity, mortality, among others were discussed. The patient verbalizes understanding and wishes to proceed with the plan.  Patient is being admitted for surgery, pain control, PT, prophylactic antibiotics, VTE prophylaxis, progressive ambulation, ADL's and discharge planning.    The patient does meet the criteria for TXA which will be used perioperatively.   Xarelto  will be used postoperatively for DVT prophylaxis in addition to SCDs, and early ambulation. The patient is planning to be discharged home with HHPT in care of family   Armida Sans, Cordelia Poche 11/29/2022 1:04 PM

## 2022-11-29 NOTE — Anesthesia Preprocedure Evaluation (Signed)
Anesthesia Evaluation  Patient identified by MRN, date of birth, ID band Patient awake    Reviewed: Allergy & Precautions, H&P , NPO status , Patient's Chart, lab work & pertinent test results  Airway Mallampati: II   Neck ROM: full    Dental   Pulmonary former smoker   breath sounds clear to auscultation       Cardiovascular hypertension, + CAD, + Past MI and + Cardiac Stents  + dysrhythmias Atrial Fibrillation  Rhythm:regular Rate:Normal     Neuro/Psych    GI/Hepatic ,GERD  ,,  Endo/Other  diabetes, Type 2    Renal/GU S/p nephrectomy     Musculoskeletal  (+) Arthritis ,    Abdominal   Peds  Hematology   Anesthesia Other Findings   Reproductive/Obstetrics                             Anesthesia Physical Anesthesia Plan  ASA: 3  Anesthesia Plan: MAC and Spinal   Post-op Pain Management:    Induction: Intravenous  PONV Risk Score and Plan: 1 and Propofol infusion and Treatment may vary due to age or medical condition  Airway Management Planned: Simple Face Mask  Additional Equipment:   Intra-op Plan:   Post-operative Plan:   Informed Consent: I have reviewed the patients History and Physical, chart, labs and discussed the procedure including the risks, benefits and alternatives for the proposed anesthesia with the patient or authorized representative who has indicated his/her understanding and acceptance.     Dental advisory given  Plan Discussed with: CRNA, Anesthesiologist and Surgeon  Anesthesia Plan Comments: (See PAT note 11/26/2022)       Anesthesia Quick Evaluation

## 2022-11-29 NOTE — Progress Notes (Signed)
Anesthesia Chart Review   Case: [id number] Date/Time: 11/30/22 0945   Procedure: TOTAL HIP ARTHROPLASTY (Right: Hip)   Anesthesia type: Spinal   Pre-op diagnosis: right hip DJD   Location: [facility] ROOM 08 / [facility]   Surgeons: Teryl Lucy, MD       DISCUSSION:82 y.o. former smoker with h/o HTN, PAF, CAD stent to RCA 1995, DM II, renal cell carcinoma s/p right nephrectomy, right hip djd scheduled for above procedure 11/30/2022 with Dr. Teryl Lucy.   Pt last seen by cardiology 07/29/2022. Pt stable at this visit with 1 year follow up scheduled.   Prior to this visit she was cleared by cardiology for upcoming procedure.  Per note, "hart reviewed as part of pre-operative protocol coverage. Patient was contacted 05/24/2022 in reference to pre-operative risk assessment for pending surgery as outlined below.  Donald Gray was last seen on 05/14/2022 by Jari Favre, PA.  Since that day, Donald Gray has done well from a cardiac standpoint.  He denies any new symptoms or concerns.  He is able to complete greater than 4 METS without difficulty.   Therefore, based on ACC/AHA guidelines, the patient would be at acceptable risk for the planned procedure without further cardiovascular testing.    Regarding ASA therapy, we recommend continuation of ASA throughout the perioperative period.  However, if the surgeon feels that cessation of ASA is required in the perioperative period, it may be stopped 5-7 days prior to surgery with a plan to resume it as soon as felt to be feasible from a surgical standpoint in the post-operative period."   VS: BP 125/69   Pulse 64   Temp 36.4 C (Oral)   Ht 5\' 11"  (1.803 m)   Wt 91.2 kg   SpO2 99%   BMI 28.03 kg/m   PROVIDERS: Emilio Aspen, MD is PCP   Corky Crafts, MD is Cardiologist LABS: Labs reviewed: Acceptable for surgery. (all labs ordered are listed, but only abnormal results are displayed)  Labs Reviewed  HEMOGLOBIN A1C -  Abnormal; Notable for the following components:      Result Value   Hgb A1c MFr Bld 6.4 (*)    All other components within normal limits  BASIC METABOLIC PANEL - Abnormal; Notable for the following components:   Glucose, Bld 152 (*)    All other components within normal limits  GLUCOSE, CAPILLARY - Abnormal; Notable for the following components:   Glucose-Capillary 135 (*)    All other components within normal limits  SURGICAL PCR SCREEN  CBC  TYPE AND SCREEN     IMAGES:   EKG:   CV: Echo 12/15/2021 1. Left ventricular ejection fraction, by estimation, is 55%. The left  ventricle has normal function. The left ventricle has no regional wall  motion abnormalities. The left ventricular internal cavity size was mildly  dilated. Left ventricular diastolic  parameters were normal.   2. Right ventricular systolic function is normal. The right ventricular  size is normal. There is normal pulmonary artery systolic pressure.   3. The mitral valve is abnormal. Mild mitral valve regurgitation. No  evidence of mitral stenosis.   4. The aortic valve is tricuspid. Aortic valve regurgitation is not  visualized. No aortic stenosis is present.   5. The inferior vena cava is normal in size with greater than 50%  respiratory variability, suggesting right atrial pressure of 3 mmHg.  Past Medical History:  Diagnosis Date   Anemia    Anticoagulated- to go  to Xarelto for a fib 10/07/2013   Arthritis    "fingers, toes" (10/05/2013)   Coronary artery disease    a. s/p MI with stent in 2000   Diverticulosis    Dysrhythmia    GERD (gastroesophageal reflux disease)    History of blood transfusion    "w/nephrectomy; w/shoulder replacement; w/knee replacement"   Hyperlipidemia    Hypertension    Left knee DJD    Myocardial infarction (HCC) 05/04/1995   Neuropathy    feet bilat    NSTEMI (non-ST elevated myocardial infarction), type 2 due to demand ischemia 10/07/2013   PAF (paroxysmal atrial  fibrillation) (HCC)    Pneumonia ~ 2007   again 3 months ago    Primary localized osteoarthritis of right knee    Renal cell carcinoma (HCC)    a. s/p right nephrectomy 08/14/09   Shortness of breath    a. related to chemo drugs   Tendonitis    currently on prednisone and wearing brace (left wrist)   Type II diabetes mellitus (HCC)    "treating w/diet and weight loss"    Past Surgical History:  Procedure Laterality Date   CARDIAC CATHETERIZATION  05/2009   CARDIOVERSION N/A 10/08/2013   Procedure: CARDIOVERSION;  Surgeon: Lars Masson, MD;  Location: [facility] ENDOSCOPY;  Service: Cardiovascular;  Laterality: N/A;   CHEILECTOMY Right 04/13/2013   Procedure: Right Great Toe Cheilectomy, Debride Tendon and Abscess, Apply Stimulan Beads;  Surgeon: Nadara Mustard, MD;  Location: [facility];  Service: Orthopedics;  Laterality: Right;  Right Great Toe Cheilectomy, Debride Tendon and Abscess, Apply Stimulan Beads   colonscopy      CORONARY ANGIOPLASTY WITH STENT PLACEMENT  1997   at [facility] to RCA    INGUINAL HERNIA REPAIR  09/22/2011   Procedure: HERNIA REPAIR INGUINAL ADULT BILATERAL;  Surgeon: Wilmon Arms. Corliss Skains, MD;  Location: [facility];  Service: General;  Laterality: Bilateral;   INGUINAL HERNIA REPAIR Bilateral 09/22/11   JOINT REPLACEMENT     KNEE ARTHROSCOPY Right ~ 2011   KNEE ARTHROSCOPY Left 11/13/2012   Procedure: ARTHROSCOPY KNEE;  Surgeon: Nilda Simmer, MD;  Location: [facility] OR;  Service: Orthopedics;  Laterality: Left;   LUMBAR LAMINECTOMY/DECOMPRESSION MICRODISCECTOMY N/A 03/17/2022   Procedure: Laminectomy and Foraminotomy - Lumbar two-three, Lumbar three-four, Lumbar four-five;  Surgeon: Tia Alert, MD;  Location: [facility] OR;  Service: Neurosurgery;  Laterality: N/A;   LUNG BIOPSY  2011   MASS EXCISION Right 04/12/2014   Procedure: MASS EXCISION DORSUM OF RIGHT FOOT  ;  Surgeon: Jacki Cones, MD;  Location[facility];  Service: Orthopedics;  Laterality: Right;   NEPHRECTOMY Right 08/14/2009    ORCHIECTOMY  09/22/2011   Procedure: ORCHIECTOMY;  Surgeon: Marcine Matar, MD;  Location[facility];  Service: Urology;  Laterality: Left;  Left Testicular Exploration, Inguinal Approach,  Testicular    PILONIDAL CYST DRAINAGE     TEE WITHOUT CARDIOVERSION N/A 10/08/2013   Procedure: TRANSESOPHAGEAL ECHOCARDIOGRAM (TEE);  Surgeon: Lars Masson, MD;  Location: [facility] ENDOSCOPY;  Service: Cardiovascular;  Laterality: N/A;   TONSILLECTOMY AND ADENOIDECTOMY  1948   TOTAL KNEE ARTHROPLASTY Left 11/13/2012   Procedure: TOTAL KNEE ARTHROPLASTY;  Surgeon: Nilda Simmer, MD;  Location: [facility];  Service: Orthopedics;  Laterality: Left;   TOTAL KNEE ARTHROPLASTY Right 07/22/2014   Procedure: RIGHT TOTAL KNEE ARTHROPLASTY;  Surgeon: Salvatore Marvel, MD;  Location: [facility] OR;  Service: Orthopedics;  Laterality: Right;   TOTAL SHOULDER REPLACEMENT Right 1997  MEDICATIONS:  aspirin EC 81 MG tablet   atorvastatin (LIPITOR) 10 MG tablet   azelastine (ASTELIN) 0.1 % nasal spray   cholecalciferol (VITAMIN D) 1000 UNITS tablet   Coenzyme Q10 100 MG capsule   fexofenadine (ALLEGRA) 180 MG tablet   magnesium gluconate (MAGONATE) 500 MG tablet   metoprolol succinate (TOPROL-XL) 50 MG 24 hr tablet   Multiple Vitamin (MULTIVITAMIN WITH MINERALS) TABS   nitroGLYCERIN (NITROSTAT) 0.4 MG SL tablet   Omega-3 Fatty Acids (FISH OIL) 1200 MG CAPS   telmisartan (MICARDIS) 80 MG tablet   vitamin C (ASCORBIC ACID) 500 MG tablet   metoprolol tartrate (LOPRESSOR) 25 MG tablet   oxyCODONE (OXY IR/ROXICODONE) 5 MG immediate release tablet   No current facility-administered medications for this encounter.    Jodell Cipro Ward, PA-C [facility] [phone]

## 2022-11-30 ENCOUNTER — Other Ambulatory Visit: Payer: Self-pay

## 2022-11-30 ENCOUNTER — Observation Stay (HOSPITAL_COMMUNITY): Payer: Medicare HMO

## 2022-11-30 ENCOUNTER — Ambulatory Visit (HOSPITAL_COMMUNITY): Payer: Medicare HMO | Admitting: Physician Assistant

## 2022-11-30 ENCOUNTER — Ambulatory Visit (HOSPITAL_BASED_OUTPATIENT_CLINIC_OR_DEPARTMENT_OTHER): Payer: Medicare HMO | Admitting: Anesthesiology

## 2022-11-30 ENCOUNTER — Observation Stay (HOSPITAL_COMMUNITY)
Admission: RE | Admit: 2022-11-30 | Discharge: 2022-12-01 | Disposition: A | Discharge status: Home Health | Payer: Medicare HMO | Attending: Orthopedic Surgery | Admitting: Orthopedic Surgery

## 2022-11-30 ENCOUNTER — Encounter (HOSPITAL_COMMUNITY): Payer: Self-pay | Admitting: Orthopedic Surgery

## 2022-11-30 ENCOUNTER — Encounter (HOSPITAL_COMMUNITY)
Admission: RE | Disposition: A | Discharge status: Home Health | Payer: Self-pay | Source: Home / Self Care | Attending: Orthopedic Surgery

## 2022-11-30 DIAGNOSIS — I5032 Chronic diastolic (congestive) heart failure: Secondary | ICD-10-CM

## 2022-11-30 DIAGNOSIS — Z96641 Presence of right artificial hip joint: Secondary | ICD-10-CM | POA: Diagnosis not present

## 2022-11-30 DIAGNOSIS — Z85528 Personal history of other malignant neoplasm of kidney: Secondary | ICD-10-CM | POA: Diagnosis not present

## 2022-11-30 DIAGNOSIS — Z87891 Personal history of nicotine dependence: Secondary | ICD-10-CM | POA: Insufficient documentation

## 2022-11-30 DIAGNOSIS — I480 Paroxysmal atrial fibrillation: Secondary | ICD-10-CM | POA: Insufficient documentation

## 2022-11-30 DIAGNOSIS — Z7901 Long term (current) use of anticoagulants: Secondary | ICD-10-CM | POA: Diagnosis not present

## 2022-11-30 DIAGNOSIS — Z7982 Long term (current) use of aspirin: Secondary | ICD-10-CM | POA: Insufficient documentation

## 2022-11-30 DIAGNOSIS — Z96611 Presence of right artificial shoulder joint: Secondary | ICD-10-CM | POA: Insufficient documentation

## 2022-11-30 DIAGNOSIS — Z96653 Presence of artificial knee joint, bilateral: Secondary | ICD-10-CM | POA: Diagnosis not present

## 2022-11-30 DIAGNOSIS — I10 Essential (primary) hypertension: Secondary | ICD-10-CM | POA: Diagnosis not present

## 2022-11-30 DIAGNOSIS — I110 Hypertensive heart disease with heart failure: Secondary | ICD-10-CM | POA: Diagnosis not present

## 2022-11-30 DIAGNOSIS — E119 Type 2 diabetes mellitus without complications: Secondary | ICD-10-CM | POA: Insufficient documentation

## 2022-11-30 DIAGNOSIS — M1611 Unilateral primary osteoarthritis, right hip: Principal | ICD-10-CM | POA: Insufficient documentation

## 2022-11-30 DIAGNOSIS — Z79899 Other long term (current) drug therapy: Secondary | ICD-10-CM | POA: Insufficient documentation

## 2022-11-30 DIAGNOSIS — I2510 Atherosclerotic heart disease of native coronary artery without angina pectoris: Secondary | ICD-10-CM | POA: Diagnosis not present

## 2022-11-30 DIAGNOSIS — Z955 Presence of coronary angioplasty implant and graft: Secondary | ICD-10-CM | POA: Diagnosis not present

## 2022-11-30 DIAGNOSIS — Z471 Aftercare following joint replacement surgery: Secondary | ICD-10-CM | POA: Diagnosis not present

## 2022-11-30 HISTORY — PX: TOTAL HIP ARTHROPLASTY: SHX124

## 2022-11-30 LAB — GLUCOSE, CAPILLARY
Glucose-Capillary: 126 mg/dL — ABNORMAL HIGH (ref 70–99)
Glucose-Capillary: 120 mg/dL — ABNORMAL HIGH (ref 70–99)

## 2022-11-30 SURGERY — ARTHROPLASTY, HIP, TOTAL,POSTERIOR APPROACH
Anesthesia: Monitor Anesthesia Care | Site: Hip | Laterality: Right

## 2022-11-30 MED ORDER — ONDANSETRON HCL 4 MG/2ML IJ SOLN: 4.0000 mg | Freq: Four times a day (QID) | INTRAMUSCULAR | Status: DC | PRN

## 2022-11-30 MED ORDER — 0.9 % SODIUM CHLORIDE (POUR BTL) OPTIME
TOPICAL | Status: DC | PRN
  Administered 2022-11-30: 1000 mL

## 2022-11-30 MED ORDER — PROPOFOL 500 MG/50ML IV EMUL
INTRAVENOUS | Status: DC | PRN
Start: 2022-11-30 — End: 2022-11-30
  Administered 2022-11-30: 100 ug/kg/min via INTRAVENOUS

## 2022-11-30 MED ORDER — TRANEXAMIC ACID-NACL 1000-0.7 MG/100ML-% IV SOLN
1000.0000 mg | INTRAVENOUS | Status: AC
  Administered 2022-11-30: 1000 mg via INTRAVENOUS
  Filled 2022-11-30: qty 100

## 2022-11-30 MED ORDER — PROPOFOL 1000 MG/100ML IV EMUL
INTRAVENOUS | Status: AC
  Filled 2022-11-30: qty 300

## 2022-11-30 MED ORDER — KETOROLAC TROMETHAMINE 30 MG/ML IJ SOLN
INTRAMUSCULAR | Status: AC
  Filled 2022-11-30: qty 1

## 2022-11-30 MED ORDER — PHENOL 1.4 % MT LIQD: 1.0000 | OROMUCOSAL | Status: DC | PRN

## 2022-11-30 MED ORDER — OXYCODONE HCL 5 MG PO TABS
5.0000 mg | ORAL_TABLET | ORAL | Status: DC | PRN
  Administered 2022-11-30 – 2022-12-01 (×4): 5 mg via ORAL
  Filled 2022-11-30 (×4): qty 1

## 2022-11-30 MED ORDER — FENTANYL CITRATE PF 50 MCG/ML IJ SOSY: 25.0000 ug | PREFILLED_SYRINGE | INTRAMUSCULAR | Status: DC | PRN

## 2022-11-30 MED ORDER — PROPOFOL 10 MG/ML IV BOLUS
INTRAVENOUS | Status: AC
  Filled 2022-11-30: qty 20

## 2022-11-30 MED ORDER — POLYETHYLENE GLYCOL 3350 17 G PO PACK: 17.0000 g | PACK | Freq: Every day | ORAL | Status: DC | PRN

## 2022-11-30 MED ORDER — PHENYLEPHRINE HCL (PRESSORS) 10 MG/ML IV SOLN
INTRAVENOUS | Status: AC
  Filled 2022-11-30: qty 1

## 2022-11-30 MED ORDER — CHLORHEXIDINE GLUCONATE 0.12 % MT SOLN
15.0000 mL | Freq: Once | OROMUCOSAL | Status: AC
  Administered 2022-11-30: 15 mL via OROMUCOSAL

## 2022-11-30 MED ORDER — NITROGLYCERIN 0.4 MG SL SUBL: 0.4000 mg | SUBLINGUAL_TABLET | SUBLINGUAL | Status: DC | PRN

## 2022-11-30 MED ORDER — LACTATED RINGERS IV SOLN: INTRAVENOUS | Status: DC

## 2022-11-30 MED ORDER — OXYCODONE HCL 5 MG PO TABS
ORAL_TABLET | ORAL | Status: AC
  Filled 2022-11-30: qty 1

## 2022-11-30 MED ORDER — ASPIRIN 325 MG PO TBEC
325.0000 mg | DELAYED_RELEASE_TABLET | Freq: Every day | ORAL | Status: DC
  Administered 2022-12-01: 325 mg via ORAL
  Filled 2022-11-30: qty 1

## 2022-11-30 MED ORDER — ORAL CARE MOUTH RINSE: 15.0000 mL | Freq: Once | OROMUCOSAL | Status: AC

## 2022-11-30 MED ORDER — INSULIN ASPART 100 UNIT/ML IJ SOLN: 0.0000 [IU] | INTRAMUSCULAR | Status: DC | PRN

## 2022-11-30 MED ORDER — METOPROLOL SUCCINATE ER 50 MG PO TB24: 50.0000 mg | ORAL_TABLET | Freq: Four times a day (QID) | ORAL | Status: DC

## 2022-11-30 MED ORDER — OXYCODONE HCL 5 MG/5ML PO SOLN: 5.0000 mg | Freq: Once | ORAL | Status: AC | PRN

## 2022-11-30 MED ORDER — POVIDONE-IODINE 10 % EX SWAB: 2.0000 | Freq: Once | CUTANEOUS | Status: DC

## 2022-11-30 MED ORDER — CEFAZOLIN SODIUM-DEXTROSE 2-4 GM/100ML-% IV SOLN
2.0000 g | Freq: Four times a day (QID) | INTRAVENOUS | Status: AC
  Administered 2022-11-30 (×2): 2 g via INTRAVENOUS
  Filled 2022-11-30 (×2): qty 100

## 2022-11-30 MED ORDER — ACETAMINOPHEN 500 MG PO TABS
1000.0000 mg | ORAL_TABLET | Freq: Four times a day (QID) | ORAL | Status: AC
  Administered 2022-11-30 – 2022-12-01 (×4): 1000 mg via ORAL
  Filled 2022-11-30 (×4): qty 2

## 2022-11-30 MED ORDER — CEFAZOLIN SODIUM-DEXTROSE 2-4 GM/100ML-% IV SOLN
2.0000 g | INTRAVENOUS | Status: AC
  Administered 2022-11-30: 2 g via INTRAVENOUS
  Filled 2022-11-30: qty 100

## 2022-11-30 MED ORDER — OXYCODONE HCL 5 MG PO TABS: 5.0000 mg | ORAL_TABLET | Freq: Once | ORAL | Status: DC | PRN

## 2022-11-30 MED ORDER — OXYCODONE HCL 5 MG PO TABS
5.0000 mg | ORAL_TABLET | Freq: Once | ORAL | Status: AC | PRN
  Administered 2022-11-30: 5 mg via ORAL

## 2022-11-30 MED ORDER — EPHEDRINE SULFATE (PRESSORS) 50 MG/ML IJ SOLN
INTRAMUSCULAR | Status: DC | PRN
  Administered 2022-11-30: 5 mg via INTRAVENOUS
  Administered 2022-11-30: 10 mg via INTRAVENOUS
  Administered 2022-11-30 (×2): 5 mg via INTRAVENOUS

## 2022-11-30 MED ORDER — BUPIVACAINE HCL (PF) 0.25 % IJ SOLN
INTRAMUSCULAR | Status: AC
  Filled 2022-11-30: qty 30

## 2022-11-30 MED ORDER — DOCUSATE SODIUM 100 MG PO CAPS
100.0000 mg | ORAL_CAPSULE | Freq: Two times a day (BID) | ORAL | Status: DC
  Administered 2022-11-30 – 2022-12-01 (×2): 100 mg via ORAL
  Filled 2022-11-30 (×2): qty 1

## 2022-11-30 MED ORDER — STERILE WATER FOR IRRIGATION IR SOLN
Status: DC | PRN
  Administered 2022-11-30: 2000 mL

## 2022-11-30 MED ORDER — BISACODYL 10 MG RE SUPP: 10.0000 mg | Freq: Every day | RECTAL | Status: DC | PRN

## 2022-11-30 MED ORDER — TRANEXAMIC ACID-NACL 1000-0.7 MG/100ML-% IV SOLN
1000.0000 mg | Freq: Once | INTRAVENOUS | Status: AC
  Administered 2022-11-30: 1000 mg via INTRAVENOUS
  Filled 2022-11-30: qty 100

## 2022-11-30 MED ORDER — DEXAMETHASONE SODIUM PHOSPHATE 4 MG/ML IJ SOLN
INTRAMUSCULAR | Status: DC | PRN
  Administered 2022-11-30: 8 mg via INTRAVENOUS

## 2022-11-30 MED ORDER — ONDANSETRON HCL 4 MG PO TABS: 4.0000 mg | ORAL_TABLET | Freq: Four times a day (QID) | ORAL | Status: DC | PRN

## 2022-11-30 MED ORDER — PHENYLEPHRINE HCL-NACL 20-0.9 MG/250ML-% IV SOLN
INTRAVENOUS | Status: DC | PRN
  Administered 2022-11-30: 30 ug/min via INTRAVENOUS

## 2022-11-30 MED ORDER — TRAMADOL HCL 50 MG PO TABS
50.0000 mg | ORAL_TABLET | ORAL | Status: DC | PRN
  Administered 2022-12-01: 50 mg via ORAL
  Filled 2022-11-30: qty 1

## 2022-11-30 MED ORDER — PHENYLEPHRINE 80 MCG/ML (10ML) SYRINGE FOR IV PUSH (FOR BLOOD PRESSURE SUPPORT)
PREFILLED_SYRINGE | INTRAVENOUS | Status: AC
  Filled 2022-11-30: qty 20

## 2022-11-30 MED ORDER — ACETAMINOPHEN 325 MG PO TABS: 325.0000 mg | ORAL_TABLET | Freq: Four times a day (QID) | ORAL | Status: DC | PRN

## 2022-11-30 MED ORDER — PHENYLEPHRINE HCL (PRESSORS) 10 MG/ML IV SOLN
INTRAVENOUS | Status: DC | PRN
  Administered 2022-11-30 (×5): 160 ug via INTRAVENOUS

## 2022-11-30 MED ORDER — HYDROMORPHONE HCL 1 MG/ML IJ SOLN: 0.5000 mg | INTRAMUSCULAR | Status: DC | PRN

## 2022-11-30 MED ORDER — BUPIVACAINE HCL (PF) 0.25 % IJ SOLN
INTRAMUSCULAR | Status: DC | PRN
  Administered 2022-11-30: 30 mL

## 2022-11-30 MED ORDER — ONDANSETRON HCL 4 MG/2ML IJ SOLN
INTRAMUSCULAR | Status: DC | PRN
  Administered 2022-11-30: 4 mg via INTRAVENOUS

## 2022-11-30 MED ORDER — IRBESARTAN 150 MG PO TABS: 300.0000 mg | ORAL_TABLET | Freq: Every day | ORAL | Status: DC

## 2022-11-30 MED ORDER — MAGNESIUM CITRATE PO SOLN: 1.0000 | Freq: Once | ORAL | Status: DC | PRN

## 2022-11-30 MED ORDER — OXYCODONE HCL 5 MG/5ML PO SOLN: 5.0000 mg | Freq: Once | ORAL | Status: DC | PRN

## 2022-11-30 MED ORDER — KETOROLAC TROMETHAMINE 30 MG/ML IJ SOLN
INTRAMUSCULAR | Status: DC | PRN
  Administered 2022-11-30: 30 mg

## 2022-11-30 MED ORDER — MENTHOL 3 MG MT LOZG: 1.0000 | LOZENGE | OROMUCOSAL | Status: DC | PRN

## 2022-11-30 MED ORDER — PROPOFOL 500 MG/50ML IV EMUL
INTRAVENOUS | Status: AC
  Filled 2022-11-30: qty 50

## 2022-11-30 MED ORDER — FENTANYL CITRATE (PF) 100 MCG/2ML IJ SOLN
INTRAMUSCULAR | Status: AC
  Filled 2022-11-30: qty 2

## 2022-11-30 MED ORDER — ACETAMINOPHEN 500 MG PO TABS
1000.0000 mg | ORAL_TABLET | Freq: Once | ORAL | Status: AC
  Administered 2022-11-30: 1000 mg via ORAL
  Filled 2022-11-30: qty 2

## 2022-11-30 MED ORDER — EPHEDRINE 5 MG/ML INJ
INTRAVENOUS | Status: AC
  Filled 2022-11-30: qty 5

## 2022-11-30 MED ORDER — BUPIVACAINE IN DEXTROSE 0.75-8.25 % IT SOLN
INTRATHECAL | Status: DC | PRN
  Administered 2022-11-30: 1.8 mL via INTRATHECAL

## 2022-11-30 MED ORDER — POTASSIUM CHLORIDE IN NACL 20-0.9 MEQ/L-% IV SOLN
INTRAVENOUS | Status: DC
  Filled 2022-11-30 (×2): qty 1000

## 2022-11-30 MED ORDER — ATORVASTATIN CALCIUM 10 MG PO TABS
10.0000 mg | ORAL_TABLET | Freq: Every day | ORAL | Status: DC
  Administered 2022-12-01: 10 mg via ORAL
  Filled 2022-11-30: qty 1

## 2022-11-30 MED ORDER — METOCLOPRAMIDE HCL 5 MG PO TABS: 5.0000 mg | ORAL_TABLET | Freq: Three times a day (TID) | ORAL | Status: DC | PRN

## 2022-11-30 MED ORDER — METOPROLOL TARTRATE 25 MG PO TABS
25.0000 mg | ORAL_TABLET | Freq: Two times a day (BID) | ORAL | Status: DC
  Administered 2022-11-30 – 2022-12-01 (×2): 25 mg via ORAL
  Filled 2022-11-30 (×2): qty 1

## 2022-11-30 MED ORDER — ALUM & MAG HYDROXIDE-SIMETH 200-200-20 MG/5ML PO SUSP: 30.0000 mL | ORAL | Status: DC | PRN

## 2022-11-30 MED ORDER — DEXAMETHASONE SODIUM PHOSPHATE 10 MG/ML IJ SOLN
10.0000 mg | Freq: Once | INTRAMUSCULAR | Status: AC
  Administered 2022-12-01: 10 mg via INTRAVENOUS
  Filled 2022-11-30: qty 1

## 2022-11-30 MED ORDER — FENTANYL CITRATE (PF) 100 MCG/2ML IJ SOLN
INTRAMUSCULAR | Status: DC | PRN
  Administered 2022-11-30 (×2): 50 ug via INTRAVENOUS

## 2022-11-30 MED ORDER — METOCLOPRAMIDE HCL 5 MG/ML IJ SOLN: 5.0000 mg | Freq: Three times a day (TID) | INTRAMUSCULAR | Status: DC | PRN

## 2022-11-30 MED ORDER — DIPHENHYDRAMINE HCL 12.5 MG/5ML PO ELIX
12.5000 mg | ORAL_SOLUTION | ORAL | Status: DC | PRN
  Administered 2022-12-01: 25 mg via ORAL
  Filled 2022-11-30: qty 10

## 2022-11-30 SURGICAL SUPPLY — 53 items
BAG COUNTER SPONGE SURGICOUNT (BAG) IMPLANT
BAG SPNG CNTER NS LX DISP (BAG)
BIT DRILL 2.0X128 (BIT) ×1 IMPLANT
BLADE SAW SGTL 73X25 THK (BLADE) ×1 IMPLANT
CLSR STERI-STRIP ANTIMIC 1/2X4 (GAUZE/BANDAGES/DRESSINGS) ×2 IMPLANT
COVER SURGICAL LIGHT HANDLE (MISCELLANEOUS) ×1 IMPLANT
DRAPE INCISE IOBAN 66X45 STRL (DRAPES) ×1 IMPLANT
DRAPE ORTHO SPLIT 77X108 STRL (DRAPES) ×2
DRAPE POUCH INSTRU U-SHP 10X18 (DRAPES) ×1 IMPLANT
DRAPE SHEET LG 3/4 BI-LAMINATE (DRAPES) ×1 IMPLANT
DRAPE SURG 17X11 SM STRL (DRAPES) ×1 IMPLANT
DRAPE SURG ORHT 6 SPLT 77X108 (DRAPES) ×2 IMPLANT
DRAPE U-SHAPE 47X51 STRL (DRAPES) ×1 IMPLANT
DRSG MEPILEX POST OP 4X12 (GAUZE/BANDAGES/DRESSINGS) IMPLANT
DRSG MEPILEX POST OP 4X8 (GAUZE/BANDAGES/DRESSINGS) ×1 IMPLANT
DURAPREP 26ML APPLICATOR (WOUND CARE) ×2 IMPLANT
ELECT BLADE TIP CTD 4 INCH (ELECTRODE) ×1 IMPLANT
ELECT REM PT RETURN 15FT ADLT (MISCELLANEOUS) ×1 IMPLANT
ELIMINATOR HOLE APEX DEPUY (Hips) IMPLANT
FACESHIELD WRAPAROUND (MASK) ×2
FACESHIELD WRAPAROUND OR TEAM (MASK) ×2 IMPLANT
GLOVE BIO SURGEON STRL SZ 6.5 (GLOVE) ×1 IMPLANT
GLOVE BIO SURGEON STRL SZ7.5 (GLOVE) ×1 IMPLANT
GLOVE BIOGEL PI IND STRL 7.0 (GLOVE) ×1 IMPLANT
GLOVE BIOGEL PI IND STRL 8 (GLOVE) ×1 IMPLANT
GOWN STRL SURGICAL XL XLNG (GOWN DISPOSABLE) ×2 IMPLANT
HEAD M SROM 36MM PLUS 1.5 (Hips) IMPLANT
HOOD PEEL AWAY T7 (MISCELLANEOUS) ×3 IMPLANT
KIT BASIN OR (CUSTOM PROCEDURE TRAY) ×1 IMPLANT
KIT TURNOVER KIT A (KITS) IMPLANT
LINER NEUTRAL 52X36MM PLUS 4 (Liner) IMPLANT
MANIFOLD NEPTUNE II (INSTRUMENTS) ×1 IMPLANT
NDL SAFETY ECLIP 18X1.5 (MISCELLANEOUS) ×2 IMPLANT
NEEDLE ANCHOR KEITH 2 7/8 STR (NEEDLE) ×1 IMPLANT
NS IRRIG 1000ML POUR BTL (IV SOLUTION) ×1 IMPLANT
PACK TOTAL JOINT (CUSTOM PROCEDURE TRAY) ×1 IMPLANT
PIN SECTOR W/GRIP ACE CUP 52MM (Hips) IMPLANT
PROTECTOR NERVE ULNAR (MISCELLANEOUS) ×1 IMPLANT
SCREW 6.5MMX25MM (Screw) IMPLANT
SROM M HEAD 36MM PLUS 1.5 (Hips) ×1 IMPLANT
SUCTION TUBE FRAZIER 12FR DISP (SUCTIONS) ×1 IMPLANT
SUT ETHIBOND NAB CT1 #1 30IN (SUTURE) ×3 IMPLANT
SUT VIC AB 1 CT1 36 (SUTURE) ×3 IMPLANT
SUT VIC AB 2-0 CT1 27 (SUTURE) ×2
SUT VIC AB 2-0 CT1 TAPERPNT 27 (SUTURE) ×2 IMPLANT
SUT VIC AB 3-0 SH 27 (SUTURE) ×2
SUT VIC AB 3-0 SH 27X BRD (SUTURE) ×2 IMPLANT
SYR CONTROL 10ML LL (SYRINGE) ×2 IMPLANT
TAP DUOFIX SZ5 STD OFF (Hips) IMPLANT
TOWEL OR 17X26 10 PK STRL BLUE (TOWEL DISPOSABLE) ×1 IMPLANT
TRAY FOLEY MTR SLVR 16FR STAT (SET/KITS/TRAYS/PACK) ×1 IMPLANT
TUBE SUCTION HIGH CAP CLEAR NV (SUCTIONS) ×1 IMPLANT
WATER STERILE IRR 1000ML POUR (IV SOLUTION) ×2 IMPLANT

## 2022-11-30 NOTE — Plan of Care (Signed)

## 2022-11-30 NOTE — Interval H&P Note (Signed)
History and Physical Interval Note:  11/30/2022 9:00 AM  Donald Gray  has presented today for surgery, with the diagnosis of right hip DJD.  The various methods of treatment have been discussed with the patient and family. After consideration of risks, benefits and other options for treatment, the patient has consented to  Procedure(s): TOTAL HIP ARTHROPLASTY (Right) as a surgical intervention.  The patient's history has been reviewed, patient examined, no change in status, stable for surgery.  I have reviewed the patient's chart and labs.  Questions were answered to the patient's satisfaction.     Eulas Post

## 2022-11-30 NOTE — Transfer of Care (Signed)
Immediate Anesthesia Transfer of Care Note  Patient: Donald Gray  Procedure(s) Performed: RIGHT TOTAL HIP ARTHROPLASTY (Right: Hip)  Patient Location: PACU  Anesthesia Type:Spinal  Level of Consciousness: awake, alert , oriented, and patient cooperative  Airway & Oxygen Therapy: spontaneous breathing, facemask O2  Post-op Assessment: Report given to RN and Post -op Vital signs reviewed and stable  Post vital signs: Reviewed and stable  Last Vitals:  Vitals Value Taken Time  BP    Temp    Pulse    Resp    SpO2      Last Pain:  Vitals:   11/30/22 0743  TempSrc:   PainSc: 0-No pain         Complications: No notable events documented.

## 2022-11-30 NOTE — Op Note (Signed)
11/30/2022  11:18 AM  PATIENT:  Donald Gray   MRN: [id number]  PRE-OPERATIVE DIAGNOSIS:  right hip primary localized osteoarthritis  POST-OPERATIVE DIAGNOSIS:  same  PROCEDURE:  Procedure(s): RIGHT TOTAL HIP ARTHROPLASTY  PREOPERATIVE INDICATIONS:    Donald Gray is an 82 y.o. male who has a diagnosis of right hip osteoarthritis and elected for surgical management after failing conservative treatment.  The risks benefits and alternatives were discussed with the patient including but not limited to the risks of nonoperative treatment, versus surgical intervention including infection, bleeding, nerve injury, periprosthetic fracture, the need for revision surgery, dislocation, leg length discrepancy, blood clots, cardiopulmonary complications, morbidity, mortality, among others, and they were willing to proceed.     OPERATIVE REPORT     SURGEON:  Teryl Lucy, MD    ASSISTANT:  Janine Ores, PA-C, (Present throughout the entire procedure,  necessary for completion of procedure in a timely manner, assisting with retraction, instrumentation, and closure)     ANESTHESIA: Spinal  ESTIMATED BLOOD LOSS: 200 mL    COMPLICATIONS:  None.     UNIQUE ASPECTS OF THE CASE: He had a posterior inferior osteophyte on the acetabulum, and the acetabulum had more depth although I did not necessarily maximize the depth, and the cup was matching anteriorly and posteriorly, although overhanging from the native acetabulum because I did not fully medialized.  The hip was extremely stable, at first I debated whether or not I was leaving a little bit short, but I felt like the +5 was a little bit too long and I had excellent posterior stability already.  The soft tissue closure was excellent.  Implant Name Type Inv. Item Serial No. Manufacturer Lot No. LRB No. Used Action  ELIMINATOR HOLE APEX DEPUY - [id number] Hips ELIMINATOR HOLE APEX DEPUY  [employer]PU[employer] [id number] Right 1 Implanted  PIN  SECTOR W/GRIP ACE CUP - [id number] Hips PIN SECTOR W/GRIP ACE CUP  [employer]PU[employer] [id number] Right 1 Implanted  SCREW 6.5MMX25MM - [id number] Screw SCREW 6.5MMX25MM  DEPU[employer]TH[employer] [id number] Right 1 Implanted  LINER NEUTRAL 52X36MM PLUS 4 - [id number] Liner LINER NEUTRAL 52X36MM PLUS 4  DEPUY ORTHOPA[employer] [id number] Right 1 Implanted  TAP DUOFIX SZ5 STD OFF - [id number] Hips TAP DUOFIX SZ5 STD OFF  DEPU[employer]TH[employer] [id number] Right 1 Implanted  SROM M HEAD PLUS 1.5 - [id number] Hips SROM M HEAD PLUS 1.5  DEPUY ORTH[employer] [id number] Right 1 Implanted       PROCEDURE IN DETAIL:   The patient was met in the holding area and  identified.  The appropriate hip was identified and marked at the operative site.  The patient was then transported to the OR  and  placed under anesthesia.  At that point, the patient was  placed in the lateral decubitus position with the operative side up and  secured to the operating room table and all bony prominences padded.     The operative lower extremity was prepped from the iliac crest to the distal leg.  Sterile draping was performed.  Time out was performed prior to incision.      A routine posterolateral approach was utilized via sharp dissection  carried down to the subcutaneous tissue.  Gross bleeders were Bovie coagulated.  The iliotibial band was identified and incised along the length of the skin incision.  Self-retaining retractors were  inserted.  With the hip internally rotated, the short external rotators  were identified. The piriformis and capsule was tagged with FiberWire,  and the hip capsule released in a T-type fashion.  The femoral neck was exposed, and I resected the femoral neck using the appropriate jig. This was performed at approximately a thumb's breadth above the lesser trochanter.    I then exposed the deep acetabulum, cleared out any tissue including the ligamentum teres.  A wing retractor was placed.  After adequate  visualization, I excised the labrum, and then sequentially reamed.  I placed the trial acetabulum, which seated nicely, and then impacted the real cup into place.  Appropriate version and inclination was confirmed clinically matching their bony anatomy, and also with the use of the jig.  I placed a cancellous screw to augment fixation.  A trial polyethylene liner was placed and the wing retractor removed.    I then prepared the proximal femur using the cookie-cutter, the lateralizing reamer, and then sequentially reamed and broached.  A trial broach, neck, and head was utilized, and I reduced the hip and it was found to have excellent stability with functional range of motion. The trial components were then removed, and the real polyethylene liner was placed.  I then impacted the real femoral prosthesis into place into the appropriate version, slightly anteverted to the normal anatomy, and I impacted the real head ball into place. The hip was then reduced and taken through functional range of motion and found to have excellent stability. Leg lengths were restored.  I then used a 2 mm drill bits to pass the FiberWire suture from the capsule and piriformis through the greater trochanter, and secured this. Excellent posterior capsular repair was achieved. I also closed the T in the capsule.  I then irrigated the hip copiously again with pulse lavage, and repaired the fascia with Vicryl, followed by Vicryl for the subcutaneous tissue, Monocryl for the skin, Steri-Strips and sterile gauze. The wounds were injected. The patient was then awakened and returned to PACU in stable and satisfactory condition. There were no complications.  Teryl Lucy, MD Orthopedic Surgeon [phone]   11/30/2022 11:18 AM

## 2022-11-30 NOTE — Evaluation (Signed)
Physical Therapy Evaluation Patient Details Name: Donald Gray MRN: [id number] DOB: May 03, 1941 Today's Date: 11/30/2022  History of Present Illness  Pt is 82 yo male s/p R posterior THA on 11/30/22.  Pt with hx including but not limited to afib, arthritis, HTN, MI, bil feet neuropathy, NSTEMI, Renal cell CA s/p R nephrectomy, DM2, Bil TKA, lumbar decompression, and R TSA.  Clinical Impression  Pt is s/p THA resulting in the deficits listed below (see PT Problem List). At baseline, pt is independent.  He has support at home but does have to go up a flight of stairs.  Today, pt very motivated and pleasant.  He has good pain control and tolerated therapy well. Pt requiring min A for transfers and ambulate 66' with RW.  Pt expected to progress well with therapy.  Pt will benefit from acute skilled PT to increase their independence and safety with mobility to facilitate discharge.          If plan is discharge home, recommend the following: A little help with walking and/or transfers;A little help with bathing/dressing/bathroom;Assistance with cooking/housework   Can travel by private Tax inspector (2 wheels)  Recommendations for Other Services       Functional Status Assessment Patient has had a recent decline in their functional status and demonstrates the ability to make significant improvements in function in a reasonable and predictable amount of time.     Precautions / Restrictions Precautions Precautions: Fall Restrictions Weight Bearing Restrictions: Yes RLE Weight Bearing: Weight bearing as tolerated      Mobility  Bed Mobility Overal bed mobility: Needs Assistance Bed Mobility: Supine to Sit     Supine to sit: Min assist     General bed mobility comments: light min A for R LE    Transfers Overall transfer level: Needs assistance Equipment used: Rolling walker (2 wheels) Transfers: Sit to/from Stand Sit to Stand:  Min assist           General transfer comment: Stood from bed and toilet with cues for hand placement and R LE management with min A to rise    Ambulation/Gait Ambulation/Gait assistance: Min guard Gait Distance (Feet): 70 Feet Assistive device: Rolling walker (2 wheels) Gait Pattern/deviations: Step-to pattern, Decreased stance time - right, Decreased dorsiflexion - left, Decreased dorsiflexion - right Gait velocity: decreased but functional     General Gait Details: Pt with decreased dorsiflexion/foot drop bilaterally (worse on R) but could improve with cues - baseline.  Required cues for sequencing, RW proximity , and posture. Tolerated well  Stairs            Wheelchair Mobility     Tilt Bed    Modified Rankin (Stroke Patients Only)       Balance Overall balance assessment: Needs assistance Sitting-balance support: No upper extremity supported Sitting balance-Leahy Scale: Good     Standing balance support: Bilateral upper extremity supported, No upper extremity supported Standing balance-Leahy Scale: Fair Standing balance comment: RW to ambulate but able to static stand wihtout support                             Pertinent Vitals/Pain Pain Assessment Pain Assessment: No/denies pain    Home Living Family/patient expects to be discharged to:: Private residence Living Arrangements: Spouse/significant other Available Help at Discharge: Family;Available 24 hours/day Type of Home: House Home Access: Other (comment) Entrance Stairs-Rails:  Right Entrance Stairs-Number of Steps: threshold Alternate Level Stairs-Number of Steps: 13 Home Layout: Two level;Bed/bath upstairs Home Equipment: Cane - single point;Adaptive equipment;Grab bars - tub/shower;Grab bars - toilet;Toilet riser;BSC/3in1;Shower seat Additional Comments: Had RW in past but was borrowed    Prior Function Prior Level of Function : Independent/Modified Independent;Driving              Mobility Comments: could ambulate in community; did use cane b/c of neuropathy ADLs Comments: independent adls and iadls; likes to cook     Hand Dominance        Extremity/Trunk Assessment   Upper Extremity Assessment Upper Extremity Assessment: Overall WFL for tasks assessed    Lower Extremity Assessment Lower Extremity Assessment: RLE deficits/detail;LLE deficits/detail RLE Deficits / Details: ROM WFL; MMT: ankle 3/5 (baseline from neuropathy), knee ext 3/5 not further tested due to acuity of surgery, hip 2/5 LLE Deficits / Details: ROM WFL; MMT: ankle 3/5 (neuropathy, baseline), knee and hip 5/5    Cervical / Trunk Assessment Cervical / Trunk Assessment: Normal  Communication   Communication: No difficulties  Cognition Arousal/Alertness: Awake/alert Behavior During Therapy: WFL for tasks assessed/performed Overall Cognitive Status: Within Functional Limits for tasks assessed                                          General Comments General comments (skin integrity, edema, etc.): VSS  Pt has c/o discomfort/tired/soreness in lateral low back when standing or walking extended times.  Reports problem is chronic and wondering potential causes and treatments.  Discussed could be weakness, posture, from hip , or other cause but would need further assessment.  Advised waiting until recovered from THA to assess if improved and if not could consider outpt PT.    Pt with chronic foot drop from neuropathy.  Does still have 3/5 strength and can improve with gait when focuses.  Discussed in future if worsening there are AFOs or could start with simple shoe to ankle lift AFOs.     Exercises Total Joint Exercises Ankle Circles/Pumps: AROM, Both, 10 reps, Supine   Assessment/Plan    PT Assessment Patient needs continued PT services  PT Problem List Decreased strength;Decreased mobility;Decreased safety awareness;Decreased range of motion;Decreased activity  tolerance;Decreased balance;Decreased knowledge of use of DME;Pain       PT Treatment Interventions DME instruction;Therapeutic activities;Modalities;Gait training;Therapeutic exercise;Patient/family education;Stair training;Functional mobility training;Balance training    PT Goals (Current goals can be found in the Care Plan section)  Acute Rehab PT Goals Patient Stated Goal: return home PT Goal Formulation: With patient Time For Goal Achievement: 12/14/22 Potential to Achieve Goals: Good    Frequency 7X/week     Co-evaluation               AM-PAC PT "6 Clicks" Mobility  Outcome Measure Help needed turning from your back to your side while in a flat bed without using bedrails?: A Little Help needed moving from lying on your back to sitting on the side of a flat bed without using bedrails?: A Little Help needed moving to and from a bed to a chair (including a wheelchair)?: A Little Help needed standing up from a chair using your arms (e.g., wheelchair or bedside chair)?: A Little Help needed to walk in hospital room?: A Little Help needed climbing 3-5 steps with a railing? : A Little 6 Click Score: 18  End of Session Equipment Utilized During Treatment: Gait belt Activity Tolerance: Patient tolerated treatment well Patient left: in chair;with chair alarm set;with call bell/phone within reach Nurse Communication: Mobility status PT Visit Diagnosis: Other abnormalities of gait and mobility (R26.89);Muscle weakness (generalized) (M62.81)    Time: 6270-3500 PT Time Calculation (min) (ACUTE ONLY): 38 min   Charges:   PT Evaluation $PT Eval Low Complexity: 1 Low PT Treatments $Gait Training: 8-22 mins $Therapeutic Activity: 8-22 mins PT General Charges $$ ACUTE PT VISIT: 1 Visit         Anise Salvo, PT Acute Re[facility] [phone]   Rayetta Humphrey 11/30/2022, 4:51 PM

## 2022-11-30 NOTE — Discharge Instructions (Signed)

## 2022-11-30 NOTE — Anesthesia Procedure Notes (Signed)
Spinal  Patient location during procedure: OR Start time: 11/30/2022 9:47 AM End time: 11/30/2022 9:49 AM Reason for block: surgical anesthesia Staffing Performed: anesthesiologist  Anesthesiologist: Achille Rich, MD Performed by: Achille Rich, MD Authorized by: Achille Rich, MD   Preanesthetic Checklist Completed: patient identified, IV checked, risks and benefits discussed, surgical consent, monitors and equipment checked, pre-op evaluation and timeout performed Spinal Block Patient position: sitting Prep: DuraPrep Patient monitoring: cardiac monitor, continuous pulse ox and blood pressure Approach: midline Location: L3-4 Injection technique: single-shot Needle Needle type: Pencan  Needle gauge: 24 G Needle length: 9 cm Assessment Sensory level: T10 Events: CSF return Additional Notes Functioning IV was confirmed and monitors were applied. Sterile prep and drape, including hand hygiene and sterile gloves were used. The patient was positioned and the spine was prepped. The skin was anesthetized with lidocaine.  Free flow of clear CSF was obtained prior to injecting local anesthetic into the CSF.  The spinal needle aspirated freely following injection.  The needle was carefully withdrawn.  The patient tolerated the procedure well.

## 2022-12-01 ENCOUNTER — Encounter (HOSPITAL_COMMUNITY): Payer: Self-pay | Admitting: Orthopedic Surgery

## 2022-12-01 DIAGNOSIS — Z955 Presence of coronary angioplasty implant and graft: Secondary | ICD-10-CM | POA: Diagnosis not present

## 2022-12-01 DIAGNOSIS — Z7982 Long term (current) use of aspirin: Secondary | ICD-10-CM | POA: Diagnosis not present

## 2022-12-01 DIAGNOSIS — E119 Type 2 diabetes mellitus without complications: Secondary | ICD-10-CM | POA: Diagnosis not present

## 2022-12-01 DIAGNOSIS — I10 Essential (primary) hypertension: Secondary | ICD-10-CM | POA: Diagnosis not present

## 2022-12-01 DIAGNOSIS — Z7901 Long term (current) use of anticoagulants: Secondary | ICD-10-CM | POA: Diagnosis not present

## 2022-12-01 DIAGNOSIS — Z87891 Personal history of nicotine dependence: Secondary | ICD-10-CM | POA: Diagnosis not present

## 2022-12-01 DIAGNOSIS — M1611 Unilateral primary osteoarthritis, right hip: Secondary | ICD-10-CM | POA: Diagnosis not present

## 2022-12-01 DIAGNOSIS — Z85528 Personal history of other malignant neoplasm of kidney: Secondary | ICD-10-CM | POA: Diagnosis not present

## 2022-12-01 DIAGNOSIS — Z96611 Presence of right artificial shoulder joint: Secondary | ICD-10-CM | POA: Diagnosis not present

## 2022-12-01 DIAGNOSIS — I2510 Atherosclerotic heart disease of native coronary artery without angina pectoris: Secondary | ICD-10-CM | POA: Diagnosis not present

## 2022-12-01 DIAGNOSIS — Z79899 Other long term (current) drug therapy: Secondary | ICD-10-CM | POA: Diagnosis not present

## 2022-12-01 DIAGNOSIS — I480 Paroxysmal atrial fibrillation: Secondary | ICD-10-CM | POA: Diagnosis not present

## 2022-12-01 DIAGNOSIS — Z96653 Presence of artificial knee joint, bilateral: Secondary | ICD-10-CM | POA: Diagnosis not present

## 2022-12-01 MED ORDER — SENNA-DOCUSATE SODIUM 8.6-50 MG PO TABS: 2.0000 | ORAL_TABLET | Freq: Every day | ORAL | 1 refills | Status: DC

## 2022-12-01 MED ORDER — ONDANSETRON HCL 4 MG PO TABS: 4.0000 mg | ORAL_TABLET | Freq: Three times a day (TID) | ORAL | 0 refills | Status: DC | PRN

## 2022-12-01 MED ORDER — OXYCODONE HCL 5 MG PO TABS: 5.0000 mg | ORAL_TABLET | ORAL | 0 refills | Status: DC | PRN

## 2022-12-01 MED ORDER — TRAMADOL HCL 50 MG PO TABS: 50.0000 mg | ORAL_TABLET | Freq: Four times a day (QID) | ORAL | 0 refills | Status: DC | PRN

## 2022-12-01 MED ORDER — POLYETHYLENE GLYCOL 3350 17 G PO PACK
17.0000 g | PACK | Freq: Every day | ORAL | Status: DC
  Administered 2022-12-01: 17 g via ORAL
  Filled 2022-12-01: qty 1

## 2022-12-01 MED ORDER — ASPIRIN 325 MG PO TBEC: 325.0000 mg | DELAYED_RELEASE_TABLET | Freq: Two times a day (BID) | ORAL | 0 refills | Status: DC

## 2022-12-01 NOTE — Progress Notes (Signed)
Physical Therapy Treatment Patient Details Name: Donald Gray MRN: [id number] DOB: 05-13-1940 Today's Date: 12/01/2022   History of Present Illness Pt is 82 yo male s/p R posterior THA on 11/30/22.  Pt with hx including but not limited to afib, arthritis, HTN, MI, bil feet neuropathy, NSTEMI, Renal cell CA s/p R nephrectomy, DM2, Bil TKA, lumbar decompression, and R TSA.    PT Comments  Pt is POD # 1 and is progressing well.  Pt ambulating 100' and performed stairs similar to home set up.  He tolerated well with good pain control.  Pt demonstrates safe gait & transfers in order to return home from PT perspective once discharged by MD.  While in hospital, will continue to benefit from PT for skilled therapy to advance mobility and exercises.       If plan is discharge home, recommend the following: A little help with walking and/or transfers;A little help with bathing/dressing/bathroom;Assistance with cooking/housework   Can travel by Training and development officer (2 wheels)    Recommendations for Other Services       Precautions / Restrictions Precautions Precautions: Fall Restrictions RLE Weight Bearing: Weight bearing as tolerated     Mobility  Bed Mobility Overal bed mobility: Needs Assistance Bed Mobility: Supine to Sit     Supine to sit: Supervision, HOB elevated          Transfers Overall transfer level: Needs assistance Equipment used: Rolling walker (2 wheels) Transfers: Sit to/from Stand Sit to Stand: Min assist, Min guard           General transfer comment: Stood from bed, toilet, and chair.  Cues for hand placement.  From bed needing min A otherwise min guard.    Ambulation/Gait Ambulation/Gait assistance: Supervision Gait Distance (Feet): 100 Feet Assistive device: Rolling walker (2 wheels) Gait Pattern/deviations: Step-to pattern, Decreased stance time - right, Decreased dorsiflexion - left, Decreased  dorsiflexion - right Gait velocity: decreased but functional     General Gait Details: Pt with decreased dorsiflexion/foot drop bilaterally (worse on R) but could improve with cues - baseline. Good RW proximity.  Needed cues for posture   Stairs Stairs: Yes Stairs assistance: Min guard Stair Management: One rail Right, Step to pattern, Sideways Number of Stairs: 6 General stair comments: Tolerated well without cues - had been performing this manner baseline   Wheelchair Mobility     Tilt Bed    Modified Rankin (Stroke Patients Only)       Balance Overall balance assessment: Needs assistance Sitting-balance support: No upper extremity supported Sitting balance-Leahy Scale: Good     Standing balance support: Bilateral upper extremity supported, No upper extremity supported Standing balance-Leahy Scale: Fair Standing balance comment: RW to ambulate but able to static stand wihtout support                            Cognition Arousal/Alertness: Awake/alert Behavior During Therapy: WFL for tasks assessed/performed Overall Cognitive Status: Within Functional Limits for tasks assessed                                 General Comments: Very pleasant - needs redirecting at times        Exercises Total Joint Exercises Ankle Circles/Pumps: AROM, Both, 10 reps, Supine Quad Sets: AROM, Supine, Both, 10 reps Heel Slides: AROM, Right,  10 reps, Supine Long Arc Quad: AROM, Right, 10 reps, Seated Other Exercises Other Exercises: Standing with RW and min guard, R LE, 10 reps, AROM: marching, hip abd, hip ext, h-s curls    General Comments General comments (skin integrity, edema, etc.): VSS  Educated on safe ice use, no pivots, car transfers, and TED hose during day. Also, encouraged walking every 1-2 hours during day. Educated on HEP with focus on mobility the first weeks. Discussed doing exercises within pain control and if pain increasing could  decreased ROM, reps, and stop exercises as needed. Encouraged to perform ankle pumps frequently for blood flow .      Pertinent Vitals/Pain Pain Assessment Pain Assessment: 0-10 Pain Score: 3  Pain Location: R hip Pain Descriptors / Indicators: Discomfort, Sore Pain Intervention(s): Limited activity within patient's tolerance, Monitored during session, Patient requesting pain meds-RN notified, Ice applied    Home Living                          Prior Function            PT Goals (current goals can now be found in the care plan section) Progress towards PT goals: Progressing toward goals    Frequency    7X/week      PT Plan Current plan remains appropriate    Co-evaluation              AM-PAC PT "6 Clicks" Mobility   Outcome Measure  Help needed turning from your back to your side while in a flat bed without using bedrails?: A Little Help needed moving from lying on your back to sitting on the side of a flat bed without using bedrails?: A Little Help needed moving to and from a bed to a chair (including a wheelchair)?: A Little Help needed standing up from a chair using your arms (e.g., wheelchair or bedside chair)?: A Little Help needed to walk in hospital room?: A Little Help needed climbing 3-5 steps with a railing? : A Little 6 Click Score: 18    End of Session Equipment Utilized During Treatment: Gait belt Activity Tolerance: Patient tolerated treatment well Patient left: in chair;with chair alarm set;with call bell/phone within reach Nurse Communication: Mobility status PT Visit Diagnosis: Other abnormalities of gait and mobility (R26.89);Muscle weakness (generalized) (M62.81)     Time: 5784-6962 PT Time Calculation (min) (ACUTE ONLY): 45 min  Charges:    $Gait Training: 8-22 mins $Therapeutic Exercise: 8-22 mins $Therapeutic Activity: 8-22 mins PT General Charges $$ ACUTE PT VISIT: 1 Visit                     Anise Salvo, PT Acute  Rehab [facility] Rehab [phone]    Rayetta Humphrey 12/01/2022, 10:37 AM

## 2022-12-01 NOTE — Anesthesia Postprocedure Evaluation (Signed)
Anesthesia Post Note  Patient: Donald Gray  Procedure(s) Performed: RIGHT TOTAL HIP ARTHROPLASTY (Right: Hip)     Patient location during evaluation: PACU Anesthesia Type: MAC and Spinal Level of consciousness: oriented and awake and alert Pain management: pain level controlled Vital Signs Assessment: post-procedure vital signs reviewed and stable Respiratory status: spontaneous breathing, respiratory function stable and patient connected to nasal cannula oxygen Cardiovascular status: blood pressure returned to baseline and stable Postop Assessment: no headache, no backache and no apparent nausea or vomiting Anesthetic complications: no   No notable events documented.  Last Vitals:  Vitals:   12/01/22 0553 12/01/22 1101  BP: 112/80 136/69  Pulse: 81 64  Resp: 16 15  Temp: 36.6 C 36.5 C  SpO2: 97% 99%    Last Pain:  Vitals:   12/01/22 0326  TempSrc:   PainSc: Asleep                 [name]

## 2022-12-01 NOTE — TOC Transition Note (Signed)
Transition of Care [facility]) - CM/SW Discharge Note  Patient Details  Name: Donald Gray MRN: [id number] Date of Birth: 08-12-1940  [facility]) CM/SW Contact:  Ewing Schlein, LCSW Phone Number: 12/01/2022, 10:02 AM  Clinical Narrative: Patient is expected to discharge home after working with PT. CSW met with patient to confirm discharge plan and needs. Patient will go home with HHPT, which was prearranged with [facility]. Patient will need a rolling walker, which [employer] delivered to patient's room. TOC signing off.    Final next level of care: Home w Home Health Services Barriers to Discharge: No Barriers Identified  Patient Goals and CMS Choice CMS Medicare.gov Compare Post Acute Care list provided to:: Patient Choice offered to / list presented to : Patient  Discharge Plan and Services Additional resources added to the After Visit Summary for         DME Arranged: Walker rolling DME Agency: [employer] Representative spoke with at DME Agency: Prearranged in orthopedist's office HH Arranged: PT HH Agency: [facility] Representative spoke with at [facility] Agency: Prearranged in orthopedist's office  Social Determinants of Health (SDOH) Interventions SDOH Screenings   Food Insecurity: No Food Insecurity (11/30/2022)  Housing: Low Risk  (11/30/2022)  Transportation Needs: No Transportation Needs (11/30/2022)  Utilities: Not At Risk (11/30/2022)  Tobacco Use: Medium Risk (11/30/2022)   Readmission Risk Interventions     No data to display

## 2022-12-01 NOTE — Plan of Care (Signed)
Pt ready to DC home with wife. 

## 2022-12-01 NOTE — Progress Notes (Signed)
Subjective: 1 Day Post-Op s/p Procedure(s): RIGHT TOTAL HIP ARTHROPLASTY   Patient is alert, oriented. Patient reports pain as mild. No BM yet, but voiding on own.  Denies chest pain, SOB, Calf pain. No nausea/vomiting. No other complaints.    Objective:  PE: VITALS:   Vitals:   11/30/22 1602 11/30/22 1809 12/01/22 0227 12/01/22 0553  BP: 136/78 118/86 (!) 148/80 112/80  Pulse: 79 81 86 81  Resp: 17 17 15 16   Temp: (!) 97.4 F (36.3 C) 97.7 F (36.5 C) 97.8 F (36.6 C) 97.9 F (36.6 C)  TempSrc:  Oral Oral   SpO2: 97% 97% 98% 97%  Weight:      Height:        ABD soft Sensation intact distally Intact pulses distally Dorsiflexion/Plantar flexion intact Incision: dressing C/D/I  LABS  Results for orders placed or performed during the hospital encounter of 11/30/22 (from the past 24 hour(s))  Glucose, capillary     Status: Abnormal   Collection Time: 11/30/22 11:50 AM  Result Value Ref Range   Glucose-Capillary 120 (H) 70 - 99 mg/dL  CBC     Status: Abnormal   Collection Time: 12/01/22  3:55 AM  Result Value Ref Range   WBC 11.1 (H) 4.0 - 10.5 K/uL   RBC 4.11 (L) 4.22 - 5.81 MIL/uL   Hemoglobin 12.4 (L) 13.0 - 17.0 g/dL   HCT 95.6 (L) 21.3 - 08.6 %   MCV 89.8 80.0 - 100.0 fL   MCH 30.2 26.0 - 34.0 pg   MCHC 33.6 30.0 - 36.0 g/dL   RDW 57.8 46.9 - 62.9 %   Platelets 158 150 - 400 K/uL   nRBC 0.0 0.0 - 0.2 %  Basic metabolic panel     Status: Abnormal   Collection Time: 12/01/22  3:55 AM  Result Value Ref Range   Sodium 133 (L) 135 - 145 mmol/L   Potassium 4.5 3.5 - 5.1 mmol/L   Chloride 104 98 - 111 mmol/L   CO2 20 (L) 22 - 32 mmol/L   Glucose, Bld 199 (H) 70 - 99 mg/dL   BUN 27 (H) 8 - 23 mg/dL   Creatinine, Ser 5.28 (H) 0.61 - 1.24 mg/dL   Calcium 8.6 (L) 8.9 - 10.3 mg/dL   GFR, Estimated 53 (L) >60 mL/min   Anion gap 9 5 - 15    DG Pelvis Portable  Result Date: 11/30/2022 CLINICAL DATA:  Status post total right hip arthroplasty. EXAM: DG  HIP (WITH OR WITHOUT PELVIS) 1V PORT RIGHT; PORTABLE PELVIS 1-2 VIEWS COMPARISON:  Pelvis and right hip radiographs 05/04/2022 FINDINGS: Interval total right hip arthroplasty. Mild superomedial left femoroacetabular joint space narrowing with mild peripheral acetabular and femoral head-neck junction degenerative osteophytes. Suspected postoperative lateral right hip subcutaneous air. No acute fracture or dislocation. Mild-to-moderate atherosclerotic calcifications. IMPRESSION: Interval total right hip arthroplasty without evidence of hardware failure. Electronically Signed   By: Neita Garnet M.D.   On: 11/30/2022 16:06   DG Hip Port Unilat With Pelvis 1V Right  Result Date: 11/30/2022 CLINICAL DATA:  Status post total right hip arthroplasty. EXAM: DG HIP (WITH OR WITHOUT PELVIS) 1V PORT RIGHT; PORTABLE PELVIS 1-2 VIEWS COMPARISON:  Pelvis and right hip radiographs 05/04/2022 FINDINGS: Interval total right hip arthroplasty. Mild superomedial left femoroacetabular joint space narrowing with mild peripheral acetabular and femoral head-neck junction degenerative osteophytes. Suspected postoperative lateral right hip subcutaneous air. No acute fracture or dislocation. Mild-to-moderate atherosclerotic calcifications. IMPRESSION: Interval total right  hip arthroplasty without evidence of hardware failure. Electronically Signed   By: Neita Garnet M.D.   On: 11/30/2022 16:06    Assessment/Plan: Principal Problem:   S/P total right hip arthroplasty  1 Day Post-Op s/p Procedure(s): RIGHT TOTAL HIP ARTHROPLASTY  Weightbearing: WBAT RLE Insicional and dressing care: Reinforce dressings as needed VTE prophylaxis: Aspirin 325mg  BID x 30 days Pain control: continue current regimen Follow - up plan: 2 weeks with Dr. Dion Saucier Dispo: pending with stair training with PT. Hopeful to discharge home today.   Contact information:   Janine Ores, Cordelia Poche [id number] [id number]  After hours and holidays please check [email or web address] for  group call information for [facility]  Armida Sans 12/01/2022, 8:39 AM

## 2022-12-01 NOTE — Care Management Obs Status (Signed)
MEDICARE OBSERVATION STATUS NOTIFICATION   Patient Details  Name: Donald Gray MRN: [id number] Date of Birth: 1940-09-11   Medicare Observation Status Notification Given:  Yes    Ewing Schlein, LCSW 12/01/2022, 12:48 PM

## 2022-12-01 NOTE — Discharge Summary (Signed)
Discharge Summary  Patient ID: [name] MRN: [id number] DOB/AGE: [birth year] 82 y.o.  Admit date: 11/30/2022 Discharge date: 12/01/2022  Admission Diagnoses:  S/P total right hip arthroplasty  Discharge Diagnoses:  Principal Problem:   S/P total right hip arthroplasty   Past Medical History:  Diagnosis Date   Anemia    Anticoagulated- to go to Xarelto for a fib 10/07/2013   Arthritis    "fingers, toes" (10/05/2013)   Coronary artery disease    a. s/p MI with stent in 2000   Diverticulosis    Dysrhythmia    GERD (gastroesophageal reflux disease)    History of blood transfusion    "w/nephrectomy; w/shoulder replacement; w/knee replacement"   Hyperlipidemia    Hypertension    Left knee DJD    Myocardial infarction (HCC) 05/04/1995   Neuropathy    feet bilat    NSTEMI (non-ST elevated myocardial infarction), type 2 due to demand ischemia 10/07/2013   PAF (paroxysmal atrial fibrillation) (HCC)    Pneumonia ~ 2007   again 3 months ago    Primary localized osteoarthritis of right knee    Renal cell carcinoma (HCC)    a. s/p right nephrectomy 08/14/09   Shortness of breath    a. related to chemo drugs   Tendonitis    currently on prednisone and wearing brace (left wrist)   Type II diabetes mellitus (HCC)    "treating w/diet and weight loss"    Surgeries: Procedure(s): RIGHT TOTAL HIP ARTHROPLASTY on 11/30/2022   Consultants (if any):   Discharged Condition: Improved  Hospital Course: Donald Gray is an 82 y.o. male who was admitted 11/30/2022 with a diagnosis of S/P total right hip arthroplasty and went to the operating room on 11/30/2022 and underwent the above named procedures.    He was given perioperative antibiotics:  Anti-infectives (From admission, onward)    Start     Dose/Rate Route Frequency Ordered Stop   11/30/22 1630  ceFAZolin (ANCEF) IVPB 2g/100 mL premix        2 g 200 mL/hr over 30 Minutes Intravenous Every 6 hours 11/30/22 1519  11/30/22 2309   11/30/22 0745  ceFAZolin (ANCEF) IVPB 2g/100 mL premix        2 g 200 mL/hr over 30 Minutes Intravenous On call to O.R. 11/30/22 4098 11/30/22 1191     .  He was given sequential compression devices, early ambulation, and aspirin for DVT prophylaxis.  He benefited maximally from the hospital stay and there were no complications.    Recent vital signs:  Vitals:   12/01/22 0553 12/01/22 1101  BP: 112/80 136/69  Pulse: 81 64  Resp: 16 15  Temp: 97.9 F (36.6 C) 97.7 F (36.5 C)  SpO2: 97% 99%    Recent laboratory studies:  Lab Results  Component Value Date   HGB 12.4 (L) 12/01/2022   HGB 14.8 11/26/2022   HGB 14.4 03/10/2022   Lab Results  Component Value Date   WBC 11.1 (H) 12/01/2022   PLT 158 12/01/2022   Lab Results  Component Value Date   INR 1.5 (H) 03/10/2022   Lab Results  Component Value Date   NA 133 (L) 12/01/2022   K 4.5 12/01/2022   CL 104 12/01/2022   CO2 20 (L) 12/01/2022   BUN 27 (H) 12/01/2022   CREATININE 1.33 (H) 12/01/2022   GLUCOSE 199 (H) 12/01/2022    Discharge Medications:   Allergies as of 12/01/2022       Reactions  Penicillins Swelling   Tolerated Ancef 12/01/22   Ambien [zolpidem Tartrate] Other (See Comments)   Causes hallucinations.    Codeine Other (See Comments)   hallucinations    Lisinopril Cough        Medication List     STOP taking these medications    oxyCODONE 5 MG immediate release tablet Commonly known as: Oxy IR/ROXICODONE       TAKE these medications    ascorbic acid 500 MG tablet Commonly known as: VITAMIN C Take 500 mg by mouth daily.   aspirin EC 325 MG tablet Take 1 tablet (325 mg total) by mouth 2 (two) times daily. What changed:  medication strength how much to take when to take this additional instructions   atorvastatin 10 MG tablet Commonly known as: LIPITOR Take 1 tablet (10 mg total) by mouth daily with breakfast. What changed: when to take this    azelastine 0.1 % nasal spray Commonly known as: ASTELIN Place 1 spray into both nostrils as needed for rhinitis or allergies. Use in each nostril as directed   cholecalciferol 1000 units tablet Commonly known as: VITAMIN D Take 1,000 Units by mouth daily.   Coenzyme Q10 100 MG capsule Take 100 mg by mouth daily.   fexofenadine 180 MG tablet Commonly known as: ALLEGRA Take 180 mg by mouth daily.   Fish Oil 1200 MG Caps Take 1 capsule by mouth daily at 12 noon.   magnesium gluconate 500 MG tablet Commonly known as: MAGONATE Take 500 mg by mouth 2 (two) times daily.   metoprolol succinate 50 MG 24 hr tablet Commonly known as: TOPROL-XL Take 50 mg by mouth in the morning, at noon, in the evening, and at bedtime. Take with or immediately following a meal.   metoprolol tartrate 25 MG tablet Commonly known as: LOPRESSOR Take 1 tablet (25 mg total) by mouth 2 (two) times daily.   multivitamin with minerals Tabs tablet Take 1 tablet by mouth daily.   nitroGLYCERIN 0.4 MG SL tablet Commonly known as: NITROSTAT Place 1 tablet (0.4 mg total) under the tongue every 5 (five) minutes as needed for chest pain.   ondansetron 4 MG tablet Commonly known as: Zofran Take 1 tablet (4 mg total) by mouth every 8 (eight) hours as needed for nausea or vomiting.   sennosides-docusate sodium 8.6-50 MG tablet Commonly known as: SENOKOT-S Take 2 tablets by mouth daily.   telmisartan 80 MG tablet Commonly known as: MICARDIS Take 1 tablet (80 mg total) by mouth every evening.   traMADol 50 MG tablet Commonly known as: ULTRAM Take 1 tablet (50 mg total) by mouth every 6 (six) hours as needed for severe pain.        Diagnostic Studies: DG Pelvis Portable  Result Date: 11/30/2022 CLINICAL DATA:  Status post total right hip arthroplasty. EXAM: DG HIP (WITH OR WITHOUT PELVIS) 1V PORT RIGHT; PORTABLE PELVIS 1-2 VIEWS COMPARISON:  Pelvis and right hip radiographs 05/04/2022 FINDINGS: Interval  total right hip arthroplasty. Mild superomedial left femoroacetabular joint space narrowing with mild peripheral acetabular and femoral head-neck junction degenerative osteophytes. Suspected postoperative lateral right hip subcutaneous air. No acute fracture or dislocation. Mild-to-moderate atherosclerotic calcifications. IMPRESSION: Interval total right hip arthroplasty without evidence of hardware failure. Electronically Signed   By: Neita Garnet M.D.   On: 11/30/2022 16:06   DG Hip Port Unilat With Pelvis 1V Right  Result Date: 11/30/2022 CLINICAL DATA:  Status post total right hip arthroplasty. EXAM: DG HIP (WITH OR WITHOUT PELVIS)  1V PORT RIGHT; PORTABLE PELVIS 1-2 VIEWS COMPARISON:  Pelvis and right hip radiographs 05/04/2022 FINDINGS: Interval total right hip arthroplasty. Mild superomedial left femoroacetabular joint space narrowing with mild peripheral acetabular and femoral head-neck junction degenerative osteophytes. Suspected postoperative lateral right hip subcutaneous air. No acute fracture or dislocation. Mild-to-moderate atherosclerotic calcifications. IMPRESSION: Interval total right hip arthroplasty without evidence of hardware failure. Electronically Signed   By: Neita Garnet M.D.   On: 11/30/2022 16:06    Disposition: Discharge disposition: 06-Home-Health Care Svc          Follow-up Information     Teryl Lucy, MD. Go on 12/13/2022.   Specialty: Orthopedic Surgery Why: Your appointment is scheduled for 4:15. Contact information: [address] [place] [address] [phone]         [facility] Follow up.   Why: HHPT will provide 6 home visits prior to starting outpatient physical therapy Contact information: [address] [place] [address] [phone]         [facility] Follow up.   Why: Your outpatient physical therapy is at 3:15. Please arrive at 3:00 to complete your  paperwork Contact information: [facility] [address] [place] [address] [phone]                  Signed: Annita Brod 12/01/2022, 12:20 PM

## 2022-12-01 NOTE — Plan of Care (Signed)
  Problem: Pain Management: Goal: Pain level will decrease with appropriate interventions Outcome: Progressing   

## 2022-12-03 DIAGNOSIS — E785 Hyperlipidemia, unspecified: Secondary | ICD-10-CM | POA: Diagnosis not present

## 2022-12-03 DIAGNOSIS — Z85118 Personal history of other malignant neoplasm of bronchus and lung: Secondary | ICD-10-CM | POA: Diagnosis not present

## 2022-12-03 DIAGNOSIS — I480 Paroxysmal atrial fibrillation: Secondary | ICD-10-CM | POA: Diagnosis not present

## 2022-12-03 DIAGNOSIS — E1122 Type 2 diabetes mellitus with diabetic chronic kidney disease: Secondary | ICD-10-CM | POA: Diagnosis not present

## 2022-12-03 DIAGNOSIS — I252 Old myocardial infarction: Secondary | ICD-10-CM | POA: Diagnosis not present

## 2022-12-03 DIAGNOSIS — M1611 Unilateral primary osteoarthritis, right hip: Secondary | ICD-10-CM | POA: Diagnosis not present

## 2022-12-03 DIAGNOSIS — N183 Chronic kidney disease, stage 3 unspecified: Secondary | ICD-10-CM | POA: Diagnosis not present

## 2022-12-03 DIAGNOSIS — E1142 Type 2 diabetes mellitus with diabetic polyneuropathy: Secondary | ICD-10-CM | POA: Diagnosis not present

## 2022-12-03 DIAGNOSIS — Z85528 Personal history of other malignant neoplasm of kidney: Secondary | ICD-10-CM | POA: Diagnosis not present

## 2022-12-03 DIAGNOSIS — I2510 Atherosclerotic heart disease of native coronary artery without angina pectoris: Secondary | ICD-10-CM | POA: Diagnosis not present

## 2022-12-03 DIAGNOSIS — K219 Gastro-esophageal reflux disease without esophagitis: Secondary | ICD-10-CM | POA: Diagnosis not present

## 2022-12-03 DIAGNOSIS — J302 Other seasonal allergic rhinitis: Secondary | ICD-10-CM | POA: Diagnosis not present

## 2022-12-03 DIAGNOSIS — K5790 Diverticulosis of intestine, part unspecified, without perforation or abscess without bleeding: Secondary | ICD-10-CM | POA: Diagnosis not present

## 2022-12-03 DIAGNOSIS — Z96641 Presence of right artificial hip joint: Secondary | ICD-10-CM | POA: Diagnosis not present

## 2022-12-03 DIAGNOSIS — Z96653 Presence of artificial knee joint, bilateral: Secondary | ICD-10-CM | POA: Diagnosis not present

## 2022-12-03 DIAGNOSIS — Z471 Aftercare following joint replacement surgery: Secondary | ICD-10-CM | POA: Diagnosis not present

## 2022-12-03 DIAGNOSIS — Z96611 Presence of right artificial shoulder joint: Secondary | ICD-10-CM | POA: Diagnosis not present

## 2022-12-03 DIAGNOSIS — Z905 Acquired absence of kidney: Secondary | ICD-10-CM | POA: Diagnosis not present

## 2022-12-03 DIAGNOSIS — N400 Enlarged prostate without lower urinary tract symptoms: Secondary | ICD-10-CM | POA: Diagnosis not present

## 2022-12-03 DIAGNOSIS — Z7982 Long term (current) use of aspirin: Secondary | ICD-10-CM | POA: Diagnosis not present

## 2022-12-03 DIAGNOSIS — D649 Anemia, unspecified: Secondary | ICD-10-CM | POA: Diagnosis not present

## 2022-12-03 DIAGNOSIS — Z955 Presence of coronary angioplasty implant and graft: Secondary | ICD-10-CM | POA: Diagnosis not present

## 2022-12-03 DIAGNOSIS — M1909 Primary osteoarthritis, other specified site: Secondary | ICD-10-CM | POA: Diagnosis not present

## 2022-12-03 DIAGNOSIS — I129 Hypertensive chronic kidney disease with stage 1 through stage 4 chronic kidney disease, or unspecified chronic kidney disease: Secondary | ICD-10-CM | POA: Diagnosis not present

## 2022-12-06 DIAGNOSIS — Z96611 Presence of right artificial shoulder joint: Secondary | ICD-10-CM | POA: Diagnosis not present

## 2022-12-06 DIAGNOSIS — J302 Other seasonal allergic rhinitis: Secondary | ICD-10-CM | POA: Diagnosis not present

## 2022-12-06 DIAGNOSIS — M1909 Primary osteoarthritis, other specified site: Secondary | ICD-10-CM | POA: Diagnosis not present

## 2022-12-06 DIAGNOSIS — I480 Paroxysmal atrial fibrillation: Secondary | ICD-10-CM | POA: Diagnosis not present

## 2022-12-06 DIAGNOSIS — Z85118 Personal history of other malignant neoplasm of bronchus and lung: Secondary | ICD-10-CM | POA: Diagnosis not present

## 2022-12-06 DIAGNOSIS — Z471 Aftercare following joint replacement surgery: Secondary | ICD-10-CM | POA: Diagnosis not present

## 2022-12-06 DIAGNOSIS — N400 Enlarged prostate without lower urinary tract symptoms: Secondary | ICD-10-CM | POA: Diagnosis not present

## 2022-12-06 DIAGNOSIS — I2510 Atherosclerotic heart disease of native coronary artery without angina pectoris: Secondary | ICD-10-CM | POA: Diagnosis not present

## 2022-12-06 DIAGNOSIS — E1122 Type 2 diabetes mellitus with diabetic chronic kidney disease: Secondary | ICD-10-CM | POA: Diagnosis not present

## 2022-12-06 DIAGNOSIS — D649 Anemia, unspecified: Secondary | ICD-10-CM | POA: Diagnosis not present

## 2022-12-06 DIAGNOSIS — E1142 Type 2 diabetes mellitus with diabetic polyneuropathy: Secondary | ICD-10-CM | POA: Diagnosis not present

## 2022-12-06 DIAGNOSIS — Z96653 Presence of artificial knee joint, bilateral: Secondary | ICD-10-CM | POA: Diagnosis not present

## 2022-12-06 DIAGNOSIS — Z905 Acquired absence of kidney: Secondary | ICD-10-CM | POA: Diagnosis not present

## 2022-12-06 DIAGNOSIS — Z96641 Presence of right artificial hip joint: Secondary | ICD-10-CM | POA: Diagnosis not present

## 2022-12-06 DIAGNOSIS — K5790 Diverticulosis of intestine, part unspecified, without perforation or abscess without bleeding: Secondary | ICD-10-CM | POA: Diagnosis not present

## 2022-12-06 DIAGNOSIS — Z7982 Long term (current) use of aspirin: Secondary | ICD-10-CM | POA: Diagnosis not present

## 2022-12-06 DIAGNOSIS — Z955 Presence of coronary angioplasty implant and graft: Secondary | ICD-10-CM | POA: Diagnosis not present

## 2022-12-06 DIAGNOSIS — N183 Chronic kidney disease, stage 3 unspecified: Secondary | ICD-10-CM | POA: Diagnosis not present

## 2022-12-06 DIAGNOSIS — I129 Hypertensive chronic kidney disease with stage 1 through stage 4 chronic kidney disease, or unspecified chronic kidney disease: Secondary | ICD-10-CM | POA: Diagnosis not present

## 2022-12-06 DIAGNOSIS — K219 Gastro-esophageal reflux disease without esophagitis: Secondary | ICD-10-CM | POA: Diagnosis not present

## 2022-12-06 DIAGNOSIS — E785 Hyperlipidemia, unspecified: Secondary | ICD-10-CM | POA: Diagnosis not present

## 2022-12-06 DIAGNOSIS — I252 Old myocardial infarction: Secondary | ICD-10-CM | POA: Diagnosis not present

## 2022-12-06 DIAGNOSIS — Z85528 Personal history of other malignant neoplasm of kidney: Secondary | ICD-10-CM | POA: Diagnosis not present

## 2022-12-08 DIAGNOSIS — E1122 Type 2 diabetes mellitus with diabetic chronic kidney disease: Secondary | ICD-10-CM | POA: Diagnosis not present

## 2022-12-08 DIAGNOSIS — Z955 Presence of coronary angioplasty implant and graft: Secondary | ICD-10-CM | POA: Diagnosis not present

## 2022-12-08 DIAGNOSIS — Z7982 Long term (current) use of aspirin: Secondary | ICD-10-CM | POA: Diagnosis not present

## 2022-12-08 DIAGNOSIS — E1142 Type 2 diabetes mellitus with diabetic polyneuropathy: Secondary | ICD-10-CM | POA: Diagnosis not present

## 2022-12-08 DIAGNOSIS — N183 Chronic kidney disease, stage 3 unspecified: Secondary | ICD-10-CM | POA: Diagnosis not present

## 2022-12-08 DIAGNOSIS — Z96653 Presence of artificial knee joint, bilateral: Secondary | ICD-10-CM | POA: Diagnosis not present

## 2022-12-08 DIAGNOSIS — I252 Old myocardial infarction: Secondary | ICD-10-CM | POA: Diagnosis not present

## 2022-12-08 DIAGNOSIS — K5790 Diverticulosis of intestine, part unspecified, without perforation or abscess without bleeding: Secondary | ICD-10-CM | POA: Diagnosis not present

## 2022-12-08 DIAGNOSIS — K219 Gastro-esophageal reflux disease without esophagitis: Secondary | ICD-10-CM | POA: Diagnosis not present

## 2022-12-08 DIAGNOSIS — I480 Paroxysmal atrial fibrillation: Secondary | ICD-10-CM | POA: Diagnosis not present

## 2022-12-08 DIAGNOSIS — Z96611 Presence of right artificial shoulder joint: Secondary | ICD-10-CM | POA: Diagnosis not present

## 2022-12-08 DIAGNOSIS — J302 Other seasonal allergic rhinitis: Secondary | ICD-10-CM | POA: Diagnosis not present

## 2022-12-08 DIAGNOSIS — N400 Enlarged prostate without lower urinary tract symptoms: Secondary | ICD-10-CM | POA: Diagnosis not present

## 2022-12-08 DIAGNOSIS — D649 Anemia, unspecified: Secondary | ICD-10-CM | POA: Diagnosis not present

## 2022-12-08 DIAGNOSIS — Z96641 Presence of right artificial hip joint: Secondary | ICD-10-CM | POA: Diagnosis not present

## 2022-12-08 DIAGNOSIS — M1909 Primary osteoarthritis, other specified site: Secondary | ICD-10-CM | POA: Diagnosis not present

## 2022-12-08 DIAGNOSIS — I129 Hypertensive chronic kidney disease with stage 1 through stage 4 chronic kidney disease, or unspecified chronic kidney disease: Secondary | ICD-10-CM | POA: Diagnosis not present

## 2022-12-08 DIAGNOSIS — Z85118 Personal history of other malignant neoplasm of bronchus and lung: Secondary | ICD-10-CM | POA: Diagnosis not present

## 2022-12-08 DIAGNOSIS — I2510 Atherosclerotic heart disease of native coronary artery without angina pectoris: Secondary | ICD-10-CM | POA: Diagnosis not present

## 2022-12-08 DIAGNOSIS — Z471 Aftercare following joint replacement surgery: Secondary | ICD-10-CM | POA: Diagnosis not present

## 2022-12-08 DIAGNOSIS — Z85528 Personal history of other malignant neoplasm of kidney: Secondary | ICD-10-CM | POA: Diagnosis not present

## 2022-12-08 DIAGNOSIS — E785 Hyperlipidemia, unspecified: Secondary | ICD-10-CM | POA: Diagnosis not present

## 2022-12-08 DIAGNOSIS — Z905 Acquired absence of kidney: Secondary | ICD-10-CM | POA: Diagnosis not present

## 2022-12-10 DIAGNOSIS — M1909 Primary osteoarthritis, other specified site: Secondary | ICD-10-CM | POA: Diagnosis not present

## 2022-12-10 DIAGNOSIS — E1142 Type 2 diabetes mellitus with diabetic polyneuropathy: Secondary | ICD-10-CM | POA: Diagnosis not present

## 2022-12-10 DIAGNOSIS — K219 Gastro-esophageal reflux disease without esophagitis: Secondary | ICD-10-CM | POA: Diagnosis not present

## 2022-12-10 DIAGNOSIS — I129 Hypertensive chronic kidney disease with stage 1 through stage 4 chronic kidney disease, or unspecified chronic kidney disease: Secondary | ICD-10-CM | POA: Diagnosis not present

## 2022-12-10 DIAGNOSIS — K5790 Diverticulosis of intestine, part unspecified, without perforation or abscess without bleeding: Secondary | ICD-10-CM | POA: Diagnosis not present

## 2022-12-10 DIAGNOSIS — E1122 Type 2 diabetes mellitus with diabetic chronic kidney disease: Secondary | ICD-10-CM | POA: Diagnosis not present

## 2022-12-10 DIAGNOSIS — Z955 Presence of coronary angioplasty implant and graft: Secondary | ICD-10-CM | POA: Diagnosis not present

## 2022-12-10 DIAGNOSIS — N183 Chronic kidney disease, stage 3 unspecified: Secondary | ICD-10-CM | POA: Diagnosis not present

## 2022-12-10 DIAGNOSIS — Z85528 Personal history of other malignant neoplasm of kidney: Secondary | ICD-10-CM | POA: Diagnosis not present

## 2022-12-10 DIAGNOSIS — I252 Old myocardial infarction: Secondary | ICD-10-CM | POA: Diagnosis not present

## 2022-12-10 DIAGNOSIS — D649 Anemia, unspecified: Secondary | ICD-10-CM | POA: Diagnosis not present

## 2022-12-10 DIAGNOSIS — Z96641 Presence of right artificial hip joint: Secondary | ICD-10-CM | POA: Diagnosis not present

## 2022-12-10 DIAGNOSIS — N400 Enlarged prostate without lower urinary tract symptoms: Secondary | ICD-10-CM | POA: Diagnosis not present

## 2022-12-10 DIAGNOSIS — Z7982 Long term (current) use of aspirin: Secondary | ICD-10-CM | POA: Diagnosis not present

## 2022-12-10 DIAGNOSIS — Z96653 Presence of artificial knee joint, bilateral: Secondary | ICD-10-CM | POA: Diagnosis not present

## 2022-12-10 DIAGNOSIS — J302 Other seasonal allergic rhinitis: Secondary | ICD-10-CM | POA: Diagnosis not present

## 2022-12-10 DIAGNOSIS — I480 Paroxysmal atrial fibrillation: Secondary | ICD-10-CM | POA: Diagnosis not present

## 2022-12-10 DIAGNOSIS — Z85118 Personal history of other malignant neoplasm of bronchus and lung: Secondary | ICD-10-CM | POA: Diagnosis not present

## 2022-12-10 DIAGNOSIS — Z905 Acquired absence of kidney: Secondary | ICD-10-CM | POA: Diagnosis not present

## 2022-12-10 DIAGNOSIS — I2510 Atherosclerotic heart disease of native coronary artery without angina pectoris: Secondary | ICD-10-CM | POA: Diagnosis not present

## 2022-12-10 DIAGNOSIS — Z96611 Presence of right artificial shoulder joint: Secondary | ICD-10-CM | POA: Diagnosis not present

## 2022-12-10 DIAGNOSIS — Z471 Aftercare following joint replacement surgery: Secondary | ICD-10-CM | POA: Diagnosis not present

## 2022-12-10 DIAGNOSIS — E785 Hyperlipidemia, unspecified: Secondary | ICD-10-CM | POA: Diagnosis not present

## 2022-12-13 DIAGNOSIS — M1611 Unilateral primary osteoarthritis, right hip: Secondary | ICD-10-CM | POA: Diagnosis not present

## 2022-12-21 ENCOUNTER — Other Ambulatory Visit: Payer: Self-pay | Admitting: Physician Assistant

## 2022-12-21 DIAGNOSIS — M1611 Unilateral primary osteoarthritis, right hip: Secondary | ICD-10-CM | POA: Diagnosis not present

## 2022-12-23 DIAGNOSIS — M1611 Unilateral primary osteoarthritis, right hip: Secondary | ICD-10-CM | POA: Diagnosis not present

## 2022-12-24 ENCOUNTER — Other Ambulatory Visit: Payer: Self-pay

## 2022-12-24 MED ORDER — TELMISARTAN 80 MG PO TABS: 80.0000 mg | ORAL_TABLET | Freq: Every evening | ORAL | 0 refills | Status: DC

## 2022-12-24 NOTE — Telephone Encounter (Signed)
Patient called in for a temporary prescription of Telmisartan to be sent to local pharmacy due to mail order being backed up and running out of pills before delivery date.

## 2022-12-28 DIAGNOSIS — M1611 Unilateral primary osteoarthritis, right hip: Secondary | ICD-10-CM | POA: Diagnosis not present

## 2022-12-30 DIAGNOSIS — M1611 Unilateral primary osteoarthritis, right hip: Secondary | ICD-10-CM | POA: Diagnosis not present

## 2023-01-06 DIAGNOSIS — G629 Polyneuropathy, unspecified: Secondary | ICD-10-CM | POA: Diagnosis not present

## 2023-01-06 DIAGNOSIS — Z85528 Personal history of other malignant neoplasm of kidney: Secondary | ICD-10-CM | POA: Diagnosis not present

## 2023-01-06 DIAGNOSIS — I480 Paroxysmal atrial fibrillation: Secondary | ICD-10-CM | POA: Diagnosis not present

## 2023-01-06 DIAGNOSIS — G609 Hereditary and idiopathic neuropathy, unspecified: Secondary | ICD-10-CM | POA: Diagnosis not present

## 2023-01-06 DIAGNOSIS — I10 Essential (primary) hypertension: Secondary | ICD-10-CM | POA: Diagnosis not present

## 2023-01-06 DIAGNOSIS — E1142 Type 2 diabetes mellitus with diabetic polyneuropathy: Secondary | ICD-10-CM | POA: Diagnosis not present

## 2023-01-06 DIAGNOSIS — R809 Proteinuria, unspecified: Secondary | ICD-10-CM | POA: Diagnosis not present

## 2023-01-06 DIAGNOSIS — I2510 Atherosclerotic heart disease of native coronary artery without angina pectoris: Secondary | ICD-10-CM | POA: Diagnosis not present

## 2023-01-06 DIAGNOSIS — E7800 Pure hypercholesterolemia, unspecified: Secondary | ICD-10-CM | POA: Diagnosis not present

## 2023-01-06 DIAGNOSIS — N1831 Chronic kidney disease, stage 3a: Secondary | ICD-10-CM | POA: Diagnosis not present

## 2023-01-10 DIAGNOSIS — S9031XA Contusion of right foot, initial encounter: Secondary | ICD-10-CM | POA: Diagnosis not present

## 2023-04-12 DIAGNOSIS — J069 Acute upper respiratory infection, unspecified: Secondary | ICD-10-CM | POA: Diagnosis not present

## 2023-04-12 DIAGNOSIS — Z03818 Encounter for observation for suspected exposure to other biological agents ruled out: Secondary | ICD-10-CM | POA: Diagnosis not present

## 2023-05-10 ENCOUNTER — Other Ambulatory Visit: Payer: Self-pay | Admitting: Interventional Cardiology

## 2023-05-25 DIAGNOSIS — H5203 Hypermetropia, bilateral: Secondary | ICD-10-CM | POA: Diagnosis not present

## 2023-05-25 DIAGNOSIS — E119 Type 2 diabetes mellitus without complications: Secondary | ICD-10-CM | POA: Diagnosis not present

## 2023-05-25 DIAGNOSIS — H2513 Age-related nuclear cataract, bilateral: Secondary | ICD-10-CM | POA: Diagnosis not present

## 2023-06-13 DIAGNOSIS — L570 Actinic keratosis: Secondary | ICD-10-CM | POA: Diagnosis not present

## 2023-06-13 DIAGNOSIS — L821 Other seborrheic keratosis: Secondary | ICD-10-CM | POA: Diagnosis not present

## 2023-06-13 DIAGNOSIS — D225 Melanocytic nevi of trunk: Secondary | ICD-10-CM | POA: Diagnosis not present

## 2023-06-13 DIAGNOSIS — D2239 Melanocytic nevi of other parts of face: Secondary | ICD-10-CM | POA: Diagnosis not present

## 2023-06-13 DIAGNOSIS — L2989 Other pruritus: Secondary | ICD-10-CM | POA: Diagnosis not present

## 2023-06-13 DIAGNOSIS — D2362 Other benign neoplasm of skin of left upper limb, including shoulder: Secondary | ICD-10-CM | POA: Diagnosis not present

## 2023-06-13 DIAGNOSIS — L720 Epidermal cyst: Secondary | ICD-10-CM | POA: Diagnosis not present

## 2023-06-13 DIAGNOSIS — D1801 Hemangioma of skin and subcutaneous tissue: Secondary | ICD-10-CM | POA: Diagnosis not present

## 2023-06-13 DIAGNOSIS — L814 Other melanin hyperpigmentation: Secondary | ICD-10-CM | POA: Diagnosis not present

## 2023-06-24 DIAGNOSIS — M1611 Unilateral primary osteoarthritis, right hip: Secondary | ICD-10-CM | POA: Diagnosis not present

## 2023-06-30 DIAGNOSIS — Z0000 Encounter for general adult medical examination without abnormal findings: Secondary | ICD-10-CM | POA: Diagnosis not present

## 2023-06-30 DIAGNOSIS — Z1331 Encounter for screening for depression: Secondary | ICD-10-CM | POA: Diagnosis not present

## 2023-07-18 NOTE — Progress Notes (Signed)
 Cardiology Office Note:  .   Date:  07/26/2023  ID:  Donald Gray, DOB 08/17/1940, MRN [id number] PCP: Emilio Aspen, MD  [facility] Providers Cardiologist:  Yates Decamp, MD   History of Present Illness: Donald Gray is a 83 y.o. Caucasian male patient with history of coronary artery disease, RCA stenting in 1995, also had atrial fibrillation needing cardioversion remotely sometime in 2008, anticoagulation was discontinued in Dec 2023 per patient preference, diet-controlled diabetes mellitus, hypertension, hypercholesterolemia.  His renal cancer was treated with right nephrectomy and Immunotherapy with cure in 2009. He presents for annual visit.  Except for mild chronic dyspnea that is stable, he has no specific complaints.  Discussed the use of AI scribe software for clinical note transcription with the patient, who gave verbal consent to proceed.  History of Present Illness   The patient, with a history of kidney cancer, coronary artery disease, hypertension, and atrial fibrillation, presents with shortness of breath, particularly when climbing stairs. The patient reports that this symptom has been present for a long time. The patient also mentions neuropathy in the lower extremities, a side effect of previous immunotherapy for kidney cancer. This neuropathy has limited the patient's ability to exercise, particularly walking. Despite this, the patient maintains an active lifestyle, going to the gym three days a week and performing upper body exercises and leg exercises with weights. The patient also uses a recumbent bike and a walking machine at home for cardio exercise.  The patient's kidney cancer was treated with immunotherapy approximately 14 years ago, and the patient has been cancer-free since then. The patient had a right nephrectomy as part of the cancer treatment. The patient also had a stent placed for coronary artery disease in 1995 and was diagnosed with  atrial fibrillation around 2008. The patient stopped taking Xarelto for the atrial fibrillation in December of the previous year and is now on 81 mg of aspirin daily. The patient's hypertension is controlled with metoprolol succinate 25 mg daily and telmisartan 80 mg daily. The patient's cholesterol is managed with atorvastatin 10 mg daily.       Labs    Lab Results  Component Value Date   NA 133 (L) 12/01/2022   K 4.5 12/01/2022   CO2 20 (L) 12/01/2022   GLUCOSE 199 (H) 12/01/2022   BUN 27 (H) 12/01/2022   CREATININE 1.33 (H) 12/01/2022   CALCIUM 8.6 (L) 12/01/2022   GFR 60.16 10/23/2013   GFRNONAA 53 (L) 12/01/2022      Latest Ref Rng & Units 12/01/2022    3:55 AM 11/26/2022   10:19 AM 03/10/2022   11:25 AM  BMP  Glucose 70 - 99 mg/dL 409  811  914   BUN 8 - 23 mg/dL 27  21  19    Creatinine 0.61 - 1.24 mg/dL 7.82  9.56  2.13   Sodium 135 - 145 mmol/L 133  135  136   Potassium 3.5 - 5.1 mmol/L 4.5  4.3  4.5   Chloride 98 - 111 mmol/L 104  104  107   CO2 22 - 32 mmol/L 20  22  25    Calcium 8.9 - 10.3 mg/dL 8.6  9.0  8.9       Latest Ref Rng & Units 12/01/2022    3:55 AM 11/26/2022   10:19 AM 03/10/2022   11:25 AM  CBC  WBC 4.0 - 10.5 K/uL 11.1  6.8  5.3   Hemoglobin 13.0 - 17.0  g/dL 16.1  09.6  04.5   Hematocrit 39.0 - 52.0 % 36.9  44.4  43.5   Platelets 150 - 400 K/uL 158  179  183    Lab Results  Component Value Date   HGBA1C 6.4 (H) 11/26/2022    External Labs:  Labs on Care Everywhere from PCP 06/22/2018 2025:  Vitamin D44.0, total cholesterol 151, triglycerides 249, HDL 40, LDL 71.  TSH normal at 1.16.  A1c 6.7%.  BUN 25, creatinine 1.01, EGFR 74 mL, potassium 4.9.  Hb 14.8/HCT 44.4, platelets 187, normal indicis.  Review of Systems  Cardiovascular:  Negative for chest pain, dyspnea on exertion and leg swelling.   Physical Exam:   VS:  BP 136/78 (BP Location: Left Arm, Patient Position: Sitting, Cuff Size: Large)   Pulse 71   Resp 16   Ht 5\' 11"  (1.803  m)   Wt 206 lb (93.4 kg)   SpO2 98%   BMI 28.73 kg/m    Wt Readings from Last 3 Encounters:  07/26/23 206 lb (93.4 kg)  11/30/22 201 lb (91.2 kg)  11/26/22 201 lb (91.2 kg)    Physical Exam Neck:     Vascular: No carotid bruit or JVD.  Cardiovascular:     Rate and Rhythm: Normal rate and regular rhythm.     Pulses: Intact distal pulses.     Heart sounds: Normal heart sounds. No murmur heard.    No gallop.  Pulmonary:     Effort: Pulmonary effort is normal.     Breath sounds: Normal breath sounds.  Abdominal:     General: Bowel sounds are normal.     Palpations: Abdomen is soft.  Musculoskeletal:     Right lower leg: No edema.     Left lower leg: No edema.    Studies Reviewed: .    MYOCARDIAL PERFUSION IMAGING 07/29/2015   Narrative  Nuclear stress EF: 54%.  There was no ST segment deviation noted during stress.  Defect 1: There is a medium defect of severe severity present in the basal inferior, mid inferior, apical inferior and apex location.  Findings consistent with prior myocardial infarction with a small amount of peri-infarct ischemia.  This is a low risk study.  The left ventricular ejection fraction is mildly decreased (45-54%).  ECHOCARDIOGRAM COMPLETE 12/15/2021  1. Left ventricular ejection fraction, by estimation, is 55%. The left ventricle has normal function. The left ventricle has no regional wall motion abnormalities. The left ventricular internal cavity size was mildly dilated. Left ventricular diastolic parameters were normal. 2. Right ventricular systolic function is normal. The right ventricular size is normal. There is normal pulmonary artery systolic pressure. 3. The mitral valve is abnormal. Mild mitral valve regurgitation. No evidence of mitral stenosis. 4. The aortic valve is tricuspid. Aortic valve regurgitation is not visualized. No aortic stenosis is present. 5. The inferior vena cava is normal in size with greater than 50% respiratory  variability, suggesting right atrial pressure of 3 mmHg.  LONG TERM MONITOR (3-14 DAYS) 03/08/2022   Narrative   NSR with average HR 70 bpm   Four nonsustained SVT episodes with longest 24 seconds, and fastest 174 bpm.   PAC and PVC burden < 1%   No atrial fibrillation  EKG:    EKG Interpretation Date/Time:  Tuesday July 26 2023 15:20:20 EDT Ventricular Rate:  72 PR Interval:  180 QRS Duration:  70 QT Interval:  382 QTC Calculation: 418 R Axis:   42  Text Interpretation: EKG 06/28/2023:  Normal sinus rhythm at rate of 72 bpm, normal EKG.  Compared to 11/26/2022, no change. Confirmed by Delrae Rend [phone]) on 07/26/2023 3:36:08 PM    Medications and allergies    Allergies  Allergen Reactions   Penicillins Swelling    Tolerated Ancef 12/01/22   Ambien [Zolpidem Tartrate] Other (See Comments)    Causes hallucinations.    Codeine Other (See Comments)    hallucinations    Lisinopril Cough     Current Outpatient Medications:    aspirin EC 81 MG tablet, Take 81 mg by mouth daily. Swallow whole., Disp: , Rfl:    atorvastatin (LIPITOR) 10 MG tablet, TAKE 1 TABLET DAILY, Disp: 90 tablet, Rfl: 0   azelastine (ASTELIN) 0.1 % nasal spray, Place 1 spray into both nostrils as needed for rhinitis or allergies. Use in each nostril as directed, Disp: , Rfl:    cholecalciferol (VITAMIN D) 1000 UNITS tablet, Take 1,000 Units by mouth daily., Disp: , Rfl:    Coenzyme Q10 100 MG capsule, Take 100 mg by mouth daily., Disp: , Rfl:    fenofibrate (TRICOR) 48 MG tablet, Take 1 tablet (48 mg total) by mouth daily., Disp: 90 tablet, Rfl: 3   fexofenadine (ALLEGRA) 180 MG tablet, Take 180 mg by mouth daily., Disp: , Rfl:    magnesium gluconate (MAGONATE) 500 MG tablet, Take 500 mg by mouth 2 (two) times daily. , Disp: , Rfl:    metoprolol succinate (TOPROL-XL) 50 MG 24 hr tablet, Take 50 mg by mouth in the morning, at noon, in the evening, and at bedtime. Take with or immediately following a meal.,  Disp: , Rfl:    Multiple Vitamin (MULTIVITAMIN WITH MINERALS) TABS, Take 1 tablet by mouth daily., Disp: , Rfl:    nitroGLYCERIN (NITROSTAT) 0.4 MG SL tablet, Place 1 tablet (0.4 mg total) under the tongue every 5 (five) minutes as needed for chest pain., Disp: 25 tablet, Rfl: 3   Omega-3 Fatty Acids (FISH OIL) 1200 MG CAPS, Take 1 capsule by mouth daily at 12 noon., Disp: , Rfl:    sennosides-docusate sodium (SENOKOT-S) 8.6-50 MG tablet, Take 2 tablets by mouth daily., Disp: 30 tablet, Rfl: 1   telmisartan (MICARDIS) 80 MG tablet, TAKE 1 TABLET EVERY EVENING, Disp: 90 tablet, Rfl: 0   vitamin C (ASCORBIC ACID) 500 MG tablet, Take 500 mg by mouth daily. , Disp: , Rfl:    ASSESSMENT AND PLAN: .      ICD-10-CM   1. Coronary artery disease involving native coronary artery of native heart without angina pectoris  I25.10 EKG 12-Lead    2. Essential hypertension  I10     3. Mixed hypercholesterolemia and hypertriglyceridemia  E78.2 fenofibrate (TRICOR) 48 MG tablet    4. H/O right nephrectomy 2009-10 for renal cell cancer  Z90.5       Meds ordered this encounter  Medications   fenofibrate (TRICOR) 48 MG tablet    Sig: Take 1 tablet (48 mg total) by mouth daily.    Dispense:  90 tablet    Refill:  3    Medications Discontinued During This Encounter  Medication Reason   ondansetron (ZOFRAN) 4 MG tablet    traMADol (ULTRAM) 50 MG tablet    aspirin EC 325 MG tablet Dose change   metoprolol tartrate (LOPRESSOR) 25 MG tablet      Assessment and Plan    Coronary Artery Disease   He has a history of coronary artery disease with a stent placed in 1995.  He experiences mild exertional dyspnea, which is long-standing, but no current angina or chest pain. There are no signs of heart failure, and his blood pressure is well-controlled with current medications. A stress test is not needed due to the absence of chest pain and his ability to exercise regularly without symptoms. Continue metoprolol  heart rate 25 mg twice daily in the evening and telmisartan 80 mg once daily.  Atrial Fibrillation   Atrial fibrillation was diagnosed approximately 15 years ago. Xarelto was discontinued in December 2023 due to no episodes for 8-10 years and patient preference. He is currently on aspirin 81 mg daily. Continue aspirin 81 mg once daily.  Hypertriglyceridemia   Triglycerides are elevated at 249 mg/dL, though LDL is at target with atorvastatin. He is advised to reduce starch intake to improve triglyceride levels. Fenofibrate is recommended to lower triglycerides, with informed consent obtained regarding its mechanism and benefits. Start fenofibrate 48 mg once daily and continue atorvastatin 10 mg once daily. Advise reducing intake of starches such as bread, pasta, rice, and potatoes.  Diabetes Mellitus Type 2   Diabetes is managed with dietary modifications, and A1c is 6.7%. Regular blood glucose monitoring is ongoing. High triglycerides indicate a need for stricter dietary control. Continue current dietary management and monitoring of blood glucose levels.  Peripheral Neuropathy   Peripheral neuropathy is likely secondary to past immunotherapy for kidney cancer, causing numbness from knees to feet and affecting mobility and exercise capacity. Approximately 10% of patients on similar immunotherapy experience neuropathy. Encourage continued use of a recumbent bike and other non-weight-bearing exercises.  Chronic Kidney Disease (Post-Nephrectomy)   He is status post right nephrectomy for kidney cancer approximately 14 years ago, with no evidence of metastatic disease on a recent CT scan. eGFR is 74 mL/min. He continues annual follow-up with an oncologist in [place]. Continue annual follow-up with the oncologist in [place].  Follow-up   Follow-up with a cardiologist is scheduled in one year. He has an upcoming appointment with Dr. Julious Payer for memory concerns. Schedule follow-up with  cardiology in one year and attend the appointment with Dr. Julious Payer on August 11, 2023.       Signed,  Yates Decamp, MD, [facility] 07/26/2023, 3:54 PM [facility] [address] [place], [place] [address] Phone: [phone]. Fax:  [phone]

## 2023-07-26 ENCOUNTER — Ambulatory Visit: Payer: Medicare HMO | Attending: Cardiology | Admitting: Cardiology

## 2023-07-26 ENCOUNTER — Encounter: Payer: Self-pay | Admitting: Cardiology

## 2023-07-26 VITALS — BP 136/78 | HR 71 | Resp 16 | Ht 71.0 in | Wt 206.0 lb

## 2023-07-26 DIAGNOSIS — I10 Essential (primary) hypertension: Secondary | ICD-10-CM | POA: Diagnosis not present

## 2023-07-26 DIAGNOSIS — E782 Mixed hyperlipidemia: Secondary | ICD-10-CM | POA: Diagnosis not present

## 2023-07-26 DIAGNOSIS — Z905 Acquired absence of kidney: Secondary | ICD-10-CM

## 2023-07-26 DIAGNOSIS — I2510 Atherosclerotic heart disease of native coronary artery without angina pectoris: Secondary | ICD-10-CM | POA: Diagnosis not present

## 2023-07-26 MED ORDER — FENOFIBRATE 48 MG PO TABS: 48.0000 mg | ORAL_TABLET | Freq: Every day | ORAL | 3 refills | Status: DC

## 2023-07-26 NOTE — Patient Instructions (Signed)
 Medication Instructions:  Your physician has recommended you make the following change in your medication: Start fenofibrate 48 mg by mouth daily   *If you need a refill on your cardiac medications before your next appointment, please call your pharmacy*   Lab Work: none If you have labs (blood work) drawn today and your tests are completely normal, you will receive your results only by: MyChart Message (if you have MyChart) OR A paper copy in the mail If you have any lab test that is abnormal or we need to change your treatment, we will call you to review the results.   Testing/Procedures: none   Follow-Up: At [facility], you and your health needs are our priority.  As part of our continuing mission to provide you with exceptional heart care, we have created designated Provider Care Teams.  These Care Teams include your primary Cardiologist (physician) and Advanced Practice Providers (APPs -  Physician Assistants and Nurse Practitioners) who all work together to provide you with the care you need, when you need it.  We recommend signing up for the patient portal called "MyChart".  Sign up information is provided on this After Visit Summary.  MyChart is used to connect with patients for Virtual Visits (Telemedicine).  Patients are able to view lab/test results, encounter notes, upcoming appointments, etc.  Non-urgent messages can be sent to your provider as well.   To learn more about what you can do with MyChart, go to [email or web address].    Your next appointment:   12 month(s)  Provider:   Yates Decamp, MD     Other Instructions    1st Floor: - Lobby - Registration  - Pharmacy  - Lab - Cafe  2nd Floor: - PV Lab - Diagnostic Testing (echo, CT, nuclear med)  3rd Floor: - Vacant  4th Floor: - TCTS (cardiothoracic surgery) - AFib Clinic - Structural Heart Clinic - Vascular Surgery  - Vascular Ultrasound  5th Floor: - HeartCare Cardiology (general and  EP) - Clinical Pharmacy for coumadin, hypertension, lipid, weight-loss medications, and med management appointments    Valet parking services will be available as well.

## 2023-07-27 DIAGNOSIS — C641 Malignant neoplasm of right kidney, except renal pelvis: Secondary | ICD-10-CM | POA: Diagnosis not present

## 2023-07-27 DIAGNOSIS — C649 Malignant neoplasm of unspecified kidney, except renal pelvis: Secondary | ICD-10-CM | POA: Diagnosis not present

## 2023-08-11 DIAGNOSIS — Z711 Person with feared health complaint in whom no diagnosis is made: Secondary | ICD-10-CM | POA: Diagnosis not present

## 2023-08-11 DIAGNOSIS — R2681 Unsteadiness on feet: Secondary | ICD-10-CM | POA: Diagnosis not present

## 2023-08-11 DIAGNOSIS — G609 Hereditary and idiopathic neuropathy, unspecified: Secondary | ICD-10-CM | POA: Diagnosis not present

## 2023-08-11 DIAGNOSIS — E1122 Type 2 diabetes mellitus with diabetic chronic kidney disease: Secondary | ICD-10-CM | POA: Diagnosis not present

## 2023-08-11 DIAGNOSIS — I10 Essential (primary) hypertension: Secondary | ICD-10-CM | POA: Diagnosis not present

## 2023-08-11 DIAGNOSIS — E1142 Type 2 diabetes mellitus with diabetic polyneuropathy: Secondary | ICD-10-CM | POA: Diagnosis not present

## 2023-10-05 ENCOUNTER — Inpatient Hospital Stay (HOSPITAL_COMMUNITY)
Admission: EM | Admit: 2023-10-05 | Discharge: 2023-10-07 | DRG: 310 | Disposition: A | Discharge status: Home / Self Care | Attending: Cardiology | Admitting: Cardiology

## 2023-10-05 ENCOUNTER — Other Ambulatory Visit: Payer: Self-pay

## 2023-10-05 ENCOUNTER — Emergency Department (HOSPITAL_COMMUNITY)

## 2023-10-05 DIAGNOSIS — Z87891 Personal history of nicotine dependence: Secondary | ICD-10-CM

## 2023-10-05 DIAGNOSIS — R4182 Altered mental status, unspecified: Secondary | ICD-10-CM | POA: Diagnosis not present

## 2023-10-05 DIAGNOSIS — Z96611 Presence of right artificial shoulder joint: Secondary | ICD-10-CM | POA: Diagnosis not present

## 2023-10-05 DIAGNOSIS — I672 Cerebral atherosclerosis: Secondary | ICD-10-CM | POA: Diagnosis not present

## 2023-10-05 DIAGNOSIS — I252 Old myocardial infarction: Secondary | ICD-10-CM

## 2023-10-05 DIAGNOSIS — Z96653 Presence of artificial knee joint, bilateral: Secondary | ICD-10-CM | POA: Diagnosis present

## 2023-10-05 DIAGNOSIS — K219 Gastro-esophageal reflux disease without esophagitis: Secondary | ICD-10-CM | POA: Diagnosis present

## 2023-10-05 DIAGNOSIS — Z96641 Presence of right artificial hip joint: Secondary | ICD-10-CM | POA: Diagnosis not present

## 2023-10-05 DIAGNOSIS — Z7901 Long term (current) use of anticoagulants: Secondary | ICD-10-CM | POA: Diagnosis not present

## 2023-10-05 DIAGNOSIS — Z8249 Family history of ischemic heart disease and other diseases of the circulatory system: Secondary | ICD-10-CM

## 2023-10-05 DIAGNOSIS — H1130 Conjunctival hemorrhage, unspecified eye: Secondary | ICD-10-CM | POA: Diagnosis not present

## 2023-10-05 DIAGNOSIS — Z905 Acquired absence of kidney: Secondary | ICD-10-CM | POA: Diagnosis not present

## 2023-10-05 DIAGNOSIS — R55 Syncope and collapse: Principal | ICD-10-CM | POA: Diagnosis present

## 2023-10-05 DIAGNOSIS — I480 Paroxysmal atrial fibrillation: Principal | ICD-10-CM | POA: Diagnosis present

## 2023-10-05 DIAGNOSIS — E785 Hyperlipidemia, unspecified: Secondary | ICD-10-CM | POA: Diagnosis present

## 2023-10-05 DIAGNOSIS — Z7982 Long term (current) use of aspirin: Secondary | ICD-10-CM

## 2023-10-05 DIAGNOSIS — I2510 Atherosclerotic heart disease of native coronary artery without angina pectoris: Secondary | ICD-10-CM | POA: Diagnosis not present

## 2023-10-05 DIAGNOSIS — Z79899 Other long term (current) drug therapy: Secondary | ICD-10-CM | POA: Diagnosis not present

## 2023-10-05 DIAGNOSIS — I6782 Cerebral ischemia: Secondary | ICD-10-CM | POA: Diagnosis not present

## 2023-10-05 DIAGNOSIS — Z85528 Personal history of other malignant neoplasm of kidney: Secondary | ICD-10-CM

## 2023-10-05 DIAGNOSIS — Z955 Presence of coronary angioplasty implant and graft: Secondary | ICD-10-CM

## 2023-10-05 DIAGNOSIS — R001 Bradycardia, unspecified: Secondary | ICD-10-CM | POA: Diagnosis not present

## 2023-10-05 DIAGNOSIS — I4891 Unspecified atrial fibrillation: Secondary | ICD-10-CM

## 2023-10-05 DIAGNOSIS — I10 Essential (primary) hypertension: Secondary | ICD-10-CM | POA: Diagnosis present

## 2023-10-05 DIAGNOSIS — E1142 Type 2 diabetes mellitus with diabetic polyneuropathy: Secondary | ICD-10-CM | POA: Diagnosis not present

## 2023-10-05 DIAGNOSIS — R69 Illness, unspecified: Secondary | ICD-10-CM | POA: Diagnosis not present

## 2023-10-05 LAB — CBC WITH DIFFERENTIAL/PLATELET
Abs Immature Granulocytes: 0.03 10*3/uL (ref 0.00–0.07)
Basophils Absolute: 0 10*3/uL (ref 0.0–0.1)
Basophils Relative: 1 %
Eosinophils Absolute: 0.1 10*3/uL (ref 0.0–0.5)
Eosinophils Relative: 1 %
HCT: 45.4 % (ref 39.0–52.0)
Hemoglobin: 15.2 g/dL (ref 13.0–17.0)
Immature Granulocytes: 0 %
Lymphocytes Relative: 14 %
Lymphs Abs: 1.2 10*3/uL (ref 0.7–4.0)
MCH: 30.3 pg (ref 26.0–34.0)
MCHC: 33.5 g/dL (ref 30.0–36.0)
MCV: 90.6 fL (ref 80.0–100.0)
Monocytes Absolute: 0.5 10*3/uL (ref 0.1–1.0)
Monocytes Relative: 6 %
Neutro Abs: 6.6 10*3/uL (ref 1.7–7.7)
Neutrophils Relative %: 78 %
Platelets: 168 10*3/uL (ref 150–400)
RBC: 5.01 MIL/uL (ref 4.22–5.81)
RDW: 13.4 % (ref 11.5–15.5)
WBC: 8.4 10*3/uL (ref 4.0–10.5)
nRBC: 0 % (ref 0.0–0.2)

## 2023-10-05 LAB — COMPREHENSIVE METABOLIC PANEL WITH GFR
ALT: 28 U/L (ref 0–44)
AST: 29 U/L (ref 15–41)
Albumin: 3.8 g/dL (ref 3.5–5.0)
Alkaline Phosphatase: 55 U/L (ref 38–126)
Anion gap: 12 (ref 5–15)
BUN: 27 mg/dL — ABNORMAL HIGH (ref 8–23)
CO2: 21 mmol/L — ABNORMAL LOW (ref 22–32)
Calcium: 9.1 mg/dL (ref 8.9–10.3)
Chloride: 105 mmol/L (ref 98–111)
Creatinine, Ser: 1.24 mg/dL (ref 0.61–1.24)
GFR, Estimated: 58 mL/min — ABNORMAL LOW (ref >60)
Glucose, Bld: 148 mg/dL — ABNORMAL HIGH (ref 70–99)
Potassium: 4.8 mmol/L (ref 3.5–5.1)
Sodium: 138 mmol/L (ref 135–145)
Total Bilirubin: 1.1 mg/dL (ref 0.0–1.2)
Total Protein: 6.3 g/dL — ABNORMAL LOW (ref 6.5–8.1)

## 2023-10-05 LAB — D-DIMER, QUANTITATIVE: D-Dimer, Quant: 0.42 ug{FEU}/mL (ref 0.00–0.50)

## 2023-10-05 LAB — TROPONIN I (HIGH SENSITIVITY)
Troponin I (High Sensitivity): 7 ng/L (ref <18)
Troponin I (High Sensitivity): 7 ng/L (ref <18)

## 2023-10-05 LAB — URINALYSIS, ROUTINE W REFLEX MICROSCOPIC
Bilirubin Urine: NEGATIVE
Glucose, UA: NEGATIVE mg/dL
Hgb urine dipstick: NEGATIVE
Ketones, ur: NEGATIVE mg/dL
Leukocytes,Ua: NEGATIVE
Nitrite: NEGATIVE
Protein, ur: 100 mg/dL — AB
Specific Gravity, Urine: 1.025 (ref 1.005–1.030)
pH: 6.5 (ref 5.0–8.0)

## 2023-10-05 LAB — URINALYSIS, MICROSCOPIC (REFLEX): Bacteria, UA: NONE SEEN

## 2023-10-05 LAB — PROTIME-INR
INR: 1.1 (ref 0.8–1.2)
Prothrombin Time: 13.9 s (ref 11.4–15.2)

## 2023-10-05 LAB — CBG MONITORING, ED: Glucose-Capillary: 141 mg/dL — ABNORMAL HIGH (ref 70–99)

## 2023-10-05 LAB — BRAIN NATRIURETIC PEPTIDE: B Natriuretic Peptide: 413.1 pg/mL — ABNORMAL HIGH (ref 0.0–100.0)

## 2023-10-05 MED ORDER — ONDANSETRON HCL 4 MG/2ML IJ SOLN: 4.0000 mg | Freq: Four times a day (QID) | INTRAMUSCULAR | Status: DC | PRN

## 2023-10-05 MED ORDER — HEPARIN BOLUS VIA INFUSION
4000.0000 [IU] | Freq: Once | INTRAVENOUS | Status: DC
  Filled 2023-10-05: qty 4000

## 2023-10-05 MED ORDER — RIVAROXABAN 20 MG PO TABS
20.0000 mg | ORAL_TABLET | Freq: Every day | ORAL | Status: DC
  Administered 2023-10-05 – 2023-10-06 (×2): 20 mg via ORAL
  Filled 2023-10-05 (×2): qty 2

## 2023-10-05 MED ORDER — HEPARIN (PORCINE) 25000 UT/250ML-% IV SOLN
1350.0000 [IU]/h | INTRAVENOUS | Status: DC
  Filled 2023-10-05: qty 250

## 2023-10-05 MED ORDER — ACETAMINOPHEN 325 MG PO TABS: 650.0000 mg | ORAL_TABLET | ORAL | Status: DC | PRN

## 2023-10-05 NOTE — ED Notes (Signed)
 Resnt BMP and CBC

## 2023-10-05 NOTE — ED Notes (Signed)
 Provided with sandwich bag and ginger ale.

## 2023-10-05 NOTE — ED Provider Notes (Signed)
 [place] EMERGENCY DEPARTMENT AT [facility] Provider Note   CSN: [id number] Arrival date & time: 10/05/23  1604     History  Chief Complaint  Patient presents with   Loss of Consciousness    Donald Gray is a 83 y.o. male.   Loss of Consciousness Associated symptoms: diaphoresis and nausea      83 year old male with medical history significant for HTN, CAD status post MI with stent placement in 2000, HLD, GERD, diverticulosis, paroxysmal atrial fibrillation previously treated with Xarelto , taken off due to no episodes of atrial fibrillation in years per the patient, DM2, renal cell carcinoma status post right-sided nephrectomy in 2011 presenting to the emergency department with a syncopal episode.  The patient states that he was outside earlier today with witnessed LOC by his wife.  Per EMS the patient's wife could not arouse the patient to a sternal rub.  He was found to be diaphoretic.  He was administered 4 mg of Zofran .  He arrives the emergency department GCS 15, ABC intact.  Denies any chest pain, shortness of breath, abdominal pain.  Nausea has resolved as has his diaphoresis.  Home Medications Prior to Admission medications   Medication Sig Start Date End Date Taking? Authorizing Provider  aspirin  EC 81 MG tablet Take 81 mg by mouth in the morning. Swallow whole.   Yes [provider]  atorvastatin  (LIPITOR) 10 MG tablet TAKE 1 TABLET DAILY Patient taking differently: Take 10 mg by mouth at bedtime. 05/10/23  Yes Lucendia Rusk, MD  azelastine  (ASTELIN ) 0.1 % nasal spray Place 1 spray into both nostrils as needed for rhinitis or allergies. Use in each nostril as directed   Yes [provider]  cholecalciferol  (VITAMIN D) 1000 UNITS tablet Take 1,000 Units by mouth at bedtime.   Yes [provider]  Coenzyme Q10 100 MG capsule Take 100 mg by mouth at bedtime.   Yes [provider]  fexofenadine (ALLEGRA) 180 MG tablet  Take 180 mg by mouth in the morning.   Yes [provider]  magnesium  gluconate (MAGONATE) 500 MG tablet Take 500 mg by mouth 2 (two) times daily.    Yes [provider]  metoprolol  tartrate (LOPRESSOR ) 25 MG tablet Take 25 mg by mouth 2 (two) times daily.   Yes [provider]  Multiple Vitamin (MULTIVITAMIN WITH MINERALS) TABS Take 1 tablet by mouth in the morning.   Yes [provider]  nitroGLYCERIN  (NITROSTAT ) 0.4 MG SL tablet Place 1 tablet (0.4 mg total) under the tongue every 5 (five) minutes as needed for chest pain. 11/25/21  Yes Conte, Tessa N, PA-C  Omega-3 Fatty Acids (FISH OIL) 1200 MG CAPS Take 2 capsules by mouth at bedtime.   Yes [provider]  telmisartan  (MICARDIS ) 80 MG tablet TAKE 1 TABLET EVERY EVENING Patient taking differently: Take 80 mg by mouth at bedtime. 05/10/23  Yes Lucendia Rusk, MD  vitamin C  (ASCORBIC ACID ) 500 MG tablet Take 500 mg by mouth in the morning.   Yes [provider]  clindamycin  (CLEOCIN ) 150 MG capsule Take 600 mg by mouth as needed (for teeth cleaning). Patient not taking: Reported on 10/05/2023 09/27/23   [provider]      Allergies    Penicillins, Ambien  [zolpidem  tartrate], Codeine, and Lisinopril     Review of Systems   Review of Systems  Constitutional:  Positive for diaphoresis.  Cardiovascular:  Positive for syncope.  Gastrointestinal:  Positive for nausea.  Neurological:  Positive for syncope.  All other systems reviewed and are negative.   Physical Exam Updated Vital Signs BP 118/72 (BP Location: Right Arm)   Pulse 84   Temp 97.6 F (36.4 C) (Oral)   Resp 16   SpO2 100%  Physical Exam Vitals and nursing note reviewed.  Constitutional:      General: He is not in acute distress.    Appearance: He is well-developed.  HENT:     Head: Normocephalic and atraumatic.  Eyes:     Conjunctiva/sclera: Conjunctivae normal.  Cardiovascular:     Rate and Rhythm:  Normal rate. Rhythm irregular.     Pulses: Normal pulses.     Heart sounds: No murmur heard. Pulmonary:     Effort: Pulmonary effort is normal. No respiratory distress.     Breath sounds: Normal breath sounds.  Abdominal:     Palpations: Abdomen is soft.     Tenderness: There is no abdominal tenderness.  Musculoskeletal:        General: No swelling.     Cervical back: Neck supple.  Skin:    General: Skin is warm and dry.     Capillary Refill: Capillary refill takes less than 2 seconds.  Neurological:     Mental Status: He is alert.     Comments: MENTAL STATUS EXAM:    Orientation: Alert and oriented to person, place and time.  Memory: Cooperative, follows commands well.  Language: Speech is clear and language is normal.   CRANIAL NERVES:    CN 2 (Optic): Visual fields intact to confrontation.  CN 3,4,6 (EOM): Pupils equal and reactive to light. Full extraocular eye movement without nystagmus.  CN 5 (Trigeminal): Facial sensation is normal, no weakness of masticatory muscles.  CN 7 (Facial): No facial weakness or asymmetry.  CN 8 (Auditory): Auditory acuity grossly normal.  CN 9,10 (Glossophar): The uvula is midline, the palate elevates symmetrically.  CN 11 (spinal access): Normal sternocleidomastoid and trapezius strength.  CN 12 (Hypoglossal): The tongue is midline. No atrophy or fasciculations.Aaron Aas   MOTOR:  Muscle Strength: 5/5RUE, 5/5LUE, 5/5RLE, 5/5LLE.   COORDINATION:   No tremor.   SENSATION:   Intact to light touch all four extremities.     Psychiatric:        Mood and Affect: Mood normal.     ED Results / Procedures / Treatments   Labs (all labs ordered are listed, but only abnormal results are displayed) Labs Reviewed  COMPREHENSIVE METABOLIC PANEL WITH GFR - Abnormal; Notable for the following components:      Result Value   CO2 21 (*)    Glucose, Bld 148 (*)    BUN 27 (*)    Total Protein 6.3 (*)    GFR, Estimated 58 (*)    All other components within  normal limits  URINALYSIS, ROUTINE W REFLEX MICROSCOPIC - Abnormal; Notable for the following components:   Protein, ur 100 (*)    All other components within normal limits  BRAIN NATRIURETIC PEPTIDE - Abnormal; Notable for the following components:   B Natriuretic Peptide 413.1 (*)    All other components within normal limits  CBG MONITORING, ED - Abnormal; Notable for the following components:   Glucose-Capillary 141 (*)    All other components within normal limits  PROTIME-INR  D-DIMER, QUANTITATIVE  CBC WITH DIFFERENTIAL/PLATELET  URINALYSIS, MICROSCOPIC (REFLEX)  CBC WITH DIFFERENTIAL/PLATELET  HEPARIN  LEVEL (UNFRACTIONATED)  CBC  TROPONIN I (HIGH SENSITIVITY)  TROPONIN I (HIGH SENSITIVITY)  EKG EKG Interpretation Date/Time:  Wednesday October 05 2023 16:09:08 EDT Ventricular Rate:  76 PR Interval:    QRS Duration:  81 QT Interval:  407 QTC Calculation: 458 R Axis:   69  Text Interpretation: Atrial fibrillation Confirmed by Rosealee Concha ([id number]) on 10/05/2023 6:00:50 PM  Radiology DG Chest Port 1 View Result Date: 10/05/2023 CLINICAL DATA:  Syncope. EXAM: PORTABLE CHEST 1 VIEW COMPARISON:  Chest radiograph dated 12/28/2013 FINDINGS: No focal consolidation, pleural effusion, pneumothorax. The cardiac silhouette is within limits. No acute osseous pathology. IMPRESSION: No active disease. Electronically Signed   By: Angus Bark M.D.   On: 10/05/2023 18:03   CT HEAD WO CONTRAST ( ) Result Date: 10/05/2023 CLINICAL DATA:  Mental status change, unknown cause Syncope/presyncope, cerebrovascular cause suspected4 EXAM: CT HEAD WITHOUT CONTRAST TECHNIQUE: Contiguous axial images were obtained from the base of the skull through the vertex without intravenous contrast. RADIATION DOSE REDUCTION: This exam was performed according to the departmental dose-optimization program which includes automated exposure control, adjustment of the mA and/or kV according to patient size and/or use of  iterative reconstruction technique. COMPARISON:  None Available. FINDINGS: Brain: No intracranial hemorrhage, mass effect, or midline shift. No hydrocephalus. Age related atrophy. The basilar cisterns are patent. Moderate periventricular and deep white matter hypodensity typical of chronic small vessel ischemia. No evidence of territorial infarct or acute ischemia. No extra-axial or intracranial fluid collection. Vascular: Atherosclerosis of skull base vasculature. Slight generalized increase of intracranial vasculature, but no discrete hyperdense vessel. Skull: No fracture or focal lesion. Sinuses/Orbits: Paranasal sinuses and mastoid air cells are clear. The visualized orbits are unremarkable. Other: None. IMPRESSION: 1. No acute intracranial abnormality. 2. Age related atrophy and chronic small vessel ischemia. Electronically Signed   By: Chadwick Colonel M.D.   On: 10/05/2023 17:08    Procedures Procedures    Medications Ordered in ED Medications  heparin  bolus via infusion 4,000 Units (has no administration in time range)  heparin  ADULT infusion 100 units/mL (25000 units/250mL) (has no administration in time range)    ED Course/ Medical Decision Making/ A&P         CHA2DS2-VASc Score: 4                        Medical Decision Making Amount and/or Complexity of Data Reviewed Labs: ordered. Radiology: ordered.  Risk Prescription drug management. Decision regarding hospitalization.     83 year old male with medical history significant for HTN, CAD status post MI with stent placement in 2000, HLD, GERD, diverticulosis, paroxysmal atrial fibrillation previously treated with Xarelto , taken off due to no episodes of atrial fibrillation in years per the patient, DM2, renal cell carcinoma status post right-sided nephrectomy in 2011 presenting to the emergency department with a syncopal episode.  The patient states that he was outside earlier today with witnessed LOC by his wife.  Per EMS the  patient's wife could not arouse the patient to a sternal rub.  He was found to be diaphoretic.  He was administered 4 mg of Zofran .  He arrives the emergency department GCS 15, ABC intact.  Denies any chest pain, shortness of breath, abdominal pain.  Nausea has resolved as has his diaphoresis.  On arrival, the patient was afebrile, not tachycardic or tachypneic, BP 105/75, saturating 99% on room air.  On cardiac telemetry, rate controlled atrial fibrillation was noted.  This is new onset as the patient states he has not had atrial fibrillation for years.  He is  no longer on anticoagulation.  Unclear duration of his atrial fibrillation.  EKG: Atrial fibrillation, ventricular rate 76, no acute ischemic changes noted.  Chest x-ray performed revealed no active disease.  CT head was performed revealed no acute intracranial abnormality, age-related atrophy and chronic small vessel ischemia noted.  Screening laboratory evaluation revealed urinalysis negative for UTI, CMP with mildly low bicarb at 21, mild hyperglycemia 148, renal function normal, LFTs normal, INR normal, CBC without a leukocytosis or anemia, cardiac troponin 7, BNP nonspecifically elevated moderately to 413.  D-dimer was negative.  Patient was started on heparin  for anticoagulation in the setting of his atrial fibrillation.  His CHA2DS2-VASc score is a 4.  In in the setting of the patient's syncopal episode as well as recurrent atrial fibrillation, cardiology was consulted, discussed with on-call cardiology fellow Dr. Sanjuana Crutch who will see the patient in consultation.  After discussion with cardiology, they will admit to their service, plan for inpatient cardioversion.  Rather than initiate heparin , they recommended restarting Xarelto .  Patient subsequently admitted in stable condition.  Final Clinical Impression(s) / ED Diagnoses Final diagnoses:  Syncope, unspecified syncope type  Atrial fibrillation, unspecified type [facility])    Rx / DC  Orders ED Discharge Orders     None         Rosealee Concha, MD 10/05/23 2147

## 2023-10-05 NOTE — Consult Note (Signed)
 Cardiology Admission History and Physical:   Patient ID: Donald Gray MRN: [id number]; DOB: 16-Aug-1940   Admission date: 10/05/2023  Primary Care Provider: Benedetta Bradley, MD Primary Cardiologist: Knox Perl, MD  Primary Electrophysiologist:  None   Chief Complaint:  Syncopal Episode   Patient Profile:   Donald Gray is a 83 y.o. male with CAD, pafib, with his last DCCV in the 2000s no longer on anticoagulation, HTN  History of Present Illness:   Donald Gray presented for a syncopal episode. He states that he was sitting outside alone on a chair and all of a sudden felt nauseous and he lost consciousness while sitting in the chair. His wife went out to meet him and tried to wake him up.  According to her report, Donald Gray mouth was blue and he was unresponsive for several seconds. He regained consciousness on his own accord. No convulsing. No loss of bladder or bowel function. Upon arrival to the emergency department he was noted to be in atrial fibrillation. Although he does have a history of atrial fibrillation he has not been in atrial fibrillation for over 10 years. His anticoagulation was discontinued in December 2023. Cardiology consulted for atrial fibrillation.  While reviewing the EKG it appears there was a pause at the end of the EKG pointing to maybe a pause was the etiology of the syncopal episode.   Past Medical History:  Diagnosis Date   Anemia    Anticoagulated- to go to Xarelto  for a fib 10/07/2013   Arthritis    "fingers, toes" (10/05/2013)   Coronary artery disease    a. s/p MI with stent in 2000   Diverticulosis    Dysrhythmia    GERD (gastroesophageal reflux disease)    History of blood transfusion    "w/nephrectomy; w/shoulder replacement; w/knee replacement"   Hyperlipidemia    Hypertension    Left knee DJD    Myocardial infarction (HCC) 05/04/1995   Neuropathy    feet bilat    NSTEMI (non-ST elevated myocardial infarction),  type 2 due to demand ischemia 10/07/2013   PAF (paroxysmal atrial fibrillation) (HCC)    Pneumonia ~ 2007   again 3 months ago    Primary localized osteoarthritis of right knee    Renal cell carcinoma (HCC)    a. s/p right nephrectomy 08/14/09   Shortness of breath    a. related to chemo drugs   Tendonitis    currently on prednisone and wearing brace (left wrist)   Type II diabetes mellitus (HCC)    "treating w/diet and weight loss"    Past Surgical History:  Procedure Laterality Date   CARDIAC CATHETERIZATION  05/2009   CARDIOVERSION N/A 10/08/2013   Procedure: CARDIOVERSION;  Surgeon: Liza Riggers, MD;  Location: [facility] ENDOSCOPY;  Service: Cardiovascular;  Laterality: N/A;   CHEILECTOMY Right 04/13/2013   Procedure: Right Great Toe Cheilectomy, Debride Tendon and Abscess, Apply Stimulan Beads;  Surgeon: Timothy Ford, MD;  Location: [facility] OR;  Service: Orthopedics;  Laterality: Right;  Right Great Toe Cheilectomy, Debride Tendon and Abscess, Apply Stimulan Beads   colonscopy      CORONARY ANGIOPLASTY WITH STENT PLACEMENT  1997   at [facility] to RCA    INGUINAL HERNIA REPAIR  09/22/2011   Procedure: HERNIA REPAIR INGUINAL ADULT BILATERAL;  Surgeon: Kari Otto. Eli Grizzle, MD;  Location: [facility] ORS;  Service: General;  Laterality: Bilateral;   INGUINAL HERNIA REPAIR Bilateral 09/22/11   JOINT REPLACEMENT     KNEE ARTHROSCOPY  Right ~ 2011   KNEE ARTHROSCOPY Left 11/13/2012   Procedure: ARTHROSCOPY KNEE;  Surgeon: Genevie Kerns, MD;  Location: [facility] OR;  Service: Orthopedics;  Laterality: Left;   LUMBAR LAMINECTOMY/DECOMPRESSION MICRODISCECTOMY N/A 03/17/2022   Procedure: Laminectomy and Foraminotomy - Lumbar two-three, Lumbar three-four, Lumbar four-five;  Surgeon: Isadora Mar, MD;  Location: [facility] OR;  Service: Neurosurgery;  Laterality: N/A;   LUNG BIOPSY  2011   MASS EXCISION Right 04/12/2014   Procedure: MASS EXCISION DORSUM OF RIGHT FOOT  ;  Surgeon: Florencia Hunter, MD;  Location: [facility] ORS;  Service:  Orthopedics;  Laterality: Right;   NEPHRECTOMY Right 08/14/2009   ORCHIECTOMY  09/22/2011   Procedure: ORCHIECTOMY;  Surgeon: Trent Frizzle, MD;  Location: [facility] ORS;  Service: Urology;  Laterality: Left;  Left Testicular Exploration, Inguinal Approach,  Testicular    PILONIDAL CYST DRAINAGE     TEE WITHOUT CARDIOVERSION N/A 10/08/2013   Procedure: TRANSESOPHAGEAL ECHOCARDIOGRAM (TEE);  Surgeon: Liza Riggers, MD;  Location[facility]COPY;  Service: Cardiovascular;  Laterality: N/A;   TONSILLECTOMY AND ADENOIDECTOMY  1948   TOTAL HIP ARTHROPLASTY Right 11/30/2022   Procedure: RIGHT TOTAL HIP ARTHROPLASTY;  Surgeon: Osa Blase, MD;  Location: [facility] ORS;  Service: Orthopedics;  Laterality: Right;   TOTAL KNEE ARTHROPLASTY Left 11/13/2012   Procedure: TOTAL KNEE ARTHROPLASTY;  Surgeon: Genevie Kerns, MD;  Location: [facility] OR;  Service: Orthopedics;  Laterality: Left;   TOTAL KNEE ARTHROPLASTY Right 07/22/2014   Procedure: RIGHT TOTAL KNEE ARTHROPLASTY;  Surgeon: Elly Habermann, MD;  Location: [facility] OR;  Service: Orthopedics;  Laterality: Right;   TOTAL SHOULDER REPLACEMENT Right 1997     Medications Prior to Admission: Prior to Admission medications   Medication Sig Start Date End Date Taking? Authorizing Provider  aspirin  EC 81 MG tablet Take 81 mg by mouth in the morning. Swallow whole.   Yes [provider]  atorvastatin  (LIPITOR) 10 MG tablet TAKE 1 TABLET DAILY Patient taking differently: Take 10 mg by mouth at bedtime. 05/10/23  Yes Lucendia Rusk, MD  azelastine  (ASTELIN ) 0.1 % nasal spray Place 1 spray into both nostrils as needed for rhinitis or allergies. Use in each nostril as directed   Yes [provider]  cholecalciferol  (VITAMIN D) 1000 UNITS tablet Take 1,000 Units by mouth at bedtime.   Yes [provider]  Coenzyme Q10 100 MG capsule Take 100 mg by mouth at bedtime.   Yes [provider]  fexofenadine (ALLEGRA) 180 MG tablet Take 180 mg by  mouth in the morning.   Yes [provider]  magnesium  gluconate (MAGONATE) 500 MG tablet Take 500 mg by mouth 2 (two) times daily.    Yes [provider]  metoprolol  tartrate (LOPRESSOR ) 25 MG tablet Take 25 mg by mouth 2 (two) times daily.   Yes [provider]  Multiple Vitamin (MULTIVITAMIN WITH MINERALS) TABS Take 1 tablet by mouth in the morning.   Yes [provider]  nitroGLYCERIN  (NITROSTAT ) 0.4 MG SL tablet Place 1 tablet (0.4 mg total) under the tongue every 5 (five) minutes as needed for chest pain. 11/25/21  Yes Conte, Tessa N, PA-C  Omega-3 Fatty Acids (FISH OIL) 1200 MG CAPS Take 2 capsules by mouth at bedtime.   Yes [provider]  telmisartan  (MICARDIS ) 80 MG tablet TAKE 1 TABLET EVERY EVENING Patient taking differently: Take 80 mg by mouth at bedtime. 05/10/23  Yes Lucendia Rusk, MD  vitamin C  (ASCORBIC ACID ) 500 MG tablet  Take 500 mg by mouth in the morning.   Yes [provider]  clindamycin  (CLEOCIN ) 150 MG capsule Take 600 mg by mouth as needed (for teeth cleaning). Patient not taking: Reported on 10/05/2023 09/27/23   [provider]     Allergies:    Allergies  Allergen Reactions   Penicillins Swelling    Tolerated Ancef  12/01/22   Ambien  [Zolpidem  Tartrate] Other (See Comments)    Causes hallucinations.    Codeine Other (See Comments)    hallucinations    Lisinopril  Cough    Social History:   Social History   Socioeconomic History   Marital status: Married    Spouse name: Not on file   Number of children: Not on file   Years of education: Not on file   Highest education level: Not on file  Occupational History   Not on file  Tobacco Use   Smoking status: Former    Current packs/day: 0.00    Average packs/day: 1 pack/day for 2.0 years (2.0 ttl pk-yrs)    Types: Cigarettes    Start date: 05/04/1959    Quit date: 05/03/1961    Years since quitting: 62.4   Smokeless tobacco: Never  Vaping Use    Vaping status: Never Used  Substance and Sexual Activity   Alcohol  use: Yes    Alcohol /week: 1.0 standard drink of alcohol     Types: 1 Glasses of wine per week    Comment: occas glass of wine every 2 wks   Drug use: No   Sexual activity: Not Currently  Other Topics Concern   Not on file  Social History Narrative   Retired Charity fundraiser.   Lives with wife.   Social Drivers of Corporate investment banker Strain: Not on file  Food Insecurity: No Food Insecurity (11/30/2022)   Hunger Vital Sign    Worried About Running Out of Food in the Last Year: Never true    Ran Out of Food in the Last Year: Never true  Transportation Needs: No Transportation Needs (11/30/2022)   PRAPARE - Administrator, Civil Service (Medical): No    Lack of Transportation (Non-Medical): No  Physical Activity: Not on file  Stress: Not on file  Social Connections: Not on file  Intimate Partner Violence: Not At Risk (11/30/2022)   Humiliation, Afraid, Rape, and Kick questionnaire    Fear of Current or Ex-Partner: No    Emotionally Abused: No    Physically Abused: No    Sexually Abused: No    Family History:   The patient's family history includes Dementia in his mother; Healthy in his daughter; Heart disease in his father; Hypertension in his father; Skin cancer in his son; Thyroid  disease in his brother and mother.    Review of Systems: [y] = yes, [ ]  = no    General: Weight gain [ ] ; Weight loss [ ] ; Anorexia [ ] ; Fatigue [ ] ; Fever [ ] ; Chills [ ] ; Weakness [ ]   Cardiac: Chest pain/pressure [ ] ; Resting SOB [ ] ; Exertional SOB [ ] ; Orthopnea [ ] ; Pedal Edema [ ] ; Palpitations [ ] ; Syncope [ ] ; Presyncope [ ] ; Paroxysmal nocturnal dyspnea[ ]   Pulmonary: Cough [ ] ; Wheezing[ ] ; Hemoptysis[ ] ; Sputum [ ] ; Snoring [ ]   GI: Vomiting[ ] ; Dysphagia[ ] ; Melena[ ] ; Hematochezia [ ] ; Heartburn[ ] ; Abdominal pain [ ] ; Constipation [ ] ; Diarrhea [ ] ; BRBPR [ ]   GU: Hematuria[ ] ; Dysuria [ ] ; Nocturia[ ]    Vascular:  Pain in legs with walking [ ] ; Pain in feet with lying flat [ ] ; Non-healing sores [ ] ; Stroke [ ] ; TIA [ ] ; Slurred speech [ ] ;  Neuro: Headaches[ ] ; Vertigo[ ] ; Seizures[ ] ; Paresthesias[ ] ;Blurred vision [ ] ; Diplopia [ ] ; Vision changes [ ]   Ortho/Skin: Arthritis [ ] ; Joint pain [ ] ; Muscle pain [ ] ; Joint swelling [ ] ; Back Pain [ ] ; Rash [ ]   Psych: Depression[ ] ; Anxiety[ ]   Heme: Bleeding problems [ ] ; Clotting disorders [ ] ; Anemia [ ]   Endocrine: Diabetes [ ] ; Thyroid  dysfunction[ ]   Physical Exam/Data:   Vitals:   10/05/23 1800 10/05/23 1815 10/05/23 1845 10/05/23 2035  BP: 111/72 114/76 (!) 106/59 118/72  Pulse: 85 71 79 84  Resp: 17 14 14 16   Temp:    97.6 F (36.4 C)  TempSrc:    Oral  SpO2: 100% 98% 100% 100%   No intake or output data in the 24 hours ending 10/05/23 2107 There were no vitals filed for this visit. There is no height or weight on file to calculate BMI.  General:  Well nourished, well developed, in no acute distress HEENT: normal Neck: no JVD Cardiac:  normal S1, S2; RRR; no murmur  Lungs:  clear to auscultation bilaterally, no wheezing, rhonchi or rales  Abd: soft, nontender, no hepatomegaly  Ext: no edema Musculoskeletal:  No deformities, BUE and BLE strength normal and equal Skin: warm and dry    EKG:    EKG 10/05/2023 14:09- Afib with rates ~70 bpm at the end of the EKG strip there is a pause.   Relevant CV Studies: ECHO 12/2021  IMPRESSIONS     1. Left ventricular ejection fraction, by estimation, is 55%. The left  ventricle has normal function. The left ventricle has no regional wall  motion abnormalities. The left ventricular internal cavity size was mildly  dilated. Left ventricular diastolic  parameters were normal.   2. Right ventricular systolic function is normal. The right ventricular  size is normal. There is normal pulmonary artery systolic pressure.   3. The mitral valve is abnormal. Mild mitral valve  regurgitation. No  evidence of mitral stenosis.   4. The aortic valve is tricuspid. Aortic valve regurgitation is not  visualized. No aortic stenosis is present.   5. The inferior vena cava is normal in size with greater than 50%  respiratory variability, suggesting right atrial pressure of 3 mmHg.   Laboratory Data:  Chemistry Recent Labs  Lab 10/05/23 1638  NA 138  K 4.8  CL 105  CO2 21*  GLUCOSE 148*  BUN 27*  CREATININE 1.24  CALCIUM  9.1  GFRNONAA 58*  ANIONGAP 12    Recent Labs  Lab 10/05/23 1638  PROT 6.3*  ALBUMIN 3.8  AST 29  ALT 28  ALKPHOS 55  BILITOT 1.1   Hematology Recent Labs  Lab 10/05/23 1730  WBC 8.4  RBC 5.01  HGB 15.2  HCT 45.4  MCV 90.6  MCH 30.3  MCHC 33.5  RDW 13.4  PLT 168   Cardiac EnzymesNo results for input(s): "TROPONINI" in the last 168 hours. No results for input(s): "TROPIPOC" in the last 168 hours.  BNP Recent Labs  Lab 10/05/23 1730  BNP 413.1*    DDimer  Recent Labs  Lab 10/05/23 1638  DDIMER 0.42    Radiology/Studies:  DG Chest Port 1 View Result Date: 10/05/2023 CLINICAL DATA:  Syncope. EXAM: PORTABLE CHEST 1 VIEW COMPARISON:  Chest radiograph dated 12/28/2013  FINDINGS: No focal consolidation, pleural effusion, pneumothorax. The cardiac silhouette is within limits. No acute osseous pathology. IMPRESSION: No active disease. Electronically Signed   By: Angus Bark M.D.   On: 10/05/2023 18:03   CT HEAD WO CONTRAST ( ) Result Date: 10/05/2023 CLINICAL DATA:  Mental status change, unknown cause Syncope/presyncope, cerebrovascular cause suspected4 EXAM: CT HEAD WITHOUT CONTRAST TECHNIQUE: Contiguous axial images were obtained from the base of the skull through the vertex without intravenous contrast. RADIATION DOSE REDUCTION: This exam was performed according to the departmental dose-optimization program which includes automated exposure control, adjustment of the mA and/or kV according to patient size and/or use of  iterative reconstruction technique. COMPARISON:  None Available. FINDINGS: Brain: No intracranial hemorrhage, mass effect, or midline shift. No hydrocephalus. Age related atrophy. The basilar cisterns are patent. Moderate periventricular and deep white matter hypodensity typical of chronic small vessel ischemia. No evidence of territorial infarct or acute ischemia. No extra-axial or intracranial fluid collection. Vascular: Atherosclerosis of skull base vasculature. Slight generalized increase of intracranial vasculature, but no discrete hyperdense vessel. Skull: No fracture or focal lesion. Sinuses/Orbits: Paranasal sinuses and mastoid air cells are clear. The visualized orbits are unremarkable. Other: None. IMPRESSION: 1. No acute intracranial abnormality. 2. Age related atrophy and chronic small vessel ischemia. Electronically Signed   By: Chadwick Colonel M.D.   On: 10/05/2023 17:08    Assessment and Plan:  Donald Gray presents to the emergency room for syncope that based upon story alone sounds like cardiac syncope.   Syncope  Etiology of syncope is unclear, however it does appear he has had pauses  (as seen in EKG). He would benefit from the following: - ECHO in the morning to evaluate for new valvular pathology  - D dimer negative; low probability for PE - Resume rivaroxaban  20 mg nightly  - NPO for TEE/DCCV tonight    Severity of Illness: The appropriate patient status for this patient is INPATIENT. Inpatient status is judged to be reasonable and necessary in order to provide the required intensity of service to ensure the patient's safety. The patient's presenting symptoms, physical exam findings, and initial radiographic and laboratory data in the context of their chronic comorbidities is felt to place them at high risk for further clinical deterioration. Furthermore, it is not anticipated that the patient will be medically stable for discharge from the hospital within 2 midnights of  admission.   * I certify that at the point of admission it is my clinical judgment that the patient will require inpatient hospital care spanning beyond 2 midnights from the point of admission due to high intensity of service, high risk for further deterioration and high frequency of surveillance required.*   For questions or updates, please contact [facility] Please consult [email or web address] for contact info under      Signed, Renelda Carry, MD  10/05/2023 9:07 PM

## 2023-10-05 NOTE — Progress Notes (Addendum)
 PHARMACY - ANTICOAGULATION CONSULT NOTE  Pharmacy Consult for Heparin  Indication: atrial fibrillation  Allergies  Allergen Reactions   Penicillins Swelling    Tolerated Ancef  12/01/22   Ambien  [Zolpidem  Tartrate] Other (See Comments)    Causes hallucinations.    Codeine Other (See Comments)    hallucinations    Lisinopril  Cough    Patient Measurements:  Heparin  dosing weight (93.8kg)  Vital Signs: Temp: 97.6 F (36.4 C) (06/04 2035) Temp Source: Oral (06/04 2035) BP: 118/72 (06/04 2035) Pulse Rate: 84 (06/04 2035)  Labs: Recent Labs    10/05/23 1638 10/05/23 1730 10/05/23 1818  HGB  --  15.2  --   HCT  --  45.4  --   PLT  --  168  --   LABPROT 13.9  --   --   INR 1.1  --   --   CREATININE 1.24  --   --   TROPONINIHS 7  --  7    CrCl cannot be calculated (Unknown ideal weight.).   Medical History: Past Medical History:  Diagnosis Date   Anemia    Anticoagulated- to go to Xarelto  for a fib 10/07/2013   Arthritis    "fingers, toes" (10/05/2013)   Coronary artery disease    a. s/p MI with stent in 2000   Diverticulosis    Dysrhythmia    GERD (gastroesophageal reflux disease)    History of blood transfusion    "w/nephrectomy; w/shoulder replacement; w/knee replacement"   Hyperlipidemia    Hypertension    Left knee DJD    Myocardial infarction (HCC) 05/04/1995   Neuropathy    feet bilat    NSTEMI (non-ST elevated myocardial infarction), type 2 due to demand ischemia 10/07/2013   PAF (paroxysmal atrial fibrillation) (HCC)    Pneumonia ~ 2007   again 3 months ago    Primary localized osteoarthritis of right knee    Renal cell carcinoma (HCC)    a. s/p right nephrectomy 08/14/09   Shortness of breath    a. related to chemo drugs   Tendonitis    currently on prednisone and wearing brace (left wrist)   Type II diabetes mellitus (HCC)    "treating w/diet and weight loss"     Assessment: 67 YOM admitted for witnessed syncope episode with LOC. Pertinent  PMH of MI s/p stent in 1994. Pharmacy consulted to dose heparin  for new onset afib. No PTA anticoagulation.  Goal of Therapy:  Heparin  level 0.3-0.7 units/ml Monitor platelets by anticoagulation protocol: Yes   Plan:  Give 4000 units bolus x 1 Start heparin  infusion at 1350 units/hr Check anti-Xa level in 8 hours and daily while on heparin  Continue to monitor H&H and platelets  Saleha Kalp 10/05/2023,8:56 PM

## 2023-10-05 NOTE — ED Triage Notes (Signed)
 BIB [facility] from home, patient had a syncope episode with witnessed positive LOC by wife.Per EmS wife stated she could not get patient to wake up even with a sternum rub.  Patient was found to be diaphoretic by EMS. Patient has a history of HTN, MI with stent placed 1994-1995.   4mg  of zofran  given with EMS

## 2023-10-06 ENCOUNTER — Inpatient Hospital Stay (HOSPITAL_COMMUNITY)

## 2023-10-06 DIAGNOSIS — I480 Paroxysmal atrial fibrillation: Secondary | ICD-10-CM

## 2023-10-06 DIAGNOSIS — R55 Syncope and collapse: Secondary | ICD-10-CM

## 2023-10-06 LAB — ECHOCARDIOGRAM COMPLETE: S' Lateral: 3.4 cm

## 2023-10-06 LAB — BASIC METABOLIC PANEL WITH GFR
Anion gap: 9 (ref 5–15)
BUN: 26 mg/dL — ABNORMAL HIGH (ref 8–23)
CO2: 21 mmol/L — ABNORMAL LOW (ref 22–32)
Calcium: 8.6 mg/dL — ABNORMAL LOW (ref 8.9–10.3)
Chloride: 107 mmol/L (ref 98–111)
Creatinine, Ser: 1.05 mg/dL (ref 0.61–1.24)
GFR, Estimated: >60 mL/min (ref >60)
Glucose, Bld: 95 mg/dL (ref 70–99)
Potassium: 4.6 mmol/L (ref 3.5–5.1)
Sodium: 137 mmol/L (ref 135–145)

## 2023-10-06 LAB — PROTIME-INR
INR: 2.4 — ABNORMAL HIGH (ref 0.8–1.2)
Prothrombin Time: 26.1 s — ABNORMAL HIGH (ref 11.4–15.2)

## 2023-10-06 LAB — CBC
HCT: 43.2 % (ref 39.0–52.0)
Hemoglobin: 14.3 g/dL (ref 13.0–17.0)
MCH: 30 pg (ref 26.0–34.0)
MCHC: 33.1 g/dL (ref 30.0–36.0)
MCV: 90.6 fL (ref 80.0–100.0)
Platelets: 157 10*3/uL (ref 150–400)
RBC: 4.77 MIL/uL (ref 4.22–5.81)
RDW: 13.7 % (ref 11.5–15.5)
WBC: 6.1 10*3/uL (ref 4.0–10.5)
nRBC: 0 % (ref 0.0–0.2)

## 2023-10-06 MED ORDER — METOPROLOL TARTRATE 25 MG PO TABS
25.0000 mg | ORAL_TABLET | Freq: Two times a day (BID) | ORAL | Status: DC
  Administered 2023-10-06 (×3): 25 mg via ORAL
  Filled 2023-10-06 (×4): qty 1

## 2023-10-06 MED ORDER — ASPIRIN 81 MG PO TBEC
81.0000 mg | DELAYED_RELEASE_TABLET | Freq: Every morning | ORAL | Status: DC
  Filled 2023-10-06: qty 1

## 2023-10-06 MED ORDER — ORAL CARE MOUTH RINSE: 15.0000 mL | OROMUCOSAL | Status: DC | PRN

## 2023-10-06 MED ORDER — ATORVASTATIN CALCIUM 10 MG PO TABS
10.0000 mg | ORAL_TABLET | Freq: Every day | ORAL | Status: DC
  Administered 2023-10-06: 10 mg via ORAL
  Filled 2023-10-06: qty 1

## 2023-10-06 MED ORDER — IRBESARTAN 300 MG PO TABS
300.0000 mg | ORAL_TABLET | Freq: Every day | ORAL | Status: DC
  Administered 2023-10-06: 300 mg via ORAL
  Filled 2023-10-06: qty 1

## 2023-10-06 NOTE — ED Notes (Signed)
 Called dietary for meal tray

## 2023-10-06 NOTE — ED Notes (Signed)
 Breakfast tray delivered

## 2023-10-06 NOTE — H&P (View-Only) (Signed)
 Rounding Note    Patient Name: Donald Gray Date of Encounter: 10/06/2023  [facility] Cardiologist: Knox Perl, MD   Subjective   Reviewed history.  Ports that he had had poor oral intake yesterday, was with family and noted feeling poorly for a short time and then he woke up on the ground.  Loss of consciousness was witnessed by his family.  He reports loss of bladder function at the event.  Not had any chest pain, shortness of breath, palpitations, fevers, chills, or other symptoms prior to this.  He did feel somewhat nauseated and diaphoretic after but this was short-lived.  He could not tell that he was now in atrial fibrillation.  Reviewed his prior history of this with him and noted that he had been off of anticoagulation after not having episodes of A-fib in a long time.   We discussed options and neck steps for management, see below  Inpatient Medications    Scheduled Meds:  aspirin  EC  81 mg Oral q AM   atorvastatin   10 mg Oral QHS   irbesartan   300 mg Oral Daily   metoprolol  tartrate  25 mg Oral BID   rivaroxaban   20 mg Oral Q supper   Continuous Infusions:  PRN Meds: acetaminophen , ondansetron  (ZOFRAN ) IV   Vital Signs    Vitals:   10/06/23 0300 10/06/23 0314 10/06/23 0600 10/06/23 0757  BP: 110/75 110/75 101/68 113/82  Pulse: 71 83 (!) 37 64  Resp: 13 19 10 12   Temp:  97.9 F (36.6 C)  97.6 F (36.4 C)  TempSrc:  Oral    SpO2: 100% 100% 98%     Intake/Output Summary (Last 24 hours) at 10/06/2023 0805 Last data filed at 10/06/2023 0758 Gross per 24 hour  Intake --  Output 275 ml  Net -275 ml      07/26/2023    2:57 PM 11/30/2022    7:43 AM 11/26/2022   10:11 AM  Last 3 Weights  Weight (lbs) 206 lb 201 lb 201 lb  Weight (kg) 93.441 kg 91.173 kg 91.173 kg      Telemetry    Atrial fibrillation rates largely in the 60s, occasional pauses of 2 seconds but nothing that is 3 seconds or longer.- Personally Reviewed  Physical Exam    GEN: No acute distress.   Neck: No JVD Cardiac: irregularly irregular, no murmurs, rubs, or gallops.  Respiratory: Clear to auscultation bilaterally. GI: Soft, nontender, non-distended  MS: No edema; No deformity. Neuro:  Nonfocal  Psych: Normal affect   New pertinent results (labs, ECG, imaging, cardiac studies)     Assessment & Plan    Syncope Paroxysmal atrial fibrillation -Blood pressures have been low here, even with holding his ARB.  He endorsed poor oral intake yesterday.  Syncope may have been related to hypotension.  However the other concern is for significant pause.  He is in atrial fibrillation, rate controlled with metoprolol , but has occasional pauses of 2 seconds.  He is asymptomatic with these.  We discussed that I cannot exclude that he had a longer pause that led to his syncope -Initially overnight they had discussed TEE/cardioversion with him.  However he is only received 1 dose of Xarelto  and would require 3 prior to this.  We also discussed the option of scheduling a cardioversion alone as an outpatient in 3 weeks, which we both agreed we prefer. -Will repeat echocardiogram to exclude new structural etiology for his syncope.  If this is  unremarkable, would place a live event monitor on to make sure we are not missing long pauses. -Within arrange for outpatient A-fib clinic follow-up in several weeks and outpatient cardioversion if he is still in A-fib at that follow-up visit. -Reviewed red flags that need immediate medical attention. -Discussed holding the telmisartan  at home and checking home blood pressures, restarting if blood pressures are consistently more than 140 systolic.  I have called the echo lab to try to expedite his echocardiogram, but this may not be able to be done today.  If echo shows newly reduced EF or other structural abnormalities, would need to consider keeping him overnight and proceeding with TEE/cardioversion this admission.   Awaiting  results of echo for next steps.    Signed, Sheryle Donning, MD  10/06/2023, 8:05 AM

## 2023-10-06 NOTE — ED Notes (Signed)
Echo @ bedside

## 2023-10-06 NOTE — Progress Notes (Signed)
  Echocardiogram 2D Echocardiogram has been performed.  Donald Gray 10/06/2023, 4:40 PM

## 2023-10-06 NOTE — Progress Notes (Signed)
 Rounding Note    Patient Name: Donald Gray Date of Encounter: 10/06/2023  [facility] Cardiologist: Knox Perl, MD   Subjective   Reviewed history.  Ports that he had had poor oral intake yesterday, was with family and noted feeling poorly for a short time and then he woke up on the ground.  Loss of consciousness was witnessed by his family.  He reports loss of bladder function at the event.  Not had any chest pain, shortness of breath, palpitations, fevers, chills, or other symptoms prior to this.  He did feel somewhat nauseated and diaphoretic after but this was short-lived.  He could not tell that he was now in atrial fibrillation.  Reviewed his prior history of this with him and noted that he had been off of anticoagulation after not having episodes of A-fib in a long time.   We discussed options and neck steps for management, see below  Inpatient Medications    Scheduled Meds:  aspirin  EC  81 mg Oral q AM   atorvastatin   10 mg Oral QHS   irbesartan   300 mg Oral Daily   metoprolol  tartrate  25 mg Oral BID   rivaroxaban   20 mg Oral Q supper   Continuous Infusions:  PRN Meds: acetaminophen , ondansetron  (ZOFRAN ) IV   Vital Signs    Vitals:   10/06/23 0300 10/06/23 0314 10/06/23 0600 10/06/23 0757  BP: 110/75 110/75 101/68 113/82  Pulse: 71 83 (!) 37 64  Resp: 13 19 10 12   Temp:  97.9 F (36.6 C)  97.6 F (36.4 C)  TempSrc:  Oral    SpO2: 100% 100% 98%     Intake/Output Summary (Last 24 hours) at 10/06/2023 0805 Last data filed at 10/06/2023 0758 Gross per 24 hour  Intake --  Output 275 ml  Net -275 ml      07/26/2023    2:57 PM 11/30/2022    7:43 AM 11/26/2022   10:11 AM  Last 3 Weights  Weight (lbs) 206 lb 201 lb 201 lb  Weight (kg) 93.441 kg 91.173 kg 91.173 kg      Telemetry    Atrial fibrillation rates largely in the 60s, occasional pauses of 2 seconds but nothing that is 3 seconds or longer.- Personally Reviewed  Physical Exam    GEN: No acute distress.   Neck: No JVD Cardiac: irregularly irregular, no murmurs, rubs, or gallops.  Respiratory: Clear to auscultation bilaterally. GI: Soft, nontender, non-distended  MS: No edema; No deformity. Neuro:  Nonfocal  Psych: Normal affect   New pertinent results (labs, ECG, imaging, cardiac studies)     Assessment & Plan    Syncope Paroxysmal atrial fibrillation -Blood pressures have been low here, even with holding his ARB.  He endorsed poor oral intake yesterday.  Syncope may have been related to hypotension.  However the other concern is for significant pause.  He is in atrial fibrillation, rate controlled with metoprolol , but has occasional pauses of 2 seconds.  He is asymptomatic with these.  We discussed that I cannot exclude that he had a longer pause that led to his syncope -Initially overnight they had discussed TEE/cardioversion with him.  However he is only received 1 dose of Xarelto  and would require 3 prior to this.  We also discussed the option of scheduling a cardioversion alone as an outpatient in 3 weeks, which we both agreed we prefer. -Will repeat echocardiogram to exclude new structural etiology for his syncope.  If this is  unremarkable, would place a live event monitor on to make sure we are not missing long pauses. -Within arrange for outpatient A-fib clinic follow-up in several weeks and outpatient cardioversion if he is still in A-fib at that follow-up visit. -Reviewed red flags that need immediate medical attention. -Discussed holding the telmisartan  at home and checking home blood pressures, restarting if blood pressures are consistently more than 140 systolic.  I have called the echo lab to try to expedite his echocardiogram, but this may not be able to be done today.  If echo shows newly reduced EF or other structural abnormalities, would need to consider keeping him overnight and proceeding with TEE/cardioversion this admission.   Awaiting  results of echo for next steps.    Signed, Sheryle Donning, MD  10/06/2023, 8:05 AM

## 2023-10-06 NOTE — Progress Notes (Signed)
 Patient transferred from another part of the ER.  He states that he did not receive his blood pressure meds last night . It is now 0115 am . The other RN told me she already went over this with him . His bp is 117/59.  I had the secretary page Cardiology to  call me , so that I can confirm whether he will or will not be getting the b/p meds at this time.

## 2023-10-07 ENCOUNTER — Other Ambulatory Visit (HOSPITAL_COMMUNITY): Payer: Self-pay

## 2023-10-07 DIAGNOSIS — I480 Paroxysmal atrial fibrillation: Secondary | ICD-10-CM | POA: Diagnosis not present

## 2023-10-07 DIAGNOSIS — R55 Syncope and collapse: Secondary | ICD-10-CM | POA: Diagnosis not present

## 2023-10-07 LAB — CBC
HCT: 42.9 % (ref 39.0–52.0)
Hemoglobin: 14.3 g/dL (ref 13.0–17.0)
MCH: 30 pg (ref 26.0–34.0)
MCHC: 33.3 g/dL (ref 30.0–36.0)
MCV: 89.9 fL (ref 80.0–100.0)
Platelets: 177 10*3/uL (ref 150–400)
RBC: 4.77 MIL/uL (ref 4.22–5.81)
RDW: 13.5 % (ref 11.5–15.5)
WBC: 6.5 10*3/uL (ref 4.0–10.5)
nRBC: 0 % (ref 0.0–0.2)

## 2023-10-07 MED ORDER — METOPROLOL TARTRATE 25 MG PO TABS
25.0000 mg | ORAL_TABLET | Freq: Two times a day (BID) | ORAL | Status: DC
  Administered 2023-10-07: 25 mg via ORAL

## 2023-10-07 MED ORDER — RIVAROXABAN 20 MG PO TABS: 20.0000 mg | ORAL_TABLET | Freq: Every day | ORAL | 11 refills | Status: DC

## 2023-10-07 MED ORDER — TELMISARTAN 80 MG PO TABS: 80.0000 mg | ORAL_TABLET | Freq: Every evening | ORAL | Status: DC

## 2023-10-07 NOTE — TOC CM/SW Note (Signed)
 Transition of Care [facility]) - Inpatient Brief Assessment   Patient Details  Name: Donald Gray MRN: [id number] Date of Birth: [birth year]  Transition of Care [facility]) CM/SW Contact:    Cosimo Diones, RN Phone Number: 10/07/2023, 10:50 AM   Clinical Narrative: Patient presented for syncope and paroxysmal atrial fib. PTA patient was independent from home with spouse that is a Engineer, civil (consulting). Patient states he has DME rolling walker, bedside commode, and grab bars in the home. Patient states he was taking Xarelto  at home previously and uses a mail order pharmacy. No home needs identified at this time. Spouse will transport patient home via private vehicle.      Transition of Care Asessment: Insurance and Status: Insurance coverage has been reviewed Patient has primary care physician: Yes Home environment has been reviewed: reviewed Prior level of function:: independent Prior/Current Home Services: No current home services Social Drivers of Health Review: SDOH reviewed no interventions necessary Readmission risk has been reviewed: Yes Transition of care needs: no transition of care needs at this time

## 2023-10-07 NOTE — Discharge Instructions (Signed)
 Dear Donald Gray  You are scheduled for a TEE (Transesophageal Echocardiogram) Guided Cardioversion on Friday, June 13 with Dr. Veryl Gottron.  Please arrive at the [facility] (Main Entrance A) at [facility]: [address] [place], [place] [address] at 11:00 AM (This time is 1 hour(s) before your procedure to ensure your preparation).   Free valet parking service is available. You will check in at ADMITTING.   *Please Note: You will receive a call the day before your procedure to confirm the appointment time. That time may have changed from the original time based on the schedule for that day.*   DIET:  Nothing to eat or drink after midnight except a sip of water  with medications (see medication instructions below)  MEDICATION INSTRUCTIONS: !!IF ANY NEW MEDICATIONS ARE STARTED AFTER TODAY, PLEASE NOTIFY YOUR PROVIDER AS SOON AS POSSIBLE!!  FYI: Medications such as Semaglutide (Ozempic, [place]), Tirzepatide (Mounjaro, Zepbound), Dulaglutide (Trulicity), etc ("GLP1 agonists") AND Canagliflozin (Invokana), Dapagliflozin (Farxiga), Empagliflozin (Jardiance), Ertugliflozin (Steglatro), Bexagliflozin [employer]) or any combination with one of these drugs such as Invokamet (Canagliflozin/Metformin), Synjardy (Empagliflozin/Metformin), etc ("SGLT2 inhibitors") must be held around the time of a procedure. This is not a comprehensive list of all of these drugs. Please review all of your medications and talk to your provider if you take any one of these. If you are not sure, ask your provider.  Continue taking your anticoagulant (blood thinner): Rivaroxaban  (Xarelto ).  You will need to continue this after your procedure until you are told by your provider that it is safe to stop.    FYI:  For your safety, and to allow us  to monitor your vital signs accurately during the surgery/procedure we request: If you have artificial nails, gel coating, SNS etc, please have those removed prior to your  surgery/procedure. Not having the nail coverings /polish removed may result in cancellation or delay of your surgery/procedure.  Your support person will be asked to wait in the waiting room during your procedure.  It is OK to have someone drop you off and come back when you are ready to be discharged.  You cannot drive after the procedure and will need someone to drive you home.  Bring your insurance cards.  *Special Note: Every effort is made to have your procedure done on time. Occasionally there are emergencies that occur at the hospital that may cause delays. Please be patient if a delay does occur.

## 2023-10-07 NOTE — Progress Notes (Signed)
 Rounding Note   Patient Name: Donald Gray Date of Encounter: 10/07/2023  [facility] Cardiologist: Knox Perl, MD   Subjective  He is in RVR this morning and generally asymptomatic. Rates not controlled with minimal activity.  Scheduled Meds:  atorvastatin   10 mg Oral QHS   metoprolol  tartrate  25 mg Oral BID   rivaroxaban   20 mg Oral Q supper   Continuous Infusions:  PRN Meds: acetaminophen , ondansetron  (ZOFRAN ) IV, mouth rinse   Vital Signs  Vitals:   10/06/23 2023 10/06/23 2300 10/07/23 0416 10/07/23 0755  BP: 115/68 120/75 113/78 122/86  Pulse: 82  68 (!) 102  Resp:  16 18 18   Temp:  97.7 F (36.5 C) 97.9 F (36.6 C) 97.8 F (36.6 C)  TempSrc:  Oral Oral Oral  SpO2:  94% 94% 95%  Weight:      Height:        Intake/Output Summary (Last 24 hours) at 10/07/2023 0814 Last data filed at 10/07/2023 0758 Gross per 24 hour  Intake 560 ml  Output 1370 ml  Net -810 ml      10/06/2023    6:12 PM 07/26/2023    2:57 PM 11/30/2022    7:43 AM  Last 3 Weights  Weight (lbs) 202 lb 206 lb 201 lb  Weight (kg) 91.627 kg 93.441 kg 91.173 kg      Telemetry Afib in the 50-60s, now in the 90-120s - Personally Reviewed   Physical Exam  GEN: No acute distress.   Neck: No JVD Cardiac: irregular rhythm, tachycardic rate Respiratory: Clear to auscultation bilaterally. GI: Soft, nontender, non-distended  MS: No edema; No deformity. Neuro:  Nonfocal  Psych: Normal affect   Labs High Sensitivity Troponin:   Recent Labs  Lab 10/05/23 1638 10/05/23 1818  TROPONINIHS 7 7     Chemistry Recent Labs  Lab 10/05/23 1638 10/06/23 0500  NA 138 137  K 4.8 4.6  CL 105 107  CO2 21* 21*  GLUCOSE 148* 95  BUN 27* 26*  CREATININE 1.24 1.05  CALCIUM  9.1 8.6*  PROT 6.3*  --   ALBUMIN 3.8  --   AST 29  --   ALT 28  --   ALKPHOS 55  --   BILITOT 1.1  --   GFRNONAA 58* >60  ANIONGAP 12 9    Lipids No results for input(s): "CHOL", "TRIG", "HDL", "LABVLDL",  "LDLCALC", "CHOLHDL" in the last 168 hours.  Hematology Recent Labs  Lab 10/05/23 1730 10/06/23 0500 10/07/23 0447  WBC 8.4 6.1 6.5  RBC 5.01 4.77 4.77  HGB 15.2 14.3 14.3  HCT 45.4 43.2 42.9  MCV 90.6 90.6 89.9  MCH 30.3 30.0 30.0  MCHC 33.5 33.1 33.3  RDW 13.4 13.7 13.5  PLT 168 157 177   Thyroid  No results for input(s): "TSH", "FREET4" in the last 168 hours.  BNP Recent Labs  Lab 10/05/23 1730  BNP 413.1*    DDimer  Recent Labs  Lab 10/05/23 1638  DDIMER 0.42     Radiology  ECHOCARDIOGRAM COMPLETE Result Date: 10/06/2023    ECHOCARDIOGRAM REPORT   Patient Name:   Donald Gray Date of Exam: 10/06/2023 Medical Rec #:  [id number]           Height:       71.0 in Accession #:    [id number]          Weight:       206.0 lb Date of Birth:  October 19, 1940  BSA:          2.135 m Patient Age:    83 years            BP:           101/58 mmHg Patient Gender: M                   HR:           73 bpm. Exam Location:  Inpatient Procedure: 2D Echo (Both Spectral and Color Flow Doppler were utilized during            procedure). Indications:    syncope  History:        Patient has prior history of Echocardiogram examinations, most                 recent 12/15/2021. CAD, Arrythmias:Atrial Fibrillation,                 Signs/Symptoms:Dyspnea; Risk Factors:Diabetes and Dyslipidemia.  Sonographer:    Dione Franks RDCS Referring Phys: [phone] BRIDGETTE CHRISTOPHER IMPRESSIONS  1. Left ventricular ejection fraction, by estimation, is 60 to 65%. The left ventricle has normal function. The left ventricle has no regional wall motion abnormalities. There is mild left ventricular hypertrophy of the basal-septal segment. Left ventricular diastolic parameters are indeterminate.  2. Right ventricular systolic function is normal. The right ventricular size is normal. There is normal pulmonary artery systolic pressure.  3. The mitral valve is normal in structure. No evidence of mitral valve  regurgitation. No evidence of mitral stenosis.  4. The aortic valve is normal in structure. Aortic valve regurgitation is not visualized. No aortic stenosis is present.  5. The inferior vena cava is normal in size with greater than 50% respiratory variability, suggesting right atrial pressure of 3 mmHg. FINDINGS  Left Ventricle: Left ventricular ejection fraction, by estimation, is 60 to 65%. The left ventricle has normal function. The left ventricle has no regional wall motion abnormalities. The left ventricular internal cavity size was normal in size. There is  mild left ventricular hypertrophy of the basal-septal segment. Left ventricular diastolic parameters are indeterminate. Right Ventricle: The right ventricular size is normal. No increase in right ventricular wall thickness. Right ventricular systolic function is normal. There is normal pulmonary artery systolic pressure. The tricuspid regurgitant velocity is 2.38 m/s, and  with an assumed right atrial pressure of 3 mmHg, the estimated right ventricular systolic pressure is 25.7 mmHg. Left Atrium: Left atrial size was normal in size. Right Atrium: Right atrial size was normal in size. Pericardium: There is no evidence of pericardial effusion. Mitral Valve: The mitral valve is normal in structure. No evidence of mitral valve regurgitation. No evidence of mitral valve stenosis. Tricuspid Valve: The tricuspid valve is normal in structure. Tricuspid valve regurgitation is trivial. No evidence of tricuspid stenosis. Aortic Valve: The aortic valve is normal in structure. Aortic valve regurgitation is not visualized. No aortic stenosis is present. Pulmonic Valve: The pulmonic valve was normal in structure. Pulmonic valve regurgitation is not visualized. No evidence of pulmonic stenosis. Aorta: The aortic root is normal in size and structure. Venous: The inferior vena cava is normal in size with greater than 50% respiratory variability, suggesting right atrial  pressure of 3 mmHg. IAS/Shunts: No atrial level shunt detected by color flow Doppler.  LEFT VENTRICLE PLAX 2D LVIDd:         4.70 cm LVIDs:         3.40 cm LV PW:  1.10 cm LV IVS:        1.40 cm LVOT diam:     2.20 cm LVOT Area:     3.80 cm  RIGHT VENTRICLE            IVC RV Basal diam:  2.80 cm    IVC diam: 1.70 cm RV S prime:     9.90 cm/s TAPSE (M-mode): 1.7 cm LEFT ATRIUM             Index        RIGHT ATRIUM           Index LA diam:        3.80 cm 1.78 cm/m   RA Area:     14.90 cm LA Vol (A2C):   39.1 ml 18.31 ml/m  RA Volume:   34.30 ml  16.06 ml/m LA Vol (A4C):   33.7 ml 15.78 ml/m LA Biplane Vol: 38.9 ml 18.22 ml/m   AORTA Ao Root diam: 3.40 cm Ao Asc diam:  3.40 cm TRICUSPID VALVE TR Peak grad:   22.7 mmHg TR Vmax:        238.00 cm/s  SHUNTS Systemic Diam: 2.20 cm Dorothye Gathers MD Electronically signed by Dorothye Gathers MD Signature Date/Time: 10/06/2023/4:52:59 PM    Final    DG Chest Port 1 View Result Date: 10/05/2023 CLINICAL DATA:  Syncope. EXAM: PORTABLE CHEST 1 VIEW COMPARISON:  Chest radiograph dated 12/28/2013 FINDINGS: No focal consolidation, pleural effusion, pneumothorax. The cardiac silhouette is within limits. No acute osseous pathology. IMPRESSION: No active disease. Electronically Signed   By: Angus Bark M.D.   On: 10/05/2023 18:03   CT HEAD WO CONTRAST ( ) Result Date: 10/05/2023 CLINICAL DATA:  Mental status change, unknown cause Syncope/presyncope, cerebrovascular cause suspected4 EXAM: CT HEAD WITHOUT CONTRAST TECHNIQUE: Contiguous axial images were obtained from the base of the skull through the vertex without intravenous contrast. RADIATION DOSE REDUCTION: This exam was performed according to the departmental dose-optimization program which includes automated exposure control, adjustment of the mA and/or kV according to patient size and/or use of iterative reconstruction technique. COMPARISON:  None Available. FINDINGS: Brain: No intracranial hemorrhage, mass effect,  or midline shift. No hydrocephalus. Age related atrophy. The basilar cisterns are patent. Moderate periventricular and deep white matter hypodensity typical of chronic small vessel ischemia. No evidence of territorial infarct or acute ischemia. No extra-axial or intracranial fluid collection. Vascular: Atherosclerosis of skull base vasculature. Slight generalized increase of intracranial vasculature, but no discrete hyperdense vessel. Skull: No fracture or focal lesion. Sinuses/Orbits: Paranasal sinuses and mastoid air cells are clear. The visualized orbits are unremarkable. Other: None. IMPRESSION: 1. No acute intracranial abnormality. 2. Age related atrophy and chronic small vessel ischemia. Electronically Signed   By: Chadwick Colonel M.D.   On: 10/05/2023 17:08    Cardiac Studies  Echo 10/06/23: 1. Left ventricular ejection fraction, by estimation, is 60 to 65%. The  left ventricle has normal function. The left ventricle has no regional  wall motion abnormalities. There is mild left ventricular hypertrophy of  the basal-septal segment. Left  ventricular diastolic parameters are indeterminate.   2. Right ventricular systolic function is normal. The right ventricular  size is normal. There is normal pulmonary artery systolic pressure.   3. The mitral valve is normal in structure. No evidence of mitral valve  regurgitation. No evidence of mitral stenosis.   4. The aortic valve is normal in structure. Aortic valve regurgitation is  not visualized. No aortic stenosis is  present.   5. The inferior vena cava is normal in size with greater than 50%  respiratory variability, suggesting right atrial pressure of 3 mmHg.   Patient Profile   83 y.o. male with prior PAF not on OAC prior to this admission presented after a syncopal episode found to be in Afib. Other PMH includes CAD and prior DCCV.    Assessment & Plan   PAF Afib RVR - has been rate controlled well in the 50-60s on 25 mg lopressor   BID - unfortunately mild RVR this morning while resting and in the 120-130s with minimal activity - I have re-timed his lopressor  to give early - he remains asymptomatic with his rate - echocardiogram unremarkable yesterday - will keep this morning and continue to watch rates - question if he is having tachy-brady syndrome -will need to continue telemetry this morning and watch rates - will not increase BB for now given bradycardia and syncope   Syncope - question bradycardia or pause in the setting of Afib - depending on timing of DCCV, would likely need to place a heart monitor   Chronic anticoagulation - resumed xarelto  at - developed a subconjunctival hemorrhage - he states he has had three of these in the past - no other signs of bleeding     For questions or updates, please contact [facility] Please consult [email or web address] for contact info under     Signed, Lamond Pilot, PA  10/07/2023, 8:14 AM

## 2023-10-07 NOTE — Discharge Summary (Signed)
 Discharge Summary   Patient ID: Donald Gray MRN: [id number]; DOB: 1941/04/06  Admit date: 10/05/2023 Discharge date: 10/07/2023  PCP:  Benedetta Bradley, MD   [facility] Providers Cardiologist:  Sheryle Donning, MD       Discharge Diagnoses  Principal Problem:   Syncope and collapse Active Problems:   Syncope   Diagnostic Studies/Procedures  Echocardiogram _____________   History of Present Illness   Donald Gray is a 83 y.o. male with PMH hypertension, CAD with prior MI, hyperlipidemia, remote atrial fibrillation who presented with syncope. Patient reported poor PO intake that day, had prodrome of feeling poorly, then woke up on the ground with loss of bladder function. Syncope was witness by his wife. No chest pain, shortness of breath, palpitations prior. On arrival, he was hemodynamically stable, BP 105/75, in rate controlled atrial fibrillation. No acute changes on chest x ray or CT head, labs largely unremarkable.  Hospital Course   Consultants: None   Patient was admitted to cardiology service by Dr. Sanjuana Crutch. He was started on Xarelto , and metoprolol  tartrate 25 mg BID was continued. His blood pressure remained borderline low, so telmisartan  was held. Initially patient had afib with rates in the 60s with occasional pauses of 2 seconds, none longer. He was asymptomatic with these. Echo was performed, no significant structural abnormalities. Overnight 6/5-6/6 his rates began to increase, with rates at rest in the 90s and going up to the 130s with activity.  We discussed options at length, including: remaining hospitalized for TEE-CV on 6/9 (had not had three doses of DOAC to do prior), scheduling outpatient TEE-CV in near future, or placing monitor/arranging for outpatient afib clinic follow up/cardioversion alone in 3 weeks. He did not have further syncope or presyncope symptoms but overall felt fatigued. After shared decision making, he will return  as an outpatient for TEE-CV with me on 6/13 and then follow up with me in clinic. He is amenable to continuing Xarelto  and has unexpired doses remaining at home. His aspirin  was stopped given start of DOAC. His blood pressure was trending up on the day of discharge, and he was instructed to restart his telmisartan  when his BP was consistently more than 140/90.  Reviewed red flag warning signs that need immediate medical attention.      Did the patient have an acute coronary syndrome (MI, NSTEMI, STEMI, etc) this admission?:  No                               Did the patient have a percutaneous coronary intervention (stent / angioplasty)?:  No.    Informed Consent   Shared Decision Making/Informed Consent   The risks [stroke, cardiac arrhythmias rarely resulting in the need for a temporary or permanent pacemaker, skin irritation or burns, esophageal damage, perforation (1:10,000 risk), bleeding, pharyngeal hematoma as well as other potential complications associated with conscious sedation including aspiration, arrhythmia, respiratory failure and death], benefits (treatment guidance, restoration of normal sinus rhythm, diagnostic support) and alternatives of a transesophageal echocardiogram guided cardioversion were discussed in detail with Donald Gray and he is willing to proceed.        _____________  Discharge Vitals Blood pressure 122/86, pulse (!) 102, temperature 97.8 F (36.6 C), temperature source Oral, resp. rate 18, height 5\' 10"  (1.778 m), weight 91.6 kg, SpO2 95%.  Filed Weights   10/06/23 1812  Weight: 91.6 kg    Labs & Radiologic Studies  CBC Recent Labs    10/05/23 1730 10/06/23 0500 10/07/23 0447  WBC 8.4 6.1 6.5  NEUTROABS 6.6  --   --   HGB 15.2 14.3 14.3  HCT 45.4 43.2 42.9  MCV 90.6 90.6 89.9  PLT 168 157 177   Basic Metabolic Panel Recent Labs    16/10/96 1638 10/06/23 0500  NA 138 137  K 4.8 4.6  CL 105 107  CO2 21* 21*  GLUCOSE 148* 95  BUN 27*  26*  CREATININE 1.24 1.05  CALCIUM  9.1 8.6*   Liver Function Tests Recent Labs    10/05/23 1638  AST 29  ALT 28  ALKPHOS 55  BILITOT 1.1  PROT 6.3*  ALBUMIN 3.8   No results for input(s): "LIPASE", "AMYLASE" in the last 72 hours. High Sensitivity Troponin:   Recent Labs  Lab 10/05/23 1638 10/05/23 1818  TROPONINIHS 7 7    No results for input(s): "TRNPT" in the last 720 hours.  BNP Invalid input(s): "POCBNP" No results for input(s): "PROBNP" in the last 72 hours.  Recent Labs    10/05/23 1730  BNP 413.1*    D-Dimer Recent Labs    10/05/23 1638  DDIMER 0.42   Hemoglobin A1C No results for input(s): "HGBA1C" in the last 72 hours. Fasting Lipid Panel No results for input(s): "CHOL", "HDL", "LDLCALC", "TRIG", "CHOLHDL", "LDLDIRECT" in the last 72 hours. No results found for: "LIPOA"  Thyroid  Function Tests No results for input(s): "TSH", "T4TOTAL", "T3FREE", "THYROIDAB" in the last 72 hours.  Invalid input(s): "FREET3" _____________  ECHOCARDIOGRAM COMPLETE Result Date: 10/06/2023    ECHOCARDIOGRAM REPORT   Patient Name:   Donald Gray Date of Exam: 10/06/2023 Medical Rec #:  [id number]           Height:       71.0 in Accession #:    [id number]          Weight:       206.0 lb Date of Birth:  Jun 25, 1940           BSA:          2.135 m Patient Age:    83 years            BP:           101/58 mmHg Patient Gender: M                   HR:           73 bpm. Exam Location:  Inpatient Procedure: 2D Echo (Both Spectral and Color Flow Doppler were utilized during            procedure). Indications:    syncope  History:        Patient has prior history of Echocardiogram examinations, most                 recent 12/15/2021. CAD, Arrythmias:Atrial Fibrillation,                 Signs/Symptoms:Dyspnea; Risk Factors:Diabetes and Dyslipidemia.  Sonographer:    Dione Franks RDCS Referring Phys: [phone] Kaydence Baba IMPRESSIONS  1. Left ventricular ejection fraction, by  estimation, is 60 to 65%. The left ventricle has normal function. The left ventricle has no regional wall motion abnormalities. There is mild left ventricular hypertrophy of the basal-septal segment. Left ventricular diastolic parameters are indeterminate.  2. Right ventricular systolic function is normal. The right ventricular size is normal. There is normal pulmonary artery systolic pressure.  3. The mitral valve is normal in structure. No evidence of mitral valve regurgitation. No evidence of mitral stenosis.  4. The aortic valve is normal in structure. Aortic valve regurgitation is not visualized. No aortic stenosis is present.  5. The inferior vena cava is normal in size with greater than 50% respiratory variability, suggesting right atrial pressure of 3 mmHg. FINDINGS  Left Ventricle: Left ventricular ejection fraction, by estimation, is 60 to 65%. The left ventricle has normal function. The left ventricle has no regional wall motion abnormalities. The left ventricular internal cavity size was normal in size. There is  mild left ventricular hypertrophy of the basal-septal segment. Left ventricular diastolic parameters are indeterminate. Right Ventricle: The right ventricular size is normal. No increase in right ventricular wall thickness. Right ventricular systolic function is normal. There is normal pulmonary artery systolic pressure. The tricuspid regurgitant velocity is 2.38 m/s, and  with an assumed right atrial pressure of 3 mmHg, the estimated right ventricular systolic pressure is 25.7 mmHg. Left Atrium: Left atrial size was normal in size. Right Atrium: Right atrial size was normal in size. Pericardium: There is no evidence of pericardial effusion. Mitral Valve: The mitral valve is normal in structure. No evidence of mitral valve regurgitation. No evidence of mitral valve stenosis. Tricuspid Valve: The tricuspid valve is normal in structure. Tricuspid valve regurgitation is trivial. No evidence of  tricuspid stenosis. Aortic Valve: The aortic valve is normal in structure. Aortic valve regurgitation is not visualized. No aortic stenosis is present. Pulmonic Valve: The pulmonic valve was normal in structure. Pulmonic valve regurgitation is not visualized. No evidence of pulmonic stenosis. Aorta: The aortic root is normal in size and structure. Venous: The inferior vena cava is normal in size with greater than 50% respiratory variability, suggesting right atrial pressure of 3 mmHg. IAS/Shunts: No atrial level shunt detected by color flow Doppler.  LEFT VENTRICLE PLAX 2D LVIDd:         4.70 cm LVIDs:         3.40 cm LV PW:         1.10 cm LV IVS:        1.40 cm LVOT diam:     2.20 cm LVOT Area:     3.80 cm  RIGHT VENTRICLE            IVC RV Basal diam:  2.80 cm    IVC diam: 1.70 cm RV S prime:     9.90 cm/s TAPSE (M-mode): 1.7 cm LEFT ATRIUM             Index        RIGHT ATRIUM           Index LA diam:        3.80 cm 1.78 cm/m   RA Area:     14.90 cm LA Vol (A2C):   39.1 ml 18.31 ml/m  RA Volume:   34.30 ml  16.06 ml/m LA Vol (A4C):   33.7 ml 15.78 ml/m LA Biplane Vol: 38.9 ml 18.22 ml/m   AORTA Ao Root diam: 3.40 cm Ao Asc diam:  3.40 cm TRICUSPID VALVE TR Peak grad:   22.7 mmHg TR Vmax:        238.00 cm/s  SHUNTS Systemic Diam: 2.20 cm Dorothye Gathers MD Electronically signed by Dorothye Gathers MD Signature Date/Time: 10/06/2023/4:52:59 PM    Final    DG Chest Port 1 View Result Date: 10/05/2023 CLINICAL DATA:  Syncope. EXAM: PORTABLE CHEST 1 VIEW COMPARISON:  Chest radiograph dated 12/28/2013 FINDINGS: No focal consolidation, pleural effusion, pneumothorax. The cardiac silhouette is within limits. No acute osseous pathology. IMPRESSION: No active disease. Electronically Signed   By: Angus Bark M.D.   On: 10/05/2023 18:03   CT HEAD WO CONTRAST ( ) Result Date: 10/05/2023 CLINICAL DATA:  Mental status change, unknown cause Syncope/presyncope, cerebrovascular cause suspected4 EXAM: CT HEAD WITHOUT  CONTRAST TECHNIQUE: Contiguous axial images were obtained from the base of the skull through the vertex without intravenous contrast. RADIATION DOSE REDUCTION: This exam was performed according to the departmental dose-optimization program which includes automated exposure control, adjustment of the mA and/or kV according to patient size and/or use of iterative reconstruction technique. COMPARISON:  None Available. FINDINGS: Brain: No intracranial hemorrhage, mass effect, or midline shift. No hydrocephalus. Age related atrophy. The basilar cisterns are patent. Moderate periventricular and deep white matter hypodensity typical of chronic small vessel ischemia. No evidence of territorial infarct or acute ischemia. No extra-axial or intracranial fluid collection. Vascular: Atherosclerosis of skull base vasculature. Slight generalized increase of intracranial vasculature, but no discrete hyperdense vessel. Skull: No fracture or focal lesion. Sinuses/Orbits: Paranasal sinuses and mastoid air cells are clear. The visualized orbits are unremarkable. Other: None. IMPRESSION: 1. No acute intracranial abnormality. 2. Age related atrophy and chronic small vessel ischemia. Electronically Signed   By: Chadwick Colonel M.D.   On: 10/05/2023 17:08    Disposition Pt is being discharged home today in good condition. He requested transfer from Dr. Berry Bristol at [facility] office to [facility] office due to location. Follow up arranged based on these preferences.  Follow-up Plans & Appointments  Follow-up Information     Sheryle Donning, MD. Go on 11/03/2023.   Specialty: Cardiology Why: Please follow up with Dr. Veryl Gottron on 11/03/23 at 10:40 AM at the [facility] office at [facility], second floor Contact information: [address] [place] [address] [phone]         Transesophageal Echo and Cardioversion. Go on 10/14/2023.   Why: Come to [facility], Main Entrance, for  TEE-CV on 10/14/23.  Your Procedured is at 12 PM, Please arrive by 11 AM               Discharge Instructions     Diet - low sodium heart healthy   Complete by: As directed    Increase activity slowly   Complete by: As directed        Discharge Medications Allergies as of 10/07/2023       Reactions   Penicillins Swelling   Tolerated Ancef  12/01/22   Ambien  [zolpidem  Tartrate] Other (See Comments)   Causes hallucinations.    Codeine Other (See Comments)   hallucinations    Lisinopril  Cough        Medication List     STOP taking these medications    aspirin  EC 81 MG tablet       TAKE these medications    ascorbic acid  500 MG tablet Commonly known as: VITAMIN C  Take 500 mg by mouth in the morning.   atorvastatin  10 MG tablet Commonly known as: LIPITOR TAKE 1 TABLET DAILY What changed: when to take this   azelastine  0.1 % nasal spray Commonly known as: ASTELIN  Place 1 spray into both nostrils as needed for rhinitis or allergies. Use in each nostril as directed   cholecalciferol  1000 units tablet Commonly known as: VITAMIN D Take 1,000 Units by mouth at bedtime.   clindamycin  150 MG capsule Commonly known as:  CLEOCIN  Take 600 mg by mouth as needed (for teeth cleaning).   Coenzyme Q10 100 MG capsule Take 100 mg by mouth at bedtime.   fexofenadine 180 MG tablet Commonly known as: ALLEGRA Take 180 mg by mouth in the morning.   Fish Oil 1200 MG Caps Take 2 capsules by mouth at bedtime.   magnesium  gluconate 500 MG tablet Commonly known as: MAGONATE Take 500 mg by mouth 2 (two) times daily.   metoprolol  tartrate 25 MG tablet Commonly known as: LOPRESSOR  Take 25 mg by mouth 2 (two) times daily.   multivitamin with minerals Tabs tablet Take 1 tablet by mouth in the morning.   nitroGLYCERIN  0.4 MG SL tablet Commonly known as: NITROSTAT  Place 1 tablet (0.4 mg total) under the tongue every 5 (five) minutes as needed for chest pain.    rivaroxaban  20 MG Tabs tablet Commonly known as: XARELTO  Take 1 tablet (20 mg total) by mouth daily with supper.   telmisartan  80 MG tablet Commonly known as: MICARDIS  Take 1 tablet (80 mg total) by mouth every evening. Do not restart taking at home until blood pressure is consistently more than 140/90 What changed: additional instructions         Outstanding Labs/Studies None  Duration of Discharge Encounter: APP Time: 20 minutes Physican time: 45 minutes   Signed, Sheryle Donning, MD 10/07/2023, 10:22 AM

## 2023-10-10 NOTE — H&P (Signed)
 Please see consult note on 10/05/2023  Donald Gibbs, MD MS

## 2023-10-13 NOTE — Progress Notes (Signed)
 Pt called for pre procedure instructions. Arrival time 1100 NPO after midnight explained Instructed to take am meds with sip of water  and confirmed blood thinner consistency, xarelto . Instructed pt need for ride home tomorrow and have responsible adult with them for 24 hrs post procedure.

## 2023-10-14 ENCOUNTER — Other Ambulatory Visit: Payer: Self-pay

## 2023-10-14 ENCOUNTER — Encounter (HOSPITAL_COMMUNITY)
Admission: RE | Disposition: A | Discharge status: Home / Self Care | Payer: Self-pay | Source: Home / Self Care | Attending: Cardiology

## 2023-10-14 ENCOUNTER — Encounter (HOSPITAL_COMMUNITY): Payer: Self-pay | Admitting: Cardiology

## 2023-10-14 ENCOUNTER — Ambulatory Visit (HOSPITAL_COMMUNITY): Admitting: Anesthesiology

## 2023-10-14 ENCOUNTER — Ambulatory Visit (HOSPITAL_COMMUNITY)
Admission: RE | Admit: 2023-10-14 | Discharge: 2023-10-14 | Disposition: A | Discharge status: Home / Self Care | Attending: Cardiology | Admitting: Cardiology

## 2023-10-14 ENCOUNTER — Ambulatory Visit (HOSPITAL_COMMUNITY)

## 2023-10-14 DIAGNOSIS — I480 Paroxysmal atrial fibrillation: Secondary | ICD-10-CM | POA: Diagnosis not present

## 2023-10-14 DIAGNOSIS — R55 Syncope and collapse: Secondary | ICD-10-CM | POA: Diagnosis not present

## 2023-10-14 DIAGNOSIS — Z955 Presence of coronary angioplasty implant and graft: Secondary | ICD-10-CM | POA: Diagnosis not present

## 2023-10-14 DIAGNOSIS — Z7901 Long term (current) use of anticoagulants: Secondary | ICD-10-CM | POA: Insufficient documentation

## 2023-10-14 DIAGNOSIS — I25119 Atherosclerotic heart disease of native coronary artery with unspecified angina pectoris: Secondary | ICD-10-CM | POA: Diagnosis not present

## 2023-10-14 DIAGNOSIS — I252 Old myocardial infarction: Secondary | ICD-10-CM | POA: Insufficient documentation

## 2023-10-14 DIAGNOSIS — I10 Essential (primary) hypertension: Secondary | ICD-10-CM | POA: Insufficient documentation

## 2023-10-14 DIAGNOSIS — I2510 Atherosclerotic heart disease of native coronary artery without angina pectoris: Secondary | ICD-10-CM | POA: Insufficient documentation

## 2023-10-14 DIAGNOSIS — I340 Nonrheumatic mitral (valve) insufficiency: Secondary | ICD-10-CM

## 2023-10-14 DIAGNOSIS — Z87891 Personal history of nicotine dependence: Secondary | ICD-10-CM | POA: Diagnosis not present

## 2023-10-14 DIAGNOSIS — Z79899 Other long term (current) drug therapy: Secondary | ICD-10-CM | POA: Diagnosis not present

## 2023-10-14 DIAGNOSIS — E119 Type 2 diabetes mellitus without complications: Secondary | ICD-10-CM

## 2023-10-14 DIAGNOSIS — I4891 Unspecified atrial fibrillation: Secondary | ICD-10-CM

## 2023-10-14 HISTORY — PX: CARDIOVERSION: EP1203

## 2023-10-14 HISTORY — PX: TRANSESOPHAGEAL ECHOCARDIOGRAM (CATH LAB): EP1270

## 2023-10-14 LAB — ECHO TEE

## 2023-10-14 SURGERY — TRANSESOPHAGEAL ECHOCARDIOGRAM (TEE) (CATHLAB)
Anesthesia: General

## 2023-10-14 MED ORDER — PROPOFOL 10 MG/ML IV BOLUS: INTRAVENOUS | Status: DC | PRN

## 2023-10-14 MED ORDER — PHENYLEPHRINE 80 MCG/ML (10ML) SYRINGE FOR IV PUSH (FOR BLOOD PRESSURE SUPPORT)
PREFILLED_SYRINGE | INTRAVENOUS | Status: DC | PRN
Start: 2023-10-14 — End: 2023-10-14
  Administered 2023-10-14: 160 ug via INTRAVENOUS

## 2023-10-14 MED ORDER — LIDOCAINE 2% (20 MG/ML) 5 ML SYRINGE
INTRAMUSCULAR | Status: DC | PRN
  Administered 2023-10-14: 80 mg via INTRAVENOUS

## 2023-10-14 MED ORDER — PROPOFOL 500 MG/50ML IV EMUL
INTRAVENOUS | Status: DC | PRN
  Administered 2023-10-14: 125 ug/kg/min via INTRAVENOUS

## 2023-10-14 MED ORDER — PROPOFOL 10 MG/ML IV BOLUS
INTRAVENOUS | Status: DC | PRN
  Administered 2023-10-14: 50 mg via INTRAVENOUS

## 2023-10-14 MED ORDER — SODIUM CHLORIDE 0.9 % IV SOLN
INTRAVENOUS | Status: DC | PRN
Start: 2023-10-14 — End: 2023-10-14

## 2023-10-14 SURGICAL SUPPLY — 1 items: PAD DEFIB RADIO PHYSIO CONN (PAD) ×1 IMPLANT

## 2023-10-14 NOTE — Interval H&P Note (Signed)
 History and Physical Interval Note:  10/14/2023 10:58 AM  Donald Gray  has presented today for surgery, with the diagnosis of afib.  The various methods of treatment have been discussed with the patient and family. After consideration of risks, benefits and other options for treatment, the patient has consented to  Procedure(s): TRANSESOPHAGEAL ECHOCARDIOGRAM (N/A) CARDIOVERSION (N/A) as a surgical intervention.  The patient's history has been reviewed, patient examined, no change in status, stable for surgery.  I have reviewed the patient's chart and labs.  Questions were answered to the patient's satisfaction.     Aldridge Krzyzanowski Veryl Gottron

## 2023-10-14 NOTE — Anesthesia Preprocedure Evaluation (Addendum)
 Anesthesia Evaluation  Patient identified by MRN, date of birth, ID band Patient awake    Reviewed: Allergy & Precautions, NPO status , Patient's Chart, lab work & pertinent test results  Airway Mallampati: III  TM Distance: <3 FB Neck ROM: Full    Dental  (+) Teeth Intact, Dental Advisory Given   Pulmonary former smoker   breath sounds clear to auscultation       Cardiovascular hypertension, Pt. on home beta blockers and Pt. on medications + CAD, + Past MI and + Cardiac Stents  + dysrhythmias Atrial Fibrillation  Rhythm:Regular Rate:Normal     Neuro/Psych negative neurological ROS  negative psych ROS   GI/Hepatic Neg liver ROS,GERD  ,,  Endo/Other  diabetes, Type 2    Renal/GU Renal disease     Musculoskeletal  (+) Arthritis ,    Abdominal   Peds  Hematology  (+) Blood dyscrasia, anemia   Anesthesia Other Findings   Reproductive/Obstetrics                              Anesthesia Physical Anesthesia Plan  ASA: 3  Anesthesia Plan: General   Post-op Pain Management: Minimal or no pain anticipated   Induction: Intravenous  PONV Risk Score and Plan: 0 and Propofol  infusion and Ondansetron   Airway Management Planned: Natural Airway  Additional Equipment: None  Intra-op Plan:   Post-operative Plan:   Informed Consent: I have reviewed the patients History and Physical, chart, labs and discussed the procedure including the risks, benefits and alternatives for the proposed anesthesia with the patient or authorized representative who has indicated his/her understanding and acceptance.       Plan Discussed with: CRNA  Anesthesia Plan Comments:         Anesthesia Quick Evaluation

## 2023-10-14 NOTE — Transfer of Care (Signed)
 Immediate Anesthesia Transfer of Care Note  Patient: Donald Gray  Procedure(s) Performed: TRANSESOPHAGEAL ECHOCARDIOGRAM CARDIOVERSION  Patient Location: Cath Lab  Anesthesia Type:MAC and General  Level of Consciousness: sedated and responds to stimulation  Airway & Oxygen Therapy: Patient Spontanous Breathing and Patient connected to nasal cannula oxygen  Post-op Assessment: Report given to RN and Post -op Vital signs reviewed and stable  Post vital signs: Reviewed and stable  Last Vitals:  Vitals Value Taken Time  BP 93/74 10/14/23 12:05  Temp 36.8 C 10/14/23 12:00  Pulse 65 10/14/23 12:07  Resp 15 10/14/23 12:07  SpO2 97 % 10/14/23 12:07  Vitals shown include unfiled device data.  Last Pain:  Vitals:   10/14/23 1200  TempSrc: Temporal  PainSc: Asleep         Complications: There were no known notable events for this encounter.

## 2023-10-14 NOTE — Anesthesia Postprocedure Evaluation (Signed)
 Anesthesia Post Note  Patient: HUNTER BACHAR  Procedure(s) Performed: TRANSESOPHAGEAL ECHOCARDIOGRAM CARDIOVERSION     Patient location during evaluation: PACU Anesthesia Type: General Level of consciousness: awake and alert Pain management: pain level controlled Vital Signs Assessment: post-procedure vital signs reviewed and stable Respiratory status: spontaneous breathing, nonlabored ventilation, respiratory function stable and patient connected to nasal cannula oxygen Cardiovascular status: blood pressure returned to baseline and stable Postop Assessment: no apparent nausea or vomiting Anesthetic complications: no  There were no known notable events for this encounter.  Last Vitals:  Vitals:   10/14/23 1225 10/14/23 1230  BP: 115/74   Pulse: (!) 59 (!) 58  Resp: 13 13  Temp:    SpO2: 97% 96%    Last Pain:  Vitals:   10/14/23 1200  TempSrc: Temporal  PainSc: Asleep                 Willian Harrow

## 2023-10-14 NOTE — CV Procedure (Signed)
   TRANSESOPHAGEAL ECHOCARDIOGRAM GUIDED DIRECT CURRENT CARDIOVERSION  NAME:  Donald Gray   MRN: [id number] DOB:  [birth year]   ADMIT DATE: 10/14/2023  INDICATIONS: Symptomatic atrial fibrillation  PROCEDURE:   Informed consent was obtained prior to the procedure. The risks, benefits and alternatives for the procedure were discussed and the patient comprehended these risks.  Risks include, but are not limited to, cough, sore throat, vomiting, nausea, somnolence, esophageal and stomach trauma or perforation, bleeding, low blood pressure, aspiration, pneumonia, infection, trauma to the teeth and death.    After a procedural time-out, the patient was sedated by the anesthesia service. The transesophageal probe was inserted in the esophagus and stomach without difficulty and multiple views were obtained. Anesthesia was monitored by Dr. Otis Blocker. Patient received 80 mg IV lidocaine  and 166.79 mg IV propofol .  COMPLICATIONS:    Complications: No complications Patient tolerated procedure well.  FINDINGS:  LEFT VENTRICLE: EF = 60-65%. No regional wall motion abnormalities.  RIGHT VENTRICLE: Normal size and function.   LEFT ATRIUM: No thrombus/mass.  LEFT ATRIAL APPENDAGE: No thrombus/mass.   RIGHT ATRIUM: No thrombus/mass.  AORTIC VALVE:  Trileaflet. No significant regurgitation. No vegetation.  MITRAL VALVE:    Normal structure. Mild regurgitation. No vegetation.  TRICUSPID VALVE: Normal structure. Mild regurgitation. No vegetation.  PULMONIC VALVE: Grossly normal structure. Trivial regurgitation. No apparent vegetation.  INTERATRIAL SEPTUM: No PFO or ASD seen by color Doppler. Negative bubble study.  PERICARDIUM: No effusion noted.  DESCENDING AORTA: Moderate diffuse plaque seen   CARDIOVERSION:     Indications:  Symptomatic Atrial Fibrillation  Procedure Details:  Once the TEE was complete, the patient had the defibrillator pads placed in the anterior and posterior  position. Once an appropriate level of sedation was confirmed, the patient was cardioverted x 2 with 200, 300J of biphasic synchronized energy.  The patient converted to NSR.  There were no apparent complications.  The patient had normal neuro status and respiratory status post procedure with vitals stable as recorded elsewhere.  Adequate airway was maintained throughout and vital signs monitored per protocol.  Sheryle Donning, MD, PhD, [facility]  [facility]  [place]  [facility] at [facility] at [facility] [address] [place], [place] [address] [phone]   11:52 AM

## 2023-10-15 ENCOUNTER — Encounter (HOSPITAL_COMMUNITY): Payer: Self-pay | Admitting: Cardiology

## 2023-10-20 DIAGNOSIS — N1831 Chronic kidney disease, stage 3a: Secondary | ICD-10-CM | POA: Diagnosis not present

## 2023-10-20 DIAGNOSIS — Z85528 Personal history of other malignant neoplasm of kidney: Secondary | ICD-10-CM | POA: Diagnosis not present

## 2023-10-20 DIAGNOSIS — I10 Essential (primary) hypertension: Secondary | ICD-10-CM | POA: Diagnosis not present

## 2023-10-20 DIAGNOSIS — E1142 Type 2 diabetes mellitus with diabetic polyneuropathy: Secondary | ICD-10-CM | POA: Diagnosis not present

## 2023-10-20 DIAGNOSIS — R55 Syncope and collapse: Secondary | ICD-10-CM | POA: Diagnosis not present

## 2023-10-20 DIAGNOSIS — G609 Hereditary and idiopathic neuropathy, unspecified: Secondary | ICD-10-CM | POA: Diagnosis not present

## 2023-10-20 DIAGNOSIS — I2510 Atherosclerotic heart disease of native coronary artery without angina pectoris: Secondary | ICD-10-CM | POA: Diagnosis not present

## 2023-10-20 DIAGNOSIS — I4891 Unspecified atrial fibrillation: Secondary | ICD-10-CM | POA: Diagnosis not present

## 2023-10-20 DIAGNOSIS — Z711 Person with feared health complaint in whom no diagnosis is made: Secondary | ICD-10-CM | POA: Diagnosis not present

## 2023-10-20 DIAGNOSIS — E1122 Type 2 diabetes mellitus with diabetic chronic kidney disease: Secondary | ICD-10-CM | POA: Diagnosis not present

## 2023-10-20 DIAGNOSIS — R2681 Unsteadiness on feet: Secondary | ICD-10-CM | POA: Diagnosis not present

## 2023-11-03 ENCOUNTER — Ambulatory Visit (HOSPITAL_BASED_OUTPATIENT_CLINIC_OR_DEPARTMENT_OTHER): Admitting: Cardiology

## 2023-11-03 ENCOUNTER — Encounter (HOSPITAL_BASED_OUTPATIENT_CLINIC_OR_DEPARTMENT_OTHER): Payer: Self-pay | Admitting: Cardiology

## 2023-11-03 VITALS — BP 132/70 | HR 62 | Ht 71.0 in | Wt 203.0 lb

## 2023-11-03 DIAGNOSIS — I2510 Atherosclerotic heart disease of native coronary artery without angina pectoris: Secondary | ICD-10-CM | POA: Diagnosis not present

## 2023-11-03 DIAGNOSIS — I480 Paroxysmal atrial fibrillation: Secondary | ICD-10-CM

## 2023-11-03 DIAGNOSIS — D6869 Other thrombophilia: Secondary | ICD-10-CM | POA: Diagnosis not present

## 2023-11-03 DIAGNOSIS — E782 Mixed hyperlipidemia: Secondary | ICD-10-CM

## 2023-11-03 DIAGNOSIS — Z7901 Long term (current) use of anticoagulants: Secondary | ICD-10-CM | POA: Diagnosis not present

## 2023-11-03 DIAGNOSIS — I10 Essential (primary) hypertension: Secondary | ICD-10-CM

## 2023-11-03 MED ORDER — RIVAROXABAN 20 MG PO TABS: 20.0000 mg | ORAL_TABLET | Freq: Every day | ORAL | 3 refills | Status: DC

## 2023-11-03 MED ORDER — TELMISARTAN 80 MG PO TABS: 80.0000 mg | ORAL_TABLET | Freq: Every evening | ORAL | 3 refills | Status: AC

## 2023-11-03 MED ORDER — NITROGLYCERIN 0.4 MG SL SUBL: 0.4000 mg | SUBLINGUAL_TABLET | SUBLINGUAL | 3 refills | Status: AC | PRN

## 2023-11-03 MED ORDER — ATORVASTATIN CALCIUM 10 MG PO TABS: 10.0000 mg | ORAL_TABLET | Freq: Every day | ORAL | 3 refills | Status: AC

## 2023-11-03 NOTE — Patient Instructions (Signed)
 Medication Instructions:  Your physician recommends that you continue on your current medications as directed. Please refer to the Current Medication list given to you today.  *If you need a refill on your cardiac medications before your next appointment, please call your pharmacy*  Lab Work: NONE  Testing/Procedures: NONE  Follow-Up: At [facility], you and your health needs are our priority.  As part of our continuing mission to provide you with exceptional heart care, we have created designated Provider Care Teams.  These Care Teams include your primary Cardiologist (physician) and Advanced Practice Providers (APPs -  Physician Assistants and Nurse Practitioners) who all work together to provide you with the care you need, when you need it.  We recommend signing up for the patient portal called MyChart.  Sign up information is provided on this After Visit Summary.  MyChart is used to connect with patients for Virtual Visits (Telemedicine).  Patients are able to view lab/test results, encounter notes, upcoming appointments, etc.  Non-urgent messages can be sent to your provider as well.   To learn more about what you can do with MyChart, go to [email or web address].    Your next appointment:   6 month(s)  The format for your next appointment:   In Person  Provider:   Shelda Bruckner MD, Reche ORN NP, or Rosaline RAMAN NP

## 2023-11-03 NOTE — Progress Notes (Signed)
 Cardiology Office Note:  .   Date:  11/03/2023  ID:  Donald Gray, DOB April 24, 1941, MRN [id number] PCP: Charlott Dorn LABOR, MD  [facility] Providers Cardiologist:  Shelda Bruckner, MD {  History of Present Illness: Donald Gray is a 83 y.o. male with PMH CAD s/p RCA stent 1995, paroxysmal atrial fibrillation, hypertension, hypercholesterolemia, type II diabetes, history of renal cancer s/p R nephrectomy and immunitherapy. He was previously followed by Drs. Claudene Reek, Renfrow, and I met him during his hospitalization 10/2023.  Pertinent CV history: Syncope 10/05/23, unclear etiology (poor oral intake, felt poorly prior, witnessed LOC, had loss of bladder function, no cardiac symptoms before/after). Presented to ER, noted to be in afib. Had history of remote afib but no episodes in years, so had been off anticoagulation.   Today: Here with his wife. Heart rate goes to 90 with activity. Sometimes feels short of breath, nonlimiting. Wife notes that he yawns a lot. Has a tracker that states he doesn't have sleep apnea. Doesn't feel tired.   Restarted telmisartan  3 days ago after BP had been 140/90 for few days. No lightheadedness.   No bleeding on xarelto . Subconjunctival hemorrhage has resolved. His watch has not noted afib but his omron cuff has noted possible AF.  ECG shows NSR.  Had sore throat, skin discomfort after TEE/CV for a few days.   Plans to return to gym. Was lifting weights, walking on the treadmill before hospitalization.   ROS: Denies chest pain, shortness of breath at rest or with normal exertion. No PND, orthopnea, LE edema or unexpected weight gain. No syncope or palpitations. ROS otherwise negative except as noted.   Studies Reviewed: SABRA    EKG:  EKG Interpretation Date/Time:  Thursday November 03 2023 10:39:53 EDT Ventricular Rate:  62 PR Interval:  186 QRS Duration:  78 QT Interval:  414 QTC Calculation: 420 R Axis:   61  Text  Interpretation: Normal sinus rhythm Normal ECG Confirmed by Bruckner Shelda [phone] on 11/03/2023 10:47:54 AM    Physical Exam:   VS:  BP 132/70 (BP Location: Left Arm, Patient Position: Sitting, Cuff Size: Normal)   Pulse 62   Ht 5' 11 (1.803 m)   Wt 203 lb (92.1 kg)   SpO2 98%   BMI 28.31 kg/m    Wt Readings from Last 3 Encounters:  11/03/23 203 lb (92.1 kg)  10/14/23 202 lb (91.6 kg)  10/06/23 202 lb (91.6 kg)    GEN: Well nourished, well developed in no acute distress HEENT: Normal, moist mucous membranes NECK: No JVD CARDIAC: regular rhythm, normal S1 and S2, no rubs or gallops. No murmur. VASCULAR: Radial and DP pulses 2+ bilaterally. No carotid bruits RESPIRATORY:  Clear to auscultation without rales, wheezing or rhonchi  ABDOMEN: Soft, non-tender, non-distended MUSCULOSKELETAL:  Ambulates independently SKIN: Warm and dry, no edema NEUROLOGIC:  Alert and oriented x 3. No focal neuro deficits noted. PSYCHIATRIC:  Normal affect    ASSESSMENT AND PLAN: .    Paroxysmal atrial fibrillation Secondary hypercoagulable state -admitted 10/2023 after syncope, afib with intermittent RVR. Started back on Xarelto  -continued on metoprolol  tartrate 25 mg BID; noted to have HR in the 60s with occasional pauses (all less than 2 sec), but then also had intermittent RVR to the 130s. Asymptomatic with both -s/p TEE-CV 10/14/23 -in NSR today. -CHA2DS2/VAS Stroke Risk Points=5 (HTN, DM, agex2, CAD) -continue rivaroxaban    -smartwatch detects sleep apnea, he notes that this has not been  found. Discussed sleep study today. After shared decision making, will defer unless apnea detected on his smart watch.  CAD s/p remote RCA stent Hyperlipidemia, mixed -aspirin  stopped with start of DOAC -continue atorvastatin  -lipids per [facility] 06/30/23: Tchol 151, HDL 40, LDL 71, TG 249 -discussed lifestyle management of TG. He was recommended meds for this in the past but declined. Discussed diet,  exercise, specifically avoiding simple starches/simple sugars. -refilled nitroglycerin , statin -reviewed red flag warning signs that need immediate medical attention  Hypertension -BP initially low on presentation to ER, telmisartan  held. Instructed to restart as an outpatient when BP consistently >140/90. Restarted 3 days ago, BP doing well.  Type II diabetes -diet controlled, last A1c 6.4  History of metastatic RCC s/p nephrectomy and immunotherapy -with peripheral neuropathy after immunotherapy  CV risk counseling and prevention -recommend heart healthy/Mediterranean diet, with whole grains, fruits, vegetable, fish, lean meats, nuts, and olive oil. Limit salt. -recommend moderate walking, 3-5 times/week for 30-50 minutes each session. Aim for at least 150 minutes.week. Goal should be pace of 3 miles/hours, or walking 1.5 miles in 30 minutes -recommend avoidance of tobacco products. Avoid excess alcohol .  Dispo: 6 months or sooner as needed  Total time of encounter: I spent 51 minutes dedicated to the care of this patient on the date of this encounter to include pre-visit review of records, face-to-face time with the patient discussing conditions above, and clinical documentation with the electronic health record. We specifically spent time today discussing afib, medications, symptoms to watch for, risk factors/management of afib.   Signed, Shelda Bruckner, MD   Shelda Bruckner, MD, PhD, [facility]  [facility]  [place]  [facility] at [facility] at [facility] [address] [place], [place] [address] [phone]

## 2023-12-22 DIAGNOSIS — C649 Malignant neoplasm of unspecified kidney, except renal pelvis: Secondary | ICD-10-CM | POA: Diagnosis not present

## 2024-01-04 ENCOUNTER — Telehealth: Payer: Self-pay | Admitting: Nurse Practitioner

## 2024-01-04 NOTE — Telephone Encounter (Signed)
 Pt c/o BP issue: STAT if pt c/o blurred vision, one-sided weakness or slurred speech.  STAT if BP is GREATER than 180/120 TODAY.  STAT if BP is LESS than 90/60 and SYMPTOMATIC TODAY  1. What is your BP concern? Fluctuating   2. Have you taken any BP medication today?yes  3. What are your last 5 BP readings? 77/49, 144/76, 154/86, 149/75, 131/75  4. Are you having any other symptoms (ex. Dizziness, headache, blurred vision, passed out)? no

## 2024-01-04 NOTE — Telephone Encounter (Signed)
 Left message to call back.

## 2024-01-05 ENCOUNTER — Encounter (HOSPITAL_BASED_OUTPATIENT_CLINIC_OR_DEPARTMENT_OTHER): Payer: Self-pay | Admitting: Family

## 2024-01-05 ENCOUNTER — Ambulatory Visit (HOSPITAL_BASED_OUTPATIENT_CLINIC_OR_DEPARTMENT_OTHER): Admitting: Family

## 2024-01-05 VITALS — BP 138/84 | HR 71 | Ht 71.0 in | Wt 202.8 lb

## 2024-01-05 DIAGNOSIS — R400 Somnolence: Secondary | ICD-10-CM

## 2024-01-05 DIAGNOSIS — I480 Paroxysmal atrial fibrillation: Secondary | ICD-10-CM | POA: Diagnosis not present

## 2024-01-05 DIAGNOSIS — R0683 Snoring: Secondary | ICD-10-CM

## 2024-01-05 DIAGNOSIS — D6859 Other primary thrombophilia: Secondary | ICD-10-CM | POA: Diagnosis not present

## 2024-01-05 DIAGNOSIS — I10 Essential (primary) hypertension: Secondary | ICD-10-CM

## 2024-01-05 NOTE — Progress Notes (Signed)
 Cardiology Office Note   Date:  01/05/2024  ID:  Donald Gray, DOB [birth year], MRN [id number] PCP: Donald Gray LABOR, MD  [facility] Providers Cardiologist:  Donald Bruckner, MD     History of Present Illness Donald Gray is a 83 y.o. male with hx of CAD s/p RCA stent 1994, PAF, HTN, HLD, DM2, renal cancer s/p R nephrectomy and immunotherapy. Previously followed by Donald Gray, Donald Gray, Donald Gray and now established with Dr. Bruckner.   He had syncope 10/05/23 in setting of poor oral intake. At ED noted to be in atrial fibrillation. Had not recurred in many years and previously OAC discontinued. TEE cardioversion 10/14/23.  Last seen 73/25 by Donald Gray. He had recently resumed Telmisartan  after elevated BP. EKG NSR. He was advised to continue Metoprolol  Tartrate 25mg  BID, Xarelto  20mg  daily. No sleep apnea by smart Watch. He had no anginal symptoms. ASA stopped due to OAC.   Visit with urology 12/22/23 BP 115/61.   Presents today for follow up after contacting office with elevated BP. His home cuffs were checked for accuracy, new cuff found to read ~20 points high. His old cuff was found to read accurately.   Was at dentist yesterday with more significant numbing shot, carbocaine, and removal of 1 tooth. BP 120/70s prior to dental procedure. After procedure as low as 72/50  associated with lightheadedness and had to get 500cc of normal saline and 250 of D5. He has not had hypotension at home. Prior to dentist had  bowl of cornflakes but not much to drink.   Heart rate without metoprolol  80-90 and after Metoprolol  usually 60-70. No lightheadedness, dizziness. No chest pain, exertional dyspnea.  Interested in sleep study. Wife notes he has an odd way of breathing at night and some daytime somnolence.   ROS: Please see the history of present illness.    All other systems reviewed and are negative.   Studies Reviewed      Cardiac Studies &  Procedures   ______________________________________________________________________________________________   STRESS TESTS  MYOCARDIAL PERFUSION IMAGING 07/29/2015  Interpretation Summary  Nuclear stress EF: 54%.  There was no ST segment deviation noted during stress.  Defect 1: There is a medium defect of severe severity present in the basal inferior, mid inferior, apical inferior and apex location.  Findings consistent with prior myocardial infarction with a small amount of peri-infarct ischemia.  This is a low risk study.  The left ventricular ejection fraction is mildly decreased (45-54%).   ECHOCARDIOGRAM  ECHOCARDIOGRAM COMPLETE 10/06/2023  Narrative ECHOCARDIOGRAM REPORT    Patient Name:   Donald Gray Date of Exam: 10/06/2023 Medical Rec #:  [id number]           Height:       71.0 in Accession #:    [id number]          Weight:       206.0 lb Date of Birth:  [birth year]           BSA:          2.135 m Patient Age:    83 years            BP:           101/58 mmHg Patient Gender: M                   HR:           73 bpm. Exam Location:  Inpatient  Procedure: 2D  Echo (Both Spectral and Color Flow Doppler were utilized during procedure).  Indications:    syncope  History:        Patient has prior history of Echocardiogram examinations, most recent 12/15/2021. CAD, Arrythmias:Atrial Fibrillation, Signs/Symptoms:Dyspnea; Risk Factors:Diabetes and Dyslipidemia.  Sonographer:    Donald Gray RDCS Referring Phys: [phone] Donald Gray  IMPRESSIONS   1. Left ventricular ejection fraction, by estimation, is 60 to 65%. The left ventricle has normal function. The left ventricle has no regional wall motion abnormalities. There is mild left ventricular hypertrophy of the basal-septal segment. Left ventricular diastolic parameters are indeterminate. 2. Right ventricular systolic function is normal. The right ventricular size is normal. There is normal  pulmonary artery systolic pressure. 3. The mitral valve is normal in structure. No evidence of mitral valve regurgitation. No evidence of mitral stenosis. 4. The aortic valve is normal in structure. Aortic valve regurgitation is not visualized. No aortic stenosis is present. 5. The inferior vena cava is normal in size with greater than 50% respiratory variability, suggesting right atrial pressure of 3 mmHg.  FINDINGS Left Ventricle: Left ventricular ejection fraction, by estimation, is 60 to 65%. The left ventricle has normal function. The left ventricle has no regional wall motion abnormalities. The left ventricular internal cavity size was normal in size. There is mild left ventricular hypertrophy of the basal-septal segment. Left ventricular diastolic parameters are indeterminate.  Right Ventricle: The right ventricular size is normal. No increase in right ventricular wall thickness. Right ventricular systolic function is normal. There is normal pulmonary artery systolic pressure. The tricuspid regurgitant velocity is 2.38 m/s, and with an assumed right atrial pressure of 3 mmHg, the estimated right ventricular systolic pressure is 25.7 mmHg.  Left Atrium: Left atrial size was normal in size.  Right Atrium: Right atrial size was normal in size.  Pericardium: There is no evidence of pericardial effusion.  Mitral Valve: The mitral valve is normal in structure. No evidence of mitral valve regurgitation. No evidence of mitral valve stenosis.  Tricuspid Valve: The tricuspid valve is normal in structure. Tricuspid valve regurgitation is trivial. No evidence of tricuspid stenosis.  Aortic Valve: The aortic valve is normal in structure. Aortic valve regurgitation is not visualized. No aortic stenosis is present.  Pulmonic Valve: The pulmonic valve was normal in structure. Pulmonic valve regurgitation is not visualized. No evidence of pulmonic stenosis.  Aorta: The aortic root is normal in size  and structure.  Venous: The inferior vena cava is normal in size with greater than 50% respiratory variability, suggesting right atrial pressure of 3 mmHg.  IAS/Shunts: No atrial level shunt detected by color flow Doppler.   LEFT VENTRICLE PLAX 2D LVIDd:         4.70 cm LVIDs:         3.40 cm LV PW:         1.10 cm LV IVS:        1.40 cm LVOT diam:     2.20 cm LVOT Area:     3.80 cm   RIGHT VENTRICLE            IVC RV Basal diam:  2.80 cm    IVC diam: 1.70 cm RV S prime:     9.90 cm/s TAPSE (M-mode): 1.7 cm  LEFT ATRIUM             Index        RIGHT ATRIUM           Index LA diam:  3.80 cm 1.78 cm/m   RA Area:     14.90 cm LA Vol (A2C):   39.1 ml 18.31 ml/m  RA Volume:   34.30 ml  16.06 ml/m LA Vol (A4C):   33.7 ml 15.78 ml/m LA Biplane Vol: 38.9 ml 18.22 ml/m  AORTA Ao Root diam: 3.40 cm Ao Asc diam:  3.40 cm  TRICUSPID VALVE TR Peak grad:   22.7 mmHg TR Vmax:        238.00 cm/s  SHUNTS Systemic Diam: 2.20 cm  Oneil Parchment MD Electronically signed by Oneil Parchment MD Signature Date/Time: 10/06/2023/4:52:59 PM    Final   TEE  ECHO TEE 10/14/2023  Narrative TRANSESOPHOGEAL ECHO REPORT    Patient Name:   ADARIUS TIGGES Date of Exam: 10/14/2023 Medical Rec #:  [id number]           Height:       71.0 in Accession #:    [id number]          Weight:       202.0 lb Date of Birth:  06/27/1940           BSA:          2.117 m Patient Age:    83 years            BP:           165/104 mmHg Patient Gender: M                   HR:           103 bpm. Exam Location:  Inpatient  Procedure: Transesophageal Echo, 3D Echo, Color Doppler and Cardiac Doppler (Both Spectral and Color Flow Doppler were utilized during procedure).  Indications:     I48.91* Unspecified atrial fibrillation  History:         Patient has prior history of Echocardiogram examinations, most recent 10/06/2023. CAD, Arrythmias:Atrial Fibrillation; Risk Factors:Hypertension, Diabetes and  Dyslipidemia.  Sonographer:     Damien Senior RDCS Referring Phys:  [id number] Donald Gray Diagnosing Phys: Donald Bruckner MD  PROCEDURE: After discussion of the risks and benefits of a TEE, an informed consent was obtained from the patient. The transesophogeal probe was passed without difficulty through the esophogus of the patient. Sedation performed by different physician. The patient was monitored while under deep sedation. Anesthestetic sedation was provided intravenously by Anesthesiology: 167mg  of Propofol , 50mg  of Lidocaine . Image quality was good. The patient developed no complications during the procedure. A successful direct current cardioversion was performed at 200, 300 joules with 2 attempts.  IMPRESSIONS   1. Left ventricular ejection fraction, by estimation, is 60 to 65%. The left ventricle has normal function. 2. Right ventricular systolic function is normal. The right ventricular size is normal. 3. No left atrial/left atrial appendage thrombus was detected. The LAA emptying velocity was 24 cm/s. 4. The mitral valve is normal in structure. Mild mitral valve regurgitation. No evidence of mitral stenosis. 5. The aortic valve is tricuspid. Aortic valve regurgitation is not visualized. No aortic stenosis is present. 6. There is Moderate (Grade III) plaque involving the descending aorta. 7. Agitated saline contrast bubble study was negative, with no evidence of any interatrial shunt. 8. 3D performed of the mitral valve and demonstrates Normal structure of mitral valve, no flail/prolapse seen.  Conclusion(s)/Recommendation(s): No LA/LAA thrombus identified. Successful cardioversion performed with restoration of normal sinus rhythm.  FINDINGS Left Ventricle: Left ventricular ejection fraction, by estimation, is 60 to 65%. The left  ventricle has normal function. The left ventricular internal cavity size was normal in size.  Right Ventricle: The right ventricular size  is normal. No increase in right ventricular wall thickness. Right ventricular systolic function is normal.  Left Atrium: Left atrial size was normal in size. No left atrial/left atrial appendage thrombus was detected. The LAA emptying velocity was 24 cm/s.  Right Atrium: Right atrial size was normal in size.  Pericardium: There is no evidence of pericardial effusion.  Mitral Valve: The mitral valve is normal in structure. Mild mitral valve regurgitation. No evidence of mitral valve stenosis.  Tricuspid Valve: The tricuspid valve is normal in structure. Tricuspid valve regurgitation is mild . No evidence of tricuspid stenosis.  Aortic Valve: The aortic valve is tricuspid. Aortic valve regurgitation is not visualized. No aortic stenosis is present.  Pulmonic Valve: The pulmonic valve was normal in structure. Pulmonic valve regurgitation is trivial. No evidence of pulmonic stenosis.  Aorta: The aortic root and ascending aorta are structurally normal, with no evidence of dilitation. There is moderate (Grade III) plaque involving the descending aorta.  IAS/Shunts: No atrial level shunt detected by color flow Doppler. Agitated saline contrast was given intravenously to evaluate for intracardiac shunting. Agitated saline contrast bubble study was negative, with no evidence of any interatrial shunt.  Additional Comments: Spectral Doppler performed.  Donald Bruckner MD Electronically signed by Donald Bruckner MD Signature Date/Time: 10/14/2023/5:06:14 PM    Final  MONITORS  LONG TERM MONITOR (3-14 DAYS) 03/08/2022  Narrative   NSR with average HR 70 bpm   Four nonsustained SVT episodes with longest 24 seconds, and fastest 174 bpm.   PAC and PVC burden < 1%   No atrial fibrillation   Patch Wear Time:  13 days and 16 hours (2023-10-16T19:53:39-0400 to 2023-10-30T11:57:59-0400)  Patient had a min HR of 49 bpm, max HR of 174 bpm, and avg HR of 70 bpm. Predominant underlying  rhythm was Sinus Rhythm. 4 Supraventricular Tachycardia runs occurred, the run with the fastest interval lasting 4 beats with a max rate of 174 bpm, the longest lasting 24.3 secs with an avg rate of 105 bpm. Isolated SVEs were rare (<1.0%), SVE Couplets were rare (<1.0%), and SVE Triplets were rare (<1.0%). Isolated VEs were rare (<1.0%), VE Couplets were rare (<1.0%), and no VE Triplets were present. Ventricular Bigeminy was present.       ______________________________________________________________________________________________      Risk Assessment/Calculations  CHA2DS2-VASc Score = 6   This indicates a 9.7% annual risk of stroke. The patient's score is based upon: CHF History: 1 HTN History: 1 Diabetes History: 1 Stroke History: 0 Vascular Disease History: 1 Age Score: 2 Gender Score: 0            Physical Exam VS:  BP 120/76   Pulse 71   Ht 5' 11 (1.803 m)   Wt 202 lb 12.8 oz (92 kg)   SpO2 97%   BMI 28.28 kg/m  Vitals:   01/05/24 1445 01/05/24 1522  BP: 120/76 138/84  Pulse: 71   Height: 5' 11 (1.803 m)   Weight: 202 lb 12.8 oz (92 kg)   SpO2: 97%   BMI (Calculated): 28.3          Wt Readings from Last 3 Encounters:  01/05/24 202 lb 12.8 oz (92 kg)  11/03/23 203 lb (92.1 kg)  10/14/23 202 lb (91.6 kg)    GEN: Well nourished, well developed in no acute distress NECK: No JVD; No carotid bruits CARDIAC:  RRR, no murmurs, rubs, gallops RESPIRATORY:  Clear to auscultation without rales, wheezing or rhonchi  ABDOMEN: Soft, non-tender, non-distended EXTREMITIES:  No edema; No deformity   ASSESSMENT AND PLAN  PAF / Hypercoagulable state - RRR by ausculatation. CHA2DS2-VASc Score = 6 [CHF History: 1, HTN History: 1, Diabetes History: 1, Stroke History: 0, Vascular Disease History: 1, Age Score: 2, Gender Score: 0].  Therefore, the patient's annual risk of stroke is 9.7 %.    Continue metoprolol  tartrate 25mg  BID, Xarelto  20mg  daily. No bleeding  complications. Occasional bradycardia but as asymptomatic will not make changes at this time.  Daytime somnolence - wife notes he has unusual breathing at night. Reports daytime somnolence. Due to arrhythmia and symptoms of OSA, plan for [facility].  HTN - BP well controlled. Continue current antihypertensive regimen Telmisartan  80mg  daily, metoprolol  tartrate 25mg  BID. Suspect elevated readings at home are due to inaccurate BP cuff. He will inquire with [employer] about having it replaced. His old BP cuff is more accurate. He will monitor BP at home twice per week and will check in with MyChart in 1 week.       Dispo: follow up as scheduled with Dr. Lonni  Signed, Reche GORMAN Finder, NP

## 2024-01-05 NOTE — Telephone Encounter (Signed)
 Spoke with patient who stated that his blood pressure has been running high since he saw Dr Lonni last  Did have an episode when he had dental procedure where his HR dropped, had to have fluids in the office  Concerned about his blood pressure being elevated especially only having one kidney   Scheduled visit with Reche ORN NP this afternoon

## 2024-01-05 NOTE — Patient Instructions (Addendum)
 Medication Instructions:  Continue your current medications *If you need a refill on your cardiac medications before your next appointment, please call your pharmacy*  Follow-Up: At [facility], you and your health needs are our priority.  As part of our continuing mission to provide you with exceptional heart care, our providers are all part of one team.  This team includes your primary Cardiologist (physician) and Advanced Practice Providers or APPs (Physician Assistants and Nurse Practitioners) who all work together to provide you with the care you need, when you need it.  Your next appointment:   As scheduled with Dr. Lonni  We recommend signing up for the patient portal called MyChart.  Sign up information is provided on this After Visit Summary.  MyChart is used to connect with patients for Virtual Visits (Telemedicine).  Patients are able to view lab/test results, encounter notes, upcoming appointments, etc.  Non-urgent messages can be sent to your provider as well.   To learn more about what you can do with MyChart, go to [email or web address].   Other Instructions     Your physician has recommended that you have a ITAMAR sleep study. This test records several body functions during sleep, including: brain activity, eye movement, oxygen and carbon dioxide blood levels, heart rate and rhythm, breathing rate and rhythm, the flow of air through your mouth and nose, snoring, body muscle movements, and chest and belly movement.  THIS DEVICE WILL BE MAILED TO YOUR HOME AFTER THE PRE-CERTIFICATION IS COMPLETED THROUGH YOUR INSURANCE   Donald Gray Finder, NP will send you a MyChart message next week to check in on your blood pressure Your blood pressure today in clinic was 120/76 and 138/84  Tips to Measure your Blood Pressure Correctly  Here's what you can do to ensure a correct reading:  Don't drink a caffeinated beverage or smoke during the 30 minutes before the test.   Sit quietly for five minutes before the test begins.  During the measurement, sit in a chair with your feet on the floor and your arm supported so your elbow is at about heart level.  The inflatable part of the cuff should completely cover at least 80% of your upper arm, and the cuff should be placed on bare skin, not over a shirt.  Don't talk during the measurement.  Have your blood pressure measured twice, with a brief break in between. If the readings are different by 5 points or more, have it done a third time.   Blood pressure categories  Blood pressure category SYSTOLIC (upper number)  DIASTOLIC (lower number)  Normal Less than 120 mm Hg and Less than 80 mm Hg  Elevated 120-129 mm Hg and Less than 80 mm Hg  High blood pressure: Stage 1 hypertension 130-139 mm Hg or 80-89 mm Hg  High blood pressure: Stage 2 hypertension 140 mm Hg or higher or 90 mm Hg or higher  Hypertensive crisis (consult your doctor immediately) Higher than 180 mm Hg and/or Higher than 120 mm Hg  Source: American Heart Association and American Stroke Association. For more on getting your blood pressure under control, buy Controlling Your Blood Pressure, a Special Health Report from [facility].

## 2024-01-05 NOTE — Telephone Encounter (Signed)
 Patient is returning call.

## 2024-01-05 NOTE — Telephone Encounter (Signed)
Agree with plan for OV.    Loel Dubonnet, NP

## 2024-01-18 ENCOUNTER — Other Ambulatory Visit: Payer: Self-pay

## 2024-01-18 MED ORDER — RIVAROXABAN 20 MG PO TABS: 20.0000 mg | ORAL_TABLET | Freq: Every day | ORAL | 2 refills | Status: AC

## 2024-01-18 NOTE — Telephone Encounter (Signed)
 Received fax from [facility] to refill Xarelto .  Pt last saw Reche Finder, NP 01/05/24, last labs 10/06/23 Creat 1.05, age 83, weight 92kg, CrCl 69.37, based on CrCl pt is on appropriate dosage of Xarelto  20mg  every day for afib.  Will refill rx.

## 2024-02-20 ENCOUNTER — Encounter (HOSPITAL_BASED_OUTPATIENT_CLINIC_OR_DEPARTMENT_OTHER): Payer: Self-pay

## 2024-02-29 ENCOUNTER — Encounter (HOSPITAL_BASED_OUTPATIENT_CLINIC_OR_DEPARTMENT_OTHER): Payer: Self-pay | Admitting: Cardiology

## 2024-02-29 DIAGNOSIS — G4719 Other hypersomnia: Secondary | ICD-10-CM | POA: Diagnosis not present

## 2024-02-29 DIAGNOSIS — R5382 Chronic fatigue, unspecified: Secondary | ICD-10-CM | POA: Diagnosis not present

## 2024-02-29 DIAGNOSIS — R030 Elevated blood-pressure reading, without diagnosis of hypertension: Secondary | ICD-10-CM | POA: Diagnosis not present

## 2024-02-29 DIAGNOSIS — G4730 Sleep apnea, unspecified: Secondary | ICD-10-CM | POA: Diagnosis not present

## 2024-03-02 ENCOUNTER — Ambulatory Visit: Attending: Family

## 2024-03-02 DIAGNOSIS — R400 Somnolence: Secondary | ICD-10-CM

## 2024-03-02 DIAGNOSIS — I10 Essential (primary) hypertension: Secondary | ICD-10-CM

## 2024-03-02 DIAGNOSIS — D6859 Other primary thrombophilia: Secondary | ICD-10-CM

## 2024-03-02 DIAGNOSIS — I480 Paroxysmal atrial fibrillation: Secondary | ICD-10-CM

## 2024-03-02 NOTE — Procedures (Signed)
     Sleep Study Report Patient Information Name: Donald Gray  ID: [id number] Birth Date: [birth year]  Age: 83  Gender: Male BMI: 28.4 (W=203 lb, H=5' 11'') Study Date:10/29/12025 Referring Physician: Reche Finder, NP  TEST DESCRIPTION: Home sleep apnea testing was completed using the WatchPat, a Type 1 device, utilizing peripheral arterial tonometry (PAT), chest movement, actigraphy, pulse oximetry, pulse rate, body position and snore. AHI was calculated with apnea and hypopnea using valid sleep time as the denominator. RDI includes apneas, hypopneas, and RERAs. The data acquired and the scoring of sleep and all associated events were performed in accordance with the recommended standards and specifications as outlined in the AASM Manual for the Scoring of Sleep and Associated Events 2.2.0 (2015).  FINDINGS: 1. No evidence of Obstructive Sleep Apnea with AHI 2.3/hr. 2. No Central Sleep Apnea. 3. Oxygen desaturations as low as 91%. 4. Mild to moderate snoring was present. O2 sats were < 88% for 0 minutes. 5. Total sleep time was 7 hrs and 3 min. 6. 17.4% of total sleep time was spent in REM sleep. 7. Normal sleep onset latency at 23 min. 8. Normal REM sleep onset latency at 98 min. 9. Total awakenings were 14.  DIAGNOSIS: Normal study with no significant sleep disordered breathing.  Recommendations 1. Normal study with no significant sleep disordered breathing. 2. Healthy sleep recommendations include: adequate nightly sleep (normal 7-9 hrs/night), avoidance of caffeine after noon and alcohol  near bedtime, and maintaining a sleep environment that is cool, dark and quiet. 3. Weight loss for overweight patients is recommended. 4. Snoring recommendations include: weight loss where appropriate, side sleeping, and avoidance of alcohol  before bed. 5. Operation of motor vehicle or dangerous equipment must be avoided when feeling drowsy, excessively sleepy, or mentally  fatigued. 6. An ENT consultation which may be useful for specific causes of and possible treatment of bothersome snoring . 7. Weight loss may be of benefit in reducing the severity of snoring.   Report prepared by: Signature: Electronically Signed: Wilbert Bihari 03/02/2024 10:08:53 PM

## 2024-03-14 ENCOUNTER — Telehealth: Payer: Self-pay | Admitting: *Deleted

## 2024-03-14 NOTE — Telephone Encounter (Signed)
 The patient has been notified of the result via his mychart.

## 2024-03-14 NOTE — Telephone Encounter (Signed)
-----   Message from Wilbert Bihari sent at 03/02/2024 10:11 PM EDT ----- Please let patient know that sleep study showed no significant sleep apnea.

## 2024-04-06 ENCOUNTER — Encounter (HOSPITAL_BASED_OUTPATIENT_CLINIC_OR_DEPARTMENT_OTHER): Payer: Self-pay

## 2024-04-11 ENCOUNTER — Encounter (HOSPITAL_BASED_OUTPATIENT_CLINIC_OR_DEPARTMENT_OTHER): Payer: Self-pay | Admitting: Cardiology

## 2024-04-11 ENCOUNTER — Ambulatory Visit (HOSPITAL_BASED_OUTPATIENT_CLINIC_OR_DEPARTMENT_OTHER): Admitting: Cardiology

## 2024-04-11 VITALS — BP 138/70 | HR 67 | Ht 71.0 in | Wt 206.8 lb

## 2024-04-11 DIAGNOSIS — I480 Paroxysmal atrial fibrillation: Secondary | ICD-10-CM | POA: Diagnosis not present

## 2024-04-11 DIAGNOSIS — D6869 Other thrombophilia: Secondary | ICD-10-CM | POA: Diagnosis not present

## 2024-04-11 DIAGNOSIS — G620 Drug-induced polyneuropathy: Secondary | ICD-10-CM | POA: Diagnosis not present

## 2024-04-11 DIAGNOSIS — I2510 Atherosclerotic heart disease of native coronary artery without angina pectoris: Secondary | ICD-10-CM | POA: Diagnosis not present

## 2024-04-11 DIAGNOSIS — I10 Essential (primary) hypertension: Secondary | ICD-10-CM

## 2024-04-11 DIAGNOSIS — E782 Mixed hyperlipidemia: Secondary | ICD-10-CM | POA: Diagnosis not present

## 2024-04-11 DIAGNOSIS — Z7901 Long term (current) use of anticoagulants: Secondary | ICD-10-CM

## 2024-04-11 NOTE — Progress Notes (Signed)
 Cardiology Office Note:  .   Date:  04/11/2024  ID:  Donald Gray, DOB Nov 18, 1940, MRN [id number] PCP: Charlott Dorn LABOR, MD  [facility] Providers Cardiologist:  Shelda Bruckner, MD {  History of Present Illness: Donald Gray is a 83 y.o. male with PMH CAD s/p RCA stent 1995, paroxysmal atrial fibrillation, hypertension, hypercholesterolemia, type II diabetes, history of renal cancer s/p R nephrectomy and immunotherapy. He was previously followed by Drs. Claudene Reek, Manvel, and I met him during his hospitalization 10/2023.  Pertinent CV history: Syncope 10/05/23, unclear etiology (poor oral intake, felt poorly prior, witnessed LOC, had loss of bladder function, no cardiac symptoms before/after). Presented to ER, noted to be in afib. Had history of remote afib but no episodes in years, so had been off anticoagulation.   Today: Asking if he has amyloid. Has bilateral neuropathy that is worsening. Notes blood pressure is fluctuating but also has noted that his blood pressure cuff has been running higher than the measurements he is getting in the office. Hasn't noticed any afib. His brother and sister also have neuropathy, which is how the amyloid discussion came up. Diabetes has been historically well controlled, not felt to be high enough to have caused neuropathy. He did have chemotherapy with his renal cell cancer that can cause neuropathy.   Has mild shortness of breath occasionally on exertion but no swelling.   ROS: Denies chest pain, shortness of breath at rest. No PND, orthopnea, LE edema or unexpected weight gain. No syncope or palpitations. ROS otherwise negative except as noted.   Studies Reviewed: SABRA    EKG:       Physical Exam:   VS:  BP 138/70 (Cuff Size: Normal)   Pulse 67   Ht 5' 11 (1.803 m)   Wt 206 lb 12.8 oz (93.8 kg)   SpO2 97%   BMI 28.84 kg/m    Wt Readings from Last 3 Encounters:  04/11/24 206 lb 12.8 oz (93.8 kg)  01/05/24  202 lb 12.8 oz (92 kg)  11/03/23 203 lb (92.1 kg)    GEN: Well nourished, well developed in no acute distress HEENT: Normal, moist mucous membranes NECK: No JVD CARDIAC: regular rhythm, normal S1 and S2, no rubs or gallops. No murmur. VASCULAR: Radial and DP pulses 2+ bilaterally. No carotid bruits RESPIRATORY:  Clear to auscultation without rales, wheezing or rhonchi  ABDOMEN: Soft, non-tender, non-distended MUSCULOSKELETAL:  Ambulates independently SKIN: Warm and dry, no edema NEUROLOGIC:  Alert and oriented x 3. No focal neuro deficits noted. PSYCHIATRIC:  Normal affect    ASSESSMENT AND PLAN: .    Paroxysmal atrial fibrillation Secondary hypercoagulable state -admitted 10/2023 after syncope, afib with intermittent RVR. Started back on Xarelto  -continued on metoprolol  tartrate 25 mg BID; noted to have HR in the 60s with occasional pauses (all less than 2 sec), but then also had intermittent RVR to the 130s. Asymptomatic with both -s/p TEE-CV 10/14/23 -in NSR today. -CHA2DS2/VAS Stroke Risk Points=5 (HTN, DM, agex2, CAD) -continue rivaroxaban    -test showed he did not have sleep apnea  CAD s/p remote RCA stent Hyperlipidemia, mixed -aspirin  stopped with start of DOAC -continue atorvastatin  -lipids per [facility] 06/30/23: Tchol 151, HDL 40, LDL 71, TG 249 -discussed lifestyle management of TG. He was recommended meds for this in the past but declined. Discussed diet, exercise, specifically avoiding simple starches/simple sugars. -continue nitroglycerin , statin -reviewed red flag warning signs that need immediate medical attention  Hypertension -home  cuff has been reading higher than in the office, planning to replace cuff -continue telmisartan   Type II diabetes -diet controlled, last A1c 6.4. Has never been poorly controlled.  History of metastatic RCC s/p nephrectomy and immunotherapy -with peripheral neuropathy after immunotherapy. We did discuss amyloid and relationship to  heart and nerves today. We discussed, no heart failure symptoms concerning for clinical diastolic heart failure, but if these develop, would consider PYP for further evaluation  CV risk counseling and prevention -recommend heart healthy/Mediterranean diet, with whole grains, fruits, vegetable, fish, lean meats, nuts, and olive oil. Limit salt. -recommend moderate walking, 3-5 times/week for 30-50 minutes each session. Aim for at least 150 minutes.week. Goal should be pace of 3 miles/hours, or walking 1.5 miles in 30 minutes -recommend avoidance of tobacco products. Avoid excess alcohol .  Dispo: 6 months or sooner as needed  Signed, Shelda Bruckner, MD   Shelda Bruckner, MD, PhD, [facility]  [facility]  [place]  [facility] [address] [place], [place] [address] [phone]

## 2024-04-11 NOTE — Patient Instructions (Signed)

## 2024-04-23 DIAGNOSIS — J019 Acute sinusitis, unspecified: Secondary | ICD-10-CM | POA: Diagnosis not present
